# Patient Record
Sex: Male | Born: 1950 | Race: White | Hispanic: No | Marital: Single | Smoking: Former smoker
Health system: Southern US, Community
[De-identification: ages and names within clinical notes are randomized; demographics above are authoritative.]

## PROBLEM LIST (undated history)

## (undated) DIAGNOSIS — R351 Nocturia: Secondary | ICD-10-CM

## (undated) DIAGNOSIS — I712 Thoracic aortic aneurysm, without rupture, unspecified: Secondary | ICD-10-CM

## (undated) DIAGNOSIS — R52 Pain, unspecified: Secondary | ICD-10-CM

## (undated) DIAGNOSIS — H9319 Tinnitus, unspecified ear: Secondary | ICD-10-CM

## (undated) DIAGNOSIS — Z860100 Personal history of colon polyps, unspecified: Secondary | ICD-10-CM

## (undated) DIAGNOSIS — I951 Orthostatic hypotension: Secondary | ICD-10-CM

## (undated) DIAGNOSIS — I480 Paroxysmal atrial fibrillation: Secondary | ICD-10-CM

## (undated) DIAGNOSIS — R413 Other amnesia: Secondary | ICD-10-CM

## (undated) DIAGNOSIS — D649 Anemia, unspecified: Secondary | ICD-10-CM

## (undated) DIAGNOSIS — I959 Hypotension, unspecified: Secondary | ICD-10-CM

## (undated) DIAGNOSIS — M797 Fibromyalgia: Secondary | ICD-10-CM

## (undated) DIAGNOSIS — I350 Nonrheumatic aortic (valve) stenosis: Secondary | ICD-10-CM

## (undated) DIAGNOSIS — R569 Unspecified convulsions: Secondary | ICD-10-CM

## (undated) DIAGNOSIS — E785 Hyperlipidemia, unspecified: Secondary | ICD-10-CM

## (undated) DIAGNOSIS — K219 Gastro-esophageal reflux disease without esophagitis: Secondary | ICD-10-CM

## (undated) DIAGNOSIS — M549 Dorsalgia, unspecified: Secondary | ICD-10-CM

## (undated) DIAGNOSIS — I2510 Atherosclerotic heart disease of native coronary artery without angina pectoris: Secondary | ICD-10-CM

## (undated) DIAGNOSIS — F419 Anxiety disorder, unspecified: Secondary | ICD-10-CM

## (undated) DIAGNOSIS — I739 Peripheral vascular disease, unspecified: Secondary | ICD-10-CM

## (undated) DIAGNOSIS — F329 Major depressive disorder, single episode, unspecified: Secondary | ICD-10-CM

## (undated) DIAGNOSIS — M1990 Unspecified osteoarthritis, unspecified site: Secondary | ICD-10-CM

## (undated) DIAGNOSIS — I639 Cerebral infarction, unspecified: Secondary | ICD-10-CM

## (undated) DIAGNOSIS — G8929 Other chronic pain: Secondary | ICD-10-CM

## (undated) HISTORY — PX: CHOLECYSTECTOMY: SHX55

## (undated) HISTORY — PX: INGUINAL EXPLORATION: SHX1826

## (undated) HISTORY — PX: FEMORAL HERNIA REPAIR: SHX632

## (undated) HISTORY — PX: FINGER CONTRACTURE RELEASE: SHX637

## (undated) HISTORY — DX: Fibromyalgia: M79.7

## (undated) HISTORY — DX: Nonrheumatic aortic (valve) stenosis: I35.0

## (undated) HISTORY — PX: UMBILICAL HERNIA REPAIR: SHX196

## (undated) HISTORY — DX: Hyperlipidemia, unspecified: E78.5

## (undated) HISTORY — DX: Major depressive disorder, single episode, unspecified: F32.9

## (undated) HISTORY — PX: CIRCUMCISION: SUR203

## (undated) HISTORY — DX: Thoracic aortic aneurysm, without rupture, unspecified: I71.20

## (undated) HISTORY — PX: CARDIAC CATHETERIZATION: SHX172

## (undated) HISTORY — PX: APPENDECTOMY: SHX54

## (undated) HISTORY — DX: Tinnitus, unspecified ear: H93.19

## (undated) HISTORY — PX: TONSILLECTOMY: SUR1361

## (undated) HISTORY — DX: Thoracic aortic aneurysm, without rupture: I71.2

## (undated) HISTORY — PX: COLONOSCOPY: SHX174

## (undated) HISTORY — PX: ESOPHAGOGASTRODUODENOSCOPY: SHX1529

## (undated) HISTORY — DX: Atherosclerotic heart disease of native coronary artery without angina pectoris: I25.10

---

## 2000-01-21 ENCOUNTER — Inpatient Hospital Stay (HOSPITAL_COMMUNITY): Admission: RE | Admit: 2000-01-21 | Discharge: 2000-01-22 | Payer: Self-pay | Admitting: Cardiology

## 2005-10-16 ENCOUNTER — Ambulatory Visit: Payer: Self-pay | Admitting: Cardiology

## 2005-10-17 ENCOUNTER — Ambulatory Visit: Payer: Self-pay | Admitting: Cardiology

## 2006-02-05 ENCOUNTER — Ambulatory Visit: Payer: Self-pay | Admitting: Cardiology

## 2006-02-07 ENCOUNTER — Ambulatory Visit: Payer: Self-pay | Admitting: Cardiovascular Disease

## 2006-02-07 ENCOUNTER — Inpatient Hospital Stay (HOSPITAL_BASED_OUTPATIENT_CLINIC_OR_DEPARTMENT_OTHER): Admission: RE | Admit: 2006-02-07 | Discharge: 2006-02-07 | Payer: Self-pay | Admitting: Cardiovascular Disease

## 2006-02-21 ENCOUNTER — Ambulatory Visit: Payer: Self-pay | Admitting: Cardiology

## 2006-04-01 ENCOUNTER — Encounter: Payer: Self-pay | Admitting: Cardiology

## 2006-04-04 ENCOUNTER — Ambulatory Visit: Payer: Self-pay | Admitting: Internal Medicine

## 2006-09-16 ENCOUNTER — Ambulatory Visit: Payer: Self-pay | Admitting: Cardiology

## 2006-11-18 ENCOUNTER — Ambulatory Visit: Payer: Self-pay | Admitting: Cardiology

## 2007-03-09 ENCOUNTER — Ambulatory Visit: Payer: Self-pay | Admitting: Cardiology

## 2007-03-13 ENCOUNTER — Encounter: Payer: Self-pay | Admitting: Cardiology

## 2007-04-15 ENCOUNTER — Encounter: Payer: Self-pay | Admitting: Cardiology

## 2007-07-07 ENCOUNTER — Encounter: Payer: Self-pay | Admitting: Cardiology

## 2007-07-14 ENCOUNTER — Encounter: Payer: Self-pay | Admitting: Cardiology

## 2008-01-11 ENCOUNTER — Ambulatory Visit: Payer: Self-pay | Admitting: Cardiology

## 2008-01-15 ENCOUNTER — Ambulatory Visit: Payer: Self-pay | Admitting: Cardiology

## 2008-02-08 ENCOUNTER — Encounter: Payer: Self-pay | Admitting: Cardiology

## 2008-06-16 ENCOUNTER — Encounter: Payer: Self-pay | Admitting: Cardiology

## 2008-08-30 ENCOUNTER — Ambulatory Visit: Payer: Self-pay | Admitting: Cardiology

## 2008-09-07 ENCOUNTER — Ambulatory Visit (HOSPITAL_COMMUNITY): Admission: RE | Admit: 2008-09-07 | Discharge: 2008-09-07 | Payer: Self-pay | Admitting: Cardiology

## 2008-09-07 ENCOUNTER — Ambulatory Visit: Payer: Self-pay | Admitting: Cardiology

## 2008-09-07 ENCOUNTER — Encounter: Payer: Self-pay | Admitting: Cardiology

## 2008-12-19 DIAGNOSIS — I359 Nonrheumatic aortic valve disorder, unspecified: Secondary | ICD-10-CM | POA: Insufficient documentation

## 2008-12-19 DIAGNOSIS — R011 Cardiac murmur, unspecified: Secondary | ICD-10-CM

## 2008-12-19 DIAGNOSIS — E785 Hyperlipidemia, unspecified: Secondary | ICD-10-CM | POA: Insufficient documentation

## 2008-12-19 DIAGNOSIS — I2510 Atherosclerotic heart disease of native coronary artery without angina pectoris: Secondary | ICD-10-CM

## 2009-01-18 ENCOUNTER — Encounter: Payer: Self-pay | Admitting: Cardiology

## 2009-01-30 ENCOUNTER — Ambulatory Visit: Payer: Self-pay | Admitting: Cardiology

## 2009-01-30 ENCOUNTER — Encounter: Payer: Self-pay | Admitting: Cardiology

## 2009-01-30 ENCOUNTER — Ambulatory Visit (HOSPITAL_COMMUNITY): Admission: RE | Admit: 2009-01-30 | Discharge: 2009-01-30 | Payer: Self-pay | Admitting: Cardiology

## 2009-02-03 ENCOUNTER — Telehealth (INDEPENDENT_AMBULATORY_CARE_PROVIDER_SITE_OTHER): Payer: Self-pay | Admitting: *Deleted

## 2009-02-08 ENCOUNTER — Telehealth (INDEPENDENT_AMBULATORY_CARE_PROVIDER_SITE_OTHER): Payer: Self-pay | Admitting: *Deleted

## 2009-02-15 ENCOUNTER — Ambulatory Visit: Payer: Self-pay | Admitting: Cardiology

## 2009-02-15 ENCOUNTER — Encounter (INDEPENDENT_AMBULATORY_CARE_PROVIDER_SITE_OTHER): Payer: Self-pay | Admitting: *Deleted

## 2009-02-15 DIAGNOSIS — F319 Bipolar disorder, unspecified: Secondary | ICD-10-CM

## 2009-02-28 ENCOUNTER — Ambulatory Visit: Payer: Self-pay | Admitting: Cardiology

## 2009-03-07 ENCOUNTER — Encounter: Payer: Self-pay | Admitting: Cardiology

## 2009-03-16 ENCOUNTER — Ambulatory Visit: Payer: Self-pay | Admitting: Cardiology

## 2009-03-22 ENCOUNTER — Encounter: Payer: Self-pay | Admitting: Cardiology

## 2009-03-22 ENCOUNTER — Encounter (INDEPENDENT_AMBULATORY_CARE_PROVIDER_SITE_OTHER): Payer: Self-pay | Admitting: *Deleted

## 2009-03-23 ENCOUNTER — Ambulatory Visit: Payer: Self-pay | Admitting: Cardiology

## 2009-03-23 ENCOUNTER — Inpatient Hospital Stay (HOSPITAL_BASED_OUTPATIENT_CLINIC_OR_DEPARTMENT_OTHER): Admission: RE | Admit: 2009-03-23 | Discharge: 2009-03-23 | Payer: Self-pay | Admitting: Cardiology

## 2009-03-27 ENCOUNTER — Telehealth (INDEPENDENT_AMBULATORY_CARE_PROVIDER_SITE_OTHER): Payer: Self-pay | Admitting: *Deleted

## 2009-03-28 ENCOUNTER — Ambulatory Visit: Payer: Self-pay | Admitting: Cardiology

## 2009-03-28 DIAGNOSIS — R071 Chest pain on breathing: Secondary | ICD-10-CM

## 2009-03-28 DIAGNOSIS — R0602 Shortness of breath: Secondary | ICD-10-CM

## 2009-06-20 ENCOUNTER — Encounter: Payer: Self-pay | Admitting: Cardiology

## 2009-08-02 ENCOUNTER — Encounter: Payer: Self-pay | Admitting: Cardiology

## 2009-08-11 ENCOUNTER — Encounter: Payer: Self-pay | Admitting: Cardiology

## 2009-09-11 ENCOUNTER — Ambulatory Visit: Payer: Self-pay | Admitting: Cardiology

## 2009-09-11 ENCOUNTER — Encounter: Payer: Self-pay | Admitting: Cardiology

## 2009-09-21 ENCOUNTER — Ambulatory Visit: Payer: Self-pay | Admitting: Cardiology

## 2009-09-21 DIAGNOSIS — K5900 Constipation, unspecified: Secondary | ICD-10-CM | POA: Insufficient documentation

## 2009-09-22 ENCOUNTER — Encounter: Payer: Self-pay | Admitting: Cardiology

## 2010-02-01 ENCOUNTER — Encounter: Payer: Self-pay | Admitting: Physician Assistant

## 2010-02-01 ENCOUNTER — Ambulatory Visit: Payer: Self-pay | Admitting: Cardiology

## 2010-02-01 ENCOUNTER — Telehealth (INDEPENDENT_AMBULATORY_CARE_PROVIDER_SITE_OTHER): Payer: Self-pay | Admitting: *Deleted

## 2010-02-01 ENCOUNTER — Encounter: Payer: Self-pay | Admitting: Cardiology

## 2010-02-02 ENCOUNTER — Encounter: Payer: Self-pay | Admitting: Cardiology

## 2010-02-15 ENCOUNTER — Ambulatory Visit: Payer: Self-pay | Admitting: Physician Assistant

## 2010-02-15 DIAGNOSIS — F329 Major depressive disorder, single episode, unspecified: Secondary | ICD-10-CM | POA: Insufficient documentation

## 2010-03-08 ENCOUNTER — Encounter: Payer: Self-pay | Admitting: Physician Assistant

## 2010-03-12 ENCOUNTER — Encounter (INDEPENDENT_AMBULATORY_CARE_PROVIDER_SITE_OTHER): Payer: Self-pay | Admitting: *Deleted

## 2010-05-01 NOTE — Miscellaneous (Signed)
Summary: 2 D ECHO  Clinical Lists Changes  Orders: Added new Referral order of 2-D Echocardiogram (2D Echo) - Signed 

## 2010-05-01 NOTE — Progress Notes (Signed)
Summary: sob,arm numbness  Phone Note Call from Patient   Summary of Call: c/o SOB, numbness in arms and pain up into neck.  Been feeling bad last several weeks, but getting worse.  Advised pt to go to ED for evaluation.  Patient verbalized understanding.  Hoover Brunette, LPN  February 01, 2010 9:13 AM

## 2010-05-01 NOTE — Assessment & Plan Note (Signed)
Summary: 6 MONTH FU-REV REMINDER VS   Visit Type:  Follow-up Primary Provider:  Donzetta Sprung   History of Present Illness: the patient is a 60 year old male with no significant coronary artery disease but moderate aortic stenosis. Prior filed area has been meshed at 1.27 cm. The patient had a recent echocardiogram showing ejection fraction of 60-65%. He has a bicuspid aortic valve. The mean gradient is 29 mmHg the peak velocity is 3.35 m/s.these values are unchanged from a prior study.  Clinically it does not appear that the patient has symptomatic aortic stenosis. However his symptoms are difficult to sort through because of his mood disorder. He does feel weak and tired. He sleeps a lot during the daytime but not during the night. He states that he gets short of breath fairly easily and occasional takes a nitroglycerin. Most of the time he feels very depressed. His Effexor was recently increased. He also has difficulty with constipation and has been started on lubiprostone without much success.  The patient denies any palpitations presyncope or syncope.  Preventive Screening-Counseling & Management  Alcohol-Tobacco     Smoking Status: current     Smoking Cessation Counseling: yes     Packs/Day: 1/2 PPD  Current Medications (verified): 1)  Alprazolam 1 Mg Tabs (Alprazolam) .... Take 1 Tablet By Mouth Five Times A Day and At Bedtime 2)  Aspirin 81 Mg Tbec (Aspirin) .... Take 1 Tablet By Mouth Once A Day 3)  Protonix 40 Mg Tbec (Pantoprazole Sodium) .... Take 1 Tablet By Mouth Once A Day 4)  Simvastatin 40 Mg Tabs (Simvastatin) .... Take 1 Tablet By Mouth Once A Day 5)  Effexor Xr 150 Mg Xr24h-Cap (Venlafaxine Hcl) .... Take 1 Tablet By Mouth Every Morning 6)  Amitiza 24 Mcg Caps (Lubiprostone) .... Take 1 Tablet By Mouth Two Times A Day 7)  Nitrostat 0.4 Mg Subl (Nitroglycerin) .... Dissolve One Under Tongue As Needed  Chest Pain Every Five Minutes Up To 3 Doses, Proceed To Ed If No  Relief 8)  Miralax  Powd (Polyethylene Glycol 3350) .... Take 17gm in 8oz Juice Daily (Can Buy Over The Counter) 9)  Trazodone Hcl 150 Mg Tabs (Trazodone Hcl) .... Take 1 Tab By Mouth At Bedtime  Allergies (verified): No Known Drug Allergies  Comments:  Nurse/Medical Assistant: The patient's medication bottles and allergies were reviewed with the patient and were updated in the Medication and Allergy Lists.  Past History:  Past Medical History: Last updated: 02/15/2009 AORTIC STENOSIS, CALCIFIC (ICD-424.1) MURMUR (ICD-785.2) HYPERLIPIDEMIA-MIXED (ICD-272.4) CAD, NATIVE VESSEL (ICD-414.01) 1. Nonobstructive coronary artery disease by catheterization. 2. Bicuspid aortic valve with mild-to-moderate aortic stenosis. 3. New murmur at the apex consistent with mitral regurgitation. 4. Dyslipidemia. 5. Major depressive disorder with prior suicidal attempts.    Past Surgical History: Last updated: 12/19/2008 right inguinal herniorrhaphy x2 umbilical herniorrhaphy tendon release operation on his right little finger Appendectomy Cholecystectomy Tonsillectomy  Family History: Last updated: 12/19/2008 Family History of Cancer:  Family History of CVA or Stroke:   Social History: Last updated: 12/19/2008 Disabled  Divorced  Tobacco Use - Yes.  Alcohol Use - yes  Risk Factors: Smoking Status: current (09/21/2009) Packs/Day: 1/2 PPD (09/21/2009)   Vital Signs:  Patient profile:   60 year old male Height:      72 inches Weight:      187 pounds Pulse rate:   99 / minute BP sitting:   91 / 70  (left arm) Cuff size:   regular  Vitals  Entered By: Carlye Grippe (September 21, 2009 10:20 AM)  Physical Exam  Additional Exam:  General: Well-developed, well-nourished in no distress head: Normocephalic and atraumatic eyes PERRLA/EOMI intact, conjunctiva and lids normal nose: No deformity or lesions mouth normal dentition, normal posterior pharynx neck: Supple, no JVD.  No  masses, thyromegaly or abnormal cervical nodes lungs: Normal breath sounds bilaterally without wheezing.  Normal percussion heart: regular rate and rhythm with normal S1 and S2, no S3 or S4.  PMI is normal.  2/6 crescendo decrescendo murmur abdomen: Normal bowel sounds, abdomen is soft and nontender without masses, organomegaly or hernias noted.  No hepatosplenomegaly musculoskeletal: Back normal, normal gait muscle strength and tone normal pulsus: Pulse is normal in all 4 extremities Extremities: No peripheral pitting edema neurologic: Alert and oriented x 3 skin: Intact without lesions or rashes cervical nodes: No significant adenopathy psychologic: Normal affect   Impression & Recommendations:  Problem # 1:  AORTIC STENOSIS, CALCIFIC (ICD-424.1) the patient has moderate aortic stenosis. He will follow up study in 6 months. His updated medication list for this problem includes:    Nitrostat 0.4 Mg Subl (Nitroglycerin) .[name]... Dissolve one under tongue as needed  chest pain every five minutes up to 3 doses, proceed to ed if no relief  Orders: T- * Misc. Laboratory test [phone])  Problem # 2:  CONSTIPATION (ICD-564.00) the patient's constipation is likely in the setting of his mood disorder. He appears to have symptoms of IBS. However it appears that he has never been screened for gluten sensitivity and I ordered an anti-tTG antibody IgA. I also recommended for the patient to take MiraLax 17 g in 8 ounces of juice every day. Orders: T- * Misc. Laboratory test [phone])  Problem # 3:  BIPOLAR DISORDER UNSPECIFIED (ICD-296.80) Assessment: Comment Only  Problem # 4:  HYPERLIPIDEMIA-MIXED (ICD-272.4) Assessment: Comment Only  His updated medication list for this problem includes:    Simvastatin 40 Mg Tabs (Simvastatin) .[name]... Take 1 tablet by mouth once a day  Patient Instructions: 1)  Echo in 6 months 2)  Miralax 17gm in 8 oz juice daily 3)  Trazodone 50mg  at bedtime 4)  Labs 5)   Follow up in  6 months Prescriptions: TRAZODONE HCL 150 MG TABS (TRAZODONE HCL) Take 1 tab by mouth at bedtime  #30 x 1   Entered by:   Hoover Brunette, LPN   Authorized by:   Lewayne Bunting, MD, [facility]   Signed by:   Hoover Brunette, LPN on 14/78/2956   Method used:   Electronically to        [facility] [address] (retail)       [address]       [place], [place]  [address]       Ph: [phone] or [phone]       Fax: [phone]   RxID:   [id number]   Handout requested.

## 2010-05-01 NOTE — Consult Note (Signed)
Summary: CARDIOLOGY CONSULT/[facility]  CARDIOLOGY CONSULT/[facility]   Imported By: Zachary George 02/06/2010 15:01:57  _____________________________________________________________________  External Attachment:    Type:   Image     Comment:   External Document

## 2010-05-01 NOTE — Letter (Signed)
Summary: Letter/ application for handicapped tag  Letter/ application for handicapped tag   Imported By: Dorise Hiss 09/22/2009 16:59:07  _____________________________________________________________________  External Attachment:    Type:   Image     Comment:   External Document

## 2010-05-01 NOTE — Assessment & Plan Note (Signed)
SummaryChauncy [facility] 11/4   Visit Type:  Follow-up Primary Blythe Veach:  Donzetta Sprung   History of Present Illness: patient presents for post hospital followup.  Recently hospitalized here at [facility], earlier this month, with atypical chest pain. Serial cardiac markers all within normal limits. No cardiac work up was recommended, and his symptoms were attributed to severe panic attacks and depression.  Patient denies interim development of exertional angina, significant DOE, or syncope.  Preventive Screening-Counseling & Management  Alcohol-Tobacco     Smoking Status: current     Smoking Cessation Counseling: yes     Packs/Day: 1/2 PPD  Current Medications (verified): 1)  Alprazolam 1 Mg Tabs (Alprazolam) .... Take 1 Tablet By Mouth Five Times A Day 2)  Aspirin 81 Mg Tbec (Aspirin) .... Take 1 Tablet By Mouth Once A Day 3)  Protonix 40 Mg Tbec (Pantoprazole Sodium) .... Take 1 Tablet By Mouth Once A Day 4)  Simvastatin 40 Mg Tabs (Simvastatin) .... Take 1 Tablet By Mouth Once A Day 5)  Effexor Xr 150 Mg Xr24h-Cap (Venlafaxine Hcl) .... Take 1 Tablet By Mouth Every Morning 6)  Nitrostat 0.4 Mg Subl (Nitroglycerin) .... Dissolve One Under Tongue As Needed  Chest Pain Every Five Minutes Up To 3 Doses, Proceed To Ed If No Relief 7)  Miralax  Powd (Polyethylene Glycol 3350) .... Take 17gm in 8oz Juice Daily (Can Buy Over The Counter) 8)  Tramadol Hcl 50 Mg Tabs (Tramadol Hcl) .... Take 1 Tablet By Mouth Three Times A Day As Needed  Allergies (verified): No Known Drug Allergies  Comments:  Nurse/Medical Assistant: The patient's medication bottles and allergies were reviewed with the patient and were updated in the Medication and Allergy Lists.  Past History:  Past Medical History: Last updated: 02/15/2009 AORTIC STENOSIS, CALCIFIC (ICD-424.1) MURMUR (ICD-785.2) HYPERLIPIDEMIA-MIXED (ICD-272.4) CAD, NATIVE VESSEL (ICD-414.01) 1. Nonobstructive coronary artery disease by  catheterization. 2. Bicuspid aortic valve with mild-to-moderate aortic stenosis. 3. New murmur at the apex consistent with mitral regurgitation. 4. Dyslipidemia. 5. Major depressive disorder with prior suicidal attempts.    Review of Systems       No fevers, chills, hemoptysis, dysphagia, melena, hematocheezia, hematuria, rash, claudication, orthopnea, pnd, pedal edema. complains of long-standing tinnitus, diminished hearing, and insomnia. All other systems negative.   Vital Signs:  Patient profile:   60 year old male Height:      72 inches Weight:      190 pounds BMI:     25.86 Pulse rate:   73 / minute BP sitting:   118 / 81  (left arm) Cuff size:   large  Vitals Entered By: Carlye Grippe (February 15, 2010 2:21 PM)  Nutrition Counseling: Patient's BMI is greater than 25 and therefore counseled on weight management options.  Physical Exam  Additional Exam:  GEN: 60 year old male, in no distress HEENT: NCAT,PERRLA,EOMI NECK: palpable pulses, no bruits; no JVD; no TM LUNGS: CTA bilaterally HEART: RRR (S1S2); 2-3/6 systolic ejection murmur with intact S2; no rubs; no gallops ABD: soft, NT; intact BS EXT: intact distal pulses; no edema SKIN: warm, dry MUSC: no obvious deformity NEURO: A/O (x3) , with flat affect, and significant anxiety    Impression & Recommendations:  Problem # 1:  CAD, NATIVE VESSEL (ICD-414.01)  no further workup indicated, following recent presentation with atypical chest pain and normal cardiac markers. Nonobstructive CAD, by previous study, 12/10.  Orders: 2-D Echocardiogram (2D Echo)  Problem # 2:  AORTIC STENOSIS, CALCIFIC (ICD-424.1)  bicuspid aortic  valve with moderate stenosis, by most recent study, 6/11. Recommended followup study this December, per Dr. Andee Lineman, when last seen in the office.  Problem # 3:  HYPERLIPIDEMIA-MIXED (ICD-272.4) Assessment: Comment Only  His updated medication list for this problem includes:     Simvastatin 40 Mg Tabs (Simvastatin) .[name]... Take 1 tablet by mouth once a day  Problem # 4:  DEPRESSION, MAJOR (ICD-296.20)  followed by Dr. [name]  Patient Instructions: 1)  2D Echo 12/11 2)  Follow up in  6 months

## 2010-05-01 NOTE — Letter (Signed)
Summary: External Correspondence/ OFFICE NOTE DR. DANIEL  External Correspondence/ OFFICE NOTE DR. DANIEL   Imported By: Dorise Hiss 06/22/2009 15:34:02  _____________________________________________________________________  External Attachment:    Type:   Image     Comment:   External Document

## 2010-05-01 NOTE — Letter (Signed)
Summary: Discharge [facility]  Discharge [facility]   Imported By: Dorise Hiss 02/07/2010 08:59:09  _____________________________________________________________________  External Attachment:    Type:   Image     Comment:   External Document

## 2010-05-03 NOTE — Letter (Signed)
Summary: Engineer, materials at [facility]  [address]   [place], [place] [address]   Phone: [phone]  Fax: [phone]        March 12, 2010 MRN: [id number]   [address] [place], [place]  [address]   Dear Mr. Stieber,  Your test ordered by Selena Batten has been reviewed by your physician (or physician assistant) and was found to be normal or stable. Your physician (or physician assistant) felt no changes were needed at this time.  __X__ Echocardiogram  ____ Cardiac Stress Test  ____ Lab Work  ____ Peripheral vascular study of arms, legs or neck  ____ CT scan or X-ray  ____ Lung or Breathing test  ____ Other:   Thank you.   Hoover Brunette, LPN    Duane Boston, M.D., F.A.C.C. Thressa Sheller, M.D., F.A.C.C. Oneal Grout, M.D., F.A.C.C. Cheree Ditto, M.D., F.A.C.C. Daiva Nakayama, M.D., F.A.C.C. Kenney Houseman, M.D., F.A.C.C. Jeanne Ivan, PA-C

## 2010-07-02 LAB — POCT I-STAT 3, VENOUS BLOOD GAS (G3P V)
Bicarbonate: 23.2 mEq/L (ref 20.0–24.0)
TCO2: 24 mmol/L (ref 0–100)
pCO2, Ven: 37.3 mmHg — ABNORMAL LOW (ref 45.0–50.0)
pH, Ven: 7.402 — ABNORMAL HIGH (ref 7.250–7.300)

## 2010-07-02 LAB — POCT I-STAT 3, ART BLOOD GAS (G3+)
Acid-base deficit: 1 mmol/L (ref 0.0–2.0)
Bicarbonate: 23.1 mEq/L (ref 20.0–24.0)
pCO2 arterial: 35.5 mmHg (ref 35.0–45.0)
pO2, Arterial: 54 mmHg — ABNORMAL LOW (ref 80.0–100.0)

## 2010-08-14 NOTE — Assessment & Plan Note (Signed)
[facility]                          [name]   [name], [name]                          MRN:          [id number]  DATE:03/09/2007                            DOB:          1950-08-20    REFERRING PHYSICIAN:  Dr. [name]   HISTORY OF PRESENT ILLNESS:  The patient is a 60 year old male with a  history of bicuspid tear aortic valve with mild gradient.  The patient  underwent prior cardiac catheterization and was found to have no  significant coronary artery disease.  The patient, unfortunately,  remains convinced that he has had percutaneous coronary intervention  done.  I have explained this carefully to him that this actually was not  done.  The patient denies any chest pain, orthopnea, PND, he does report  possible symptoms with claudication, he reports lower extremity pain on  exertion and swelling.  He has been evaluated with lower extremity  Doppler by Dr. [name] which was negative for DVT.   MEDICATIONS:  1. Lipitor 40 mg p.o. daily.  2. Xanax 1 mg p.o. q.i.d.  3. Aspirin 81 mg p.o. daily.  4. Prozac 40 mg p.o. daily.  5. Acid reflux pill 40 mg p.o. daily.   PHYSICAL EXAMINATION:  VITAL SIGNS:  Blood pressure 135/93, heart rate  is 83 bpm, weight is 210 pounds.  NECK EXAM:  Normal carotid upstroke, no carotid bruits.  LUNGS:  Clear breath sounds bilaterally.  HEART:  Regular rate and rhythm with normal S1, S2.  No murmur, rubs, or  gallops.  ABDOMEN:  Soft and nontender.  No rebound or guarding, good bowel  sounds.  EXTREMITY EXAM:  No cyanosis, clubbing or edema.  NEURO:  Patient alert, oriented, grossly nonfocal.   PROBLEMS:  1. Bicuspid tear of mild gradient.  2. Normal left ventricular function.  3. No significant coronary artery disease.  4. Tobacco use.  5. Dyslipidemia.  6. Anxiety.  7. Possible psychosis.   PLAN:  1. The patient will follow up with [place] in 6 months.  2. I will send the patient for  arterial Dopplers, although I do not      think he has significant      peripheral vascular disease.  3. I have also told the patient he can use p.r.n. nitroglycerin, if      needed, for chest pain.     Learta Codding, MD,FACC  Electronically Signed    GED/MedQ  DD: 03/10/2007  DT: 03/10/2007  Job #: [id number]   cc:   Donzetta Sprung

## 2010-08-14 NOTE — Assessment & Plan Note (Signed)
[facility]                          [name]   [name], [name]                          MRN:          [id number]  DATE:09/16/2006                            DOB:          26-Apr-1950    HISTORY OF PRESENT ILLNESS:  The patient is a 60 year old male with  history of substernal chest pain.  The patient had a catheterization  which showed nonobstructive coronary artery disease.  He also has mild  aortic stenosis and bicuspid valve.  He has been doing well.  He has no  chest pain nor shortness of breath.  He stated that he has swelling in  his hands and redness in his hands and his hands get sticky after he  washes them.  He denies any palpitations or syncope.   MEDICATIONS:  1. Lipitor 40 mg p.o. daily.  2. Xanax  1 mg q.i.d.  3. Aspirin 81 mg a day.  4. Prozac 40 mg a day.   PHYSICAL EXAMINATION:  VITAL SIGNS:  Blood pressure 136/94, heart rate  73, weight 199 pounds.  NECK:  Normal carotid upstrokes, no carotid bruits.  LUNGS:  Clear breath sounds bilaterally.  HEART:  Regular rate and rhythm, normal S1, S2 with soft systolic  murmur.  ABDOMEN:  Soft.  EXTREMITIES:  No cyanosis, clubbing or edema.  NEUROLOGICAL:  The patient alert, oriented, and grossly nonfocal.   PROBLEMS:  1. Bicuspid aortic valve with mild gradient.  2. Normal LV function.  3. No significant coronary artery disease.  4. Pulmonic regurgitation.  5. Tobaccoism.  6. Dyslipidemia.  7. Anxiety and possible psychosis.   PLAN:  1. The patient will continue on his current therapy.  2. He will get an echocardiogram done in one month to reassess his      aortic stenosis and bicuspid valve.  3. The patient will follow up with [place] in six months.    Learta Codding, MD,FACC  Electronically Signed   GED/MedQ  DD: 09/16/2006  DT: 09/17/2006  Job #: [id number]

## 2010-08-14 NOTE — Assessment & Plan Note (Signed)
[name]                          [name]   [name], [name]                          MRN:          [id number]  DATE:08/30/2008                            DOB:          [birth year]    REFERRING PHYSICIAN:  Dr. [name]   HISTORY OF PRESENT ILLNESS:  The patient is a 60 year old male with a  history of tricuspid aortic valve and mild-to-moderate aortic stenosis.  The patient has nonobstructive coronary artery disease by  catheterization.  The patient states that he is feeling very poorly well  both from a mental and physical aspects.  We discussed his physical  symptoms and his main complaint is dyspnea on mild exertion.  The  patient states that he has fatigued all the time and has no breath on  even slides of activities.  He also is concerned about swelling in his  leg and bruises on his thigh.  I examined the patient's leg and he has  significant varicose veins with venous insufficiency with no definite  evidence of peripheral edema.  The patient states that he has been  getting worse, when [facility] [place] for  major depressive episodes of suicidal attempts with Xanax.   MEDICATIONS:  1. Xanax 1 mg p.o. q.i.d. and at bedtime.  2. Aspirin 81 mg p.o. daily.  3. Effexor XR 150 mg p.o. daily.  4. Protonix 40 mg p.o. daily.  5. Zocor 80 mg p.o. daily.   PHYSICAL EXAMINATION:  VITAL SIGNS:  Blood pressure is 107/79, heart  rate 89, weight 198 pounds.  NECK:  Normal carotid upstroke and no carotid bruits.  LUNGS:  Clear breath sounds bilaterally.  HEART:  Regular rate and rhythm.  Normal S1 and S2.  There is a  crescendo-decrescendo murmur at the left upper sternal border in the  right upper sternal border.  However, there is also holosystolic murmur  at the apex towards the axilla, which is new from his prior exam.  ABDOMEN:  Soft and nontender.  No rebound or guarding.  Good bowel  sounds.  EXTREMITIES:  No cyanosis, clubbing, or edema.  NEURO:  The patient is alert and oriented.  Grossly nonfocal.   PROBLEM LIST:  1. Nonobstructive coronary artery disease by catheterization.  2. Bicuspid aortic valve with mild-to-moderate aortic stenosis.  3. New murmur at the apex consistent with mitral regurgitation.  4. Dyslipidemia.  5. Major depressive disorder with prior suicidal attempts.   PLAN:  1. The patient will need a followup echocardiogram given his new      murmur at the apex, which could be mitral regurgitation.  We will      also rule out aortic stenosis, although I do not think it is      severe.  I think his shortness of breath is still rather      subjective, but there is no evidence of significant heart failure.  2. The patient remains under psychiatric care.  3. The patient has bruising on his thighs that lead to venous  insufficiency and we will then further discuss this with Dr.      [name].     Learta Codding, MD,FACC  Electronically Signed    GED/MedQ  DD: 08/30/2008  DT: 08/31/2008  Job #: [id number]   cc:   Dr. [name]

## 2010-08-14 NOTE — Assessment & Plan Note (Signed)
[facility]                          [name]   [name], [name]                          MRN:          [id number]  DATE:01/11/2008                            DOB:          08-19-1950    CARDIOLOGIST:  Allen Codding, MD,FACC   PRIMARY CARE PHYSICIAN:  [name]   REASON FOR VISIT:  A 77-month followup.   HISTORY OF PRESENT ILLNESS:  [name] is a 60 year old male patient with  a history of bicuspid aortic valve with mild aortic stenosis and  nonobstructive coronary artery disease, who returns to the office today  for followup.  Since he was last seen in the office in December 2008, he  has been admitted to a psychiatric hospital in [place] for major  depressive episode and suicidal attempt with Xanax.  He apparently had a  seizure in the setting of benzodiazepine withdrawal.  This is all per  his report.  He was questioning today about his percutaneous coronary  intervention and severe valvular disease that he had annotated on prior  testing.  I have reviewed his records thoroughly and I was able to  determine that he has never had percutaneous coronary intervention.  He  only has mild aortic stenosis in the setting of a bicuspid aortic valve.  I had a long discussion with the patient today and explained to him the  process of cardiac catheterization and what he may have seen that he  thinks was percutaneous coronary intervention.  I also explained what a  bicuspid aortic valve is to him as well as aortic stenosis.  He has had  chest pain for quite some time.  He has been provided with p.r.n.  nitroglycerin.  This has been stable since we last saw him without  significant change.  He also has chronic dyspnea with exertion.  I am  unable to determine what his functional class is.  This overall has been  stable.  He thinks it could be slightly worse.  He sleeps on a couple of  pillows.  He has done this for about a year.  He  denies paroxysmal  nocturnal dyspnea or pedal edema.  He denies syncope.   CURRENT MEDICATIONS:  1. Lipitor 40 mg daily.  2. Xanax 1 mg four times a day and nightly.  3. Aspirin 81 mg daily.  4. Effexor 75 mg daily.  5. Omeprazole 20 mg daily.   PHYSICAL EXAMINATION:  GENERAL:  He is a well-nourished, well-developed  male.  VITAL SIGNS:  Blood pressure is 124/92, pulse 98, weight 193.4 pounds.  HEENT:  Normal.  NECK:  Without JVD.  CARDIAC:  Normal S1 and S2.  Regular rate and rhythm, 1/6 harsh systolic  ejection murmur heard best at the right sternal border and apex.  LUNGS:  Clear to auscultation bilaterally.  ABDOMEN:  Soft, nontender.  EXTREMITIES:  Without edema.  NEUROLOGIC:  He is alert and oriented x3.  Cranial nerves grossly  intact.  VASCULAR:  Without carotid bruits bilaterally.   ASSESSMENT AND PLAN:  1. Nonobstructive coronary artery disease  by cardiac catheterization      in November 2007.  At that time, the patient had nonobstructive      plaque in the proximal left anterior descending, 30% stenosis of      the mid circumflex, and no significant disease in right coronary      artery.  He has overall preserved left ventricular function.  He      continues to have chest pain off and on.  He has taken p.r.n.      nitroglycerin for this for quite some time.  There has been no      significant change in his chest symptoms.  The etiology of the      symptoms are somewhat unclear to me.  No further workup or      medication changes will be pursued today.  2. Bicuspid aortic valve with mild aortic stenosis.  His last      echocardiogram was in August 2008.  At that time, he had mild      aortic stenosis.  We will set up a relook echocardiogram to      reassess his aortic valve disease.  3. Dyslipidemia.  This is followed up by Dr. Reuel Wright.  He will continue      follow up with a goal LDL of less than or equal to 70.  4. Major depressive disorder with recent suicidal  attempt.  He will      continue follow up with his primary care physician for this.   DISPOSITION:  The patient will be brought back in follow up with Dr.  Andee Wright in the next 6 months or sooner p.r.n.      Allen Newcomer, PA-C  Electronically Signed      Allen Codding, MD,FACC  Electronically Signed   SW/MedQ  DD: 01/11/2008  DT: 01/12/2008  Job #: [id number]   cc:   [name]

## 2010-08-17 NOTE — Cardiovascular Report (Signed)
NAMEKEMOND, AMORIN                   ACCOUNT NO.:  [id number]   MEDICAL RECORD NO.:  [id number]          PATIENT TYPE:  OIB   LOCATION:  1966                         FACILITY:  [facility]   PHYSICIAN:  Veverly Fells. Excell Seltzer, MD  DATE OF BIRTH:  [birth year]   DATE OF PROCEDURE:  DATE OF DISCHARGE:  02/07/2006                              CARDIAC CATHETERIZATION   PROCEDURE:  Left heart catheterization, right heart catheterization,  selective coronary angiography, aortic root angiography.   INDICATION:  Mr. Nadal is a very pleasant 60 year old male with chest pain  and shortness of breath, who had a prior heart cath 6 years ago  demonstrating nonobstructive coronary artery disease.  He also has been  found to have a bicuspid aortic valve and has mild to moderate aortic  stenosis.  He was referred for right and left heart cardiac catheterization  to reassess his coronary anatomy and define his hemodynamics in the setting  of his exertional chest pain and dyspnea.   PROCEDURAL DETAILS:  Risks and indications of the procedure were explained  in detail to the patient. Informed consent was obtained.  The right groin  was prepped, draped, and anesthetized with 1% lidocaine using normal sterile  conditions.  Using a modified Seldinger technique a 7-French venous sheath  was placed on the right femoral vein and a 4-French sheath was placed on the  right femoral artery.  A right heart cath was performed initially and  pressures were recorded throughout the right heart chambers from the right  atrium to the pulmonary capillary wedge position.  Oxygen saturations were  drawn in the superior vena cava, pulmonary artery, and aorta.  Cardiac  outputs were calculated via the thick technique.  Following the right heart  catheterization, selective coronary angiography was performed.  Multiple  angiographic views above the left and right coronary arteries were taken.  For the left coronary artery a 4-French  JL4  catheter was used, for the  right coronary artery a 4-French 3DRC catheter was used.  Following  selective coronary angiography I attempted to cross the aortic valve with a  pigtail catheter and J-tip wire.  This was not successful.  At that point, I  elected to perform an aortic root angiogram to evaluate the aortic root size  and aortic valve morphology.  Following the aortic root angiogram a exchange  length straight tip wire was used and a AL1 catheter to cross the valve.  This wire crossed the valve without difficulty.  Despite the wire in the  left ventricle, I was unable to track a pigtail catheter across it and  ultimately used a multi-purpose catheter to cross into the ventricle.  I  performed a pullback but elected not to perform a left ventriculogram  through the multi-purpose catheter.  Following pullback the case was  completed and the sheaths were pulled with manual pressure used for  hemostasis.   FINDINGS:  Hemodynamics:  Right atrial pressure A-wave 8, V-way 6, mean of  5.  Right ventricular pressure 24/4 with a end-diastolic pressure of 7.  Pulmonary artery pressure 21/10  with a mean of 15.  Pulmonary capillary  wedge pressure A-wave 11, V-wave 9, mean of 8.  Left ventricular pressure  132/6 with a end diastolic pressure of 14.  Aortic pressure 132/83 with a  mean of 104.   Oxygen saturations:  SBC 53%, pulmonary artery 65%, aortic oxygen  saturations 87%.   Cardiac output by the thick methods:  5.9 liters/minute.  Cardiac output is  2.8 liters/minute/metered square.   Coronary angiography:  Left main stem is angiographically normal.  It is a  short segment that bifurcates into the LAD and left circumflex.  The LAD is  a large diameter vessel that courses down to the left ventricular apex,  gives off a large diagonal branch.  There is nonobstructive plaque in the  proximal LAD associated with mild calcification.  The mid distal LAD are  free of any significant  angiographic disease.   Left circumflex is a large diameter vessel.  It gives off a small first  marginal and a medium caliber second marginal branch.  There is a large left  posterolateral branch.  There is a 30% stenosis in the mid circumflex, no  other significant angiographic disease.   The right coronary artery is dominant.  It gives off an RV marginal branch  as well as a conus branch.  Distally, it terminates into a PDA and 2  posterolateral branches.  There is no significant angiographic disease  through the right coronary artery.   Aortic root angiogram demonstrated a heavily calcified aortic valve with a  domed appearance consistent with a bicuspid aortic valve.  There is no  aortic insufficiency.  The aortic root size is grossly normal.   ASSESSMENT:  1. Nonobstructive coronary artery disease.  2. Heavily calcified aortic valve without significant aortic stenosis.  3. Normal right heart hemodynamics.   PLAN:  It appears that Mr. Catterton does not have any obstructive coronary  artery disease nor does he have abnormal hemodynamics or a significant  gradient across his aortic valve.  I would suggest continued medical therapy  for his cardiovascular disease.      Veverly Fells. Excell Seltzer, MD  Electronically Signed     MDC/MEDQ  D:  02/07/2006  T:  02/08/2006  Job:  [id number]   cc:   Learta Codding, MD,FACC

## 2010-08-17 NOTE — Assessment & Plan Note (Signed)
[facility]                            [name]   [name], [name]                            MRN:          [id number]  DATE:10/17/2005                            DOB:          15-Jul-1950    CARDIOLOGY CONSULTATION NOTE:   REFERRING PHYSICIAN:  Donzetta Sprung, MD   PRIMARY CARDIOLOGIST:  Learta Codding, MD, [facility].   REASON FOR CONSULTATION:  [name] is a 60 year old male, with a history of  nonobstructive coronary artery disease by cardiac catheterization in October  2001, following consultation by Dr. Andee Lineman for evaluation of chest pain  radiating to the jaw here at [facility].  Despite the finding of  nonobstructive CAD, it was felt that the patient's symptoms certainly  sounded like ischemia and that this may have been due either to plaque  rupture with distal embolization vs. vasospasm.  Patient was treated  medically.   Patient now presents following recent studies consisting of both a 2D  echocardiogram and an Adenosine stress test.  Patient was found to have a  murmur on recent examination and echocardiography, reviewed by Dr. Andee Lineman,  revealed apparent bicuspid aortic valve with mild/moderate aortic stenosis  (peak gradient 31 mmHg); mean gradient 60 mmHg; normal LV size/function (EF  60%); moderate pulmonic regurgitation.   An Adenosine stress test was done, given his history of coronary disease and  complaint of chest pain, and this was notable for complaint of chest and  associated jaw tightness during perfusion, but with no evidence of ischemia  on imaging and preserved LV function (52%).   Patient is a difficult historian and is being treated for  anxiety/depression.  He has a significantly flat affect and seems to suggest  that he has been having chronic, recurrent exertional chest discomfort since  his catheterization.  He suggests that this has not become more frequent or  more intense in the recent past.   However, he also complains of some  parasternal chest discomfort which has developed over these past two weeks,  but which is not strictly correlated with exertion.  Moreover, this was not  the same kind of discomfort that he experienced during yesterday's stress  test.   Patient does report some associated radiation to the jaw and a sensation of  fullness in the lower neck region.   Patient reports taking nitroglycerin on occasion, when his chest discomfort  is not relieved with rest, and reporting relief after 1-2 tablets.  Again,  however, he does not suggest that he has been increasing the frequency with  which he is needing nitroglycerin.   Patient also reports chronic exertional dyspnea with no recent development  of paroxysmal nocturnal dyspnea or orthopnea.  He has occasional lower  extremity edema.  Patient also denies any recent episodes of  presyncope/syncope.   ALLERGIES:  NO KNOWN DRUG ALLERGIES.   CURRENT MEDICATIONS:  1.  Lipitor 40 mg daily.  2.  Prozac 20 mg daily.  3.  Xanax 1 mg q.i.d.  4.  Aspirin 81 mg daily.  5.  Ambien 10  mg q.h.s.   PAST MEDICAL HISTORY:  1.  Nonobstructive coronary artery disease.      1.  A 40% proximal and mid LAD; 40% mid and distal CFX; 40% proximal and          mid RCA stenosis by cardiac catheterization October 2001.      2.  Normal left ventricular function.  2.  Hyperlipidemia.  3.  Longstanding tobacco smoking.  4.  Anxiety/depression.  5.  Status post cholecystectomy.  6.  Chronic lower back pain.  7.  Spinal stenosis.  8.  Status post hernia repair.   SOCIAL HISTORY:  Patient lives alone here in [place].  He has two children.  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  He has an at  least 35 pack year history of tobacco smoking.  He drinks alcohol on  occasion.   FAMILY HISTORY:  Both parents deceased.  No known history of heart  disease.   REVIEW OF SYSTEMS:  Notable for constipation but no evidence of overt  bleeding.  Otherwise, as noted per HPI.  Remaining systems negative.   PHYSICAL EXAMINATION:  VITAL SIGNS:  Blood pressure 104/72, pulse 71,  regular, weight 200.  GENERAL:  A 60 year old male, with significantly flat affect, but in no  apparent distress.  HEENT:  Normocephalic, atraumatic.  NECK:  Palpable carotid pulses without bruits.  LUNGS:  Diminished breath sounds in bases but without crackles or wheezes.  HEART:  Regular rate and rhythm (S1, S2), crisp S2.  Crescendo-decrescendo  murmur 2-3/6 with radiation to the biclavicular region.  No diastolic murmur  appreciated.  ABDOMEN:  Soft, nontender with intact bowel sounds and no bruits.  EXTREMITIES:  Palpable femoral pulses without bruits; intact distal pulses  without edema.  NEURO:  Flat affect, but with no focal deficit.   IMPRESSION:  1.  Recurrent angina pectoris.      1.  Typical/atypical features.      2.  Nonobstructive coronary artery disease by cardiac catheterization          2001.      3.  Recent non-ischemic Adenosine Cardiolite with associated chest/jaw          pain during effusion.  2.  Aortic stenosis.      1.  Mild/moderate by recent echocardiogram.  3.  Normal left ventricular function.  4.  Longstanding tobacco smoking.  5.  Hyperlipidemia.  6.  Anxiety/depression.   PLAN:  Recommendation is to proceed with a repeat cardiac catheterization  which will be performed as a right/left study, in the [facility], to exclude  coronary artery disease progression as well as to assess the severity of the  aortic stenosis.  Arrangements will be made for this to be set up in our [facility] early next week.  Blood work will be drawn here in the office.  Risks/benefits of the procedure have been discussed and patient is agreeable  to proceed.                                  [name], PA-C   GS/MedQ  DD:  10/17/2005  DT:   10/17/2005  Job #:  [id number]   cc:   Donzetta Sprung

## 2010-08-17 NOTE — Assessment & Plan Note (Signed)
[facility]                            [name]   [name], [name] [name]                   MRN:          [id number]  DATE:02/05/2006                            DOB:          [birth year]    REFERRING PHYSICIAN:  Donzetta Sprung   HISTORY OF PRESENT ILLNESS:  Patient is a 60 year old male who was seen in  the office on October 17, 2005 by [name].  Patient reported at that time  chest pain brought on at rest and exertion.  It was felt that his symptoms  had both demonstrated typical and atypical features for angina.  The patient  has nonobstructive coronary artery disease by catheterization in 2001.  It  was felt that in 2001, the patient presented with a non-ST elevation  myocardial infarction with possible plaque rupture versus vasospasm.  The  patient more recently had an echocardiographic study done, which  demonstrated mild-to-moderate aortic stenosis with a bicuspid aortic valve  and a peak gradient of 60 mmHg and a mean gradient of 31 mmHg.  The LV  function was normal.  The patient also had moderate pulmonic regurgitation.  An Adenosine stress study was done, which demonstrated no definite defects,  but the patient had significant chest and associated jaw tightness.  When he  was seen by Gene in the office on October 17, 2005, he reported chest pain, as  outlined above.  He felt his pain was becoming more dense with some  radiation to the jaw, a sensation of fullness in the lower neck area.  Patient reports taking nitroglycerin on occasion and reports relief after 1-  2 tablets.  The patient was supposed to have a cardiac catheterization done  in July, but the patient canceled this procedure, and he now presents to the  office and is requesting to be rescheduled for his catheterization.   ALLERGIES:  No known drug allergies.   MEDICATIONS:  1. Lipitor 40 mg a day.  2. Xanax 1 mg p.o. q.i.d.  3. Aspirin 81 mg a day.  4.  Prozac 40 mg p.o. daily.   PAST MEDICAL HISTORY:  See the problem list below.   PHYSICAL EXAMINATION:  VITAL SIGNS:  Blood pressure 116/70, heart rate 80.  Weight 198 pounds.  NECK:  Normal carotid upstrokes.  No carotid bruits.  LUNGS:  Clear breath sounds bilaterally.  HEART:  Regular rate and rhythm.  Normal S1 and S2.  No murmurs, rubs or  gallops.  ABDOMEN:  Soft.  EXTREMITIES:  No clubbing, cyanosis or edema.  NEURO:  Patient is alert, oriented, grossly nonfocal.   PROBLEM LIST:  1. Recurrent angina pectoris.      a.     Typical and atypical features.      b.     Nonobstructive coronary artery disease by catheterization in       2001.      c.     Nonischemic Cardiolite study in July, 2007 but with chest and       jaw pain during drug infusion.  2. Aortic stenosis (mild-to-moderate).  a.     Probable bicuspid aortic valve.  3. Normal left ventricular function.  4. Pulmonic regurgitation.  5. Longstanding tobacco use.  6. Dyslipidemia.  7. Anxiety and depression.   PLAN:  1. The patient was scheduled previously for a cardiac catheterization but      cancelled this procedure.  He now reports ongoing substernal chest pain      and wants to be rescheduled for his catheterization.  2. I have made arrangements for the patient to be scheduled as an      outpatient in the [facility] on Friday.  Although his chest pain has      typical features, predominant features are atypical, and I doubt the      patient has significant coronary artery disease.  However, he has      ongoing risk factors and further evaluation appears to be indicated.  I      discussed the risks and benefits with the patient, and he is willing to      proceed.     Learta Codding, MD,FACC  Electronically Signed    GED/MedQ  DD: 02/05/2006  DT: 02/05/2006  Job #: [id number]   cc:   Rosanna Randy

## 2010-08-17 NOTE — Cardiovascular Report (Signed)
[facility]  Patient:    [name], [name]                        MRN: [id number] Proc. Date: 01/21/00 Adm. Date:  [id number] Disc. Date: [id number] Attending:  Learta Codding CC:         Donzetta Sprung, M.D.  Lewayne Bunting, M.D.  Cardiopulmonary Laboratory   Cardiac Catheterization  CLINICAL HISTORY:  Mr. [name] is 60 years old and has multiple risks factors of coronary disease including hyperlipidemia and tobacco use.  He was recently admitted to [facility] with substernal chest pain radiating to his jaw which was very suggestive of ischemia pain.  He was seen in consultation by Dr. Andee Lineman and transferred here for evaluation of catheterization.  DESCRIPTION OF PROCEDURE:  The procedure was performed via the right femoral artery using an arterial sheath and 6 French preformed coronary catheters.  A front wall arterial puncture was performed and Omnipaque contrast was used.  A distal aortogram was performed to rule out abdominal aortic aneurysm.  The patient tolerated the procedure well and left the laboratory in satisfactory condition.  RESULTS:  The left main coronary artery:  The left main coronary artery was free of significant disease.  Left anterior descending:  The left anterior descending artery was irregular and there was 40% narrowing proximal in the first septal perforator and another 40% narrowing after the first septal perforator and first diagonal branch.  They give rise to two septal perforators and a first large diagonal branch.  This vessel was free of significant disease.  Circumflex artery:  The circumflex artery gave rise to a very small caliber marginal branch, a larger marginal branch, a third marginal branch and a posterolateral branch.  There was 90% stenosis in the first marginal branch and this is a very small caliber vessel and fairly short vessel as well. There is 40% narrowing in the mid circumflex and 50% narrowing in  the distal circumflex artery.  Right coronary artery:  The right coronary is a moderate sized vessel that gave rise to a right ventricular branch, a posterior descending branch, and three posterolateral branches.  There was 40% proximal and 40% mid stenosis in the right coronary artery.  LEFT VENTRICULOGRAPHY:  The left ventriculogram performed in the RAO projection showed good wall motion with no areas of hypokinesis.  The estimated ejection fraction was 60%.  CONCLUSIONS:  Nonobstructive coronary artery disease with 40% proximal and mid stenosis in the left anterior descending artery, 40% mid and distal stenosis in the circumflex artery and 40% proximal and mid stenosis in the right coronary artery with normal left ventricular wall motion.  RECOMMENDATIONS:  The patient has nonobstructive coronary disease, although his initial symptoms sounded very much like ischemia.  I suspect his symptoms were ischemic and due either to plaque rupture with distal embolization or a spasm which we cannot appreciate on the angiogram.  I discussed the findings with Dr. Andee Lineman and we will plan to treat him for an acute coronary syndrome but probably will be able to discharge him tomorrow on medical therapy. DD:  01/21/00 TD:  01/22/00 Job: [id number]

## 2010-08-17 NOTE — Consult Note (Signed)
NAMEAMIL, BOUWMAN                   ACCOUNT NO.:  [id number]   MEDICAL RECORD NO.:  [id number]           PATIENT TYPE:   LOCATION:  NA                             FACILITY:   PHYSICIAN:  Lionel December, M.D.    DATE OF BIRTH:  February 13, 1951   DATE OF CONSULTATION:  04/04/2006  DATE OF DISCHARGE:                                 CONSULTATION   PRESENTING COMPLAINT:  Constipation and hematochezia.   HISTORY OF PRESENT ILLNESS:  [name] is 60 year old Caucasian male who is  referred through courtesy of Dr. Donzetta Sprung GI evaluation.  He  presents with one year history of constipation.  He is having 2-3 bowel  movements per week.  All he passes is bits and pieces and balls.  He has  been taking one FiberChoice daily but cannot tell any difference.  He  also complains of intermittent hematochezia which is usually in the form  of blood on the tissue.  He complains of burning pain across his lower  abdomen and sharp pain under both rib cages which occurs frequently if  not daily and resolves spontaneously.  He is not sure whether this pain  gets worse with meals or is relieved with bowel movement.  He has a very  good appetite.  He states he has gained 26 pounds in one year.  He says  he just does not feel well.  He complains of extreme weakness and  fatigue.  He also complains of arthralgias and myalgias. He says some  days he just cannot even walk.  He denies heartburn, nausea, vomiting or  dysphagia.   Savvas's last colonoscopy was in April 1999.  He had two small polyps  removed and they were both hypoplastic.  At that time, he had bloody  diarrhea and he was complaining of abdominal pain.   MEDICATIONS:  He is on Lipitor 80 mg daily, Prozac 40 mg daily, ASA 1 mg  daily, Xanax 1 mg q.i.d., FiberChoice 1 tablet daily.   PAST MEDICAL HISTORY:  He has nonobstructive coronary artery disease.  He had initial cardiac cath in 2001 and he had cath in November 2007 and  he was noted to have  noncritical disease.  He is also noted to have  immobile aortic valve which was felt to be probably bicuspid.  He has  hyperlipidemia.  History of depression and anxiety for four years.  He  has chronic low back pain as well as pain in his hip joints and legs.  He had a CVA seven years ago and he had another other episode four years  ago resulting in diplopia.  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   PAST SURGICAL HISTORY:  Appendectomy at age 57, right inguinal  herniorrhaphy x2 and umbilical herniorrhaphy, tonsillectomy at age 29.  He a had tendon release operation on his right little finger years ago  with partial benefit.  He had a cholecystectomy two years ago.   ALLERGIES:  No known drug allergies.   FAMILY HISTORY:  Both parents are deceased.  Mother died at 31 and  father at 23 of CVA.  He has a sister in good health and a brother who  died of leukemia at age 37 ten years ago.   SOCIAL HISTORY:  He is divorced, first marriage lasted 13 years and  second one 3 years. He has two children.  His son is in good health.  His daughter has thyroid problems. Naythen worked at [employer] for 13  years.  He has been now disabled for four years.  He has been smoking  cigarettes for over 40 years, half to one pack per day and now trying to  quit, down to half a pack a day.  He has not had any alcohol in two  months but prior to that, he would drink beer occasionally.   PHYSICAL EXAMINATION:  GENERAL:  Pleasant mildly obese Caucasian male  who is in no acute distress.  VITAL SIGNS:  He weighs 203 pounds, he is 6 feet tall.  Pulse 72 per  minute, blood pressure 106/74, temperature is 98.3.  HEENT:  Conjunctivae is pink.  Sclerae is nonicteric.  Oral pharyngeal  mucosa is normal.  No neck masses or thyromegaly noted.  HEART:  Cardiac  exam with regular rhythm.  Normal S1 and S2.  No murmur appreciated.  LUNGS:  Clear to auscultation.  ABDOMEN:  Protuberant, bowel sounds are normal. On palpation, is  soft  and nontender without organomegaly or masses.  RECTAL:  Examination reveals a few fecal balls, stool is guaiac  negative.  EXTREMITIES:  No peripheral edema or clubbing noted.   LABORATORY DATA:  Labs from December 2007 reveal bilirubin 0.3, ALP 97,  AST 29, ALT 49, albumin 4.1, total protein 6.9, calcium 8.9, glucose was  80, BUN 12, creatinine 1.1.   ASSESSMENT:  Ceasar is 60 year old Caucasian male with a one year history  of constipation and intermittent hematochezia.  His last colonoscopy was  in April 1999.  I suspect his hematochezia is secondary hemorrhoids but  since he has noted change in his bowel habits, colonoscopy is warranted  rather than waiting another two years.  It is possible that his  constipation is secondary to lack of activity and his medicines.  He has  multiple symptoms including arthralgias and myalgias.  Dr. Rosann Auerbach note  indicates checking a TSH on his next visit unless he has had one  recently. I would consider checking his TSH and also check a total CPK  to make sure he is not having any muscle injury secondary to Lipitor.   History of aortic bicuspid valve.  According to the recommendations by  American Heart Association published in the circulation Aug 06, 2005, he  would not need SBE prophylaxis, however, I would just check with Dr.  Andee Lineman before the procedure.  Unless he has had recent TSH and CPK, we  will check that prior to the colonoscopy.   RECOMMENDATIONS:  1. Colonoscopy to be performed at [facility] in the near future.  Unless he      is has had recent CPK and TSH, will check at that time.  2. The patient will hold his ASA for two days prior to the procedure.  3. The patient advised to increase his FiberChoice to two tablets      daily and he will start MiraLax 17 grams daily.   We appreciate the opportunity to participate in the care of this  gentleman.      Lionel December, M.D.  Electronically Signed     NR/MEDQ  D:  04/04/2006   T:  04/04/2006  Job:  [id number]   cc:   Donzetta Sprung  Fax: [phone]

## 2010-08-17 NOTE — Assessment & Plan Note (Signed)
[facility]                            [name]   [name], [name]                      MRN:          [id number]  DATE:02/21/2006                            DOB:          1950/10/03    HISTORY OF PRESENT ILLNESS:  The patient is a 60 year old male with a  history of substernal chest pain.  The patient was referred for a  catheterization.  He was found to have nonobstructive coronary artery  disease.  He does have non-aortic stenosis likely secondary to bicuspid  valve.  He has mild to moderate aortic stenosis.  He presents for follow up  today.  He still complains of chest pain which appears to be musculoskeletal  in nature.   MEDICATIONS:  1. Lipitor 40 mg a day.  2. Xanax.  3. Aspirin.  4. Prozac.   PHYSICAL EXAMINATION:  VITAL SIGNS:  Blood pressure is 120/70 heart rate is  80 b.p.m.  NECK:  Normal carotid upstrokes, no bruits.  LUNGS:  Clear.  HEART:  Regular rate and rhythm.  ABDOMEN:  Soft.  GROIN:  Reveals bruising in the right groin but no bruit and no significant  swelling.  NEURO:  The patient is alert, oriented, grossly nonfocal.   PROBLEM LIST:  1. Chest pain, noncardiac, no obstructive disease by catheterization      recently.  2. Aortic stenosis mild to moderate.  3. Normal left ventricular function.  4. Pulmonic regurgitation.  5. Tobacco use.  6. Dyslipidemia.  7. Anxiety and depression.   PLAN:  1. The patient's chest pain appears to be musculoskeletal and it even may      be originating from his spine.  I have asked him to see his primary      care physician to follow up with this.  2. Catheterization.  Shows nonobstructive disease and is essentially near      normal.  3. The patient does have bicuspid aortic valve and we will obtain an      echocardiographic study and follow up in 1 year.     Learta Codding, MD,FACC  Electronically Signed   GED/MedQ  DD: 02/21/2006  DT: 02/21/2006  Job  #: [id number]

## 2010-08-17 NOTE — Discharge Summary (Signed)
[place]  Patient:    Allen, [name]                        MRN: [id number] Adm. Date:  [id number] Disc. Date: [id number] Attending:  Learta Codding Dictator:   Lavella Hammock, P.A.-C. CC:         [facility] - [address], [place], [place] [address]             Donzetta Sprung, M.D. - [place], [place]                           Discharge Summary  DATE OF BIRTH:  Apr 27, 1950  PROCEDURE: 1. Cardiac catheterization. 2. Coronary arteriogram. 3. Left ventriculogram.  HISTORY OF PRESENT ILLNESS:  Mr. Cato is a 60 year old male with no known history of coronary artery disease,who was seen at [facility] after having gone there for chest pain.  HOSPITAL COURSE:  It was felt that his chest pain was possibly secondary to angina, and he was transferred to [facility] for further evaluation and cardiac catheterization.  He had a heart catheterization on January 21, 2000, that showed a 40% proximal andmid-LAD, a 40% mid, and a 50% distal circumflex, and a 90% OM-I which was a small vessel. He had 40% proximal and 40% mid-RCA lesions.  His ejection fraction was normal at 60%. It was felt that medical therapy was recommended.  He did well overnight and the next day had no more chest pressure, and no shortness of breath.  His groin was stable without bruit, hematoma, or ecchymosis.  His lipid profile had been drawn and the results are pending at the time of dictation.  DISPOSITION:  Because he had no further episodes of chest pain, and he was to be seen by cardiac rehabilitation, to re-emphasize risk factor modification, and after that he is considered stable for discharge on January 22, 2000.  LABORATORY DATA:  Lipid profile pending at the time of dictation.  CONDITION ON DISCHARGE:  Stable.  CONSULTATION:  None.  COMPLICATIONS:  None.  DISCHARGE DIAGNOSES: 1. Coronary artery disease, no myocardial infarction this  admission,    medical therapy recommended. 2. Hyperlipidemia, research to follow up for possible inclusion in    study. 3. Ongoing tobacco use. 4. History of ischemic colitis. 5. History of a hernia repair x 2. 6. Possible chronic obstructive pulmonary disease. 7.Family history of atherosclerotic peripheral vascular disease.  DISCHARGE INSTRUCTIONS: 1. His activity level is to include no driving, no sexual or strenuous    activity for two days. 2. He may return to work onMonday. 3. He is to stick to a low-fat diet of 60 g per day. 4. He isto call the office for bleeding, swelling, or drainage at the    catheterization site. 5. He is not to use tobacco. 6. He is to follow up with Dr. Lewayne Bunting on February 12, 2000, at 11:15 a.m.,    or sooner p.r.n. 7. He is to follow up with Dr. Donzetta Sprung as needed.  DISCHARGEMEDICATIONS: 1. Coated aspirin 325 mg q.d. 2. Nitroglycerin 0.4 mg sublingual p.r.n. 3. Lipitor 10 mg q.d. 4. Toprol XL 25 mg q.d. DD:  01/22/00 TD:  01/22/00 Job: [id number]

## 2010-09-13 ENCOUNTER — Encounter: Payer: Self-pay | Admitting: Cardiology

## 2010-10-24 ENCOUNTER — Encounter: Payer: Self-pay | Admitting: Cardiology

## 2010-10-24 ENCOUNTER — Ambulatory Visit: Payer: Self-pay | Admitting: Cardiology

## 2010-10-24 ENCOUNTER — Ambulatory Visit (INDEPENDENT_AMBULATORY_CARE_PROVIDER_SITE_OTHER): Payer: PRIVATE HEALTH INSURANCE | Admitting: Cardiology

## 2010-10-24 VITALS — BP 127/85 | HR 71 | Resp 18 | Ht 72.0 in | Wt 197.4 lb

## 2010-10-24 DIAGNOSIS — I350 Nonrheumatic aortic (valve) stenosis: Secondary | ICD-10-CM | POA: Insufficient documentation

## 2010-10-24 DIAGNOSIS — I340 Nonrheumatic mitral (valve) insufficiency: Secondary | ICD-10-CM

## 2010-10-24 DIAGNOSIS — F329 Major depressive disorder, single episode, unspecified: Secondary | ICD-10-CM | POA: Insufficient documentation

## 2010-10-24 DIAGNOSIS — R071 Chest pain on breathing: Secondary | ICD-10-CM

## 2010-10-24 DIAGNOSIS — I359 Nonrheumatic aortic valve disorder, unspecified: Secondary | ICD-10-CM

## 2010-10-24 DIAGNOSIS — I2510 Atherosclerotic heart disease of native coronary artery without angina pectoris: Secondary | ICD-10-CM

## 2010-10-24 DIAGNOSIS — I059 Rheumatic mitral valve disease, unspecified: Secondary | ICD-10-CM

## 2010-10-24 NOTE — Progress Notes (Signed)
HPI The patient is a 60 year old male with a history of bicuspid aortic valve, moderate aortic stenosis with a mean gradient of 34 mm of mercury by echocardiogram February 2012. Had prior cardiac catheterization with nonobstructive CAD. He states any shortness of breath has worsened. He gets short of breath on minimal exertion. He denies any chest pain. He denies any presyncope or syncope. Has significant problems with tinnitus. He has seen an ear nose throat specialist. He also continues to struggle with depression. The patient also complains of varicose veins associated with some mild nonpitting edema in the lower extremities.  No Known Allergies  Current Outpatient Prescriptions on File Prior to Visit  Medication Sig Dispense Refill  . ALPRAZolam (XANAX) 1 MG tablet Take 1 mg by mouth 5 (five) times daily.        [name] aspirin 81 MG tablet Take 81 mg by mouth daily.        . nitroGLYCERIN (NITROSTAT) 0.4 MG SL tablet Place 0.4 mg under the tongue every 5 (five) minutes as needed.        . pantoprazole (PROTONIX) 40 MG tablet Take 40 mg by mouth daily.        . simvastatin (ZOCOR) 40 MG tablet Take 40 mg by mouth at bedtime.        . traMADol (ULTRAM) 50 MG tablet Take 50 mg by mouth every 8 (eight) hours as needed.        . venlafaxine (EFFEXOR-XR) 150 MG 24 hr capsule Take 150 mg by mouth every morning.        . polyethylene glycol powder (MIRALAX) powder Take 17 g by mouth daily.          Past Medical History  Diagnosis Date  . Murmur     Trace mitral regurgitation by echocardiogram February 2012  . Hyperlipidemia     mixed  . Mitral regurgitation     New murmur at the apex consistent with mitral regurgitation  . Aortic stenosis     Bicuspid aortic valve with mild-to-moderate aortic stenosis echocardiogram February 2012 mean gradient 34 mmHg, aortic valve area 0.7 cm, peak velocity 2.43 m/s  . CAD (coronary artery disease)     native vessel, nonobstructive, by catheterization  .  Dyslipidemia   . 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 Inguinal exploration     right  . Femoral hernia repair     x2  . Umbilical hernia repair   . Finger contracture release     Tendon release operation on his right little finger  . Appendectomy   . Cholecystectomy   . Tonsillectomy     Family History  Problem Relation Age of Onset  . Cancer    . Stroke      History   Social History  . Marital Status: Single    Spouse Name: N/A    Number of Children: N/A  . Years of Education: N/A   Occupational History  . Disabled    Social History Main Topics  . Smoking status: Smoker, Current Status Unknown  . Smokeless tobacco: Not on file  . Alcohol Use: Yes  . Drug Use: Not on file  . Sexually Active: Not on file   Other Topics Concern  . Not on file   Social History Narrative  . No narrative on file   ZOX:[id number] positives as outlined above. The remainder of the 18  point  review of systems is negative  PHYSICAL EXAM BP 127/85  Pulse 71  Resp 18  Ht 6' (1.829 m)  Wt 197 lb 6.4 oz (89.54 kg)  BMI 26.77 kg/m2  SpO2 97%  General: Well-developed, well-nourished in no distress Head: Normocephalic and atraumatic Eyes:PERRLA/EOMI intact, conjunctiva and lids normal Ears: No deformity or lesions Mouth:normal dentition, normal posterior pharynx Neck: Supple, no JVD.  No masses, thyromegaly or abnormal cervical nodes Lungs: Normal breath sounds bilaterally without wheezing.  Normal percussion Cardiac: regular rate and rhythm with normal S1 and S2, no S3 or S4.  PMI is normal.  3/6 crescendo decrescendo murmur late peaking normal S2 is in intensity, 2-3/6 murmur at the apex possibly radiating from the aorta. Questionable Gallivardin sign.  Abdomen: Normal bowel sounds, abdomen is soft and nontender without masses, organomegaly or hernias noted.  No hepatosplenomegaly MSK: Back normal, normal gait muscle  strength and tone normal Vascular: Pulse is normal in all 4 extremities Extremities: No peripheral pitting edema Neurologic: Alert and oriented x 3 Skin: Intact without lesions or rashes Lymphatics: No significant adenopathy Psychologic: Normal affect  ECG: Normal sinus rhythm right bundle branch block. Otherwise no acute changes.  ASSESSMENT AND PLAN

## 2010-10-24 NOTE — Assessment & Plan Note (Signed)
No significant mitral regurgitation by prior echocardiogram

## 2010-10-24 NOTE — Assessment & Plan Note (Signed)
No recurrent chest pain continue medical therapy 

## 2010-10-24 NOTE — Assessment & Plan Note (Signed)
We will followup with a repeat echocardiogram. The patient has known mild to moderate aortic stenosis. Based on the physical examination I still do not think is severe. Continue to monitor her aortic stenosis which is moderate with a mean gradient of 34 mm of mercury. We'll repeat an echocardiogram. It is hard to quantify the patient's symptoms because they're clouded by his depression and poor exercise tolerance and lack of interest in physical activity. Depending whether there is an increase in the mean gradient or aortic valve velocity we'll consider exercise treadmill test.

## 2010-10-24 NOTE — Patient Instructions (Signed)
Your physician has requested that you have an echocardiogram. Echocardiography is a painless test that uses sound waves to create images of your heart. It provides your doctor with information about the size and shape of your heart and how well your heart's chambers and valves are working. This procedure takes approximately one hour. There are no restrictions for this procedure. If the results of your test are normal or stable, you will receive a letter.  If they are abnormal, the nurse will contact you by phone. Your physician wants you to follow up in: 6 months.  You will receive a reminder letter in the mail one-two months in advance.  If you don't receive a letter, please call our office to schedule the follow up appointment  

## 2010-10-25 ENCOUNTER — Other Ambulatory Visit (INDEPENDENT_AMBULATORY_CARE_PROVIDER_SITE_OTHER): Payer: PRIVATE HEALTH INSURANCE | Admitting: *Deleted

## 2010-10-25 DIAGNOSIS — I2510 Atherosclerotic heart disease of native coronary artery without angina pectoris: Secondary | ICD-10-CM

## 2010-10-25 DIAGNOSIS — I359 Nonrheumatic aortic valve disorder, unspecified: Secondary | ICD-10-CM

## 2010-10-25 DIAGNOSIS — I350 Nonrheumatic aortic (valve) stenosis: Secondary | ICD-10-CM

## 2011-04-25 NOTE — H&P (Signed)
NTS SOAP Note  Vital Signs:  Vitals as of: 04/25/2011: Systolic 128: Diastolic 86: Heart Rate 99: Temp 24F: Height 49ft 0in: Weight 205Lbs 0 Ounces: Pain Level 5: BMI 28  BMI : 27.8 kg/m2  Subjective: This 61 Years 37 Months old Male presents for of  HERNIA: ,around umbilicus.  States that this was repaired previously, but recurred after having lap chole at [facility].  Is made worse with straining.  No nausea, vomiting.  Does not know about the TCS request.  Review of Symptoms:  Constitutional:fever, dizzy spells, tired Eyes:unremarkable Nose/Mouth/Throat:unremarkable no chest pain Respiratory:dyspnea Gastrointestinabdominal pain,nausea Genitourinary:dysuria,urgency,frequency joint and back pain dry Hematolgic/Lymphatic:unremarkable Allergic/Immunologic:unremarkable  Dr. [name] reports that cardiac status stable, EF normal, some aortic stenosis present, last seen by Cardiology three months ago.   Past Medical History:Reviewed   Past Medical History  Surgical History: hernias, cholecystectomy appy Medical Problems:  High Blood pressure, High cholesterol Psychiatric History:  Depression Allergies: nkda Medications: nitrostat prn, zocor, ultram, xanax, effexor, protonix, baby asa   Social History:Reviewed   Social History  Preferred Language: English ([place]) Race:  White Ethnicity: Not Hispanic / Latino Age: 61 Years 5 Months Marital Status:  M Alcohol:  No Recreational drug(s):  No   Smoking Status: Current every day smoker reviewed on 04/25/2011 Started Date: 04/01/1968 Packs per day: 0.50   Family History:Reviewed   Family History              Father:  Stroke             Sibling:  Cancer-brother    Objective Information: General:Well appearing, well nourished in no distress. Head:Atraumatic; no masses; no abnormalities Neck:Supple without lymphadenopathy.  Heart:RRR, no murmur or  gallop.  Normal S1, S2.  No S3, S4.  Lungs:CTA bilaterally, no wheezes, rhonchi, rales.  Breathing unlabored. Abdomen:Soft, NT/ND, normal bowel sounds, no HSM, no masses.  No peritoneal signs.  Reducible supraumbilical and to the right hernia, around surgical scar  Assessment:Incisional hernia, umbilical  Diagnosis &amp; Procedure: DiagnosisCode: 553.21, ProcedureCode: 13086,    Plan:Scheduled for incisional herniorrhaphy with mesh on 05/01/11.  Will address TCS at future date.   Patient Education:Alternative treatments to surgery were discussed with patient (and family).Risks and benefits  of procedure were fully explained to the patient (and family) who gave informed consent. Patient/family questions were addressed.  Follow-up:Pending Surgery

## 2011-04-26 ENCOUNTER — Encounter (HOSPITAL_COMMUNITY): Admission: RE | Admit: 2011-04-26 | Payer: PRIVATE HEALTH INSURANCE | Source: Ambulatory Visit

## 2011-04-29 ENCOUNTER — Encounter (HOSPITAL_COMMUNITY): Payer: Self-pay | Admitting: Pharmacy Technician

## 2011-04-29 ENCOUNTER — Encounter (HOSPITAL_COMMUNITY)
Admission: RE | Admit: 2011-04-29 | Discharge: 2011-04-29 | Disposition: A | Discharge status: Home / Self Care | Payer: PRIVATE HEALTH INSURANCE | Source: Ambulatory Visit | Attending: General Surgery | Admitting: General Surgery

## 2011-04-29 ENCOUNTER — Encounter (HOSPITAL_COMMUNITY): Payer: Self-pay

## 2011-04-29 HISTORY — DX: Cerebral infarction, unspecified: I63.9

## 2011-04-29 HISTORY — DX: Anxiety disorder, unspecified: F41.9

## 2011-04-29 HISTORY — DX: Unspecified convulsions: R56.9

## 2011-04-29 LAB — CBC
HCT: 40.1 % (ref 39.0–52.0)
Hemoglobin: 13.6 g/dL (ref 13.0–17.0)
MCHC: 33.9 g/dL (ref 30.0–36.0)
RBC: 4.26 MIL/uL (ref 4.22–5.81)

## 2011-04-29 LAB — BASIC METABOLIC PANEL
BUN: 6 mg/dL (ref 6–23)
Chloride: 105 mEq/L (ref 96–112)
GFR calc Af Amer: >90 mL/min (ref >90)
Glucose, Bld: 85 mg/dL (ref 70–99)
Potassium: 4 mEq/L (ref 3.5–5.1)

## 2011-04-29 LAB — DIFFERENTIAL
Basophils Relative: 0 % (ref 0–1)
Lymphs Abs: 2.7 10*3/uL (ref 0.7–4.0)
Monocytes Absolute: 0.6 10*3/uL (ref 0.1–1.0)
Monocytes Relative: 8 % (ref 3–12)
Neutro Abs: 4.3 10*3/uL (ref 1.7–7.7)

## 2011-04-29 NOTE — Patient Instructions (Addendum)
20 CHAYNE BAUMGART  04/29/2011   Your procedure is scheduled on:  05/01/2011  Report to [facility] at  0855  AM.  Call this number if you have problems the morning of surgery: [phone]   Remember:   Do not eat food:After Midnight.  May have clear liquids:until Midnight .  Clear liquids include soda, tea, black coffee, apple or grape juice, broth.  Take these medicines the morning of surgery with A SIP OF WATER: protonix,xanax,ultram,effexor   Do not wear jewelry, make-up or nail polish.  Do not wear lotions, powders, or perfumes. You may wear deodorant.  Do not shave 48 hours prior to surgery.  Do not bring valuables to the hospital.  Contacts, dentures or bridgework may not be worn into surgery.  Leave suitcase in the car. After surgery it may be brought to your room.  For patients admitted to the hospital, checkout time is 11:00 AM the day of discharge.   Patients discharged the day of surgery will not be allowed to drive home.  Name and phone number of your driver: family  Special Instructions: CHG Shower Use Special Wash: 1/2 bottle night before surgery and 1/2 bottle morning of surgery.   Please read over the following fact sheets that you were given: MRSA Information, Surgical Site Infection Prevention, Anesthesia Post-op Instructions and Care and Recovery After Surgery PATIENT INSTRUCTIONS POST-ANESTHESIA  IMMEDIATELY FOLLOWING SURGERY:  Do not drive or operate machinery for the first twenty four hours after surgery.  Do not make any important decisions for twenty four hours after surgery or while taking narcotic pain medications or sedatives.  If you develop intractable nausea and vomiting or a severe headache please notify your doctor immediately.  FOLLOW-UP:  Please make an appointment with your surgeon as instructed. You do not need to follow up with anesthesia unless specifically instructed to do so.  WOUND CARE INSTRUCTIONS (if applicable):  Keep a dry clean dressing on the  anesthesia/puncture wound site if there is drainage.  Once the wound has quit draining you may leave it open to air.  Generally you should leave the bandage intact for twenty four hours unless there is drainage.  If the epidural site drains for more than 36-48 hours please call the anesthesia department.  QUESTIONS?:  Please feel free to call your physician or the hospital operator if you have any questions, and they will be happy to assist you.     [facility] Anesthesia Department [address] [place] [phone]

## 2011-05-01 ENCOUNTER — Ambulatory Visit (HOSPITAL_COMMUNITY): Payer: PRIVATE HEALTH INSURANCE | Admitting: Anesthesiology

## 2011-05-01 ENCOUNTER — Ambulatory Visit (HOSPITAL_COMMUNITY)
Admission: RE | Admit: 2011-05-01 | Discharge: 2011-05-01 | Disposition: A | Discharge status: Home / Self Care | Payer: PRIVATE HEALTH INSURANCE | Source: Ambulatory Visit | Attending: General Surgery | Admitting: General Surgery

## 2011-05-01 ENCOUNTER — Encounter (HOSPITAL_COMMUNITY)
Admission: RE | Disposition: A | Discharge status: Home / Self Care | Payer: Self-pay | Source: Ambulatory Visit | Attending: General Surgery

## 2011-05-01 ENCOUNTER — Encounter (HOSPITAL_COMMUNITY): Payer: Self-pay | Admitting: *Deleted

## 2011-05-01 ENCOUNTER — Encounter (HOSPITAL_COMMUNITY): Payer: Self-pay | Admitting: Anesthesiology

## 2011-05-01 DIAGNOSIS — K432 Incisional hernia without obstruction or gangrene: Secondary | ICD-10-CM | POA: Insufficient documentation

## 2011-05-01 HISTORY — PX: INCISIONAL HERNIA REPAIR: SHX193

## 2011-05-01 SURGERY — REPAIR, HERNIA, INCISIONAL
Anesthesia: Spinal | Wound class: Clean

## 2011-05-01 MED ORDER — LIDOCAINE HCL (PF) 1 % IJ SOLN
INTRAMUSCULAR | Status: AC
  Filled 2011-05-01: qty 30

## 2011-05-01 MED ORDER — CEFAZOLIN SODIUM-DEXTROSE 2-3 GM-% IV SOLR
INTRAVENOUS | Status: AC
  Filled 2011-05-01: qty 50

## 2011-05-01 MED ORDER — KETOROLAC TROMETHAMINE 30 MG/ML IJ SOLN
30.0000 mg | Freq: Once | INTRAMUSCULAR | Status: AC
  Administered 2011-05-01: 30 mg via INTRAVENOUS

## 2011-05-01 MED ORDER — BUPIVACAINE IN DEXTROSE 0.75-8.25 % IT SOLN
INTRATHECAL | Status: DC | PRN
  Administered 2011-05-01: 15 mg via INTRATHECAL

## 2011-05-01 MED ORDER — FENTANYL CITRATE 0.05 MG/ML IJ SOLN
25.0000 ug | INTRAMUSCULAR | Status: DC | PRN
  Administered 2011-05-01 (×3): 25 ug via INTRAVENOUS
  Administered 2011-05-01: 50 ug via INTRAVENOUS

## 2011-05-01 MED ORDER — LIDOCAINE HCL (PF) 1 % IJ SOLN
INTRAMUSCULAR | Status: DC | PRN
  Administered 2011-05-01: 7 mL

## 2011-05-01 MED ORDER — LIDOCAINE HCL (CARDIAC) 10 MG/ML IV SOLN
INTRAVENOUS | Status: DC | PRN
  Administered 2011-05-01: 20 mg via INTRAVENOUS
  Administered 2011-05-01: 10 mg via INTRAVENOUS

## 2011-05-01 MED ORDER — BUPIVACAINE HCL (PF) 0.5 % IJ SOLN
INTRAMUSCULAR | Status: AC
  Filled 2011-05-01: qty 30

## 2011-05-01 MED ORDER — PROPOFOL 10 MG/ML IV EMUL
INTRAVENOUS | Status: AC
  Filled 2011-05-01: qty 20

## 2011-05-01 MED ORDER — KETOROLAC TROMETHAMINE 30 MG/ML IJ SOLN
INTRAMUSCULAR | Status: AC
  Administered 2011-05-01: 30 mg via INTRAVENOUS
  Filled 2011-05-01: qty 1

## 2011-05-01 MED ORDER — ENOXAPARIN SODIUM 40 MG/0.4ML ~~LOC~~ SOLN
40.0000 mg | Freq: Once | SUBCUTANEOUS | Status: AC
  Administered 2011-05-01: 40 mg via SUBCUTANEOUS

## 2011-05-01 MED ORDER — BUPIVACAINE IN DEXTROSE 0.75-8.25 % IT SOLN
INTRATHECAL | Status: AC
  Filled 2011-05-01: qty 2

## 2011-05-01 MED ORDER — MIDAZOLAM HCL 2 MG/2ML IJ SOLN
1.0000 mg | INTRAMUSCULAR | Status: DC | PRN
  Administered 2011-05-01 (×2): 2 mg via INTRAVENOUS

## 2011-05-01 MED ORDER — MIDAZOLAM HCL 2 MG/2ML IJ SOLN
INTRAMUSCULAR | Status: AC
  Filled 2011-05-01: qty 2

## 2011-05-01 MED ORDER — CEFAZOLIN SODIUM-DEXTROSE 2-3 GM-% IV SOLR
2.0000 g | INTRAVENOUS | Status: AC
  Administered 2011-05-01: 2 g via INTRAVENOUS

## 2011-05-01 MED ORDER — ONDANSETRON HCL 4 MG/2ML IJ SOLN: 4.0000 mg | Freq: Once | INTRAMUSCULAR | Status: DC | PRN

## 2011-05-01 MED ORDER — POVIDONE-IODINE 10 % EX OINT
TOPICAL_OINTMENT | CUTANEOUS | Status: AC
  Filled 2011-05-01: qty 1

## 2011-05-01 MED ORDER — SODIUM CHLORIDE 0.9 % IR SOLN
Status: DC | PRN
  Administered 2011-05-01: 1000 mL

## 2011-05-01 MED ORDER — ENOXAPARIN SODIUM 40 MG/0.4ML ~~LOC~~ SOLN
SUBCUTANEOUS | Status: AC
  Filled 2011-05-01: qty 0.4

## 2011-05-01 MED ORDER — FENTANYL CITRATE 0.05 MG/ML IJ SOLN
INTRAMUSCULAR | Status: DC | PRN
  Administered 2011-05-01: 12.5 ug via INTRATHECAL

## 2011-05-01 MED ORDER — FENTANYL CITRATE 0.05 MG/ML IJ SOLN
INTRAMUSCULAR | Status: AC
  Administered 2011-05-01: 25 ug via INTRAVENOUS
  Filled 2011-05-01: qty 2

## 2011-05-01 MED ORDER — LACTATED RINGERS IV SOLN
INTRAVENOUS | Status: DC
  Administered 2011-05-01: 10:00:00 via INTRAVENOUS

## 2011-05-01 MED ORDER — PROPOFOL 10 MG/ML IV EMUL
INTRAVENOUS | Status: DC | PRN
  Administered 2011-05-01: 10 mg via INTRAVENOUS

## 2011-05-01 MED ORDER — POVIDONE-IODINE 10 % OINT PACKET
TOPICAL_OINTMENT | CUTANEOUS | Status: DC | PRN
  Administered 2011-05-01: 1 via TOPICAL

## 2011-05-01 MED ORDER — LIDOCAINE HCL (PF) 1 % IJ SOLN
INTRAMUSCULAR | Status: AC
  Filled 2011-05-01: qty 5

## 2011-05-01 MED ORDER — PROPOFOL 10 MG/ML IV EMUL
INTRAVENOUS | Status: DC | PRN
  Administered 2011-05-01: 11:00:00 via INTRAVENOUS
  Administered 2011-05-01: 100 ug/kg/min via INTRAVENOUS

## 2011-05-01 MED ORDER — ROCURONIUM BROMIDE 50 MG/5ML IV SOLN
INTRAVENOUS | Status: AC
  Filled 2011-05-01: qty 1

## 2011-05-01 MED ORDER — HYDROCODONE-ACETAMINOPHEN 5-325 MG PO TABS: 1.0000 | ORAL_TABLET | ORAL | Status: DC | PRN

## 2011-05-01 MED ORDER — FENTANYL CITRATE 0.05 MG/ML IJ SOLN
INTRAMUSCULAR | Status: DC | PRN
  Administered 2011-05-01: 25 ug via INTRAVENOUS
  Administered 2011-05-01 (×2): 12.5 ug via INTRAVENOUS
  Administered 2011-05-01: 25 ug via INTRAVENOUS
  Administered 2011-05-01: 12.5 ug via INTRAVENOUS

## 2011-05-01 MED ORDER — MIDAZOLAM HCL 5 MG/5ML IJ SOLN
INTRAMUSCULAR | Status: DC | PRN
  Administered 2011-05-01 (×2): 1 mg via INTRAVENOUS

## 2011-05-01 MED ORDER — FENTANYL CITRATE 0.05 MG/ML IJ SOLN
INTRAMUSCULAR | Status: AC
  Administered 2011-05-01: 50 ug via INTRAVENOUS
  Filled 2011-05-01: qty 2

## 2011-05-01 SURGICAL SUPPLY — 41 items
105640 IMPLANT
BAG HAMPER (MISCELLANEOUS) ×2 IMPLANT
CLOTH BEACON ORANGE TIMEOUT ST (SAFETY) ×2 IMPLANT
COVER LIGHT HANDLE STERIS (MISCELLANEOUS) ×4 IMPLANT
DECANTER SPIKE VIAL GLASS SM (MISCELLANEOUS) ×4 IMPLANT
DURAPREP 26ML APPLICATOR (WOUND CARE) ×2 IMPLANT
ELECT REM PT RETURN 9FT ADLT (ELECTROSURGICAL) ×2
ELECTRODE REM PT RTRN 9FT ADLT (ELECTROSURGICAL) ×1 IMPLANT
GLOVE BIO SURGEON STRL SZ7.5 (GLOVE) ×2 IMPLANT
GLOVE BIOGEL PI IND STRL 7.0 (GLOVE) ×1 IMPLANT
GLOVE BIOGEL PI IND STRL 7.5 (GLOVE) ×1 IMPLANT
GLOVE BIOGEL PI INDICATOR 7.0 (GLOVE) ×1
GLOVE BIOGEL PI INDICATOR 7.5 (GLOVE) ×1
GLOVE ECLIPSE 6.5 STRL STRAW (GLOVE) ×2 IMPLANT
GLOVE ECLIPSE 7.0 STRL STRAW (GLOVE) ×4 IMPLANT
GOWN STRL REIN XL XLG (GOWN DISPOSABLE) ×6 IMPLANT
INST SET MAJOR GENERAL (KITS) ×2 IMPLANT
KIT ROOM TURNOVER APOR (KITS) ×2 IMPLANT
MANIFOLD NEPTUNE II (INSTRUMENTS) ×2 IMPLANT
NEEDLE HYPO 25X1 1.5 SAFETY (NEEDLE) ×4 IMPLANT
NS IRRIG 1000ML POUR BTL (IV SOLUTION) ×2 IMPLANT
PACK ABDOMINAL MAJOR (CUSTOM PROCEDURE TRAY) ×2 IMPLANT
PAD ARMBOARD 7.5X6 YLW CONV (MISCELLANEOUS) ×2 IMPLANT
PATCH VENTRAL SMALL 4.3 (Mesh Specialty) ×2 IMPLANT
SET BASIN LINEN APH (SET/KITS/TRAYS/PACK) ×2 IMPLANT
SPONGE GAUZE 4X4 12PLY (GAUZE/BANDAGES/DRESSINGS) ×2 IMPLANT
STAPLER VISISTAT (STAPLE) ×2 IMPLANT
SUT ETHIBOND NAB MO 7 #0 18IN (SUTURE) ×2 IMPLANT
SUT NOVA NAB GS-21 1 T12 (SUTURE) IMPLANT
SUT NOVA NAB GS-22 2 2-0 T-19 (SUTURE) IMPLANT
SUT NOVA NAB GS-26 0 60 (SUTURE) IMPLANT
SUT PROLENE 0 CT 1 CR/8 (SUTURE) IMPLANT
SUT SILK 2 0 (SUTURE)
SUT SILK 2-0 18XBRD TIE 12 (SUTURE) IMPLANT
SUT VIC AB 2-0 CT1 27 (SUTURE)
SUT VIC AB 2-0 CT1 TAPERPNT 27 (SUTURE) IMPLANT
SUT VIC AB 3-0 SH 27 (SUTURE) ×2
SUT VIC AB 3-0 SH 27X BRD (SUTURE) ×1 IMPLANT
SUT VIC AB 4-0 PS2 27 (SUTURE) ×2 IMPLANT
SYR CONTROL 10ML LL (SYRINGE) ×4 IMPLANT
TAPE CLOTH SURG 4X10 WHT LF (GAUZE/BANDAGES/DRESSINGS) ×2 IMPLANT

## 2011-05-01 NOTE — Op Note (Signed)
Patient:  [name]  DOB:  April 12, 1950  MRN:  [id number]   Preop Diagnosis:  Incisional hernia  Postop Diagnosis:  Same  Procedure:  Incisional herniorrhaphy with mesh  Surgeon:  Franky Macho, M.D.  Anes:  Spinal  Indications:  Patient is a 61 year old white male status post incisional herniorrhaphy in the past and umbilical region he now presents with a recurrence of the hernia just superior and to the right of the umbilicus. The risks and benefits of the procedure including bleeding, infection, and a possibly recurrence of the hernia were fully explained to the patient, gave consent.  Procedure note:  Patient is placed the supine position. After spinal anesthesia was administered, the abdomen was prepped and draped using usual sterile technique with DuraPrep. Surgical site confirmation was performed. Anesthesia was supplemented with instillation of 1% Xylocaine in the skin.  A supraumbilical incision was made just to the right of the umbilicus. The dissection was taken down to the hernia sac. The hernia sac was excised at difficulty. Omentum was noted to be somewhat incarcerated along the edges of the fascia. These were lysed without difficulty in the omentum was reduced. The defect was approximately 1.5 cm in size. A medium size proceed patch was then inserted and the tabs secured to the fascia using 0 Ethibond interrupted sutures. The subcutaneous layer was reapproximated using 3-0 Vicryl interrupted sutures. The skin was closed using staples. Betadine ointment dorsal dressing were applied.  All tape and needle counts were correct at the end of the procedure. The patient was transferred back to PACU in stable condition.  Complications:  None  EBL:  None  Specimen:  None

## 2011-05-01 NOTE — Anesthesia Procedure Notes (Signed)
Date/Time: 05/01/2011 10:00 AM Performed by: Minerva Areola Pre-anesthesia Checklist: Patient identified, Patient being monitored, Emergency Drugs available, Timeout performed and Suction available Patient Re-evaluated:Patient Re-evaluated prior to inductionOxygen Delivery Method: Nasal Cannula    Spinal  Start time: 05/01/2011 10:10 AM End time: 05/01/2011 10:14 AM Staffing CRNA/Resident: Minerva Areola Preanesthetic Checklist Completed: patient identified, site marked, surgical consent, pre-op evaluation, timeout performed, IV checked, risks and benefits discussed and monitors and equipment checked Additional Notes   Right   Lateral   Betadine  prep x 3. Sterile drape.  1% lidocaine 1cc  skin wheal Right   paramedian  22 g Braun Spinocan 3 inch needle inserted L3-4 Clear CSF pre and post injection Attempts: 1 Marcaine 15 mg/ Fentanyl 12.5 mcg  Tray Lot [id number] Tray expiration:2013-11  T  6 Level obtained at 1024

## 2011-05-01 NOTE — OR Nursing (Signed)
Slow Demenishing spinal,  Dressed waiting on daughter, up to recliner with assistance.  Relieved of duties per Armando Gang RN

## 2011-05-01 NOTE — Anesthesia Postprocedure Evaluation (Signed)
Anesthesia Post Note  Patient: [name]  Procedure(s) Performed:  HERNIA REPAIR INCISIONAL - with Mesh  Anesthesia type: Spinal  Patient location: PACU  Post pain: Pain level controlled  Post assessment: Post-op Vital signs reviewed, Patient's Cardiovascular Status Stable, Respiratory Function Stable, Patent Airway, No signs of Nausea or vomiting and Pain level controlled  Last Vitals:  Filed Vitals:   05/01/11 1056  BP:   Pulse:   Temp: 36.7 C  Resp:     Post vital signs: Reviewed and stable 95/65 76 100% 16 Respirations  Level of consciousness: awake and alert   Complications: No apparent anesthesia complications

## 2011-05-01 NOTE — Anesthesia Preprocedure Evaluation (Addendum)
Anesthesia Evaluation  Patient identified by MRN, date of birth, ID band Patient awake    Reviewed: Allergy & Precautions, H&P , NPO status , Patient's Chart, lab work & pertinent test results  History of Anesthesia Complications Negative for: history of anesthetic complications  Airway Mallampati: I      Dental  (+) Edentulous Upper and Edentulous Lower   Pulmonary shortness of breath (recent URI, still with productive cough.) and with exertion, Current Smoker,  + rhonchi        Cardiovascular + CAD and + Past MI (2012) + Valvular Problems/Murmurs (moderate) AS and MR Regular Normal    Neuro/Psych Seizures - (none 6 yrs), Well Controlled,  PSYCHIATRIC DISORDERS Anxiety Depression    GI/Hepatic GERD-  Medicated and Poorly Controlled,  Endo/Other    Renal/GU      Musculoskeletal   Abdominal   Peds  Hematology   Anesthesia Other Findings   Reproductive/Obstetrics                           Anesthesia Physical Anesthesia Plan  ASA: III  Anesthesia Plan: Spinal   Post-op Pain Management:    Induction: Intravenous  Airway Management Planned: Nasal Cannula  Additional Equipment:   Intra-op Plan:   Post-operative Plan:   Informed Consent: I have reviewed the patients History and Physical, chart, labs and discussed the procedure including the risks, benefits and alternatives for the proposed anesthesia with the patient or authorized representative who has indicated his/her understanding and acceptance.     Plan Discussed with:   Anesthesia Plan Comments:         Anesthesia Quick Evaluation

## 2011-05-01 NOTE — Transfer of Care (Signed)
Immediate Anesthesia Transfer of Care Note  Patient: [name]  Procedure(s) Performed:  HERNIA REPAIR INCISIONAL - with Mesh  Patient Location: PACU  Anesthesia Type: SAB  Level of Consciousness: awake  Airway & Oxygen Therapy: Patient Spontanous Breathing and nasal cannula  Post-op Assessment: Report given to PACU RN, Post -op Vital signs reviewed and stable. SAB Level  T 8  Post vital signs: Reviewed and stable  Complications: No apparent anesthesia complications

## 2011-05-01 NOTE — Interval H&P Note (Signed)
History and Physical Interval Note:  05/01/2011 9:32 AM  [name]  has presented today for surgery, with the diagnosis of Incisional hernia   The various methods of treatment have been discussed with the patient and family. After consideration of risks, benefits and other options for treatment, the patient has consented to  Procedure(s): HERNIA REPAIR INCISIONAL as a surgical intervention .  The patients' history has been reviewed, patient examined, no change in status, stable for surgery.  I have reviewed the patients' chart and labs.  Questions were answered to the patient's satisfaction.     Franky Macho A

## 2011-05-15 ENCOUNTER — Encounter (HOSPITAL_COMMUNITY): Payer: Self-pay | Admitting: General Surgery

## 2011-05-15 MED FILL — Bupivacaine HCl Preservative Free (PF) Inj 0.5%: INTRAMUSCULAR | Qty: 10 | Status: AC

## 2011-06-03 ENCOUNTER — Other Ambulatory Visit: Payer: Self-pay | Admitting: Cardiology

## 2011-06-03 DIAGNOSIS — I359 Nonrheumatic aortic valve disorder, unspecified: Secondary | ICD-10-CM

## 2011-07-10 ENCOUNTER — Other Ambulatory Visit (INDEPENDENT_AMBULATORY_CARE_PROVIDER_SITE_OTHER): Payer: PRIVATE HEALTH INSURANCE

## 2011-07-10 ENCOUNTER — Other Ambulatory Visit: Payer: Self-pay

## 2011-07-10 DIAGNOSIS — I2510 Atherosclerotic heart disease of native coronary artery without angina pectoris: Secondary | ICD-10-CM

## 2011-07-10 DIAGNOSIS — I359 Nonrheumatic aortic valve disorder, unspecified: Secondary | ICD-10-CM

## 2011-07-24 ENCOUNTER — Telehealth: Payer: Self-pay | Admitting: *Deleted

## 2011-07-24 ENCOUNTER — Ambulatory Visit (INDEPENDENT_AMBULATORY_CARE_PROVIDER_SITE_OTHER): Payer: PRIVATE HEALTH INSURANCE | Admitting: Cardiology

## 2011-07-24 ENCOUNTER — Encounter: Payer: Self-pay | Admitting: Cardiology

## 2011-07-24 ENCOUNTER — Encounter: Payer: Self-pay | Admitting: *Deleted

## 2011-07-24 VITALS — BP 112/81 | HR 82 | Ht 72.0 in | Wt 200.0 lb

## 2011-07-24 DIAGNOSIS — I359 Nonrheumatic aortic valve disorder, unspecified: Secondary | ICD-10-CM

## 2011-07-24 DIAGNOSIS — F329 Major depressive disorder, single episode, unspecified: Secondary | ICD-10-CM

## 2011-07-24 DIAGNOSIS — I2510 Atherosclerotic heart disease of native coronary artery without angina pectoris: Secondary | ICD-10-CM

## 2011-07-24 DIAGNOSIS — I350 Nonrheumatic aortic (valve) stenosis: Secondary | ICD-10-CM

## 2011-07-24 DIAGNOSIS — R072 Precordial pain: Secondary | ICD-10-CM

## 2011-07-24 DIAGNOSIS — I059 Rheumatic mitral valve disease, unspecified: Secondary | ICD-10-CM

## 2011-07-24 DIAGNOSIS — IMO0001 Reserved for inherently not codable concepts without codable children: Secondary | ICD-10-CM

## 2011-07-24 DIAGNOSIS — I340 Nonrheumatic mitral (valve) insufficiency: Secondary | ICD-10-CM

## 2011-07-24 DIAGNOSIS — M797 Fibromyalgia: Secondary | ICD-10-CM | POA: Insufficient documentation

## 2011-07-24 DIAGNOSIS — R0602 Shortness of breath: Secondary | ICD-10-CM

## 2011-07-24 DIAGNOSIS — E785 Hyperlipidemia, unspecified: Secondary | ICD-10-CM

## 2011-07-24 DIAGNOSIS — Z01810 Encounter for preprocedural cardiovascular examination: Secondary | ICD-10-CM

## 2011-07-24 NOTE — Assessment & Plan Note (Signed)
Nonobstructive coronary artery disease by catheterization in December 2010 performed by Dr. Ivette Loyal crying.  The patient had normal LV function and the left main coronary artery was normal LAD was large and wrapping around the apex.  The circumflex had a 25% stenosis in the AV groove and obtuse marginal branch which was large had a proximal 60% stenosis.  The right coronary artery was a dominant vessel and it was a long proximal 40% stenosis and a 30% mid stenosis.unfortunately the patient continues to smoke.

## 2011-07-24 NOTE — Assessment & Plan Note (Signed)
followup echocardiogram in April 2013 showed increased in severity of aortic stenosis secondary to bicuspid aortic valve.  Mean pressure gradient of 40 mmHg with a calculated valve area of 0.17 cm.  Peak velocity ratio LVOT 2 aortic valve is 0.23 valve is bicuspid and moderately calcified or significant turbulent transvalvular flow.the patient will be referred for left and right heart catheterization and for further consultation with cardiac surgery for valve replacement.  Although his symptoms are clouded by depression, fibromyalgia he does appear quite careful history that he has worsened shortness of breath.  He states that the shortness of breath just climbing up one flight of stairs.  He has to do everything quite slowly because of dyspnea.  He occasionally experiences chest pain and he uses nitroglycerin.  His chest pain however antedates his last cardiac catheterization December 2010.

## 2011-07-24 NOTE — Progress Notes (Signed)
Allen Kaufhold De Gent, MD, FACC  ABIM Board Certified in Adult Cardiovascular Medicine,Internal Medicine and Critical Care Medicine  CC: followup patient with bicuspid aortic valve and aortic stenosis  HPI:  The patient has known bicuspid aortic valve with previously mean gradient of 34 mmHg. He has developed progressive dyspnea and a few weeks ago he had a repeat echocardiogram done which now showed a mean pressure gradient of 40 mmHg across a calcified bicuspid aortic valve. Symptomatically the patient appears to have worsened with symptoms of chest pain and dyspnea on minimal exertion. However the patient has significant depression and therefore his symptoms of shortness of breath and chest pain particularly in the setting of fibromyalgia are little bit difficult to sort out how much this could be related to his underlying aortic stenosis.nevertheless his valvular heart disease appears to have progressed and the patient states that he is not able to walk up a flight of stairs without becoming very short of breath.previously been performed a TEE to establish that he had bicuspid aortic valve. His last cardiac catheterization was in 2010 when he was complaining of chest pain. He had nonobstructive coronary artery disease. Unfortunately he continues to smoke. The patient complains of diffuse aches and pains. He denies any orthopnea or PND he has no palpitations or presyncope or syncope.  PMH: reviewed and listed in Problem List in Electronic Records (and see below)  Past Medical History   Diagnosis  Date   .  Murmur      Trace mitral regurgitation by echocardiogram February 2012   .  Hyperlipidemia      mixed   .  Mitral regurgitation      New murmur at the apex consistent with mitral regurgitation   .  Aortic stenosis      Bicuspid aortic valve with mild-to-moderate aortic stenosis echocardiogram February 2012 mean gradient 34 mmHg, aortic valve area 0.7 cm, peak velocity 2.43 m/scomment by catheterization  mean gradient 18 mmHg December 2010 status post TEE   .  CAD (coronary artery disease)      native vessel, nonobstructive, by catheterization, last catheterization December 2010   .  Dyslipidemia    .  Major depressive disorder      wih prior suicidal attempts   .  Myocardial infarction  10 yrs ago   .  Stroke      12 yrs ago   .  Shortness of breath    .  Seizures      6 yrs ago from withdrawal of xanax to quickly.   .  Anxiety    .  Fibromyalgia     Past Surgical History   Procedure  Date   .  Inguinal exploration      right   .  Femoral hernia repair      x2   .  Umbilical hernia repair      [facility]   .  Finger contracture release      Tendon release operation on his right little finger   .  Appendectomy    .  Tonsillectomy    .  Cardiac catheterization    .  Cholecystectomy      [facility]   .  Incisional hernia repair  05/01/2011     Procedure: HERNIA REPAIR INCISIONAL; Surgeon: Mark A Jenkins, MD; Location: [facility]; Service: General; Laterality: N/A; with Mesh    Allergies/SH/FHX : available in Electronic Records for review  No Known Allergies  History    Social History   .    Marital Status:  Single     Spouse Name:  N/A     Number of Children:  N/A   .  Years of Education:  N/A    Occupational History   .  Disabled     Social History Main Topics   .  Smoking status:  Current Everyday Smoker -- 0.5 packs/day for 48 years     Types:  Cigarettes   .  Smokeless tobacco:  Never Used   .  Alcohol Use:  No   .  Drug Use:  No   .  Sexually Active:  Yes     Birth Control/ Protection:  None    Other Topics  Concern   .  Not on file    Social History Narrative   .  No narrative on file    Family History   Problem  Relation  Age of Onset   .  Cancer     .  Stroke     .  Anesthesia problems  Neg Hx    .  Hypotension  Neg Hx    .  Malignant hyperthermia  Neg Hx    .  Pseudochol deficiency  Neg Hx     Medications:  Current Outpatient Prescriptions   Medication  Sig   Dispense  Refill   .  acetaminophen (TYLENOL) 500 MG tablet  Take 500 mg by mouth every 6 (six) hours as needed. For pain     .  ALPRAZolam (XANAX) 1 MG tablet  Take 1 mg by mouth 5 (five) times daily.     .  aspirin EC 81 MG tablet  Take 81 mg by mouth daily.     .  HYDROcodone-acetaminophen (NORCO) 5-325 MG per tablet  Take 1 tablet by mouth every 8 (eight) hours as needed.     .  nitroGLYCERIN (NITROSTAT) 0.4 MG SL tablet  Place 0.4 mg under the tongue every 5 (five) minutes as needed. For chest pains     .  pantoprazole (PROTONIX) 40 MG tablet  Take 40 mg by mouth daily.     .  polyethylene glycol powder (MIRALAX) powder  Take 17 g by mouth daily.     .  promethazine (PHENERGAN) 25 MG tablet  Take 1 tablet by mouth 4 times daily as needed.     .  simvastatin (ZOCOR) 40 MG tablet  Take 40 mg by mouth at bedtime.     .  venlafaxine (EFFEXOR-XR) 150 MG 24 hr capsule  Take 150 mg by mouth 2 (two) times daily.      ROS: No nausea or vomiting. No fever or chills.No melena or hematochezia.No bleeding.No claudication  Physical Exam:  BP 112/81  Pulse 82  Ht 6' (1.829 m)  Wt 200 lb (90.719 kg)  BMI 27.12 kg/m2  SpO2 99%  General:well-nourished white male with depressive affect  Neck:pulses parvus and tardus i bilaterally with no carotid bruits. No thyromegaly and no nodular thyroid  Lungs:clear breath sounds bilaterally without wheezing  Cardiac:regular rate and rhythm with a 2/6 crescendo decrescendo murmur most prominent at the right upper sternal border. There is some decrease in intensity of S2 but this is not particularly marked. S1 is normal. There is no S3 or S4. No other pathological murmurs  Vascular:no edema. Normal distal pulses bilaterally  Skin:warm and dry  Physcologic:depressed affect  12lead ECG:not obtained  Limited bedside ECHO:N/A  No images are attached to the encounter.  Assessment and Plan  Aortic stenosis    followup echocardiogram in April 2013 showed increased in  severity of aortic stenosis secondary to bicuspid aortic valve. Mean pressure gradient of 40 mmHg with a calculated valve area of 0.17 cm. Peak velocity ratio LVOT 2 aortic valve is 0.23 valve is bicuspid and moderately calcified or significant turbulent transvalvular flow.the patient will be referred for left and right heart catheterization and for further consultation with cardiac surgery for valve replacement. Although his symptoms are clouded by depression, fibromyalgia he does appear after obtaining a careful history that he has worsened shortness of breath. He states that the shortness of breath just climbing up one flight of stairs. He has to do everything quite slowly because of dyspnea. He occasionally experiences chest pain and he uses nitroglycerin. His chest pain however antedates his last cardiac catheterization December 2010.  CAD (coronary artery disease)  Nonobstructive coronary artery disease by catheterization in December 2010 performed by Dr. Hock crying. The patient had normal LV function and the left main coronary artery was normal LAD was large and wrapping around the apex. The circumflex had a 25% stenosis in the AV groove and obtuse marginal branch which was large had a proximal 60% stenosis. The right coronary artery was a dominant vessel and it was a long proximal 40% stenosis and a 30% mid stenosis.unfortunately the patient continues to smoke.  HYPERLIPIDEMIA-MIXED  followup by the patient's primary care physician  Mitral regurgitation  No evidence of significant mitral regurgitation by most recent echocardiogram. Mitral regurgitation was labeled as trivial.  Major depressive disorder  Continues to be a problem even on Effexor.  Fibromyalgia  Switch from tramadol to hydrocodone by his primary care physician  DYSPNEA  Likely secondary to underlying aortic stenosis.  Patient Active Problem List   Diagnoses   .  HYPERLIPIDEMIA-MIXED   .  DEPRESSION, MAJOR   .  CONSTIPATION     .  DYSPNEA   .  CHEST PAIN, PLEURITIC   .  Mitral regurgitation   .  CAD (coronary artery disease)   .  Aortic stenosis   .  Major depressive disorder   .  Fibromyalgia     

## 2011-07-24 NOTE — Assessment & Plan Note (Signed)
Likely secondary to underlying aortic stenosis.

## 2011-07-24 NOTE — Patient Instructions (Addendum)
   Left & right heart cath - scheduled for Wednesday, May 1 - see info sheet given  Referral to TTS for appointment in the next few weeks after cath for evaluation of severe aortic stenosis Follow up in  3 months

## 2011-07-24 NOTE — Assessment & Plan Note (Signed)
No evidence of significant mitral regurgitation by most recent echocardiogram.  Mitral regurgitation was labeled as trivial.

## 2011-07-24 NOTE — Telephone Encounter (Signed)
.   Precert - L & R cath scheduled for Wednesday, May 1 with Dr. Kirke Corin

## 2011-07-24 NOTE — Telephone Encounter (Signed)
No precert required 

## 2011-07-24 NOTE — Assessment & Plan Note (Signed)
Continues to be a problem even on Effexor.

## 2011-07-24 NOTE — Assessment & Plan Note (Signed)
followup by the patient's primary care physician

## 2011-07-24 NOTE — Assessment & Plan Note (Signed)
Switch from tramadol to hydrocodone by his primary care physician

## 2011-07-25 LAB — PROTIME-INR

## 2011-07-30 ENCOUNTER — Other Ambulatory Visit: Payer: Self-pay | Admitting: Cardiology

## 2011-07-30 DIAGNOSIS — I350 Nonrheumatic aortic (valve) stenosis: Secondary | ICD-10-CM

## 2011-07-30 NOTE — H&P (Signed)
Peyton Bottoms, MD, [facility]  ABIM Board Certified in Adult Cardiovascular Medicine,Internal Medicine and Critical Care Medicine  CC: followup patient with bicuspid aortic valve and aortic stenosis  HPI:  The patient has known bicuspid aortic valve with previously mean gradient of 34 mmHg. He has developed progressive dyspnea and a few weeks ago he had a repeat echocardiogram done which now showed a mean pressure gradient of 40 mmHg across a calcified bicuspid aortic valve. Symptomatically the patient appears to have worsened with symptoms of chest pain and dyspnea on minimal exertion. However the patient has significant depression and therefore his symptoms of shortness of breath and chest pain particularly in the setting of fibromyalgia are little bit difficult to sort out how much this could be related to his underlying aortic stenosis.nevertheless his valvular heart disease appears to have progressed and the patient states that he is not able to walk up a flight of stairs without becoming very short of breath.previously been performed a TEE to establish that he had bicuspid aortic valve. His last cardiac catheterization was in 2010 when he was complaining of chest pain. He had nonobstructive coronary artery disease. Unfortunately he continues to smoke. The patient complains of diffuse aches and pains. He denies any orthopnea or PND he has no palpitations or presyncope or syncope.  PMH: reviewed and listed in Problem List in Electronic Records (and see below)  Past Medical History   Diagnosis  Date   .  Murmur      Trace mitral regurgitation by echocardiogram February 2012   .  Hyperlipidemia      mixed   .  Mitral regurgitation      New murmur at the apex consistent with mitral regurgitation   .  Aortic stenosis      Bicuspid aortic valve with mild-to-moderate aortic stenosis echocardiogram February 2012 mean gradient 34 mmHg, aortic valve area 0.7 cm, peak velocity 2.43 m/scomment by catheterization  mean gradient 18 mmHg December 2010 status post TEE   .  CAD (coronary artery disease)      native vessel, nonobstructive, by catheterization, last catheterization December 2010   .  Dyslipidemia    .  Major depressive disorder      wih prior suicidal attempts   .  Myocardial infarction  10 yrs ago   .  Stroke      12 yrs ago   .  Shortness of breath    .  Seizures      6 yrs ago from withdrawal of xanax to quickly.   .  Anxiety    .  Fibromyalgia     Past Surgical History   Procedure  Date   .  Inguinal exploration      right   .  Femoral hernia repair      x2   .  Umbilical hernia repair      [facility]   .  Finger contracture release      Tendon release operation on his right little finger   .  Appendectomy    .  Tonsillectomy    .  Cardiac catheterization    .  Cholecystectomy      [facility]   .  Incisional hernia repair  05/01/2011     Procedure: HERNIA REPAIR INCISIONAL; Surgeon: Dalia Heading, MD; Location: [facility]; Service: General; Laterality: N/A; with Mesh    Allergies/SH/FHX : available in Electronic Records for review  No Known Allergies  History    Social History   .  Marital Status:  Single     Spouse Name:  N/A     Number of Children:  N/A   .  Years of Education:  N/A    Occupational History   .  Disabled     Social History Main Topics   .  Smoking status:  Current Everyday Smoker -- 0.5 packs/day for 48 years     Types:  Cigarettes   .  Smokeless tobacco:  Never Used   .  Alcohol Use:  No   .  Drug Use:  No   .  Sexually Active:  Yes     Birth Control/ Protection:  None    Other Topics  Concern   .  Not on file    Social History Narrative   .  No narrative on file    Family History   Problem  Relation  Age of Onset   .  Cancer     .  Stroke     .  Anesthesia problems  Neg Hx    .  Hypotension  Neg Hx    .  Malignant hyperthermia  Neg Hx    .  Pseudochol deficiency  Neg Hx     Medications:  Current Outpatient Prescriptions   Medication  Sig   Dispense  Refill   .  acetaminophen (TYLENOL) 500 MG tablet  Take 500 mg by mouth every 6 (six) hours as needed. For pain     .  ALPRAZolam (XANAX) 1 MG tablet  Take 1 mg by mouth 5 (five) times daily.     [name]  aspirin EC 81 MG tablet  Take 81 mg by mouth daily.     [name]  HYDROcodone-acetaminophen (NORCO) 5-325 MG per tablet  Take 1 tablet by mouth every 8 (eight) hours as needed.     .  nitroGLYCERIN (NITROSTAT) 0.4 MG SL tablet  Place 0.4 mg under the tongue every 5 (five) minutes as needed. For chest pains     .  pantoprazole (PROTONIX) 40 MG tablet  Take 40 mg by mouth daily.     .  polyethylene glycol powder (MIRALAX) powder  Take 17 g by mouth daily.     .  promethazine (PHENERGAN) 25 MG tablet  Take 1 tablet by mouth 4 times daily as needed.     .  simvastatin (ZOCOR) 40 MG tablet  Take 40 mg by mouth at bedtime.     [name]  venlafaxine (EFFEXOR-XR) 150 MG 24 hr capsule  Take 150 mg by mouth 2 (two) times daily.      ROS: No nausea or vomiting. No fever or chills.No melena or hematochezia.No bleeding.No claudication  Physical Exam:  BP 112/81  Pulse 82  Ht 6' (1.829 m)  Wt 200 lb (90.719 kg)  BMI 27.12 kg/m2  SpO2 99%  General:well-nourished white male with depressive affect  Neck:pulses parvus and tardus i bilaterally with no carotid bruits. No thyromegaly and no nodular thyroid  Lungs:clear breath sounds bilaterally without wheezing  Cardiac:regular rate and rhythm with a 2/6 crescendo decrescendo murmur most prominent at the right upper sternal border. There is some decrease in intensity of S2 but this is not particularly marked. S1 is normal. There is no S3 or S4. No other pathological murmurs  Vascular:no edema. Normal distal pulses bilaterally  Skin:warm and dry  Physcologic:depressed affect  12lead ECG:not obtained  Limited bedside ECHO:N/A  No images are attached to the encounter.  Assessment and Plan  Aortic stenosis  followup echocardiogram in April 2013 showed increased in  severity of aortic stenosis secondary to bicuspid aortic valve. Mean pressure gradient of 40 mmHg with a calculated valve area of 0.17 cm. Peak velocity ratio LVOT 2 aortic valve is 0.23 valve is bicuspid and moderately calcified or significant turbulent transvalvular flow.the patient will be referred for left and right heart catheterization and for further consultation with cardiac surgery for valve replacement. Although his symptoms are clouded by depression, fibromyalgia he does appear after obtaining a careful history that he has worsened shortness of breath. He states that the shortness of breath just climbing up one flight of stairs. He has to do everything quite slowly because of dyspnea. He occasionally experiences chest pain and he uses nitroglycerin. His chest pain however antedates his last cardiac catheterization December 2010.  CAD (coronary artery disease)  Nonobstructive coronary artery disease by catheterization in December 2010 performed by Dr. Ivette Loyal crying. The patient had normal LV function and the left main coronary artery was normal LAD was large and wrapping around the apex. The circumflex had a 25% stenosis in the AV groove and obtuse marginal branch which was large had a proximal 60% stenosis. The right coronary artery was a dominant vessel and it was a long proximal 40% stenosis and a 30% mid stenosis.unfortunately the patient continues to smoke.  HYPERLIPIDEMIA-MIXED  followup by the patient's primary care physician  Mitral regurgitation  No evidence of significant mitral regurgitation by most recent echocardiogram. Mitral regurgitation was labeled as trivial.  Major depressive disorder  Continues to be a problem even on Effexor.  Fibromyalgia  Switch from tramadol to hydrocodone by his primary care physician  DYSPNEA  Likely secondary to underlying aortic stenosis.  Patient Active Problem List   Diagnoses   .  HYPERLIPIDEMIA-MIXED   .  DEPRESSION, MAJOR   .  CONSTIPATION     .  DYSPNEA   .  CHEST PAIN, PLEURITIC   .  Mitral regurgitation   .  CAD (coronary artery disease)   .  Aortic stenosis   .  Major depressive disorder   .  Fibromyalgia

## 2011-07-31 ENCOUNTER — Inpatient Hospital Stay (HOSPITAL_BASED_OUTPATIENT_CLINIC_OR_DEPARTMENT_OTHER)
Admission: RE | Admit: 2011-07-31 | Discharge: 2011-07-31 | Disposition: A | Discharge status: Home / Self Care | Payer: PRIVATE HEALTH INSURANCE | Source: Ambulatory Visit | Attending: Cardiovascular Disease | Admitting: Cardiovascular Disease

## 2011-07-31 ENCOUNTER — Encounter (HOSPITAL_BASED_OUTPATIENT_CLINIC_OR_DEPARTMENT_OTHER)
Admission: RE | Disposition: A | Discharge status: Home / Self Care | Payer: Self-pay | Source: Ambulatory Visit | Attending: Cardiovascular Disease

## 2011-07-31 DIAGNOSIS — I359 Nonrheumatic aortic valve disorder, unspecified: Secondary | ICD-10-CM | POA: Insufficient documentation

## 2011-07-31 DIAGNOSIS — I2510 Atherosclerotic heart disease of native coronary artery without angina pectoris: Secondary | ICD-10-CM | POA: Insufficient documentation

## 2011-07-31 DIAGNOSIS — I252 Old myocardial infarction: Secondary | ICD-10-CM | POA: Insufficient documentation

## 2011-07-31 DIAGNOSIS — I350 Nonrheumatic aortic (valve) stenosis: Secondary | ICD-10-CM

## 2011-07-31 DIAGNOSIS — E785 Hyperlipidemia, unspecified: Secondary | ICD-10-CM | POA: Insufficient documentation

## 2011-07-31 LAB — POCT I-STAT 3, VENOUS BLOOD GAS (G3P V)
Bicarbonate: 23 mEq/L (ref 20.0–24.0)
TCO2: 24 mmol/L (ref 0–100)
pCO2, Ven: 43.3 mmHg — ABNORMAL LOW (ref 45.0–50.0)
pH, Ven: 7.334 — ABNORMAL HIGH (ref 7.250–7.300)

## 2011-07-31 LAB — POCT I-STAT 3, ART BLOOD GAS (G3+)
pCO2 arterial: 39.6 mmHg (ref 35.0–45.0)
pH, Arterial: 7.394 (ref 7.350–7.450)

## 2011-07-31 SURGERY — JV LEFT AND RIGHT HEART CATHETERIZATION WITH CORONARY ANGIOGRAM
Anesthesia: Moderate Sedation

## 2011-07-31 MED ORDER — SODIUM CHLORIDE 0.9 % IV SOLN
INTRAVENOUS | Status: DC
  Administered 2011-07-31: 10:00:00 via INTRAVENOUS

## 2011-07-31 MED ORDER — SODIUM CHLORIDE 0.9 % IJ SOLN: 3.0000 mL | INTRAMUSCULAR | Status: DC | PRN

## 2011-07-31 MED ORDER — SODIUM CHLORIDE 0.9 % IV SOLN: INTRAVENOUS | Status: AC

## 2011-07-31 MED ORDER — ASPIRIN 81 MG PO CHEW
324.0000 mg | CHEWABLE_TABLET | ORAL | Status: AC
  Administered 2011-07-31: 324 mg via ORAL

## 2011-07-31 MED ORDER — DIAZEPAM 5 MG PO TABS
5.0000 mg | ORAL_TABLET | ORAL | Status: AC
  Administered 2011-07-31: 5 mg via ORAL

## 2011-07-31 MED ORDER — SODIUM CHLORIDE 0.9 % IV SOLN: 250.0000 mL | INTRAVENOUS | Status: DC | PRN

## 2011-07-31 MED ORDER — SODIUM CHLORIDE 0.9 % IJ SOLN: 3.0000 mL | Freq: Two times a day (BID) | INTRAMUSCULAR | Status: DC

## 2011-07-31 MED ORDER — ONDANSETRON HCL 4 MG/2ML IJ SOLN: 4.0000 mg | Freq: Four times a day (QID) | INTRAMUSCULAR | Status: DC | PRN

## 2011-07-31 MED ORDER — ACETAMINOPHEN 325 MG PO TABS: 650.0000 mg | ORAL_TABLET | ORAL | Status: DC | PRN

## 2011-07-31 NOTE — CV Procedure (Signed)
   Cardiac Catheterization Procedure Note  Name: [name] MRN: [id number] DOB: January 08, 1951  Procedure: Right Heart Cath,  Selective Coronary Angiography, aortic root angiography.  Indication: Aortic stenosis.   Procedural Details: The right groin was prepped, draped, and anesthetized with 1% lidocaine. Using the modified Seldinger technique a 5 French sheath was placed in the right femoral artery and a 7 French sheath was placed in the right femoral vein. A Swan-Ganz catheter was used for the right heart catheterization. Standard protocol was followed for recording of right heart pressures and sampling of oxygen saturations. Fick cardiac output was calculated. Multiple attempts to cross the aortic valve were unsuccessful. I used a straight tip wire with an AL-1 , AL 2 and multipurpose catheter. The AL 2 catheter was used to engage the left main and right coronary artery. The patient did have mild itching on the right side of the face with some redness. He was given 25 mg Benadryl.. The patient was transferred to the post catheterization recovery area for further monitoring.  Procedural Findings: Hemodynamics RA 6 mmHg RV 26/6 mmHg PA 22/10 mmHg PCWP 10 mmHg LV not recorded  AO 148/93 mmHg  Oxygen saturations: PA 70 AO 92  Cardiac Output (Fick) 6.5  Cardiac Index (Fick) 3.1   Pulmonary vascular resistance (PVR): 0.9 Woods units.   Coronary angiography: Coronary dominance: Right   Left Main:  Normal in size and short. It is free of significant disease.  Left Anterior Descending (LAD):  Normal in size with no significant disease.  1st diagonal (D1):  Medium in size and free of significant disease.  2nd diagonal (D2):  Normal in size with minor irregularities.  3rd diagonal (D3):  Very small in size.  Circumflex (LCx):   Normal in size and nondominant. It is tortuous proximally. There is a 40% lesion in the midsegment.  1st obtuse marginal:  Small in size and free of  significant disease.  2nd obtuse marginal:  Normal in size and free of significant disease.  3rd obtuse marginal:  Normal in size and free of significant disease.   Right Coronary Artery: Normal in size and dominant. There is a 30-40% stenosis proximally. There is a 30% tubular disease in the midsegment at an RV branch. The rest of the vessel has no significant disease.  right ventricle branch of right coronary artery: Normal in size and free of significant disease.  posterior descending artery: Normal in size with minor irregularities.  posterior lateral branch:  No significant disease.  Left ventriculography: Was not performed due to inability to cross the aortic valve. Aortic root angiography: On fluoroscopy, the aortic valve is noted to be heavily calcified with significantly restricted opening. The aortic root and descending aortic angiography was performed in the LAO position and showed mild dilatation of the ascending aorta with minor atherosclerosis.  Final Conclusions:   1. Normal filling pressures without evidence of pulmonary hypertension. Normal cardiac output. 2. Mild nonobstructive coronary artery disease. 3. Heavily calcified aortic valve. The valve could not be crossed in spite of multiple attempts. Mean gradient by echo was 40 with a valve area of 0.7 cm.  Recommendations:  Aortic valve replacement is recommended. The patient already has a consultation scheduled with Dr. Dorris Fetch.  Lorine Bears MD, [facility] 07/31/2011, 11:42 AM

## 2011-07-31 NOTE — Interval H&P Note (Signed)
History and Physical Interval Note:  07/31/2011 10:38 AM  [name]  has presented today for surgery, with the diagnosis of chest pain  The various methods of treatment have been discussed with the patient. After consideration of risks, benefits and other options for treatment, the patient has consented to  Procedure(s) (LRB): JV LEFT AND RIGHT HEART CATHETERIZATION WITH CORONARY ANGIOGRAM (N/A) as a surgical intervention .  The patients' history has been reviewed, patient examined, no change in status, stable for surgery.  I have reviewed the patients' chart and labs.  Questions were answered to the patient's satisfaction.     Lorine Bears

## 2011-07-31 NOTE — H&P (View-Only) (Signed)
Allen Vasallo De Gent, MD, FACC  ABIM Board Certified in Adult Cardiovascular Medicine,Internal Medicine and Critical Care Medicine  CC: followup patient with bicuspid aortic valve and aortic stenosis  HPI:  The patient has known bicuspid aortic valve with previously mean gradient of 34 mmHg. He has developed progressive dyspnea and a few weeks ago he had a repeat echocardiogram done which now showed a mean pressure gradient of 40 mmHg across a calcified bicuspid aortic valve. Symptomatically the patient appears to have worsened with symptoms of chest pain and dyspnea on minimal exertion. However the patient has significant depression and therefore his symptoms of shortness of breath and chest pain particularly in the setting of fibromyalgia are little bit difficult to sort out how much this could be related to his underlying aortic stenosis.nevertheless his valvular heart disease appears to have progressed and the patient states that he is not able to walk up a flight of stairs without becoming very short of breath.previously been performed a TEE to establish that he had bicuspid aortic valve. His last cardiac catheterization was in 2010 when he was complaining of chest pain. He had nonobstructive coronary artery disease. Unfortunately he continues to smoke. The patient complains of diffuse aches and pains. He denies any orthopnea or PND he has no palpitations or presyncope or syncope.  PMH: reviewed and listed in Problem List in Electronic Records (and see below)  Past Medical History   Diagnosis  Date   .  Murmur      Trace mitral regurgitation by echocardiogram February 2012   .  Hyperlipidemia      mixed   .  Mitral regurgitation      New murmur at the apex consistent with mitral regurgitation   .  Aortic stenosis      Bicuspid aortic valve with mild-to-moderate aortic stenosis echocardiogram February 2012 mean gradient 34 mmHg, aortic valve area 0.7 cm, peak velocity 2.43 m/scomment by catheterization  mean gradient 18 mmHg December 2010 status post TEE   .  CAD (coronary artery disease)      native vessel, nonobstructive, by catheterization, last catheterization December 2010   .  Dyslipidemia    .  Major depressive disorder      wih prior suicidal attempts   .  Myocardial infarction  10 yrs ago   .  Stroke      12 yrs ago   .  Shortness of breath    .  Seizures      6 yrs ago from withdrawal of xanax to quickly.   .  Anxiety    .  Fibromyalgia     Past Surgical History   Procedure  Date   .  Inguinal exploration      right   .  Femoral hernia repair      x2   .  Umbilical hernia repair      [facility]   .  Finger contracture release      Tendon release operation on his right little finger   .  Appendectomy    .  Tonsillectomy    .  Cardiac catheterization    .  Cholecystectomy      [facility]   .  Incisional hernia repair  05/01/2011     Procedure: HERNIA REPAIR INCISIONAL; Surgeon: Mark A Jenkins, MD; Location: [facility]; Service: General; Laterality: N/A; with Mesh    Allergies/SH/FHX : available in Electronic Records for review  No Known Allergies  History    Social History   .    Marital Status:  Single     Spouse Name:  N/A     Number of Children:  N/A   .  Years of Education:  N/A    Occupational History   .  Disabled     Social History Main Topics   .  Smoking status:  Current Everyday Smoker -- 0.5 packs/day for 48 years     Types:  Cigarettes   .  Smokeless tobacco:  Never Used   .  Alcohol Use:  No   .  Drug Use:  No   .  Sexually Active:  Yes     Birth Control/ Protection:  None    Other Topics  Concern   .  Not on file    Social History Narrative   .  No narrative on file    Family History   Problem  Relation  Age of Onset   .  Cancer     .  Stroke     .  Anesthesia problems  Neg Hx    .  Hypotension  Neg Hx    .  Malignant hyperthermia  Neg Hx    .  Pseudochol deficiency  Neg Hx     Medications:  Current Outpatient Prescriptions   Medication  Sig   Dispense  Refill   .  acetaminophen (TYLENOL) 500 MG tablet  Take 500 mg by mouth every 6 (six) hours as needed. For pain     .  ALPRAZolam (XANAX) 1 MG tablet  Take 1 mg by mouth 5 (five) times daily.     .  aspirin EC 81 MG tablet  Take 81 mg by mouth daily.     .  HYDROcodone-acetaminophen (NORCO) 5-325 MG per tablet  Take 1 tablet by mouth every 8 (eight) hours as needed.     .  nitroGLYCERIN (NITROSTAT) 0.4 MG SL tablet  Place 0.4 mg under the tongue every 5 (five) minutes as needed. For chest pains     .  pantoprazole (PROTONIX) 40 MG tablet  Take 40 mg by mouth daily.     .  polyethylene glycol powder (MIRALAX) powder  Take 17 g by mouth daily.     .  promethazine (PHENERGAN) 25 MG tablet  Take 1 tablet by mouth 4 times daily as needed.     .  simvastatin (ZOCOR) 40 MG tablet  Take 40 mg by mouth at bedtime.     .  venlafaxine (EFFEXOR-XR) 150 MG 24 hr capsule  Take 150 mg by mouth 2 (two) times daily.      ROS: No nausea or vomiting. No fever or chills.No melena or hematochezia.No bleeding.No claudication  Physical Exam:  BP 112/81  Pulse 82  Ht 6' (1.829 m)  Wt 200 lb (90.719 kg)  BMI 27.12 kg/m2  SpO2 99%  General:well-nourished white male with depressive affect  Neck:pulses parvus and tardus i bilaterally with no carotid bruits. No thyromegaly and no nodular thyroid  Lungs:clear breath sounds bilaterally without wheezing  Cardiac:regular rate and rhythm with a 2/6 crescendo decrescendo murmur most prominent at the right upper sternal border. There is some decrease in intensity of S2 but this is not particularly marked. S1 is normal. There is no S3 or S4. No other pathological murmurs  Vascular:no edema. Normal distal pulses bilaterally  Skin:warm and dry  Physcologic:depressed affect  12lead ECG:not obtained  Limited bedside ECHO:N/A  No images are attached to the encounter.  Assessment and Plan  Aortic stenosis    followup echocardiogram in April 2013 showed increased in  severity of aortic stenosis secondary to bicuspid aortic valve. Mean pressure gradient of 40 mmHg with a calculated valve area of 0.17 cm. Peak velocity ratio LVOT 2 aortic valve is 0.23 valve is bicuspid and moderately calcified or significant turbulent transvalvular flow.the patient will be referred for left and right heart catheterization and for further consultation with cardiac surgery for valve replacement. Although his symptoms are clouded by depression, fibromyalgia he does appear after obtaining a careful history that he has worsened shortness of breath. He states that the shortness of breath just climbing up one flight of stairs. He has to do everything quite slowly because of dyspnea. He occasionally experiences chest pain and he uses nitroglycerin. His chest pain however antedates his last cardiac catheterization December 2010.  CAD (coronary artery disease)  Nonobstructive coronary artery disease by catheterization in December 2010 performed by Dr. Hock crying. The patient had normal LV function and the left main coronary artery was normal LAD was large and wrapping around the apex. The circumflex had a 25% stenosis in the AV groove and obtuse marginal branch which was large had a proximal 60% stenosis. The right coronary artery was a dominant vessel and it was a long proximal 40% stenosis and a 30% mid stenosis.unfortunately the patient continues to smoke.  HYPERLIPIDEMIA-MIXED  followup by the patient's primary care physician  Mitral regurgitation  No evidence of significant mitral regurgitation by most recent echocardiogram. Mitral regurgitation was labeled as trivial.  Major depressive disorder  Continues to be a problem even on Effexor.  Fibromyalgia  Switch from tramadol to hydrocodone by his primary care physician  DYSPNEA  Likely secondary to underlying aortic stenosis.  Patient Active Problem List   Diagnoses   .  HYPERLIPIDEMIA-MIXED   .  DEPRESSION, MAJOR   .  CONSTIPATION     .  DYSPNEA   .  CHEST PAIN, PLEURITIC   .  Mitral regurgitation   .  CAD (coronary artery disease)   .  Aortic stenosis   .  Major depressive disorder   .  Fibromyalgia     

## 2011-07-31 NOTE — Progress Notes (Signed)
Bedrest begins @ 1200. 

## 2011-08-07 ENCOUNTER — Encounter (HOSPITAL_COMMUNITY): Payer: Self-pay | Admitting: Pharmacy Technician

## 2011-08-07 ENCOUNTER — Other Ambulatory Visit: Payer: Self-pay | Admitting: Thoracic Surgery (Cardiothoracic Vascular Surgery)

## 2011-08-07 ENCOUNTER — Institutional Professional Consult (permissible substitution) (INDEPENDENT_AMBULATORY_CARE_PROVIDER_SITE_OTHER): Payer: Medicare Other | Admitting: Thoracic Surgery (Cardiothoracic Vascular Surgery)

## 2011-08-07 ENCOUNTER — Encounter: Payer: Self-pay | Admitting: Thoracic Surgery (Cardiothoracic Vascular Surgery)

## 2011-08-07 ENCOUNTER — Other Ambulatory Visit: Payer: Self-pay

## 2011-08-07 VITALS — BP 122/86 | HR 84 | Resp 16 | Ht 72.0 in | Wt 205.0 lb

## 2011-08-07 DIAGNOSIS — I359 Nonrheumatic aortic valve disorder, unspecified: Secondary | ICD-10-CM

## 2011-08-07 DIAGNOSIS — I350 Nonrheumatic aortic (valve) stenosis: Secondary | ICD-10-CM

## 2011-08-07 NOTE — Progress Notes (Signed)
PCP is Donzetta Sprung, MD, MD Referring Provider is de Melanie Crazier, MD  Chief Complaint  Patient presents with  . Aortic Stenosis    eval and treat...echo 07/10/11...cathed 07/31/11    HPI: [name] is a 61 year old gentleman with a known bicuspid aortic valve who presents with chief complaint of chest pain. [name] has had a heart murmur since childhood and has been diagnosed with a bicuspid aortic valve. He has been followed with serial echocardiograms over the past several years. He recently been having problems with chest pain which she describes as a tightness or pressure with exertion, this is associated with shortness of breath. He would get this with walking up one flight of stairs or less of a quarter mile on level ground. He also is been having complaints of dizziness although no frank syncope. He also complains of shortness of breath with exertion as well as orthopnea. Recent echocardiogram showed progression of his aortic stenosis with an mean gradient of 40 mm of mercury and a valve area of 0.7 cm. He underwent cardiac catheterization on 07/31/2011 which showed some atherosclerotic plaque in the circumflex and right coronary, but no hemodynamically significant lesions. The aortic valve could not be crossed at the time of catheterization.  Past Medical History  Diagnosis Date  . Murmur     Trace mitral regurgitation by echocardiogram February 2012  . Hyperlipidemia     mixed  . Mitral regurgitation     New murmur at the apex consistent with mitral regurgitation  . Aortic stenosis     Bicuspid aortic valve with mild-to-moderate aortic stenosis echocardiogram February 2012 mean gradient 34 mmHg, aortic valve area 0.7 cm, peak velocity 2.43 m/scomment by catheterization mean gradient 18 mmHg December 2010 status post TEE  . CAD (coronary artery disease)     native vessel, nonobstructive, by catheterization, last catheterization December 2010  . Dyslipidemia   . Major depressive  disorder     wih prior suicidal attempts  . Myocardial infarction 10 yrs ago  . Stroke     12 yrs ago  . Shortness of breath   . Seizures     6 yrs ago from withdrawal of xanax to quickly.  . Anxiety   . Fibromyalgia     Past Surgical History  Procedure Date  . Inguinal exploration     right  . Femoral hernia repair     x2  . Umbilical hernia repair     [facility]  . Finger contracture release     Tendon release operation on his right little finger  . Appendectomy   . Tonsillectomy   . Cardiac catheterization   . Cholecystectomy     [facility]  . Incisional hernia repair 05/01/2011    Procedure: HERNIA REPAIR INCISIONAL;  Surgeon: Dalia Heading, MD;  Location: [facility];  Service: General;  Laterality: N/A;  with Mesh    Family History  Problem Relation Age of Onset  . Cancer    . Stroke    . Anesthesia problems Neg Hx   . Hypotension Neg Hx   . Malignant hyperthermia Neg Hx   . Pseudochol deficiency Neg Hx     Social History History  Substance Use Topics  . Smoking status: Current Everyday Smoker -- 0.5 packs/day for 48 years    Types: Cigarettes  . Smokeless tobacco: Never Used  . Alcohol Use: No    Current Outpatient Prescriptions  Medication Sig Dispense Refill  . acetaminophen (TYLENOL) 500 MG tablet  Take 500 mg by mouth every 6 (six) hours as needed. For pain       . ALPRAZolam (XANAX) 1 MG tablet Take 1 mg by mouth 5 (five) times daily.        [name] aspirin EC 81 MG tablet Take 81 mg by mouth daily.      [name] HYDROcodone-acetaminophen (NORCO) 5-325 MG per tablet Take 1 tablet by mouth every 8 (eight) hours as needed. For back and leg pain...arthritis      . nitroGLYCERIN (NITROSTAT) 0.4 MG SL tablet Place 0.4 mg under the tongue every 5 (five) minutes as needed. For chest pains      . pantoprazole (PROTONIX) 40 MG tablet Take 40 mg by mouth daily.        . polyethylene glycol powder (MIRALAX) powder Take 17 g by mouth daily.        . promethazine (PHENERGAN) 25 MG tablet  Take 1 tablet by mouth 4 times daily as needed.      . simvastatin (ZOCOR) 40 MG tablet Take 40 mg by mouth at bedtime.        [name] venlafaxine (EFFEXOR-XR) 150 MG 24 hr capsule Take 150 mg by mouth 2 (two) times daily.       . mupirocin ointment (BACTROBAN) 2 %         No Known Allergies  Review of Systems  Constitutional: Positive for appetite change and unexpected weight change (wt gain).  HENT:       Full dentures  Eyes: Positive for visual disturbance.  Cardiovascular: Positive for chest pain and leg swelling. Negative for palpitations.       Claudication, heart murmur, orthopnea, SOB with exertion  Gastrointestinal:       Abdominal pain, constipation  Genitourinary: Positive for dysuria and frequency.  Musculoskeletal: Positive for arthralgias.  Neurological: Positive for dizziness and seizures (2008).       Ministroke 10 yrs ago, double vision  Psychiatric/Behavioral: Positive for dysphoric mood.  All other systems reviewed and are negative.    BP 122/86  Pulse 84  Resp 16  Ht 6' (1.829 m)  Wt 205 lb (92.987 kg)  BMI 27.80 kg/m2  SpO2 98% Physical Exam  Constitutional: He is oriented to person, place, and time. He appears well-developed and well-nourished. No distress.  HENT:  Head: Normocephalic and atraumatic.  Eyes: EOM are normal. Pupils are equal, round, and reactive to light.  Neck: Neck supple. No JVD present. No thyromegaly present.       Bruits versus transmitted murmur bilaterally  Cardiovascular: Normal rate and regular rhythm.   Murmur (3/6 systolic murmur) heard. Pulmonary/Chest: Effort normal. No respiratory distress. He has no wheezes. He has no rales.  Abdominal: Soft. There is no tenderness.  Musculoskeletal: Normal range of motion. He exhibits no edema.  Lymphadenopathy:    He has no cervical adenopathy.  Neurological: He is alert and oriented to person, place, and time. No cranial nerve deficit.  Skin: Skin is warm and dry.     Diagnostic  Tests: Echocardiogram report and cardiac catheterization films reviewed findings as previously noted  Impression: 61 year old gentleman with severe aortic stenosis which is very symptomatic with chest pain, shortness of breath, and dizziness. Aortic valve replacement is indicated for survival benefit as well as relief of symptoms.  I discussed with him the options for aortic valve replacement including mechanical and tissue valves. He understands the relative advantages and disadvantages of each as it relates to need for lifelong anticoagulation and  valve life span. He has a strong preference for a mechanical valve. He does understand that he would be lifelong anticoagulation with Coumadin and that he would have to watch his diet and have his blood test on a regular basis. He has a cousin who had a mechanical valve replacement 7 years ago and has been doing well and, therefore, [name] is familiar with the demands associated with Coumadin.  I discussed with him surgical approaches including a traditional median sternotomy or a parasternal approach. He prefers median sternotomy given that it is the more standard approach.  We discussed the natural history of aortic stenosis and the indications for replacement. We discussed the risks and benefits as well as alternative treatments. He understands that the risks of surgery include but are not limited to death, MI, stroke, DVT, PE, bleeding, possible need for transfusion, infection, heart block requiring permanent pacemaker, arrhythmias, and other organ system dysfunction including respiratory, renal, or GI complications. He understands and accepts these risks and agrees to proceed.  Plan: Mechanical aortic valve replacement on Thursday, 08/15/2011.

## 2011-08-08 ENCOUNTER — Telehealth: Payer: Self-pay | Admitting: Physician Assistant

## 2011-08-08 NOTE — Telephone Encounter (Signed)
[name] has a post Cath fu with [name] on Friday, May 10th. He saw Dr.Henderickson on 5-8 . Surgery has Been scheduled for 05-14. [name] wants to know if he needs to keep this appointment.

## 2011-08-08 NOTE — Telephone Encounter (Signed)
No per Gene. Will call patient to cancel this appointment

## 2011-08-09 ENCOUNTER — Encounter: Payer: PRIVATE HEALTH INSURANCE | Admitting: Physician Assistant

## 2011-08-13 ENCOUNTER — Other Ambulatory Visit (HOSPITAL_COMMUNITY): Payer: Self-pay

## 2011-08-13 ENCOUNTER — Ambulatory Visit (HOSPITAL_COMMUNITY)
Admission: RE | Admit: 2011-08-13 | Discharge: 2011-08-13 | Disposition: A | Discharge status: Home / Self Care | Payer: PRIVATE HEALTH INSURANCE | Source: Ambulatory Visit | Attending: Thoracic Surgery (Cardiothoracic Vascular Surgery) | Admitting: Thoracic Surgery (Cardiothoracic Vascular Surgery)

## 2011-08-13 ENCOUNTER — Encounter (HOSPITAL_COMMUNITY)
Admission: RE | Admit: 2011-08-13 | Discharge: 2011-08-13 | Disposition: A | Discharge status: Home / Self Care | Payer: PRIVATE HEALTH INSURANCE | Source: Ambulatory Visit | Attending: Thoracic Surgery (Cardiothoracic Vascular Surgery) | Admitting: Thoracic Surgery (Cardiothoracic Vascular Surgery)

## 2011-08-13 ENCOUNTER — Inpatient Hospital Stay (HOSPITAL_COMMUNITY)
Admission: RE | Admit: 2011-08-13 | Discharge: 2011-08-13 | Disposition: A | Discharge status: Home / Self Care | Payer: PRIVATE HEALTH INSURANCE | Source: Ambulatory Visit | Attending: Thoracic Surgery (Cardiothoracic Vascular Surgery) | Admitting: Thoracic Surgery (Cardiothoracic Vascular Surgery)

## 2011-08-13 ENCOUNTER — Other Ambulatory Visit (HOSPITAL_COMMUNITY): Payer: PRIVATE HEALTH INSURANCE

## 2011-08-13 ENCOUNTER — Encounter (HOSPITAL_COMMUNITY): Payer: Self-pay

## 2011-08-13 VITALS — BP 126/89 | HR 67 | Temp 97.0°F | Resp 20 | Ht 72.0 in | Wt 203.0 lb

## 2011-08-13 DIAGNOSIS — Z01810 Encounter for preprocedural cardiovascular examination: Secondary | ICD-10-CM

## 2011-08-13 DIAGNOSIS — I359 Nonrheumatic aortic valve disorder, unspecified: Secondary | ICD-10-CM

## 2011-08-13 DIAGNOSIS — Z01818 Encounter for other preprocedural examination: Secondary | ICD-10-CM | POA: Insufficient documentation

## 2011-08-13 DIAGNOSIS — Z01812 Encounter for preprocedural laboratory examination: Secondary | ICD-10-CM | POA: Insufficient documentation

## 2011-08-13 HISTORY — DX: Unspecified osteoarthritis, unspecified site: M19.90

## 2011-08-13 HISTORY — DX: Peripheral vascular disease, unspecified: I73.9

## 2011-08-13 HISTORY — DX: Other chronic pain: G89.29

## 2011-08-13 HISTORY — DX: Dorsalgia, unspecified: M54.9

## 2011-08-13 HISTORY — DX: Personal history of colon polyps, unspecified: Z86.0100

## 2011-08-13 HISTORY — DX: Personal history of colonic polyps: Z86.010

## 2011-08-13 HISTORY — DX: Gastro-esophageal reflux disease without esophagitis: K21.9

## 2011-08-13 HISTORY — DX: Other amnesia: R41.3

## 2011-08-13 HISTORY — DX: Nocturia: R35.1

## 2011-08-13 LAB — URINALYSIS, ROUTINE W REFLEX MICROSCOPIC
Bilirubin Urine: NEGATIVE
Glucose, UA: NEGATIVE mg/dL
Hgb urine dipstick: NEGATIVE
Ketones, ur: NEGATIVE mg/dL
Protein, ur: NEGATIVE mg/dL

## 2011-08-13 LAB — PULMONARY FUNCTION TEST

## 2011-08-13 LAB — HEMOGLOBIN A1C: Mean Plasma Glucose: 128 mg/dL — ABNORMAL HIGH (ref <117)

## 2011-08-13 LAB — CBC
Platelets: 230 10*3/uL (ref 150–400)
RBC: 4.49 MIL/uL (ref 4.22–5.81)
WBC: 8 10*3/uL (ref 4.0–10.5)

## 2011-08-13 LAB — ABO/RH: ABO/RH(D): A NEG

## 2011-08-13 LAB — COMPREHENSIVE METABOLIC PANEL
AST: 22 U/L (ref 0–37)
Albumin: 4 g/dL (ref 3.5–5.2)
Chloride: 103 mEq/L (ref 96–112)
Creatinine, Ser: 0.9 mg/dL (ref 0.50–1.35)
Total Bilirubin: 0.1 mg/dL — ABNORMAL LOW (ref 0.3–1.2)
Total Protein: 7.7 g/dL (ref 6.0–8.3)

## 2011-08-13 LAB — PROTIME-INR
INR: 0.94 (ref 0.00–1.49)
Prothrombin Time: 12.8 seconds (ref 11.6–15.2)

## 2011-08-13 LAB — SURGICAL PCR SCREEN: Staphylococcus aureus: NEGATIVE

## 2011-08-13 MED ORDER — ALBUTEROL SULFATE (5 MG/ML) 0.5% IN NEBU
2.5000 mg | INHALATION_SOLUTION | Freq: Once | RESPIRATORY_TRACT | Status: AC
  Administered 2011-08-13: 2.5 mg via RESPIRATORY_TRACT

## 2011-08-13 NOTE — Progress Notes (Signed)
Dr.Degent is cardiologist with last visit in epic from 07/30/11  Stress test done @ [facility]-to request report  Pt states he has had an echo every yr for the past 38yrs-last 3 in epic(most recent one in 2013)  Medical MD is Dr.Daniel with [facility]

## 2011-08-13 NOTE — Pre-Procedure Instructions (Signed)
21 [name]  08/13/2011   Your procedure is scheduled on:  Thurs, May 16 @ 7:30 AM  Report to [facility] at 5:30 AM.  Call this number if you have problems the morning of surgery: [phone]   Remember:   Do not eat food:After Midnight.  May have clear liquids: up to 4 Hours before arrival.(until 1:30 am)  Clear liquids include soda, tea, black coffee, apple or grape juice, broth,water  Take these medicines the morning of surgery with A SIP OF WATER: Xanax,Pain Pill(if needed),Protonix,Phenergan(if needed),and Effexor   Do not wear jewelry  Do not wear lotions, powders, or cologne  Do not bring valuables to the hospital.  Contacts, dentures or bridgework may not be worn into surgery.  Leave suitcase in the car. After surgery it may be brought to your room.  For patients admitted to the hospital, checkout time is 11:00 AM the day of discharge.   Special Instructions: CHG Shower Use Special Wash: 1/2 bottle night before surgery and 1/2 bottle morning of surgery.   Please read over the following fact sheets that you were given: Pain Booklet, Coughing and Deep Breathing, Blood Transfusion Information, Open Heart Packet, MRSA Information and Surgical Site Infection Prevention

## 2011-08-13 NOTE — Progress Notes (Signed)
3-4 days ago pt had to take Ntg(2) for relief;chest pain

## 2011-08-13 NOTE — Progress Notes (Addendum)
Spoke with Dr.Hendrickson's nurse and notified of pt experiencing angina 3days ago,had to take 2 Ntg to relieve discomfort;will let Dr.Hendrickson know and follow up with pt as needed

## 2011-08-14 MED ORDER — DEXTROSE 5 % IV SOLN
750.0000 mg | INTRAVENOUS | Status: DC
  Filled 2011-08-14: qty 750

## 2011-08-14 MED ORDER — EPINEPHRINE HCL 1 MG/ML IJ SOLN
0.5000 ug/min | INTRAVENOUS | Status: DC
  Filled 2011-08-14: qty 4

## 2011-08-14 MED ORDER — TRANEXAMIC ACID (OHS) PUMP PRIME SOLUTION
2.0000 mg/kg | INTRAVENOUS | Status: DC
  Filled 2011-08-14: qty 1.84

## 2011-08-14 MED ORDER — POTASSIUM CHLORIDE 2 MEQ/ML IV SOLN
80.0000 meq | INTRAVENOUS | Status: DC
  Filled 2011-08-14: qty 40

## 2011-08-14 MED ORDER — MAGNESIUM SULFATE 50 % IJ SOLN
40.0000 meq | INTRAMUSCULAR | Status: DC
  Filled 2011-08-14: qty 10

## 2011-08-14 MED ORDER — METOPROLOL TARTRATE 12.5 MG HALF TABLET
12.5000 mg | ORAL_TABLET | Freq: Once | ORAL | Status: AC
  Administered 2011-08-15: 12.5 mg via ORAL
  Filled 2011-08-14: qty 1

## 2011-08-14 MED ORDER — DEXTROSE 5 % IV SOLN
1.5000 g | INTRAVENOUS | Status: DC
  Filled 2011-08-14: qty 1.5

## 2011-08-14 MED ORDER — SODIUM BICARBONATE 8.4 % IV SOLN
INTRAVENOUS | Status: AC
  Administered 2011-08-15: 09:00:00
  Filled 2011-08-14: qty 2.5

## 2011-08-14 MED ORDER — DOPAMINE-DEXTROSE 3.2-5 MG/ML-% IV SOLN
2.0000 ug/kg/min | INTRAVENOUS | Status: DC
  Administered 2011-08-15: 3 ug/kg/min via INTRAVENOUS
  Filled 2011-08-14: qty 250

## 2011-08-14 MED ORDER — TRANEXAMIC ACID 100 MG/ML IV SOLN
1.5000 mg/kg/h | INTRAVENOUS | Status: AC
  Administered 2011-08-15: 1.5 mg/kg/h via INTRAVENOUS
  Filled 2011-08-14: qty 25

## 2011-08-14 MED ORDER — SODIUM CHLORIDE 0.9 % IV SOLN
0.1000 ug/kg/h | INTRAVENOUS | Status: DC
  Filled 2011-08-14: qty 4

## 2011-08-14 MED ORDER — TRANEXAMIC ACID (OHS) BOLUS VIA INFUSION
15.0000 mg/kg | INTRAVENOUS | Status: AC
  Administered 2011-08-15: 1381.5 mg via INTRAVENOUS
  Filled 2011-08-14: qty 1382

## 2011-08-14 MED ORDER — PHENYLEPHRINE HCL 10 MG/ML IJ SOLN
30.0000 ug/min | INTRAVENOUS | Status: DC
  Filled 2011-08-14: qty 2

## 2011-08-14 MED ORDER — SODIUM CHLORIDE 0.9 % IV SOLN
INTRAVENOUS | Status: DC
  Administered 2011-08-15: 1.4 [IU]/h via INTRAVENOUS
  Filled 2011-08-14: qty 1

## 2011-08-14 MED ORDER — VANCOMYCIN HCL 1000 MG IV SOLR
1500.0000 mg | INTRAVENOUS | Status: DC
  Filled 2011-08-14: qty 1500

## 2011-08-14 MED ORDER — NITROGLYCERIN IN D5W 200-5 MCG/ML-% IV SOLN
2.0000 ug/min | INTRAVENOUS | Status: DC
  Filled 2011-08-14: qty 250

## 2011-08-14 NOTE — Consult Note (Signed)
Anesthesia Chart Review: Patient is a 61 year old male scheduled for AVR on 08/15/11 for severe, symptomatic AS with CP, SOB, and dizziness.  Other history includes smoking, trivial MR/TR (07/2011), non-obstructive CAD (07/2011), fibromyalgia, seizures, anxiety, HLD, PVD, CVA, GERD, depression, memory loss.  Primary Cardiologist is Dr. Andee Lineman.  EKG from 08/13/11 showed NSR, right BBB.  Cardiac cath from 07/31/11 showed: 1. Normal filling pressures without evidence of pulmonary hypertension. Normal cardiac output.  2. Mild nonobstructive coronary artery disease.  3. Heavily calcified aortic valve. The valve could not be crossed in spite of multiple attempts. Mean gradient by echo was 40 with a valve area of 0.7 cm.  2D echo from 07/10/11 showed: - Left ventricle: The cavity size was normal. Wall thickness was increased in a pattern of moderate LVH. Systolic function was vigorous. The estimated ejection fraction was in the range of 65% to 70%. Wall motion was normal; there were no regional wall motion abnormalities. Doppler parameters are consistent with abnormal left ventricular relaxation (grade 1 diastolic dysfunction). - Aortic valve: Probably bicuspid, not well defined, asymmetricleaflets; moderately calcified leaflets. Cusp separation was reduced with turbulent transvalvular flow. There was severe stenosis. No significant regurgitation. Mean gradient: 40mm Hg (S). Valve area: 0.73cm^2(VTI). Peak velocity ratio of LVOT to aortic valve: 0.23. Valve area: 0.8cm^2 (Vmax). - Aortic root: The aortic root was normal in size. - Mitral valve: Mildly thickened leaflets . Trivial regurgitation. - Tricuspid valve: Trivial regurgitation. - Pericardium, extracardiac: There was no pericardial Effusion.  CXR from 08/13/11 showed no acute cardiopulmonary process.  Labs noted.  ABG from PAT was venous, so it is to be redrawn on arrival on 08/15/11.  Plan to proceed.  Shonna Chock, PA-C

## 2011-08-15 ENCOUNTER — Encounter (HOSPITAL_COMMUNITY): Payer: Self-pay | Admitting: *Deleted

## 2011-08-15 ENCOUNTER — Ambulatory Visit (HOSPITAL_COMMUNITY): Payer: PRIVATE HEALTH INSURANCE | Admitting: Vascular Surgery

## 2011-08-15 ENCOUNTER — Inpatient Hospital Stay (HOSPITAL_COMMUNITY): Payer: PRIVATE HEALTH INSURANCE

## 2011-08-15 ENCOUNTER — Encounter (HOSPITAL_COMMUNITY)
Admission: RE | Disposition: A | Discharge status: Home Health | Payer: Self-pay | Source: Ambulatory Visit | Attending: Thoracic Surgery (Cardiothoracic Vascular Surgery)

## 2011-08-15 ENCOUNTER — Encounter (HOSPITAL_COMMUNITY): Payer: Self-pay | Admitting: Vascular Surgery

## 2011-08-15 ENCOUNTER — Inpatient Hospital Stay (HOSPITAL_COMMUNITY)
Admission: RE | Admit: 2011-08-15 | Discharge: 2011-08-28 | DRG: 220 | Disposition: A | Discharge status: Home Health | Payer: PRIVATE HEALTH INSURANCE | Source: Ambulatory Visit | Attending: Thoracic Surgery (Cardiothoracic Vascular Surgery) | Admitting: Thoracic Surgery (Cardiothoracic Vascular Surgery)

## 2011-08-15 DIAGNOSIS — Z952 Presence of prosthetic heart valve: Secondary | ICD-10-CM

## 2011-08-15 DIAGNOSIS — F17200 Nicotine dependence, unspecified, uncomplicated: Secondary | ICD-10-CM | POA: Diagnosis present

## 2011-08-15 DIAGNOSIS — I2510 Atherosclerotic heart disease of native coronary artery without angina pectoris: Secondary | ICD-10-CM | POA: Diagnosis present

## 2011-08-15 DIAGNOSIS — I359 Nonrheumatic aortic valve disorder, unspecified: Principal | ICD-10-CM | POA: Diagnosis present

## 2011-08-15 DIAGNOSIS — R5381 Other malaise: Secondary | ICD-10-CM | POA: Diagnosis present

## 2011-08-15 DIAGNOSIS — J9819 Other pulmonary collapse: Secondary | ICD-10-CM | POA: Diagnosis present

## 2011-08-15 DIAGNOSIS — E785 Hyperlipidemia, unspecified: Secondary | ICD-10-CM | POA: Diagnosis present

## 2011-08-15 DIAGNOSIS — E119 Type 2 diabetes mellitus without complications: Secondary | ICD-10-CM | POA: Diagnosis present

## 2011-08-15 DIAGNOSIS — IMO0001 Reserved for inherently not codable concepts without codable children: Secondary | ICD-10-CM | POA: Diagnosis present

## 2011-08-15 DIAGNOSIS — D62 Acute posthemorrhagic anemia: Secondary | ICD-10-CM | POA: Diagnosis not present

## 2011-08-15 DIAGNOSIS — F341 Dysthymic disorder: Secondary | ICD-10-CM | POA: Diagnosis present

## 2011-08-15 DIAGNOSIS — I252 Old myocardial infarction: Secondary | ICD-10-CM

## 2011-08-15 HISTORY — PX: AORTIC VALVE REPLACEMENT: SHX41

## 2011-08-15 LAB — BLOOD GAS, ARTERIAL
Acid-Base Excess: 0.6 mmol/L (ref 0.0–2.0)
Bicarbonate: 24.8 mEq/L — ABNORMAL HIGH (ref 20.0–24.0)
FIO2: 0.21 %
O2 Saturation: 96.2 %
Patient temperature: 98.6
TCO2: 26 mmol/L (ref 0–100)

## 2011-08-15 LAB — POCT I-STAT 3, ART BLOOD GAS (G3+)
Acid-base deficit: 4 mmol/L — ABNORMAL HIGH (ref 0.0–2.0)
Acid-base deficit: 2 mmol/L (ref 0.0–2.0)
Acid-base deficit: 2 mmol/L (ref 0.0–2.0)
Bicarbonate: 25.6 mEq/L — ABNORMAL HIGH (ref 20.0–24.0)
Bicarbonate: 24.5 mEq/L — ABNORMAL HIGH (ref 20.0–24.0)
Bicarbonate: 23.7 mEq/L (ref 20.0–24.0)
O2 Saturation: 100 %
O2 Saturation: 84 %
Patient temperature: 34.7
Patient temperature: 37.2
Patient temperature: 37
TCO2: 25 mmol/L (ref 0–100)
TCO2: 23 mmol/L (ref 0–100)
pCO2 arterial: 44.5 mmHg (ref 35.0–45.0)
pCO2 arterial: 42.6 mmHg (ref 35.0–45.0)
pH, Arterial: 7.3 — ABNORMAL LOW (ref 7.350–7.450)
pH, Arterial: 7.348 — ABNORMAL LOW (ref 7.350–7.450)
pO2, Arterial: 380 mmHg — ABNORMAL HIGH (ref 80.0–100.0)

## 2011-08-15 LAB — CBC
HCT: 28.4 % — ABNORMAL LOW (ref 39.0–52.0)
Hemoglobin: 9.6 g/dL — ABNORMAL LOW (ref 13.0–17.0)
MCHC: 33.8 g/dL (ref 30.0–36.0)
MCV: 94.4 fL (ref 78.0–100.0)
Platelets: 127 10*3/uL — ABNORMAL LOW (ref 150–400)
Platelets: 101 10*3/uL — ABNORMAL LOW (ref 150–400)
RBC: 3.01 MIL/uL — ABNORMAL LOW (ref 4.22–5.81)
RBC: 2.24 MIL/uL — ABNORMAL LOW (ref 4.22–5.81)
WBC: 18.7 10*3/uL — ABNORMAL HIGH (ref 4.0–10.5)
WBC: 8.6 10*3/uL (ref 4.0–10.5)

## 2011-08-15 LAB — POCT I-STAT 4, (NA,K, GLUC, HGB,HCT)
Glucose, Bld: 91 mg/dL (ref 70–99)
Glucose, Bld: 98 mg/dL (ref 70–99)
Glucose, Bld: 155 mg/dL — ABNORMAL HIGH (ref 70–99)
Glucose, Bld: 128 mg/dL — ABNORMAL HIGH (ref 70–99)
HCT: 36 % — ABNORMAL LOW (ref 39.0–52.0)
HCT: 25 % — ABNORMAL LOW (ref 39.0–52.0)
HCT: 29 % — ABNORMAL LOW (ref 39.0–52.0)
Hemoglobin: 11.9 g/dL — ABNORMAL LOW (ref 13.0–17.0)
Hemoglobin: 9.9 g/dL — ABNORMAL LOW (ref 13.0–17.0)
Hemoglobin: 9.9 g/dL — ABNORMAL LOW (ref 13.0–17.0)
Hemoglobin: 11.2 g/dL — ABNORMAL LOW (ref 13.0–17.0)
Potassium: 3.4 mEq/L — ABNORMAL LOW (ref 3.5–5.1)
Potassium: 3.7 mEq/L (ref 3.5–5.1)
Potassium: 4.3 mEq/L (ref 3.5–5.1)
Sodium: 144 mEq/L (ref 135–145)
Sodium: 138 mEq/L (ref 135–145)
Sodium: 140 mEq/L (ref 135–145)

## 2011-08-15 LAB — POCT I-STAT, CHEM 8
BUN: 6 mg/dL (ref 6–23)
Calcium, Ion: 1.18 mmol/L (ref 1.12–1.32)
Creatinine, Ser: 0.7 mg/dL (ref 0.50–1.35)
TCO2: 24 mmol/L (ref 0–100)

## 2011-08-15 LAB — CREATININE, SERUM
Creatinine, Ser: 0.77 mg/dL (ref 0.50–1.35)
GFR calc Af Amer: >90 mL/min (ref >90)
GFR calc non Af Amer: >90 mL/min (ref >90)

## 2011-08-15 LAB — MAGNESIUM: Magnesium: 2.6 mg/dL — ABNORMAL HIGH (ref 1.5–2.5)

## 2011-08-15 LAB — PROTIME-INR: Prothrombin Time: 21.3 seconds — ABNORMAL HIGH (ref 11.6–15.2)

## 2011-08-15 LAB — PLATELET COUNT: Platelets: 159 10*3/uL (ref 150–400)

## 2011-08-15 SURGERY — REPLACEMENT, AORTIC VALVE, OPEN
Anesthesia: General | Site: Chest | Wound class: Clean

## 2011-08-15 MED ORDER — ACETAMINOPHEN 500 MG PO TABS
1000.0000 mg | ORAL_TABLET | Freq: Four times a day (QID) | ORAL | Status: DC
  Administered 2011-08-15 – 2011-08-19 (×15): 1000 mg via ORAL
  Filled 2011-08-15 (×17): qty 2

## 2011-08-15 MED ORDER — DEXTROSE 5 % IV SOLN
1.5000 g | Freq: Two times a day (BID) | INTRAVENOUS | Status: AC
  Administered 2011-08-15 – 2011-08-17 (×4): 1.5 g via INTRAVENOUS
  Filled 2011-08-15 (×4): qty 1.5

## 2011-08-15 MED ORDER — SODIUM CHLORIDE 0.9 % IV SOLN
0.1000 ug/kg/h | INTRAVENOUS | Status: DC
  Filled 2011-08-15 (×4): qty 2

## 2011-08-15 MED ORDER — DEXMEDETOMIDINE HCL 100 MCG/ML IV SOLN
0.1000 ug/kg/h | INTRAVENOUS | Status: DC
  Administered 2011-08-15: 0.2 ug/kg/h via INTRAVENOUS

## 2011-08-15 MED ORDER — BISACODYL 10 MG RE SUPP: 10.0000 mg | Freq: Every day | RECTAL | Status: DC

## 2011-08-15 MED ORDER — LIDOCAINE HCL (CARDIAC) 20 MG/ML IV SOLN
INTRAVENOUS | Status: DC | PRN
  Administered 2011-08-15: 100 mg via INTRAVENOUS

## 2011-08-15 MED ORDER — MAGNESIUM SULFATE 40 MG/ML IJ SOLN
4.0000 g | Freq: Once | INTRAMUSCULAR | Status: AC
  Administered 2011-08-15: 4 g via INTRAVENOUS
  Filled 2011-08-15: qty 100

## 2011-08-15 MED ORDER — DOPAMINE-DEXTROSE 1.6-5 MG/ML-% IV SOLN
INTRAVENOUS | Status: DC | PRN
  Administered 2011-08-15: 3 ug/kg/min via INTRAVENOUS

## 2011-08-15 MED ORDER — ACETAMINOPHEN 650 MG RE SUPP: 650.0000 mg | RECTAL | Status: AC

## 2011-08-15 MED ORDER — ARTIFICIAL TEARS OP OINT
TOPICAL_OINTMENT | OPHTHALMIC | Status: DC | PRN
  Administered 2011-08-15: 1 via OPHTHALMIC

## 2011-08-15 MED ORDER — SODIUM CHLORIDE 0.9 % IV SOLN: 250.0000 mL | INTRAVENOUS | Status: DC

## 2011-08-15 MED ORDER — SODIUM CHLORIDE 0.9 % IV SOLN
1000.0000 mg | INTRAVENOUS | Status: DC | PRN
  Administered 2011-08-15: 1500 mg via INTRAVENOUS

## 2011-08-15 MED ORDER — CHLORHEXIDINE GLUCONATE 4 % EX LIQD: 30.0000 mL | CUTANEOUS | Status: DC

## 2011-08-15 MED ORDER — FENTANYL CITRATE 0.05 MG/ML IJ SOLN
INTRAMUSCULAR | Status: DC | PRN
  Administered 2011-08-15 (×2): 250 ug via INTRAVENOUS
  Administered 2011-08-15: 100 ug via INTRAVENOUS
  Administered 2011-08-15: 150 ug via INTRAVENOUS
  Administered 2011-08-15: 50 ug via INTRAVENOUS
  Administered 2011-08-15: 250 ug via INTRAVENOUS
  Administered 2011-08-15: 450 ug via INTRAVENOUS

## 2011-08-15 MED ORDER — ASPIRIN EC 325 MG PO TBEC
325.0000 mg | DELAYED_RELEASE_TABLET | Freq: Every day | ORAL | Status: DC
  Administered 2011-08-16 – 2011-08-17 (×2): 325 mg via ORAL
  Filled 2011-08-15 (×2): qty 1

## 2011-08-15 MED ORDER — BISACODYL 5 MG PO TBEC
10.0000 mg | DELAYED_RELEASE_TABLET | Freq: Every day | ORAL | Status: DC
  Administered 2011-08-16 – 2011-08-20 (×5): 10 mg via ORAL
  Filled 2011-08-15 (×5): qty 2

## 2011-08-15 MED ORDER — PROTAMINE SULFATE 10 MG/ML IV SOLN
INTRAVENOUS | Status: DC | PRN
  Administered 2011-08-15: 350 mg via INTRAVENOUS
  Administered 2011-08-15: 25 mg via INTRAVENOUS

## 2011-08-15 MED ORDER — MORPHINE SULFATE 2 MG/ML IJ SOLN
2.0000 mg | INTRAMUSCULAR | Status: DC | PRN
Start: 2011-08-15 — End: 2011-08-17
  Administered 2011-08-16: 2 mg via INTRAVENOUS
  Filled 2011-08-15: qty 1

## 2011-08-15 MED ORDER — CEFAZOLIN SODIUM 1-5 GM-% IV SOLN: INTRAVENOUS | Status: DC | PRN

## 2011-08-15 MED ORDER — METOPROLOL TARTRATE 12.5 MG HALF TABLET
12.5000 mg | ORAL_TABLET | Freq: Two times a day (BID) | ORAL | Status: DC
  Administered 2011-08-17: 12.5 mg via ORAL
  Filled 2011-08-15 (×5): qty 1

## 2011-08-15 MED ORDER — SODIUM BICARBONATE 8.4 % IV SOLN
50.0000 meq | Freq: Once | INTRAVENOUS | Status: AC
  Administered 2011-08-15: 50 meq via INTRAVENOUS

## 2011-08-15 MED ORDER — OXYCODONE HCL 5 MG PO TABS
5.0000 mg | ORAL_TABLET | ORAL | Status: DC | PRN
  Administered 2011-08-15: 5 mg via ORAL
  Administered 2011-08-16 (×5): 10 mg via ORAL
  Administered 2011-08-16: 5 mg via ORAL
  Administered 2011-08-17 – 2011-08-18 (×6): 10 mg via ORAL
  Administered 2011-08-19: 5 mg via ORAL
  Administered 2011-08-19 – 2011-08-21 (×3): 10 mg via ORAL
  Administered 2011-08-22: 5 mg via ORAL
  Administered 2011-08-23: 10 mg via ORAL
  Administered 2011-08-25 – 2011-08-26 (×3): 5 mg via ORAL
  Administered 2011-08-27: 10 mg via ORAL
  Administered 2011-08-27 (×2): 5 mg via ORAL
  Filled 2011-08-15 (×6): qty 2
  Filled 2011-08-15: qty 1
  Filled 2011-08-15: qty 2
  Filled 2011-08-15 (×3): qty 1
  Filled 2011-08-15 (×2): qty 2
  Filled 2011-08-15 (×2): qty 1
  Filled 2011-08-15 (×7): qty 2
  Filled 2011-08-15 (×2): qty 1
  Filled 2011-08-15: qty 2

## 2011-08-15 MED ORDER — KETOROLAC TROMETHAMINE 15 MG/ML IJ SOLN
30.0000 mg | Freq: Four times a day (QID) | INTRAMUSCULAR | Status: DC
  Administered 2011-08-15 – 2011-08-16 (×3): 30 mg via INTRAVENOUS
  Filled 2011-08-15 (×6): qty 2
  Filled 2011-08-15 (×2): qty 1

## 2011-08-15 MED ORDER — PANTOPRAZOLE SODIUM 40 MG PO TBEC
40.0000 mg | DELAYED_RELEASE_TABLET | Freq: Every day | ORAL | Status: DC
  Administered 2011-08-16 – 2011-08-24 (×9): 40 mg via ORAL
  Filled 2011-08-15 (×9): qty 1

## 2011-08-15 MED ORDER — VENLAFAXINE HCL ER 150 MG PO CP24
150.0000 mg | ORAL_CAPSULE | Freq: Two times a day (BID) | ORAL | Status: DC
  Administered 2011-08-15 – 2011-08-28 (×26): 150 mg via ORAL
  Filled 2011-08-15 (×27): qty 1

## 2011-08-15 MED ORDER — LACTATED RINGERS IV SOLN
INTRAVENOUS | Status: DC | PRN
  Administered 2011-08-15 (×2): via INTRAVENOUS

## 2011-08-15 MED ORDER — ALBUMIN HUMAN 5 % IV SOLN
INTRAVENOUS | Status: DC | PRN
  Administered 2011-08-15 (×3): via INTRAVENOUS

## 2011-08-15 MED ORDER — POTASSIUM CHLORIDE 10 MEQ/50ML IV SOLN
10.0000 meq | INTRAVENOUS | Status: AC
  Administered 2011-08-15 (×3): 10 meq via INTRAVENOUS

## 2011-08-15 MED ORDER — HETASTARCH-ELECTROLYTES 6 % IV SOLN
INTRAVENOUS | Status: DC | PRN
  Administered 2011-08-15: 13:00:00 via INTRAVENOUS

## 2011-08-15 MED ORDER — HEMOSTATIC AGENTS (NO CHARGE) OPTIME
TOPICAL | Status: DC | PRN
  Administered 2011-08-15: 1 via TOPICAL

## 2011-08-15 MED ORDER — METOPROLOL TARTRATE 1 MG/ML IV SOLN
2.5000 mg | INTRAVENOUS | Status: DC | PRN
  Filled 2011-08-15: qty 5

## 2011-08-15 MED ORDER — VANCOMYCIN HCL IN DEXTROSE 1-5 GM/200ML-% IV SOLN
1000.0000 mg | Freq: Once | INTRAVENOUS | Status: AC
  Administered 2011-08-15: 1000 mg via INTRAVENOUS
  Filled 2011-08-15: qty 200

## 2011-08-15 MED ORDER — PROPOFOL 10 MG/ML IV EMUL
INTRAVENOUS | Status: DC | PRN
  Administered 2011-08-15: 70 mg via INTRAVENOUS

## 2011-08-15 MED ORDER — ALPRAZOLAM 0.5 MG PO TABS
1.0000 mg | ORAL_TABLET | Freq: Three times a day (TID) | ORAL | Status: DC
  Administered 2011-08-15 – 2011-08-19 (×13): 1 mg via ORAL
  Filled 2011-08-15: qty 1
  Filled 2011-08-15 (×5): qty 2
  Filled 2011-08-15: qty 1
  Filled 2011-08-15 (×2): qty 2
  Filled 2011-08-15: qty 1
  Filled 2011-08-15: qty 2
  Filled 2011-08-15: qty 1
  Filled 2011-08-15: qty 2
  Filled 2011-08-15: qty 1
  Filled 2011-08-15 (×2): qty 2

## 2011-08-15 MED ORDER — SODIUM CHLORIDE 0.9 % IV SOLN
INTRAVENOUS | Status: AC
  Administered 2011-08-15: 1.2 [IU]/h
  Filled 2011-08-15: qty 1

## 2011-08-15 MED ORDER — SODIUM CHLORIDE 0.9 % IV SOLN: INTRAVENOUS | Status: DC

## 2011-08-15 MED ORDER — MORPHINE SULFATE 2 MG/ML IJ SOLN: 1.0000 mg | INTRAMUSCULAR | Status: AC | PRN

## 2011-08-15 MED ORDER — SODIUM CHLORIDE 0.9 % IV SOLN
200.0000 ug | INTRAVENOUS | Status: DC | PRN
  Administered 2011-08-15: 0.2 ug/kg/h via INTRAVENOUS

## 2011-08-15 MED ORDER — DEXTROSE 5 % IV SOLN
1.5000 g | INTRAVENOUS | Status: DC | PRN
  Administered 2011-08-15: 1.5 g via INTRAVENOUS

## 2011-08-15 MED ORDER — ONDANSETRON HCL 4 MG/2ML IJ SOLN
4.0000 mg | Freq: Four times a day (QID) | INTRAMUSCULAR | Status: DC | PRN
  Administered 2011-08-15: 4 mg via INTRAVENOUS
  Filled 2011-08-15 (×2): qty 2

## 2011-08-15 MED ORDER — LACTATED RINGERS IV SOLN
INTRAVENOUS | Status: DC | PRN
  Administered 2011-08-15 (×2): via INTRAVENOUS

## 2011-08-15 MED ORDER — METOPROLOL TARTRATE 25 MG/10 ML ORAL SUSPENSION
12.5000 mg | Freq: Two times a day (BID) | ORAL | Status: DC
  Filled 2011-08-15 (×5): qty 5

## 2011-08-15 MED ORDER — NITROGLYCERIN IN D5W 200-5 MCG/ML-% IV SOLN: 0.0000 ug/min | INTRAVENOUS | Status: DC

## 2011-08-15 MED ORDER — FAMOTIDINE IN NACL 20-0.9 MG/50ML-% IV SOLN
20.0000 mg | Freq: Two times a day (BID) | INTRAVENOUS | Status: AC
  Administered 2011-08-15: 20 mg via INTRAVENOUS

## 2011-08-15 MED ORDER — POTASSIUM CHLORIDE 10 MEQ/50ML IV SOLN
10.0000 meq | INTRAVENOUS | Status: AC
  Administered 2011-08-15 (×3): 10 meq via INTRAVENOUS
  Filled 2011-08-15 (×3): qty 50

## 2011-08-15 MED ORDER — LACTATED RINGERS IV SOLN
INTRAVENOUS | Status: DC | PRN
  Administered 2011-08-15 (×3): via INTRAVENOUS

## 2011-08-15 MED ORDER — SODIUM CHLORIDE 0.9 % IJ SOLN
3.0000 mL | Freq: Two times a day (BID) | INTRAMUSCULAR | Status: DC
  Administered 2011-08-16 – 2011-08-17 (×3): 3 mL via INTRAVENOUS

## 2011-08-15 MED ORDER — LACTATED RINGERS IV SOLN
INTRAVENOUS | Status: DC
  Administered 2011-08-15: 20 mL/h via INTRAVENOUS

## 2011-08-15 MED ORDER — SODIUM CHLORIDE 0.9 % IV SOLN
100.0000 [IU] | INTRAVENOUS | Status: DC | PRN
  Administered 2011-08-15: .9 [IU]/h via INTRAVENOUS

## 2011-08-15 MED ORDER — SODIUM CHLORIDE 0.9 % IJ SOLN: 3.0000 mL | INTRAMUSCULAR | Status: DC | PRN

## 2011-08-15 MED ORDER — HEPARIN SODIUM (PORCINE) 1000 UNIT/ML IJ SOLN
INTRAMUSCULAR | Status: DC | PRN
  Administered 2011-08-15: 39000 [IU] via INTRAVENOUS

## 2011-08-15 MED ORDER — MIDAZOLAM HCL 5 MG/5ML IJ SOLN
INTRAMUSCULAR | Status: DC | PRN
  Administered 2011-08-15: 3 mg via INTRAVENOUS
  Administered 2011-08-15: 1 mg via INTRAVENOUS
  Administered 2011-08-15 (×2): 3 mg via INTRAVENOUS
  Administered 2011-08-15: 2 mg via INTRAVENOUS
  Administered 2011-08-15: 3 mg via INTRAVENOUS
  Administered 2011-08-15: 5 mg via INTRAVENOUS

## 2011-08-15 MED ORDER — ACETAMINOPHEN 160 MG/5ML PO SOLN
975.0000 mg | Freq: Four times a day (QID) | ORAL | Status: DC
  Filled 2011-08-15: qty 40.6

## 2011-08-15 MED ORDER — ACETAMINOPHEN 160 MG/5ML PO SOLN
650.0000 mg | ORAL | Status: AC
  Administered 2011-08-15: 650 mg

## 2011-08-15 MED ORDER — SODIUM CHLORIDE 0.45 % IV SOLN
INTRAVENOUS | Status: DC
  Administered 2011-08-15: 20 mL/h via INTRAVENOUS

## 2011-08-15 MED ORDER — 0.9 % SODIUM CHLORIDE (POUR BTL) OPTIME
TOPICAL | Status: DC | PRN
  Administered 2011-08-15: 6000 mL

## 2011-08-15 MED ORDER — DOCUSATE SODIUM 100 MG PO CAPS
200.0000 mg | ORAL_CAPSULE | Freq: Every day | ORAL | Status: DC
  Administered 2011-08-16 – 2011-08-21 (×6): 200 mg via ORAL
  Filled 2011-08-15 (×6): qty 2
  Filled 2011-08-15: qty 1
  Filled 2011-08-15 (×2): qty 2

## 2011-08-15 MED ORDER — PHENYLEPHRINE HCL 10 MG/ML IJ SOLN
20.0000 mg | INTRAVENOUS | Status: DC | PRN
  Administered 2011-08-15: 25 ug/min via INTRAVENOUS

## 2011-08-15 MED ORDER — SODIUM CHLORIDE 0.9 % IV SOLN
INTRAVENOUS | Status: DC | PRN
  Administered 2011-08-15: 12:00:00 via INTRAVENOUS

## 2011-08-15 MED ORDER — ALBUMIN HUMAN 5 % IV SOLN
250.0000 mL | INTRAVENOUS | Status: AC | PRN
  Administered 2011-08-15 (×4): 250 mL via INTRAVENOUS
  Filled 2011-08-15: qty 500

## 2011-08-15 MED ORDER — LACTATED RINGERS IV SOLN: 500.0000 mL | Freq: Once | INTRAVENOUS | Status: AC | PRN

## 2011-08-15 MED ORDER — SIMVASTATIN 40 MG PO TABS
40.0000 mg | ORAL_TABLET | Freq: Every day | ORAL | Status: DC
  Administered 2011-08-15 – 2011-08-19 (×5): 40 mg via ORAL
  Filled 2011-08-15 (×6): qty 1

## 2011-08-15 MED ORDER — VECURONIUM BROMIDE 10 MG IV SOLR
INTRAVENOUS | Status: DC | PRN
  Administered 2011-08-15: 3 mg via INTRAVENOUS
  Administered 2011-08-15: 2 mg via INTRAVENOUS
  Administered 2011-08-15: 10 mg via INTRAVENOUS
  Administered 2011-08-15: 5 mg via INTRAVENOUS

## 2011-08-15 MED ORDER — PHENYLEPHRINE HCL 10 MG/ML IJ SOLN
0.0000 ug/min | INTRAVENOUS | Status: DC
  Administered 2011-08-16: 55 ug/min via INTRAVENOUS
  Filled 2011-08-15 (×6): qty 2

## 2011-08-15 MED ORDER — ASPIRIN 81 MG PO CHEW: 324.0000 mg | CHEWABLE_TABLET | Freq: Every day | ORAL | Status: DC

## 2011-08-15 MED ORDER — MIDAZOLAM HCL 2 MG/2ML IJ SOLN: 2.0000 mg | INTRAMUSCULAR | Status: DC | PRN

## 2011-08-15 MED ORDER — CALCIUM CHLORIDE 10 % IV SOLN
1.0000 g | Freq: Once | INTRAVENOUS | Status: AC
  Administered 2011-08-15: 1 g via INTRAVENOUS

## 2011-08-15 MED ORDER — SODIUM CHLORIDE 0.9 % IJ SOLN
OROMUCOSAL | Status: DC | PRN
  Administered 2011-08-15 (×3): via TOPICAL

## 2011-08-15 MED ORDER — NITROGLYCERIN IN D5W 200-5 MCG/ML-% IV SOLN
INTRAVENOUS | Status: DC | PRN
  Administered 2011-08-15: 5 ug/kg/min via INTRAVENOUS

## 2011-08-15 MED ORDER — INSULIN REGULAR BOLUS VIA INFUSION
0.0000 [IU] | Freq: Three times a day (TID) | INTRAVENOUS | Status: AC
  Administered 2011-08-16: 0.8 [IU] via INTRAVENOUS
  Filled 2011-08-15: qty 10

## 2011-08-15 SURGICAL SUPPLY — 87 items
ADAPTER CARDIO PERF ANTE/RETRO (ADAPTER) ×2 IMPLANT
ADH SRG 12 PREFL SYR 3 SPRDR (MISCELLANEOUS)
ADPR PRFSN 84XANTGRD RTRGD (ADAPTER) ×1
APPLICATOR COTTON TIP 6IN STRL (MISCELLANEOUS) ×2 IMPLANT
ATTRACTOMAT 16X20 MAGNETIC DRP (DRAPES) ×2 IMPLANT
BAG DECANTER FOR FLEXI CONT (MISCELLANEOUS) ×2 IMPLANT
BANDAGE HEMOSTAT MRDH 4X4 STRL (MISCELLANEOUS) ×1 IMPLANT
BLADE STERNUM SYSTEM 6 (BLADE) ×2 IMPLANT
BLADE SURG 15 STRL LF DISP TIS (BLADE) ×2 IMPLANT
BLADE SURG 15 STRL SS (BLADE) ×4
BNDG HEMOSTAT MRDH 4X4 STRL (MISCELLANEOUS) ×2
CANISTER SUCTION 2500CC (MISCELLANEOUS) ×2 IMPLANT
CANNULA GUNDRY RCSP 15FR (MISCELLANEOUS) ×2 IMPLANT
CATH CPB KIT HENDRICKSON (MISCELLANEOUS) ×2 IMPLANT
CATH HEART VENT LEFT (CATHETERS) ×1 IMPLANT
CATH ROBINSON RED A/P 18FR (CATHETERS) ×4 IMPLANT
CATH THORACIC 36FR RT ANG (CATHETERS) ×4 IMPLANT
CLIP FOGARTY SPRING 6M (CLIP) IMPLANT
CLOTH BEACON ORANGE TIMEOUT ST (SAFETY) ×2 IMPLANT
CONT SPEC 4OZ CLIKSEAL STRL BL (MISCELLANEOUS) ×2 IMPLANT
COVER SURGICAL LIGHT HANDLE (MISCELLANEOUS) ×4 IMPLANT
CRADLE DONUT ADULT HEAD (MISCELLANEOUS) ×2 IMPLANT
DRAIN CHANNEL 28F RND 3/8 FF (WOUND CARE) ×2 IMPLANT
DRAPE C-ARM 42X72 X-RAY (DRAPES) ×2 IMPLANT
DRAPE SLUSH MACHINE 52X66 (DRAPES) IMPLANT
DRAPE SLUSH/WARMER DISC (DRAPES) IMPLANT
DRSG COVADERM 4X14 (GAUZE/BANDAGES/DRESSINGS) ×2 IMPLANT
ELECT REM PT RETURN 9FT ADLT (ELECTROSURGICAL) ×4
ELECTRODE REM PT RTRN 9FT ADLT (ELECTROSURGICAL) ×2 IMPLANT
GLOVE BIO SURGEON STRL SZ 6 (GLOVE) ×4 IMPLANT
GLOVE BIOGEL PI IND STRL 6 (GLOVE) ×3 IMPLANT
GLOVE BIOGEL PI IND STRL 6.5 (GLOVE) ×1 IMPLANT
GLOVE BIOGEL PI IND STRL 7.0 (GLOVE) ×4 IMPLANT
GLOVE BIOGEL PI INDICATOR 6 (GLOVE) ×3
GLOVE BIOGEL PI INDICATOR 6.5 (GLOVE) ×1
GLOVE BIOGEL PI INDICATOR 7.0 (GLOVE) ×4
GLOVE EUDERMIC 7 POWDERFREE (GLOVE) ×6 IMPLANT
GLOVE SURG SS PI 6.0 STRL IVOR (GLOVE) ×6 IMPLANT
GOWN PREVENTION PLUS XLARGE (GOWN DISPOSABLE) ×8 IMPLANT
GOWN STRL NON-REIN LRG LVL3 (GOWN DISPOSABLE) ×8 IMPLANT
HEMOSTAT POWDER SURGIFOAM 1G (HEMOSTASIS) ×6 IMPLANT
HEMOSTAT SURGICEL 2X14 (HEMOSTASIS) ×2 IMPLANT
INSERT FOGARTY XLG (MISCELLANEOUS) IMPLANT
KIT BASIN OR (CUSTOM PROCEDURE TRAY) ×2 IMPLANT
KIT ROOM TURNOVER OR (KITS) ×2 IMPLANT
KIT SUCTION CATH 14FR (SUCTIONS) ×4 IMPLANT
LINE VENT (MISCELLANEOUS) ×2 IMPLANT
NS IRRIG 1000ML POUR BTL (IV SOLUTION) ×12 IMPLANT
PACK OPEN HEART (CUSTOM PROCEDURE TRAY) ×2 IMPLANT
PAD ARMBOARD 7.5X6 YLW CONV (MISCELLANEOUS) ×4 IMPLANT
RUBBERBAND STERILE (MISCELLANEOUS) ×2 IMPLANT
SET CARDIOPLEGIA MPS 5001102 (MISCELLANEOUS) ×2 IMPLANT
SPONGE GAUZE 4X4 12PLY (GAUZE/BANDAGES/DRESSINGS) ×2 IMPLANT
SUT BONE WAX W31G (SUTURE) ×2 IMPLANT
SUT ETHIBON 2 0 V 52N 30 (SUTURE) ×4 IMPLANT
SUT ETHIBON EXCEL 2-0 V-5 (SUTURE) IMPLANT
SUT ETHIBOND 2 0 SH (SUTURE) ×6
SUT ETHIBOND 2 0 SH 36X2 (SUTURE) ×3 IMPLANT
SUT ETHIBOND 2 0 V4 (SUTURE) IMPLANT
SUT ETHIBOND 2 0V4 GREEN (SUTURE) IMPLANT
SUT ETHIBOND 4 0 RB 1 (SUTURE) IMPLANT
SUT ETHIBOND V-5 VALVE (SUTURE) IMPLANT
SUT PROLENE 3 0 SH 1 (SUTURE) IMPLANT
SUT PROLENE 3 0 SH DA (SUTURE) ×4 IMPLANT
SUT PROLENE 4 0 RB 1 (SUTURE) ×18
SUT PROLENE 4 0 SH DA (SUTURE) ×18 IMPLANT
SUT PROLENE 4-0 RB1 .5 CRCL 36 (SUTURE) ×9 IMPLANT
SUT SILK  1 MH (SUTURE) ×2
SUT SILK 1 MH (SUTURE) ×2 IMPLANT
SUT STEEL 6MS V (SUTURE) ×2 IMPLANT
SUT STEEL SZ 6 DBL 3X14 BALL (SUTURE) ×2 IMPLANT
SUT VIC AB 1 CTX 36 (SUTURE) ×4
SUT VIC AB 1 CTX36XBRD ANBCTR (SUTURE) ×2 IMPLANT
SUT VIC AB 2-0 CTX 27 (SUTURE) ×2 IMPLANT
SUT VIC AB 3-0 X1 27 (SUTURE) IMPLANT
SUTURE E-PAK OPEN HEART (SUTURE) ×2 IMPLANT
SYR 10ML KIT SKIN ADHESIVE (MISCELLANEOUS) IMPLANT
SYSTEM SAHARA CHEST DRAIN ATS (WOUND CARE) ×2 IMPLANT
TAPE CLOTH SURG 4X10 WHT LF (GAUZE/BANDAGES/DRESSINGS) ×2 IMPLANT
TOWEL OR 17X24 6PK STRL BLUE (TOWEL DISPOSABLE) ×2 IMPLANT
TOWEL OR 17X26 10 PK STRL BLUE (TOWEL DISPOSABLE) ×8 IMPLANT
TRAY FOLEY IC TEMP SENS 14FR (CATHETERS) ×2 IMPLANT
TUBE FEEDING 8FR 16IN STR KANG (MISCELLANEOUS) ×2 IMPLANT
UNDERPAD 30X30 INCONTINENT (UNDERPADS AND DIAPERS) ×2 IMPLANT
VALVE REGENT 25MM (Pacemaker) ×2 IMPLANT
VENT LEFT HEART 12002 (CATHETERS) ×2
WATER STERILE IRR 1000ML POUR (IV SOLUTION) ×4 IMPLANT

## 2011-08-15 NOTE — Anesthesia Preprocedure Evaluation (Addendum)
Anesthesia Evaluation  Patient identified by MRN, date of birth, ID band  Reviewed: Allergy & Precautions, H&P , NPO status , Patient's Chart, lab work & pertinent test results  Airway Mallampati: II TM Distance: >3 FB Neck ROM: Full    Dental  (+) Dental Advisory Given, Edentulous Upper and Edentulous Lower   Pulmonary shortness of breath, Current Smoker,  breath sounds clear to auscultation  Pulmonary exam normal       Cardiovascular - CAD and - Past MI + Valvular Problems/Murmurs AS Rhythm:Regular Rate:Normal + Systolic murmurs    Neuro/Psych Seizures -,  PSYCHIATRIC DISORDERS Anxiety Depression CVA    GI/Hepatic Neg liver ROS, GERD-  Medicated,  Endo/Other  negative endocrine ROS  Renal/GU negative Renal ROS     Musculoskeletal   Abdominal   Peds  Hematology   Anesthesia Other Findings   Reproductive/Obstetrics                         Anesthesia Physical Anesthesia Plan  ASA: IV  Anesthesia Plan: General   Post-op Pain Management:    Induction: Intravenous  Airway Management Planned: Oral ETT  Additional Equipment: PA Cath, TEE and Arterial line  Intra-op Plan:   Post-operative Plan: Post-operative intubation/ventilation  Informed Consent: I have reviewed the patients History and Physical, chart, labs and discussed the procedure including the risks, benefits and alternatives for the proposed anesthesia with the patient or authorized representative who has indicated his/her understanding and acceptance.   Dental advisory given  Plan Discussed with: CRNA, Anesthesiologist and Surgeon  Anesthesia Plan Comments:         Anesthesia Quick Evaluation

## 2011-08-15 NOTE — Progress Notes (Signed)
  Echocardiogram Echocardiogram Transesophageal has been performed.  Mercy Moore 08/15/2011, 9:42 AM

## 2011-08-15 NOTE — Transfer of Care (Signed)
Immediate Anesthesia Transfer of Care Note  Patient: [name]  Procedure(s) Performed: Procedure(s) (LRB): AORTIC VALVE REPLACEMENT (AVR) (N/A)  Patient Location: SICU  Anesthesia Type: General  Level of Consciousness: sedated  Airway & Oxygen Therapy: Patient remains intubated per anesthesia plan and Patient placed on Ventilator (see vital sign flow sheet for setting)  Post-op Assessment: Report given to PACU RN and Post -op Vital signs reviewed and stable  Post vital signs: Reviewed and stable  Complications: No apparent anesthesia complications

## 2011-08-15 NOTE — Progress Notes (Signed)
Status post aortic valve replacement with 25 mm St. Jude valve for aortic stenosis Extubated stable hemodynamics neuro intact Minimal chest tube drain Stable postop

## 2011-08-15 NOTE — Anesthesia Postprocedure Evaluation (Signed)
Anesthesia Post Note  Patient: [name]  Procedure(s) Performed: Procedure(s) (LRB): AORTIC VALVE REPLACEMENT (AVR) (N/A)  Anesthesia type: General  Patient location: ICU  Post pain: Pain level controlled  Post assessment: Post-op Vital signs reviewed  Last Vitals:  Filed Vitals:   08/15/11 1500  BP:   Pulse: 79  Temp: 35.8 C  Resp: 12    Post vital signs: stable  Level of consciousness: Patient remains intubated per anesthesia plan  Complications: No apparent anesthesia complications

## 2011-08-15 NOTE — Brief Op Note (Addendum)
08/15/2011  11:15 AM  PATIENT:  [name]  61 y.o. male  PRE-OPERATIVE DIAGNOSIS:  AORTIC STENOSIS  POST-OPERATIVE DIAGNOSIS:  AORTIC STENOSIS  PROCEDURE:  Procedure(s): AORTIC VALVE REPLACEMENT (AVR) with 25 mm St. Jude mechanical valve  SURGEON:  Surgeon(s): Loreli Slot, MD  ASSISTANT: Coral Ceo, PA-C  ANESTHESIA:   general  PATIENT CONDITION:  ICU - intubated and hemodynamically stable.  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

## 2011-08-15 NOTE — Interval H&P Note (Signed)
History and Physical Interval Note:  08/15/2011 7:37 AM  [name]  has presented today for surgery, with the diagnosis of AORTIC STENOSIS  The various methods of treatment have been discussed with the patient and family. After consideration of risks, benefits and other options for treatment, the patient has consented to  Procedure(s) (LRB): AORTIC VALVE REPLACEMENT (AVR) (N/A) as a surgical intervention .  The patients' history has been reviewed, patient examined, no change in status, stable for surgery.  I have reviewed the patients' chart and labs.  Questions were answered to the patient's satisfaction.     Maximina Pirozzi C

## 2011-08-15 NOTE — OR Nursing (Signed)
Made call to volunteer desk to inform family patient is off pump at 1209. Made first call to 2300 SICU at 1213. Made second call to 2300 SICU at 1236.

## 2011-08-15 NOTE — Preoperative (Signed)
Beta Blockers   Reason not to administer Beta Blockers:Not Applicable 

## 2011-08-15 NOTE — Progress Notes (Signed)
fio2 increased to 100% due to low sp02, BBS equal and clear, MD at the bedside.

## 2011-08-15 NOTE — H&P (Signed)
PCP is Donzetta Sprung, MD, MD Referring Provider is de Melanie Crazier, MD    Chief Complaint   Patient presents with   .  Aortic Stenosis       eval and treat...echo 07/10/11...cathed 07/31/11        HPI: [name] is a 61 year old gentleman with a known bicuspid aortic valve who presents with chief complaint of chest pain. Mr. Dewoody has had a heart murmur since childhood and has been diagnosed with a bicuspid aortic valve. He has been followed with serial echocardiograms over the past several years. He recently been having problems with chest pain which she describes as a tightness or pressure with exertion, this is associated with shortness of breath. He would get this with walking up one flight of stairs or less of a quarter mile on level ground. He also is been having complaints of dizziness although no frank syncope. He also complains of shortness of breath with exertion as well as orthopnea. Recent echocardiogram showed progression of his aortic stenosis with an mean gradient of 40 mm of mercury and a valve area of 0.7 cm. He underwent cardiac catheterization on 07/31/2011 which showed some atherosclerotic plaque in the circumflex and right coronary, but no hemodynamically significant lesions. The aortic valve could not be crossed at the time of catheterization.    Past Medical History   Diagnosis  Date   .  Murmur         Trace mitral regurgitation by echocardiogram February 2012   .  Hyperlipidemia         mixed   .  Mitral regurgitation         New murmur at the apex consistent with mitral regurgitation   .  Aortic stenosis         Bicuspid aortic valve with mild-to-moderate aortic stenosis echocardiogram February 2012 mean gradient 34 mmHg, aortic valve area 0.7 cm, peak velocity 2.43 m/scomment by catheterization mean gradient 18 mmHg December 2010 status post TEE   .  CAD (coronary artery disease)         native vessel, nonobstructive, by catheterization, last catheterization  December 2010   .  Dyslipidemia     .  Major depressive disorder         wih prior suicidal attempts   .  Myocardial infarction  10 yrs ago   .  Stroke         12 yrs ago   .  Shortness of breath     .  Seizures         6 yrs ago from withdrawal of xanax to quickly.   .  Anxiety     .  Fibromyalgia           Past Surgical History   Procedure  Date   .  Inguinal exploration         right   .  Femoral hernia repair         x2   .  Umbilical hernia repair         [facility]   .  Finger contracture release         Tendon release operation on his right little finger   .  Appendectomy     .  Tonsillectomy     .  Cardiac catheterization     .  Cholecystectomy         [facility]   .  Incisional hernia repair  05/01/2011  Procedure: HERNIA REPAIR INCISIONAL;  Surgeon: Dalia Heading, MD;  Location: [facility];  Service: General;  Laterality: N/A;  with Mesh         Family History   Problem  Relation  Age of Onset   .  Cancer       .  Stroke       .  Anesthesia problems  Neg Hx     .  Hypotension  Neg Hx     .  Malignant hyperthermia  Neg Hx     .  Pseudochol deficiency  Neg Hx          Social History History   Substance Use Topics   .  Smoking status:  Current Everyday Smoker -- 0.5 packs/day for 48 years       Types:  Cigarettes   .  Smokeless tobacco:  Never Used   .  Alcohol Use:  No         Current Outpatient Prescriptions   Medication  Sig  Dispense  Refill   .  acetaminophen (TYLENOL) 500 MG tablet  Take 500 mg by mouth every 6 (six) hours as needed. For pain          .  ALPRAZolam (XANAX) 1 MG tablet  Take 1 mg by mouth 5 (five) times daily.           [name]  aspirin EC 81 MG tablet  Take 81 mg by mouth daily.         [name]  HYDROcodone-acetaminophen (NORCO) 5-325 MG per tablet  Take 1 tablet by mouth every 8 (eight) hours as needed. For back and leg pain...arthritis         .  nitroGLYCERIN (NITROSTAT) 0.4 MG SL tablet  Place 0.4 mg under the tongue every 5 (five) minutes  as needed. For chest pains         .  pantoprazole (PROTONIX) 40 MG tablet  Take 40 mg by mouth daily.           .  polyethylene glycol powder (MIRALAX) powder  Take 17 g by mouth daily.           .  promethazine (PHENERGAN) 25 MG tablet  Take 1 tablet by mouth 4 times daily as needed.         .  simvastatin (ZOCOR) 40 MG tablet  Take 40 mg by mouth at bedtime.           [name]  venlafaxine (EFFEXOR-XR) 150 MG 24 hr capsule  Take 150 mg by mouth 2 (two) times daily.          .  mupirocin ointment (BACTROBAN) 2 %                No Known Allergies   Review of Systems  Constitutional: Positive for appetite change and unexpected weight change (wt gain).  HENT:        Full dentures  Eyes: Positive for visual disturbance.  Cardiovascular: Positive for chest pain and leg swelling. Negative for palpitations.        Claudication, heart murmur, orthopnea, SOB with exertion  Gastrointestinal:        Abdominal pain, constipation  Genitourinary: Positive for dysuria and frequency.  Musculoskeletal: Positive for arthralgias.  Neurological: Positive for dizziness and seizures (2008).        Ministroke 10 yrs ago, double vision  Psychiatric/Behavioral: Positive for dysphoric mood.  All other systems reviewed and are negative.  BP 122/86  Pulse 84  Resp 16  Ht 6' (1.829 m)  Wt 205 lb (92.987 kg)  BMI 27.80 kg/m2  SpO2 98% Physical Exam  Constitutional: He is oriented to person, place, and time. He appears well-developed and well-nourished. No distress.  HENT:   Head: Normocephalic and atraumatic.  Eyes: EOM are normal. Pupils are equal, round, and reactive to light.  Neck: Neck supple. No JVD present. No thyromegaly present.       Bruits versus transmitted murmur bilaterally  Cardiovascular: Normal rate and regular rhythm.    Murmur (3/6 systolic murmur) heard. Pulmonary/Chest: Effort normal. No respiratory distress. He has no wheezes. He has no rales.  Abdominal: Soft. There is no  tenderness.  Musculoskeletal: Normal range of motion. He exhibits no edema.  Lymphadenopathy:    He has no cervical adenopathy.  Neurological: He is alert and oriented to person, place, and time. No cranial nerve deficit.  Skin: Skin is warm and dry.        Diagnostic Tests: Echocardiogram report and cardiac catheterization films reviewed findings as previously noted   Impression: 61 year old gentleman with severe aortic stenosis which is very symptomatic with chest pain, shortness of breath, and dizziness. Aortic valve replacement is indicated for survival benefit as well as relief of symptoms.   I discussed with him the options for aortic valve replacement including mechanical and tissue valves. He understands the relative advantages and disadvantages of each as it relates to need for lifelong anticoagulation and valve life span. He has a strong preference for a mechanical valve. He does understand that he would be lifelong anticoagulation with Coumadin and that he would have to watch his diet and have his blood test on a regular basis. He has a cousin who had a mechanical valve replacement 7 years ago and has been doing well and, therefore, Mr. Trauger is familiar with the demands associated with Coumadin.   I discussed with him surgical approaches including a traditional median sternotomy or a parasternal approach. He prefers median sternotomy given that it is the more standard approach.   We discussed the natural history of aortic stenosis and the indications for replacement. We discussed the risks and benefits as well as alternative treatments. He understands that the risks of surgery include but are not limited to death, MI, stroke, DVT, PE, bleeding, possible need for transfusion, infection, heart block requiring permanent pacemaker, arrhythmias, and other organ system dysfunction including respiratory, renal, or GI complications. He understands and accepts these risks and agrees to  proceed.   Plan: Mechanical aortic valve replacement on Thursday, 08/15/2011.     08/15/11 No interval change

## 2011-08-15 NOTE — Procedures (Signed)
Extubation Procedure Note  Patient Details:   Name: [name] DOB: 1950-11-02 MRN: [id number]   Airway Documentation:     Evaluation  O2 sats: stable throughout Complications: No apparent complications Patient did tolerate procedure well. Bilateral Breath Sounds: Clear;Diminished   Yes  Newt Lukes 08/15/2011, 5:58 PM

## 2011-08-15 NOTE — Progress Notes (Signed)
Nif and FVC WNL

## 2011-08-16 ENCOUNTER — Inpatient Hospital Stay (HOSPITAL_COMMUNITY): Payer: PRIVATE HEALTH INSURANCE

## 2011-08-16 ENCOUNTER — Encounter (HOSPITAL_COMMUNITY): Payer: Self-pay | Admitting: Thoracic Surgery (Cardiothoracic Vascular Surgery)

## 2011-08-16 LAB — CBC
HCT: 25.1 % — ABNORMAL LOW (ref 39.0–52.0)
Hemoglobin: 8.9 g/dL — ABNORMAL LOW (ref 13.0–17.0)
MCH: 31.9 pg (ref 26.0–34.0)
MCHC: 33.3 g/dL (ref 30.0–36.0)
MCHC: 35.5 g/dL (ref 30.0–36.0)
MCV: 90 fL (ref 78.0–100.0)
RDW: 14.4 % (ref 11.5–15.5)
WBC: 9.2 10*3/uL (ref 4.0–10.5)

## 2011-08-16 LAB — GLUCOSE, CAPILLARY
Glucose-Capillary: 131 mg/dL — ABNORMAL HIGH (ref 70–99)
Glucose-Capillary: 126 mg/dL — ABNORMAL HIGH (ref 70–99)
Glucose-Capillary: 151 mg/dL — ABNORMAL HIGH (ref 70–99)
Glucose-Capillary: 144 mg/dL — ABNORMAL HIGH (ref 70–99)
Glucose-Capillary: 142 mg/dL — ABNORMAL HIGH (ref 70–99)
Glucose-Capillary: 90 mg/dL (ref 70–99)
Glucose-Capillary: 101 mg/dL — ABNORMAL HIGH (ref 70–99)
Glucose-Capillary: 102 mg/dL — ABNORMAL HIGH (ref 70–99)
Glucose-Capillary: 104 mg/dL — ABNORMAL HIGH (ref 70–99)
Glucose-Capillary: 109 mg/dL — ABNORMAL HIGH (ref 70–99)
Glucose-Capillary: 103 mg/dL — ABNORMAL HIGH (ref 70–99)
Glucose-Capillary: 112 mg/dL — ABNORMAL HIGH (ref 70–99)
Glucose-Capillary: 104 mg/dL — ABNORMAL HIGH (ref 70–99)

## 2011-08-16 LAB — POCT I-STAT, CHEM 8
Chloride: 104 mEq/L (ref 96–112)
HCT: 32 % — ABNORMAL LOW (ref 39.0–52.0)
Hemoglobin: 10.9 g/dL — ABNORMAL LOW (ref 13.0–17.0)
Potassium: 3.9 mEq/L (ref 3.5–5.1)
Sodium: 139 mEq/L (ref 135–145)

## 2011-08-16 LAB — BASIC METABOLIC PANEL
GFR calc Af Amer: >90 mL/min (ref >90)
GFR calc non Af Amer: 90 mL/min — ABNORMAL LOW (ref >90)
Potassium: 3.8 mEq/L (ref 3.5–5.1)
Sodium: 139 mEq/L (ref 135–145)

## 2011-08-16 LAB — PREPARE RBC (CROSSMATCH)

## 2011-08-16 LAB — MAGNESIUM: Magnesium: 2.3 mg/dL (ref 1.5–2.5)

## 2011-08-16 MED ORDER — ALBUMIN HUMAN 5 % IV SOLN
12.5000 g | Freq: Once | INTRAVENOUS | Status: DC
Start: 2011-08-16 — End: 2011-08-17

## 2011-08-16 MED ORDER — FUROSEMIDE 10 MG/ML IJ SOLN
40.0000 mg | Freq: Once | INTRAMUSCULAR | Status: AC
  Administered 2011-08-16: 40 mg via INTRAVENOUS
  Filled 2011-08-16: qty 4

## 2011-08-16 MED ORDER — KETOROLAC TROMETHAMINE 30 MG/ML IJ SOLN
30.0000 mg | Freq: Four times a day (QID) | INTRAMUSCULAR | Status: AC
  Administered 2011-08-16 – 2011-08-17 (×5): 30 mg via INTRAVENOUS
  Filled 2011-08-16 (×2): qty 1

## 2011-08-16 MED ORDER — WARFARIN SODIUM 5 MG PO TABS
5.0000 mg | ORAL_TABLET | Freq: Once | ORAL | Status: AC
  Administered 2011-08-16: 5 mg via ORAL
  Filled 2011-08-16: qty 1

## 2011-08-16 MED ORDER — INSULIN ASPART 100 UNIT/ML ~~LOC~~ SOLN: 0.0000 [IU] | SUBCUTANEOUS | Status: DC

## 2011-08-16 MED ORDER — ALBUMIN HUMAN 5 % IV SOLN
12.5000 g | Freq: Once | INTRAVENOUS | Status: AC
  Administered 2011-08-16: 12.5 g via INTRAVENOUS

## 2011-08-16 MED ORDER — FUROSEMIDE 10 MG/ML IJ SOLN
40.0000 mg | Freq: Once | INTRAMUSCULAR | Status: AC
Start: 2011-08-16 — End: 2011-08-16
  Administered 2011-08-16: 40 mg via INTRAVENOUS
  Filled 2011-08-16: qty 4

## 2011-08-16 MED ORDER — INSULIN GLARGINE 100 UNIT/ML ~~LOC~~ SOLN
30.0000 [IU] | SUBCUTANEOUS | Status: DC
  Administered 2011-08-16 – 2011-08-18 (×3): 30 [IU] via SUBCUTANEOUS

## 2011-08-16 MED ORDER — CALCIUM CHLORIDE 10 % IV SOLN
1.0000 g | Freq: Once | INTRAVENOUS | Status: AC
  Administered 2011-08-16: 1 g via INTRAVENOUS
  Filled 2011-08-16: qty 10

## 2011-08-16 MED ORDER — WARFARIN - PHYSICIAN DOSING INPATIENT
Freq: Every day | Status: DC
  Administered 2011-08-16 – 2011-08-22 (×2)

## 2011-08-16 MED ORDER — POTASSIUM CHLORIDE 10 MEQ/50ML IV SOLN
10.0000 meq | INTRAVENOUS | Status: AC
  Administered 2011-08-16 (×4): 10 meq via INTRAVENOUS
  Filled 2011-08-16: qty 50

## 2011-08-16 NOTE — Progress Notes (Signed)
Dr. Dorris Fetch paged concerning decreased 02 sats, MD to bedside, orders received.

## 2011-08-16 NOTE — Progress Notes (Signed)
Nursing daily progress:  Throughout the late afternoon [name] has shown improvement in both his respiratory status and pain medication needs.  He has been weaned off his NEO and is now up to the chair and comfortable.   [name] has shown vast improvement today.

## 2011-08-16 NOTE — Progress Notes (Signed)
Patient up in chair-ambulated with nurse 100 ft.  When returned to bed, O2 sats dropped to 87% on 6L/M [place].  Switched to 50% Venturi mask.  Patient denies SOB-no WOB present.  Lungs essentially clear, decreased in bases.  O2 sats now 92-93%.  Continue to monitor.

## 2011-08-16 NOTE — Progress Notes (Signed)
Patient received fried chicken tenders, mashed potatoes and gravy, carrots a cafeinated beverage and ice cream for lunch.  Not an appropriate diet for cardiac patients. Diet changed to carb mod heart healthy diet 2gm low sodium as per "advance as tolerated" post op [facility] diet orders.

## 2011-08-16 NOTE — Progress Notes (Signed)
Clinical Social Work Department BRIEF PSYCHOSOCIAL ASSESSMENT 08/16/2011  Patient:  [name], [name]     Account Number:  [id number]     Admit date:  08/15/2011  Clinical Social Worker:  Mee Hives  Date/Time:  08/16/2011 10:00 AM  Referred by:  RN  Date Referred:  08/15/2011 Referred for  SNF Placement  Behavioral Health Issues   Other Referral:   Interview type:  Patient Other interview type:    PSYCHOSOCIAL DATA Living Status:  ALONE Admitted from facility:   Level of care:   Primary support name:  Benn Moulder Primary support relationship to patient:  FAMILY Degree of support available:   Moderate, pt lives alone but has caring friends/family who provide support    CURRENT CONCERNS Current Concerns  Post-Acute Placement  Other - See comment   Other Concerns:   Chronic severe depression & anxiety, managed through medication with some functional limitations    SOCIAL WORK ASSESSMENT / PLAN CSW spoke extensively with this pt regarding both disposition, support, and mental health concerns.    Pt endorses history of severe depression and anxiety, but feels this is fairly well managed with medication. Pt with one inpatient admission 10 years ago due to suicide attempt, has since received treatment at [facility] and states outpatient therapy was not helpful so he stopped attending. CSW provided brief counseling around anxiety and depression and offerred psychoeducation around therapeutic modalities that might be helpful in decreasing anxiety pt experiences.    Pt states his friend "Annette Stable" and family member Marchelle Folks are able to provide some assistance at time of d/c. CSW discussed sternal precautions and need for 24 hour care and pt is hopeful his friend will be able to provide this level of care. Pt will discuss this and CSW will follow up on Monday.    Pt agreeable to SNF as backup plan in the event care is not available from his friends. CSW will initiate SNF search at this  time.   Assessment/plan status:  Psychosocial Support/Ongoing Assessment of Needs Other assessment/ plan:   D/c to home or SNF, to be determined based on caregiver support.  Need PT eval, RNCM to order   Information/referral to community resources:   CSW will further discuss mental health needs and provide referrals, as appropriate    PATIENT'S/FAMILY'S RESPONSE TO PLAN OF CARE: Pt receptive, spoke openly and at length about his mental health history, life struggles, and his perspective.

## 2011-08-16 NOTE — Progress Notes (Signed)
cCRITICAL VALUE ALERT  Critical value received:  Hemoglobin 6.7  Date of notification:  08/16/11  Time of notification:  0445  Critical value read back:yes  Nurse who received alert:  Raphael Gibney  MD notified (1st page):  [phone]  Time of first page:  0523  MD notified (2nd page):  Time of second page:  Responding MD:  Maren Beach  Time MD responded:  [phone]

## 2011-08-16 NOTE — Clinical Documentation Improvement (Signed)
Anemia Blood Loss Clarification  THIS DOCUMENT IS NOT A PERMANENT PART OF THE MEDICAL RECORD  RESPOND TO THE THIS QUERY, FOLLOW THE INSTRUCTIONS BELOW:  1. If needed, update documentation for the patient's encounter via the notes activity.  2. Access this query again and click edit on the In [employer].  3. After updating, or not, click F2 to complete all highlighted (required) fields concerning your review. Select "additional documentation in the medical record" OR "no additional documentation provided".  4. Click Sign note button.  5. The deficiency will fall out of your In Basket *Please let us know if you are not able to complete this workflow by phone or e-mail (listed below).        08/16/11  Dear Dr.Hendrickson Marton Redwood  In an effort to better capture your patient's severity of illness, reflect appropriate length of stay and utilization of resources, a review of the patient medical record has revealed the following indicators.    Based on your clinical judgment, please clarify and document in a progress note and/or discharge summary the clinical condition associated with the following supporting information:  In responding to this query please exercise your independent judgment.  The fact that a query is asked, does not imply that any particular answer is desired or expected.  Possible Clinical Conditions?   " Expected Acute Blood Loss Anemia  " Acute Blood Loss Anemia  " Precipitous drop in Hematocrit  " Other Condition  " Cannot Clinically Determine    Supporting Information:  Signs and Symptoms:  Per Anesthesia record, EBL=1543ml.  Diagnostics: H&H on 5/16:  11.2/33.0 H&H on 5/17:    6.7/20.1  Transfusion: 5/17:   2 units prbc's  IV fluids / plasma expanders: 5/16:   Albumin 5%, x3 5/16:   Hextend 6%,  Reviewed: Additional documentation provided per 08/18/11 progress notes.  Thank You,  Marciano Sequin,  Clinical  Documentation Specialist:  Pager: [phone]  [facility] [place]

## 2011-08-16 NOTE — Progress Notes (Signed)
1 Day Post-Op Procedure(s) (LRB): AORTIC VALVE REPLACEMENT (AVR) (N/A) Subjective: comfortable  Objective: Vital signs in last 24 hours: Temp:  [94.1 F (34.5 C)-100.8 F (38.2 C)] 99.1 F (37.3 C) (05/17 0748) Pulse Rate:  [75-101] 80  (05/17 0700) Cardiac Rhythm:  [-] Normal sinus rhythm (05/17 0000) Resp:  [4-28] 13  (05/17 0700) BP: (89-112)/(51-66) 107/62 mmHg (05/17 0700) SpO2:  [86 %-100 %] 91 % (05/17 0700) Arterial Line BP: (81-131)/(42-72) 118/62 mmHg (05/17 0700) FiO2 (%):  [4 %-100 %] 4 % (05/16 1800) Weight:  [214 lb 8.1 oz (97.3 kg)] 214 lb 8.1 oz (97.3 kg) (05/17 0500)  Hemodynamic parameters for last 24 hours: PAP: (24-41)/(12-24) 39/24 mmHg CO:  [4.1 L/min-5.5 L/min] 5.2 L/min CI:  [1.9 L/min/m2-2.6 L/min/m2] 2.4 L/min/m2  Intake/Output from previous day: 05/16 0701 - 05/17 0700 In: 9947.3 [I.V.:6145.3; Blood:350; IV Piggyback:3452] Out: 8119 [JYNWG:9562; Emesis/NG output:100; Blood:1550; Chest Tube:380] Intake/Output this shift:    General appearance: alert and no distress Neurologic: intact Heart: regular rate and rhythm Lungs: diminished breath sounds bibasilar Abdomen: normal findings: soft, non-tender  Lab Results:  Basename 08/16/11 0355 08/15/11 1950  WBC 9.2 8.6  HGB 6.7* 7.1*  HCT 20.1* 21.1*  PLT 85* 101*   BMET:  Basename 08/16/11 0355 08/15/11 1950 08/15/11 1946 08/13/11 1604  NA 139 -- 145 --  K 3.8 -- 3.7 --  CL 108 -- 108 --  CO2 25 -- -- 27  GLUCOSE 116* -- 172* --  BUN 9 -- 6 --  CREATININE 0.94 0.77 -- --  CALCIUM 7.5* -- -- 9.6    PT/INR:  Basename 08/15/11 1329  LABPROT 21.3*  INR 1.81*   ABG    Component Value Date/Time   PHART 7.316* 08/15/2011 1858   HCO3 23.7 08/15/2011 1858   TCO2 24 08/15/2011 1946   ACIDBASEDEF 2.0 08/15/2011 1858   O2SAT 92.0 08/15/2011 1858   CBG (last 3)   Basename 08/16/11 0303 08/16/11 0200 08/16/11 0110  GLUCAP 110* 101* 90    Assessment/Plan: S/P Procedure(s) (LRB): AORTIC  VALVE REPLACEMENT (AVR) (N/A) POD # 1 mechanical AVR CV- good index, still on neo gtt, wean as transfused  Start coumadin for mechanical valve RESP- pulmonary toilet RENAL- supplement K, diurese ENDO- transition to lantus/ SSI DEPRESSION/ANXIETY- effexor, xanax D/C CT OOB, ambulate   LOS: 1 day    [name] C 08/16/2011

## 2011-08-16 NOTE — Progress Notes (Signed)
[facility] BRIEF SICU PROGRESS NOTE  1 Day Post-Op  S/P Procedure(s) (LRB): AORTIC VALVE REPLACEMENT (AVR) (N/A)   Stable day AAI paced, BP stable off drips Good UOP  Plan: Continue current plan  Arnecia Ector H 08/16/2011 8:05 PM

## 2011-08-16 NOTE — Progress Notes (Signed)
Clinical Social Work Department CLINICAL SOCIAL WORK PLACEMENT NOTE 08/16/2011  Patient:  [name], [name]  Account Number:  [id number] Admit date:  08/15/2011  Clinical Social Worker:  Baxter Flattery, LCSWA  Date/time:  08/16/2011 10:00 AM  Clinical Social Work is seeking post-discharge placement for this patient at the following level of care:   SKILLED NURSING   (*CSW will update this form in Epic as items are completed)   08/16/2011  Patient/family provided with [facility] Department of Clinical Social Work's list of facilities offering this level of care within the geographic area requested by the patient (or if unable, by the patient's family).  08/16/2011  Patient/family informed of their freedom to choose among providers that offer the needed level of care, that participate in Medicare, Medicaid or managed care program needed by the patient, have an available bed and are willing to accept the patient.  08/16/2011  Patient/family informed of [facility]' ownership interest in [facility], as well as of the fact that they are under no obligation to receive care at this facility.  PASARR submitted to EDS on  PASARR number received from EDS on   FL2 transmitted to all facilities in geographic area requested by pt/family on  08/16/2011 FL2 transmitted to all facilities within larger geographic area on   Patient informed that his/her managed care company has contracts with or will negotiate with  certain facilities, including the following:     Patient/family informed of bed offers received:   Patient chooses bed at  Physician recommends and patient chooses bed at    Patient to be transferred to  on   Patient to be transferred to facility by   The following physician request were entered in Epic:   Additional Comments: [place] area

## 2011-08-16 NOTE — Op Note (Signed)
NAMEMILAS, SCHAPPELL NO.:  [id number]  MEDICAL RECORD NO.:  [id number]  LOCATION:  2308                         FACILITY:  [facility]  PHYSICIAN:  Salvatore Decent. Dorris Fetch, M.D.DATE OF BIRTH:  1950-07-09  DATE OF PROCEDURE:  08/15/2011 DATE OF DISCHARGE:                              OPERATIVE REPORT   PREOPERATIVE DIAGNOSIS:  Severe aortic stenosis.  POSTOPERATIVE DIAGNOSIS:  Severe aortic stenosis.  PROCEDURE:  Aortic valve replacement with 25-mm St. Jude mechanical valve.  SURGEON:  Salvatore Decent. Dorris Fetch, MD  ASSISTANT:  Coral Ceo, PA  ANESTHESIA:  General.  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 Postbypass transesophageal echocardiography revealed good function of the prosthetic valve with no perivalvular leaks.  CLINICAL INDICATION:  [name] is a 61 year old gentleman who has been followed for aortic stenosis.  He has been having chest pain and echocardiogram revealed progression from moderate-to-severe aortic stenosis.  The patient was advised to undergo aortic valve replacement. The indications risks, benefits, and alternatives were discussed in detail with the patient.  He understood and accepted the risks and agreed to proceed.  We did discuss valve options of tissue versus mechanical valves.  The patient is familiar with mechanical valves and the need for lifelong anticoagulation and wishes to have a mechanical valve to minimize the risk of need for redo aortic valve replacement.  OPERATIVE NOTE:  [name] was brought to the preoperative holding area on Aug 15, 2011, there, the Anesthesia Service placed a Swan-Ganz catheter and an arterial blood pressure  monitoring line.  Intravenous antibiotics were administered.  The patient was taken to the operating room, anesthetized, and intubated.  Foley catheter was placed.  The chest, abdomen, and legs were prepped and draped in usual sterile fashion.  Median sternotomy was performed.  Hemostasis was achieved.  The patient then was fully heparinized.  The pericardium was opened.  The ascending aorta was inspected.  It was of normal size with no palpable atherosclerotic disease.  After confirming adequate anticoagulation with ACT measurement, the aorta was cannulated via concentric 2-0 Ethibond pledgeted sutures.  A dual-stage venous cannula was placed via pursestring suture in the right atrial appendage.  Cardiopulmonary bypass was instituted and the patient was cooled to 32 degrees Celsius. The left ventricular vent was placed via pursestring suture in the right superior pulmonary vein.  A retrograde cardioplegic cannula was placed via pursestring suture in the right atrium and directed into the coronary sinus.  An antegrade cardioplegic cannula was placed in the ascending aorta.  A temperature probe was placed in the myocardial septum.  The aorta was crossclamped.  The left ventricle was emptied via the vents.  Cardiac arrest then was achieved with combination of cold antegrade and retrograde blood cardioplegia and topical iced saline.  An initial 500 mL of cardioplegia was administered antegrade, there was rapid diastolic arrest.  An additional 500 mL cardioplegia was administered via the retrograde cannula with additional septal cooling to less than 10 degrees  Celsius.  Additional cardioplegia was administered via the retrograde cannula at 15-20 minutes intervals throughout the crossclamp portion of the procedure.  An aortotomy was made.  It was noted that there was a lot of bleeding around the suture sites for the antegrade cardioplegic cannula.  The aortic wall was relatively soft.   There was some plaque just distal to the left main orifice, but otherwise the aorta was free of atherosclerotic disease.  The aortic valve was inspected.  There were anterior and posterior valve leaflets, 180 degree commissures.  The valve was heavily calcified as was the anulus.  The leaflets were excised and sent for pathology.  The aortic anulus was debrided taking care to entrapped all calcific debris.  Particular care was taken in the area of the membranous septum, although this area was very heavily calcified.  After debriding the anulus, it was copiously irrigated with iced saline. Coronary ostia were inspected and were oriented at 180 degrees anteriorly and posteriorly.  The anulus sized for a 25-mm St. Jude mechanical valve.  The 2-0 Ethibond horizontal mattress sutures were placed with subannular pledgets, 16 sutures were utilized.  The sutures then were passed through the sewing ring of the valve.  The valve was lowered into place and the suture was sequentially tied periodically spreading the valve leaflets with a Q-tip to ensure that the valve was well seated.  After the valve had been implanted, the anulus was carefully probed with a fine tip right-angle clamp and no gaps were noted.  Rewarming was begun.  The aortotomy then was closed in 2 layers with a running 4-0 Prolene horizontal mattress suture followed by a running 4-0 Prolene simple suture.  The patient was placed in Trendelenburg position.  The left ventricular vent was turned off.  The aortic root vent was left on.  A warm dose of retrograde cardioplegia was administered.  After completing that, de-airing maneuvers were performed and the aortic crossclamp was removed.  The total crossclamp time was 98 minutes.  The patient initially fibrillated and after a single defibrillation with 10 joules was in complete heart block with a very slow ventricular escape rhythm. After removing the crossclamp, it was noted that  there was significant bleeding from the aorta just above the aortotomy closure medially next to the main pulmonary artery.  Multiple 4-0 Prolene pledgeted sutures were used to control this bleeding.  After the patient had rewarmed to 37 degrees Celsius, additional de- airing was performed.  It should be noted the carbon dioxide was insufflated into the operative field during the entire open portion of the procedure.  After performing additional de-airing, the retrograde cardioplegic cannula and left ventricular vent were removed and 16-gauge Angiocath was placed through the apex of the left ventricle, and the left ventricle was allowed to fill to remove any remaining entrapped air.  With the patient is still in Trendelenburg position, he then was weaned from cardiopulmonary bypass.  On the first attempt, he was DDD paced at 80 beats per minute.  He was not on inotrope support at the time of separation from bypass.  A test dose of protamine was given and well tolerated.  It was noted at this point that there was additional bleeding from the aorta again likely from suture pulled through from the previous sutures.  Additional 4-0 Prolene pledgeted sutures were necessary to achieve final hemostasis and MRDH was applied and pressure held and finally, there was good hemostasis in the area.  The atrial and aortic  cannulae were then removed.  The remainder of the protamine was given without incident. The chest was irrigated with warm saline.  Hemostasis was achieved.  At this point, the patient was relatively hypotensive.  Inspection with the echocardiogram revealed good left ventricular function.  The left ventricle was relatively underfilled.  As volume resuscitation continued, the patient was started on a low-dose dopamine infusion to support the blood pressure and cardiac output until resuscitation could catch up with the previous blood loss.  A 28-French Blake drain was placed through a  separate stab wound incision and placed into the anterior mediastinum.  The pericardium was closed with interrupted 3-0 silk sutures, and came together easily without tension.  The sternum was closed with a combination of single and double heavy gauge stainless steel wires.  The pectoralis fascia, subcutaneous tissue, and skin were closed in standard fashion.  All sponge, needle, and instrument counts were correct at the end of the procedure and the patient was taken from the operating room to the surgical intensive care unit in good condition.     Salvatore Decent Dorris Fetch, M.D.     SCH/MEDQ  D:  08/15/2011  T:  08/16/2011  Job:  [id number]

## 2011-08-17 ENCOUNTER — Inpatient Hospital Stay (HOSPITAL_COMMUNITY): Payer: PRIVATE HEALTH INSURANCE

## 2011-08-17 LAB — BASIC METABOLIC PANEL
Calcium: 8.5 mg/dL (ref 8.4–10.5)
GFR calc Af Amer: 70 mL/min — ABNORMAL LOW (ref >90)
GFR calc non Af Amer: 61 mL/min — ABNORMAL LOW (ref >90)
Glucose, Bld: 111 mg/dL — ABNORMAL HIGH (ref 70–99)
Potassium: 3.7 mEq/L (ref 3.5–5.1)
Sodium: 135 mEq/L (ref 135–145)

## 2011-08-17 LAB — CBC
Hemoglobin: 8.5 g/dL — ABNORMAL LOW (ref 13.0–17.0)
Platelets: 71 10*3/uL — ABNORMAL LOW (ref 150–400)
RBC: 2.69 MIL/uL — ABNORMAL LOW (ref 4.22–5.81)
WBC: 11.2 10*3/uL — ABNORMAL HIGH (ref 4.0–10.5)

## 2011-08-17 LAB — GLUCOSE, CAPILLARY
Glucose-Capillary: 110 mg/dL — ABNORMAL HIGH (ref 70–99)
Glucose-Capillary: 107 mg/dL — ABNORMAL HIGH (ref 70–99)

## 2011-08-17 LAB — TYPE AND SCREEN
ABO/RH(D): A NEG
Antibody Screen: NEGATIVE
Unit division: 0

## 2011-08-17 LAB — PROTIME-INR
INR: 1.48 (ref 0.00–1.49)
Prothrombin Time: 18.2 seconds — ABNORMAL HIGH (ref 11.6–15.2)

## 2011-08-17 MED ORDER — FUROSEMIDE 10 MG/ML IJ SOLN
40.0000 mg | Freq: Once | INTRAMUSCULAR | Status: AC
  Administered 2011-08-17: 40 mg via INTRAVENOUS
  Filled 2011-08-17: qty 4

## 2011-08-17 MED ORDER — INSULIN ASPART 100 UNIT/ML ~~LOC~~ SOLN: 0.0000 [IU] | Freq: Three times a day (TID) | SUBCUTANEOUS | Status: DC

## 2011-08-17 MED ORDER — METOPROLOL TARTRATE 12.5 MG HALF TABLET
12.5000 mg | ORAL_TABLET | Freq: Two times a day (BID) | ORAL | Status: DC
  Administered 2011-08-17 – 2011-08-19 (×4): 12.5 mg via ORAL
  Filled 2011-08-17 (×7): qty 1

## 2011-08-17 MED ORDER — ASPIRIN EC 81 MG PO TBEC
81.0000 mg | DELAYED_RELEASE_TABLET | Freq: Every day | ORAL | Status: DC
  Administered 2011-08-18 – 2011-08-28 (×10): 81 mg via ORAL
  Filled 2011-08-17 (×12): qty 1

## 2011-08-17 MED ORDER — POTASSIUM CHLORIDE CRYS ER 20 MEQ PO TBCR
20.0000 meq | EXTENDED_RELEASE_TABLET | Freq: Two times a day (BID) | ORAL | Status: DC
  Administered 2011-08-17 – 2011-08-21 (×8): 20 meq via ORAL
  Filled 2011-08-17 (×12): qty 1

## 2011-08-17 MED ORDER — WARFARIN SODIUM 5 MG PO TABS: 5.0000 mg | ORAL_TABLET | Freq: Once | ORAL | Status: DC

## 2011-08-17 MED ORDER — WARFARIN SODIUM 2.5 MG PO TABS
2.5000 mg | ORAL_TABLET | Freq: Once | ORAL | Status: AC
  Administered 2011-08-17: 2.5 mg via ORAL
  Filled 2011-08-17 (×2): qty 1

## 2011-08-17 MED ORDER — SODIUM CHLORIDE 0.9 % IV SOLN
250.0000 mL | INTRAVENOUS | Status: DC | PRN
  Administered 2011-08-17: 500 mL via INTRAVENOUS

## 2011-08-17 MED ORDER — FUROSEMIDE 40 MG PO TABS
40.0000 mg | ORAL_TABLET | Freq: Two times a day (BID) | ORAL | Status: DC
  Administered 2011-08-17 – 2011-08-18 (×2): 40 mg via ORAL
  Filled 2011-08-17 (×3): qty 1

## 2011-08-17 MED ORDER — MOVING RIGHT ALONG BOOK
Freq: Once | Status: DC
  Filled 2011-08-17: qty 1

## 2011-08-17 MED ORDER — POTASSIUM CHLORIDE 10 MEQ/50ML IV SOLN
10.0000 meq | INTRAVENOUS | Status: AC | PRN
  Administered 2011-08-17 (×3): 10 meq via INTRAVENOUS
  Filled 2011-08-17 (×3): qty 50

## 2011-08-17 MED ORDER — TRAMADOL HCL 50 MG PO TABS: 50.0000 mg | ORAL_TABLET | ORAL | Status: DC | PRN

## 2011-08-17 MED ORDER — SODIUM CHLORIDE 0.9 % IJ SOLN
3.0000 mL | Freq: Two times a day (BID) | INTRAMUSCULAR | Status: DC
  Administered 2011-08-18 – 2011-08-21 (×7): 3 mL via INTRAVENOUS

## 2011-08-17 MED ORDER — SODIUM CHLORIDE 0.9 % IJ SOLN: 3.0000 mL | INTRAMUSCULAR | Status: DC | PRN

## 2011-08-17 NOTE — Progress Notes (Addendum)
Patient C/O pain during cough denies SOB.  O2 Sats dropped to 85% on 50% Venturi Mask.  No change in physical assessment-lungs remain clear and diminished in bases although cough sounds congested.  Cough is nonproductive.  Placed on 80% partial nonrebreather.  Given Oxydone IR 10 mg PO PRN Pain which patient rates 8/10.  O2 sats now 93-94%.  States breathing feels better.  Continue to monitor.

## 2011-08-17 NOTE — Progress Notes (Addendum)
   CARDIOTHORACIC SURGERY PROGRESS NOTE  2 Days Post-Op  S/P Procedure(s) (LRB): AORTIC VALVE REPLACEMENT (AVR) (N/A)  Subjective: Feels pretty well.  Had a bad night due to soreness in his chest, but feels much better today.  Objective: Vital signs in last 24 hours: Temp:  [97.4 F (36.3 C)-98.6 F (37 C)] 97.4 F (36.3 C) (05/18 1200) Pulse Rate:  [78-110] 96  (05/18 1400) Cardiac Rhythm:  [-] Sinus tachycardia (05/18 1200) Resp:  [12-26] 19  (05/18 1400) BP: (88-108)/(49-70) 99/60 mmHg (05/18 1400) SpO2:  [85 %-96 %] 91 % (05/18 1400) Arterial Line BP: (92-124)/(52-70) 105/66 mmHg (05/17 1900) FiO2 (%):  [4 %-80 %] 50 % (05/18 1100) Weight:  [101.4 kg (223 lb 8.7 oz)] 101.4 kg (223 lb 8.7 oz) (05/18 0500)  Physical Exam:  Rhythm:   sinus  Breath sounds: clear  Heart sounds:  RRR with mechanical sounds  Incisions:  Dressings dry  Abdomen:  soft  Extremities:  warm   Intake/Output from previous day: 05/17 0701 - 05/18 0700 In: 1901.4 [P.O.:160; I.V.:1078.9; Blood:312.5; IV Piggyback:350] Out: 1685 [Urine:1635; Chest Tube:50] Intake/Output this shift: Total I/O In: 410 [P.O.:240; I.V.:120; IV Piggyback:50] Out: 260 [Urine:260]  Lab Results:  Basename 08/17/11 0351 08/16/11 1730 08/16/11 1700  WBC 11.2* -- 11.4*  HGB 8.5* 10.9* --  HCT 24.6* 32.0* --  PLT 71* -- 73*   BMET:  Basename 08/17/11 0351 08/16/11 1730 08/16/11 0355  NA 135 139 --  K 3.7 3.9 --  CL 103 104 --  CO2 26 -- 25  GLUCOSE 111* 105* --  BUN 16 13 --  CREATININE 1.26 1.10 --  CALCIUM 8.5 -- 7.5*    CBG (last 3)   Basename 08/17/11 1141 08/17/11 0723 08/17/11 0352  GLUCAP 107* 94 110*   PT/INR:   Basename 08/17/11 0351  LABPROT 18.2*  INR 1.48    CXR:  PORTABLE CHEST - 1 VIEW  Comparison: 08/16/2011; 08/15/2011; 08/13/2011  Findings:  Grossly unchanged enlarged cardiac silhouette and mediastinal  contours post median sternotomy and aortic valve  replacement/repair. Interval  removal of PA catheter with remaining  vascular sheath tip overlying the mid SVC. Lung volumes remain  reduced. Unchanged small bilateral effusions. Pulmonary  vasculature remains indistinct. No pneumothorax. Grossly unchanged  bones.  IMPRESSION:  1. Interval removal of a PA catheter with remaining vascular  sheath tip overlying the mid SVC.  2. Persistently reduced lung volumes with bibasilar opacities,  likely atelectasis  3. Unchanged findings of mild pulmonary edema and small bilateral  effusions.  Original Report Authenticated By: Waynard Reeds, M.D.   Assessment/Plan: S/P Procedure(s) (LRB): AORTIC VALVE REPLACEMENT (AVR) (N/A)  Overall stable POD2 Expected post op acute blood loss anemia, mild, stable Expected post op volume excess, mild, diuresing some Postop atelectasis with somewhat increased O2 requirement Type II diabetes mellitus, glycemic control excellent    Mobilize  Diuresis  Encourage pulm toilet, deep breathing  Continue lantus insulin  Coumadin   Tylan Briguglio H 08/17/2011 3:16 PM

## 2011-08-17 NOTE — Evaluation (Signed)
Physical Therapy Evaluation Patient Details Name: [name] MRN: [id number] DOB: 1951/02/03 Today's Date: 08/17/2011 Time: 1478-2956 PT Time Calculation (min): 15 min  PT Assessment / Plan / Recommendation Clinical Impression  Patient s/p AVR with respiratory issues post op.  Was able to get OOB-chair with PT but could not do more secondary to poor endurance.  WIll continue to progreess.  Lives alone so it may be best to have short term NHP stay prior to d/c home.      PT Assessment  Patient needs continued PT services    Follow Up Recommendations  Skilled nursing facility;Supervision/Assistance - 24 hour    Barriers to Discharge Decreased caregiver support      lEquipment Recommendations  Rolling walker with 5" wheels;3 in 1 bedside comode    Recommendations for Other Services OT consult   Frequency Min 3X/week    Precautions / Restrictions Precautions Precautions: Fall;Sternal Restrictions Weight Bearing Restrictions: No   Pertinent Vitals/Pain HR 90-106 bpm, Desat to 88% with activity., Some pain      Mobility  Bed Mobility Bed Mobility: Rolling Right;Right Sidelying to Sit;Sitting - Scoot to [place] of Bed Rolling Right: 1: +2 Total assist;With rail Rolling Right: Patient Percentage: 50% Right Sidelying to Sit: 1: +2 Total assist;With rails;HOB elevated (at 20 degrees) Right Sidelying to Sit: Patient Percentage: 50% Sitting - Scoot to Edge of Bed: 3: Mod assist Details for Bed Mobility Assistance: Patient needed assist to follow sternal precautions with bed mobility - held pillow at chest to ensure that he followed.  Needed assist for elevation of trunk  Transfers Transfers: Sit to Stand;Stand to Sit Sit to Stand: 1: +2 Total assist;From elevated surface;Without upper extremity assist;From bed (Patient held pillow) Sit to Stand: Patient Percentage: 70% Stand to Sit: 1: +2 Total assist;Without upper extremity assist;To chair/3-in-1 Stand to Sit: Patient Percentage:  70% Details for Transfer Assistance: Patient needed constant cues to maintain sternal precautions.  Patient somewhat impulsive with sit to stand once movement initiated.   Ambulation/Gait Ambulation/Gait Assistance: 1: +2 Total assist Ambulation/Gait: Patient Percentage: 80% Ambulation Distance (Feet): 4 Feet Assistive device: Rolling walker Ambulation/Gait Assistance Details: Patient took 4 steps to recliner.  Used RW and did well overall.  Desat to 88% on 80% face mask.  Recovered to 94 % once settled in chair.  Breakfast came and patient inquired about removing face mask.  Nursing ok'd putting patient on 6 L so he could try breakfast.   Gait Pattern: Decreased stride length Stairs: No Wheelchair Mobility Wheelchair Mobility: No         PT Diagnosis: Difficulty walking  PT Problem List: Decreased activity tolerance;Decreased balance;Decreased mobility;Decreased knowledge of precautions;Decreased safety awareness;Decreased knowledge of use of DME;Pain PT Treatment Interventions: DME instruction;Gait training;Functional mobility training;Therapeutic activities;Therapeutic exercise;Balance training;Patient/family education;Stair training   PT Goals Acute Rehab PT Goals PT Goal Formulation: With patient Time For Goal Achievement: 08/31/11 Potential to Achieve Goals: Good Pt will go Supine/Side to Sit: with modified independence PT Goal: Supine/Side to Sit - Progress: Goal set today Pt will go Sit to Stand: with modified independence;without upper extremity assist (folloowing sternal precautions) PT Goal: Sit to Stand - Progress: Goal set today Pt will go Stand to Sit: with modified independence (folloowing sternal precautions) PT Goal: Stand to Sit - Progress: Goal set today Pt will Transfer Bed to Chair/Chair to Bed: with modified independence PT Transfer Goal: Bed to Chair/Chair to Bed - Progress: Goal set today Pt will Ambulate: 51 - 150 feet;with modified  independence;with least  restrictive assistive device PT Goal: Ambulate - Progress: Goal set today Pt will Go Up / Down Stairs: 1-2 stairs;with modified independence;with least restrictive assistive device PT Goal: Up/Down Stairs - Progress: Goal set today Additional Goals Additional Goal #1: Patient to follow sternal precautions with all mobility.   PT Goal: Additional Goal #1 - Progress: Goal set today  Visit Information  Last PT Received On: 08/17/11 Assistance Needed: +2    Subjective Data  Subjective: "I didnt do good yesterday." Patient Stated Goal: To get home   Prior Functioning  Home Living Lives With: Alone Type of Home: Apartment Home Access: Stairs to enter Entrance Stairs-Number of Steps: 1 Entrance Stairs-Rails: None Home Layout: One level Bathroom Shower/Tub: Engineer, manufacturing systems: Standard Home Adaptive Equipment: None Prior Function Level of Independence: Independent Able to Take Stairs?: Yes Driving: Yes Vocation: On disability Communication Communication: No difficulties Dominant Hand: Right    Cognition  Overall Cognitive Status: Appears within functional limits for tasks assessed/performed Arousal/Alertness: Awake/alert Orientation Level: Appears intact for tasks assessed Behavior During Session: [facility] for tasks performed    Extremity/Trunk Assessment Right Upper Extremity Assessment RUE ROM/Strength/Tone: Within functional levels RUE Sensation: WFL - Light Touch RUE Coordination: WFL - gross/fine motor Left Upper Extremity Assessment LUE ROM/Strength/Tone: Within functional levels LUE Sensation: WFL - Light Touch LUE Coordination: WFL - gross/fine motor Right Lower Extremity Assessment RLE ROM/Strength/Tone: Within functional levels RLE Sensation: WFL - Light Touch RLE Coordination: WFL - gross/fine motor Left Lower Extremity Assessment LLE ROM/Strength/Tone: Within functional levels LLE Sensation: WFL - Light Touch LLE Coordination: WFL - gross/fine  motor Trunk Assessment Trunk Assessment: Normal   Balance  N/A  End of Session PT - End of Session Equipment Utilized During Treatment: Gait belt Activity Tolerance: Treatment limited secondary to medical complications (Comment) (desats on facemask) Patient left: in chair;with call bell/phone within reach Nurse Communication: Mobility status   INGOLD,Imaya Duffy 08/17/2011, 10:27 AM  [facility] [phone] (pager)

## 2011-08-18 ENCOUNTER — Inpatient Hospital Stay (HOSPITAL_COMMUNITY): Payer: PRIVATE HEALTH INSURANCE

## 2011-08-18 LAB — GLUCOSE, CAPILLARY
Glucose-Capillary: 77 mg/dL (ref 70–99)
Glucose-Capillary: 84 mg/dL (ref 70–99)
Glucose-Capillary: 67 mg/dL — ABNORMAL LOW (ref 70–99)

## 2011-08-18 LAB — CBC
Hemoglobin: 8.2 g/dL — ABNORMAL LOW (ref 13.0–17.0)
MCH: 31.4 pg (ref 26.0–34.0)
MCHC: 34.2 g/dL (ref 30.0–36.0)

## 2011-08-18 LAB — BASIC METABOLIC PANEL
Calcium: 8 mg/dL — ABNORMAL LOW (ref 8.4–10.5)
GFR calc non Af Amer: 72 mL/min — ABNORMAL LOW (ref >90)
Glucose, Bld: 94 mg/dL (ref 70–99)
Sodium: 133 mEq/L — ABNORMAL LOW (ref 135–145)

## 2011-08-18 LAB — PROTIME-INR
INR: 1.55 — ABNORMAL HIGH (ref 0.00–1.49)
Prothrombin Time: 18.9 seconds — ABNORMAL HIGH (ref 11.6–15.2)

## 2011-08-18 MED ORDER — FUROSEMIDE 10 MG/ML IJ SOLN
40.0000 mg | Freq: Four times a day (QID) | INTRAMUSCULAR | Status: AC
  Administered 2011-08-18 – 2011-08-19 (×3): 40 mg via INTRAVENOUS
  Filled 2011-08-18 (×3): qty 4

## 2011-08-18 MED ORDER — WARFARIN SODIUM 2.5 MG PO TABS
2.5000 mg | ORAL_TABLET | Freq: Once | ORAL | Status: AC
  Administered 2011-08-18: 2.5 mg via ORAL
  Filled 2011-08-18: qty 1

## 2011-08-18 NOTE — Progress Notes (Signed)
[facility] BRIEF SICU PROGRESS NOTE  3 Days Post-Op  S/P Procedure(s) (LRB): AORTIC VALVE REPLACEMENT (AVR) (N/A)   Stable day Still not motivated at all O2 requirement a little better UOP somewhat increased  Plan: Continue current plan  Purcell Nails 08/18/2011 6:23 PM

## 2011-08-18 NOTE — Progress Notes (Addendum)
   CARDIOTHORACIC SURGERY PROGRESS NOTE   R3 Days Post-Op Procedure(s) (LRB): AORTIC VALVE REPLACEMENT (AVR) (N/A)  Subjective: Feels okay.  Hypoxic overnight and still requiring increased O2. Pain improved but still not taking deep breaths.  Very difficult to mobilize.  Objective: Vital signs: BP Readings from Last 1 Encounters:  08/18/11 88/44   Pulse Readings from Last 1 Encounters:  08/18/11 103   Resp Readings from Last 1 Encounters:  08/18/11 23   Temp Readings from Last 1 Encounters:  08/18/11 97.7 F (36.5 C) Axillary    Hemodynamics:    Physical Exam:  Rhythm:   sinus  Breath sounds: Poor inspiration, bibasilar crackles  Heart sounds:  RRR  Incisions:  dry  Abdomen:  Soft, non distended, non tender  Extremities:  Warm, well perfused   Intake/Output from previous day: 05/18 0701 - 05/19 0700 In: 720 [P.O.:450; I.V.:220; IV Piggyback:50] Out: 1470 [Urine:1470] Intake/Output this shift: Total I/O In: 344 [P.O.:120; I.V.:224] Out: -   Lab Results:  Basename 08/18/11 0450 08/17/11 0351  WBC 13.3* 11.2*  HGB 8.2* 8.5*  HCT 24.0* 24.6*  PLT 80* 71*   BMET:  Basename 08/18/11 0450 08/17/11 0351  NA 133* 135  K 4.4 3.7  CL 100 103  CO2 23 26  GLUCOSE 94 111*  BUN 19 16  CREATININE 1.09 1.26  CALCIUM 8.0* 8.5    CBG (last 3)   Basename 08/18/11 0732 08/17/11 1935 08/17/11 1549  GLUCAP 91 86 86   INR  1.55  ABG    Component Value Date/Time   PHART 7.316* 08/15/2011 1858   HCO3 23.7 08/15/2011 1858   TCO2 25 08/16/2011 1730   ACIDBASEDEF 2.0 08/15/2011 1858   O2SAT 92.0 08/15/2011 1858   CXR: Increased CHF, bibasilar atelectasis  Assessment/Plan: S/P Procedure(s) (LRB): AORTIC VALVE REPLACEMENT (AVR) (N/A)  Stable hemodynamics Expected post op acute blood loss anemia, mild, stable Expected post op volume excess, moderate, modest diuresis Postop atelectasis   Increase lasix  Mobilize  Encourage pulm  toilet  coumadin   Yuta Cipollone H 08/18/2011 10:30 AM

## 2011-08-19 ENCOUNTER — Inpatient Hospital Stay (HOSPITAL_COMMUNITY): Payer: PRIVATE HEALTH INSURANCE

## 2011-08-19 LAB — BASIC METABOLIC PANEL
Chloride: 101 mEq/L (ref 96–112)
GFR calc Af Amer: 81 mL/min — ABNORMAL LOW (ref >90)
GFR calc non Af Amer: 70 mL/min — ABNORMAL LOW (ref >90)
Glucose, Bld: 81 mg/dL (ref 70–99)
Potassium: 3.9 mEq/L (ref 3.5–5.1)
Sodium: 138 mEq/L (ref 135–145)

## 2011-08-19 LAB — CBC
HCT: 25.7 % — ABNORMAL LOW (ref 39.0–52.0)
Hemoglobin: 8.8 g/dL — ABNORMAL LOW (ref 13.0–17.0)
RBC: 2.75 MIL/uL — ABNORMAL LOW (ref 4.22–5.81)

## 2011-08-19 LAB — PROTIME-INR
INR: 1.33 (ref 0.00–1.49)
Prothrombin Time: 16.7 seconds — ABNORMAL HIGH (ref 11.6–15.2)

## 2011-08-19 LAB — GLUCOSE, CAPILLARY
Glucose-Capillary: 67 mg/dL — ABNORMAL LOW (ref 70–99)
Glucose-Capillary: 92 mg/dL (ref 70–99)

## 2011-08-19 MED ORDER — FUROSEMIDE 40 MG PO TABS
40.0000 mg | ORAL_TABLET | Freq: Two times a day (BID) | ORAL | Status: DC
  Administered 2011-08-19 (×2): 40 mg via ORAL
  Filled 2011-08-19 (×4): qty 1

## 2011-08-19 MED ORDER — LEVALBUTEROL HCL 0.63 MG/3ML IN NEBU
0.6300 mg | INHALATION_SOLUTION | Freq: Three times a day (TID) | RESPIRATORY_TRACT | Status: DC
  Administered 2011-08-19 – 2011-08-21 (×7): 0.63 mg via RESPIRATORY_TRACT
  Filled 2011-08-19 (×9): qty 3

## 2011-08-19 MED ORDER — WARFARIN SODIUM 5 MG PO TABS
5.0000 mg | ORAL_TABLET | Freq: Once | ORAL | Status: AC
  Administered 2011-08-19: 5 mg via ORAL
  Filled 2011-08-19: qty 1

## 2011-08-19 MED ORDER — WARFARIN SODIUM 5 MG IV SOLR: 5.0000 mg | Freq: Once | INTRAVENOUS | Status: DC

## 2011-08-19 MED FILL — Electrolyte-R (PH 7.4) Solution: INTRAVENOUS | Qty: 5000 | Status: AC

## 2011-08-19 MED FILL — Lidocaine HCl IV Inj 20 MG/ML: INTRAVENOUS | Qty: 5 | Status: AC

## 2011-08-19 MED FILL — Mannitol IV Soln 20%: INTRAVENOUS | Qty: 500 | Status: AC

## 2011-08-19 MED FILL — Potassium Chloride Inj 2 mEq/ML: INTRAVENOUS | Qty: 40 | Status: AC

## 2011-08-19 MED FILL — Sodium Chloride IV Soln 0.9%: INTRAVENOUS | Qty: 1000 | Status: AC

## 2011-08-19 MED FILL — Heparin Sodium (Porcine) Inj 1000 Unit/ML: INTRAMUSCULAR | Qty: 10 | Status: AC

## 2011-08-19 MED FILL — Heparin Sodium (Porcine) Inj 1000 Unit/ML: INTRAMUSCULAR | Qty: 30 | Status: AC

## 2011-08-19 MED FILL — Dexmedetomidine HCl IV Soln 200 MCG/2ML: INTRAVENOUS | Qty: 2 | Status: AC

## 2011-08-19 MED FILL — Sodium Chloride Irrigation Soln 0.9%: Qty: 3000 | Status: AC

## 2011-08-19 MED FILL — Magnesium Sulfate Inj 50%: INTRAMUSCULAR | Qty: 10 | Status: AC

## 2011-08-19 MED FILL — Sodium Bicarbonate IV Soln 8.4%: INTRAVENOUS | Qty: 50 | Status: AC

## 2011-08-19 NOTE — Progress Notes (Signed)
Physical Therapy Treatment Patient Details Name: [name] MRN: [id number] DOB: 03-16-1951 Today's Date: 08/19/2011 Time: 8469-6295 PT Time Calculation (min): 22 min  PT Assessment / Plan / Recommendation Comments on Treatment Session  Pt s/p AVR progressing well with mobility but continues to be limited by cognition, balance and potentially respiratory status. Pt educated several times for sternal precautions and able to recall 2 end of session. Will continue to follow to progress mobility.     Follow Up Recommendations       Barriers to Discharge        Equipment Recommendations       Recommendations for Other Services    Frequency     Plan Discharge plan remains appropriate;Frequency remains appropriate    Precautions / Restrictions Precautions Precautions: Sternal;Fall   Pertinent Vitals/Pain 3/10 pain reported 5L O2 throughout    Mobility  Bed Mobility Bed Mobility: Left Sidelying to Sit;Rolling Left;Sit to Sidelying Left Rolling Left: 4: Min guard Left Sidelying to Sit: 4: Min assist;HOB flat Sitting - Scoot to Edge of Bed: 5: Supervision Sit to Sidelying Left: 4: Min assist;HOB flat Details for Bed Mobility Assistance: cueing for precautions and sequence throughout with multiple periods of reinforcement due to lack of carry over of instruction Transfers Transfers: Sit to Stand;Stand to Sit;Stand Pivot Transfers Sit to Stand: 4: Min assist;From bed Stand to Sit: 4: Min assist;To bed;To chair/3-in-1 Stand Pivot Transfers: 4: Min assist Details for Transfer Assistance: cueing for hand placement, safety, sequence and precautions with mobility Ambulation/Gait Ambulation/Gait Assistance: 4: Min assist Ambulation Distance (Feet): 300 Feet Assistive device: Rolling walker Ambulation/Gait Assistance Details: Cueing throughout for breathing technique, stepping into RW and posture. Difficult to get accurate O2 sat while moving due to pt cold hands dipping as low as 77% on  monitor but no acute distress and back to 90 as soon as pt stopped and relieved pressure from his hand. Attempted 10 ft without RW but pt impulsively increases speed and demonstrates decreased safety with ambulation without RW Gait Pattern: Decreased stride length Gait velocity: 3ft/47sec=.64ft/sec placing pt at a high fall risk Stairs: No    Exercises General Exercises - Lower Extremity Long Arc Quad: AROM;Both;20 reps;Seated Hip Flexion/Marching: AROM;Both;15 reps;Seated   PT Diagnosis:    PT Problem List:   PT Treatment Interventions:     PT Goals Acute Rehab PT Goals PT Goal: Supine/Side to Sit - Progress: Progressing toward goal PT Goal: Sit to Stand - Progress: Progressing toward goal PT Goal: Stand to Sit - Progress: Progressing toward goal PT Transfer Goal: Bed to Chair/Chair to Bed - Progress: Progressing toward goal PT Goal: Ambulate - Progress: Progressing toward goal PT Goal: Up/Down Stairs - Progress: Progressing toward goal Additional Goals PT Goal: Additional Goal #1 - Progress: Progressing toward goal  Visit Information  Last PT Received On: 08/19/11 Assistance Needed: +1    Subjective Data  Subjective: it feels like bones rolling down the highway   Cognition  Overall Cognitive Status: Impaired Area of Impairment: Attention;Memory Arousal/Alertness: Awake/alert Orientation Level: Appears intact for tasks assessed Current Attention Level: Selective Memory: Decreased recall of precautions Memory Deficits: Pt unable to recall precautions until after 3 time of educating pt during tx Cognition - Other Comments: When asking pt if he had steps to enter home he started talking about the comforter in his bedroom    Balance     End of Session PT - End of Session Equipment Utilized During Treatment: Gait belt Activity Tolerance: Patient  tolerated treatment well Patient left: in chair;with call bell/phone within reach Nurse Communication: Mobility status     Delorse Lek 08/19/2011, 12:31 PM Delaney Meigs, PT [phone]

## 2011-08-19 NOTE — Progress Notes (Signed)
CSW spoke with pt and with pt sister regarding disposition. Pt drowsy and difficult to communicate, but he is reluctant at idea of SNF, though CSW advised him of concerns regarding his d/c plan:  - Pt states he will d/c home and his friend "Marchelle Folks" will provide care, however this person cannot be reached and has not come to hospital to visit pt during this hospitalization.   - Pt then states he will stay with his sister but she tells CSW she cannot provide care.   Concerns regarding pt mental health from this CSW and from pt sister, clearly his depression/anxiety impact his recovery, particularly with high level of anxiety meds he is accustomed to taking. Incidentally, pt has been referred for Level II PASSAR and CSW awaiting outcome of this evaluation.   CSW will continue to follow and hopeful pt will be more alert and able to participate in discussion and more agreeable to developing a realistic plan.   Appreciate MD discussing this issue with pt and encouraging ST SNF as a safe disposition.   Baxter Flattery, MSW [phone]

## 2011-08-19 NOTE — Significant Event (Signed)
At 1835pm, pt attempting to get out of chair to go to his car in the hospital garage. Pt assisted back to bed. Bed alarm turned on.GSC=14, oriented to self and location only.  At 1845pm, pt attempting to get out of bed, stated "he needed to go home". Pt adamant, threatening to hit staff if he is not allow to leave. Pt walked to door; staff at his side. HR in the 120s, on 4L [place]. Pt does not follow or listen to rationales for safety; refusing to return to bed or chair.  1900pm, sitter requested and is at bedside. Pt settled in the chair. VS stable at this time as pt is much more calmer; HR 100s, 96% on 4L [place]. Pt is being monitored closely. Jep Dyas, Charity fundraiser.

## 2011-08-19 NOTE — Progress Notes (Signed)
Patient ID: [name], male   DOB: 1950-06-27, 61 y.o.   MRN: [id number] .                   [address]            [place] [address]          [phone]     4 Days Post-Op Procedure(s) (LRB): AORTIC VALVE REPLACEMENT (AVR) (N/A)  Total Length of Stay:  LOS: 4 days  BP 118/67  Pulse 100  Temp(Src) 97.7 F (36.5 C) (Oral)  Resp 25  Ht 6' (1.829 m)  Wt 220 lb 12.8 oz (100.154 kg)  BMI 29.95 kg/m2  SpO2 98%      Lab Results  Component Value Date   WBC 12.1* 08/19/2011   HGB 8.8* 08/19/2011   HCT 25.7* 08/19/2011   PLT 145* 08/19/2011   GLUCOSE 81 08/19/2011   ALT 25 08/13/2011   AST 22 08/13/2011   NA 138 08/19/2011   K 3.9 08/19/2011   CL 101 08/19/2011   CREATININE 1.12 08/19/2011   BUN 25* 08/19/2011   CO2 25 08/19/2011   INR 1.33 08/19/2011   HGBA1C 6.1* 08/13/2011   Confused Wheezing now   [name] MD  Beeper [phone] Office [phone] 08/19/2011 4:42 PM

## 2011-08-19 NOTE — Progress Notes (Signed)
4 Days Post-Op Procedure(s) (LRB): AORTIC VALVE REPLACEMENT (AVR) (N/A) Subjective: Says he walked yesterday(100' per RN) Pain relatively well controlled Breathing more comfortably  Objective: Vital signs in last 24 hours: Temp:  [97.4 F (36.3 C)-98.9 F (37.2 C)] 98 F (36.7 C) (05/20 0726) Pulse Rate:  [78-103] 89  (05/20 0400) Cardiac Rhythm:  [-] Normal sinus rhythm (05/19 2000) Resp:  [14-26] 21  (05/20 0400) BP: (85-111)/(44-72) 85/66 mmHg (05/20 0600) SpO2:  [90 %-99 %] 98 % (05/20 0700) Weight:  [220 lb 12.8 oz (100.154 kg)] 220 lb 12.8 oz (100.154 kg) (05/20 0500)  Hemodynamic parameters for last 24 hours:    Intake/Output from previous day: 05/19 0701 - 05/20 0700 In: 1204 [P.O.:720; I.V.:484] Out: 2250 [Urine:2250] Intake/Output this shift:    General appearance: no distress Neurologic: flat affect Heart: RRR, good valve click Lungs: diminished breath sounds bibasilar Abdomen: normal findings: soft, non-tender Wound: clean and dry  Lab Results:  Basename 08/18/11 0450 08/17/11 0351  WBC 13.3* 11.2*  HGB 8.2* 8.5*  HCT 24.0* 24.6*  PLT 80* 71*   BMET:  Basename 08/18/11 0450 08/17/11 0351  NA 133* 135  K 4.4 3.7  CL 100 103  CO2 23 26  GLUCOSE 94 111*  BUN 19 16  CREATININE 1.09 1.26  CALCIUM 8.0* 8.5    PT/INR:  Basename 08/18/11 0450  LABPROT 18.9*  INR 1.55*   ABG    Component Value Date/Time   PHART 7.316* 08/15/2011 1858   HCO3 23.7 08/15/2011 1858   TCO2 25 08/16/2011 1730   ACIDBASEDEF 2.0 08/15/2011 1858   O2SAT 92.0 08/15/2011 1858   CBG (last 3)   Basename 08/19/11 0728 08/18/11 2308 08/18/11 2155  GLUCAP 67* 80 67*    Assessment/Plan: S/P Procedure(s) (LRB): AORTIC VALVE REPLACEMENT (AVR) (N/A) Mobilize CV- BP remains soft, maintaining SR, coumadin for mechanical valve RESP- pulmonary edema improved significantly, still needs to improve IS RENAL- lytes, creatinine OK, volume overloaded, diurese as BP allows ENDO- CBG  WNL, d/c lantus, SSI PT to work with him   LOS: 4 days    [name] C 08/19/2011

## 2011-08-20 ENCOUNTER — Inpatient Hospital Stay (HOSPITAL_COMMUNITY): Payer: PRIVATE HEALTH INSURANCE

## 2011-08-20 LAB — CBC
HCT: 25 % — ABNORMAL LOW (ref 39.0–52.0)
MCH: 30.9 pg (ref 26.0–34.0)
MCHC: 33.2 g/dL (ref 30.0–36.0)
MCV: 92.9 fL (ref 78.0–100.0)
RDW: 15.2 % (ref 11.5–15.5)

## 2011-08-20 LAB — BASIC METABOLIC PANEL
BUN: 21 mg/dL (ref 6–23)
Calcium: 8.7 mg/dL (ref 8.4–10.5)
Creatinine, Ser: 1.02 mg/dL (ref 0.50–1.35)
GFR calc Af Amer: >90 mL/min (ref >90)
GFR calc non Af Amer: 79 mL/min — ABNORMAL LOW (ref >90)

## 2011-08-20 LAB — PROTIME-INR
INR: 1.84 — ABNORMAL HIGH (ref 0.00–1.49)
Prothrombin Time: 21.6 seconds — ABNORMAL HIGH (ref 11.6–15.2)

## 2011-08-20 MED ORDER — ALPRAZOLAM 0.5 MG PO TABS
1.0000 mg | ORAL_TABLET | Freq: Four times a day (QID) | ORAL | Status: DC
  Administered 2011-08-20 – 2011-08-28 (×32): 1 mg via ORAL
  Filled 2011-08-20 (×32): qty 2

## 2011-08-20 MED ORDER — ATORVASTATIN CALCIUM 20 MG PO TABS
20.0000 mg | ORAL_TABLET | Freq: Every day | ORAL | Status: DC
  Administered 2011-08-20 – 2011-08-27 (×8): 20 mg via ORAL
  Filled 2011-08-20 (×9): qty 1

## 2011-08-20 MED ORDER — ASPIRIN 81 MG PO CHEW
CHEWABLE_TABLET | ORAL | Status: AC
  Administered 2011-08-20: 81 mg
  Filled 2011-08-20: qty 1

## 2011-08-20 MED ORDER — ALPRAZOLAM 0.5 MG PO TABS: 1.0000 mg | ORAL_TABLET | Freq: Four times a day (QID) | ORAL | Status: DC

## 2011-08-20 MED ORDER — AMIODARONE HCL IN DEXTROSE 360-4.14 MG/200ML-% IV SOLN
0.5000 mg/min | INTRAVENOUS | Status: DC
  Administered 2011-08-21: 0.5 mg/min via INTRAVENOUS
  Filled 2011-08-20 (×6): qty 200

## 2011-08-20 MED ORDER — AMIODARONE HCL IN DEXTROSE 360-4.14 MG/200ML-% IV SOLN
1.0000 mg/min | INTRAVENOUS | Status: AC
  Administered 2011-08-20 (×2): 1 mg/min via INTRAVENOUS

## 2011-08-20 MED ORDER — FUROSEMIDE 40 MG PO TABS
40.0000 mg | ORAL_TABLET | Freq: Two times a day (BID) | ORAL | Status: DC
  Administered 2011-08-20 (×2): 40 mg via ORAL
  Filled 2011-08-20 (×2): qty 1

## 2011-08-20 MED ORDER — AMIODARONE HCL IN DEXTROSE 360-4.14 MG/200ML-% IV SOLN
INTRAVENOUS | Status: AC
  Filled 2011-08-20: qty 200

## 2011-08-20 MED ORDER — FUROSEMIDE 40 MG PO TABS: 40.0000 mg | ORAL_TABLET | Freq: Every day | ORAL | Status: DC

## 2011-08-20 MED ORDER — METOPROLOL TARTRATE 25 MG PO TABS
25.0000 mg | ORAL_TABLET | Freq: Two times a day (BID) | ORAL | Status: DC
  Administered 2011-08-20 – 2011-08-21 (×3): 25 mg via ORAL
  Filled 2011-08-20 (×4): qty 1

## 2011-08-20 MED ORDER — WARFARIN SODIUM 5 MG PO TABS
5.0000 mg | ORAL_TABLET | Freq: Once | ORAL | Status: AC
  Administered 2011-08-20: 5 mg via ORAL
  Filled 2011-08-20: qty 1

## 2011-08-20 MED ORDER — AMIODARONE LOAD VIA INFUSION
150.0000 mg | Freq: Once | INTRAVENOUS | Status: AC
  Administered 2011-08-20: 150 mg via INTRAVENOUS
  Filled 2011-08-20: qty 83.34

## 2011-08-20 NOTE — Progress Notes (Signed)
Physical Therapy Treatment Patient Details Name: [name] MRN: [id number] DOB: 1950/10/20 Today's Date: 08/20/2011 Time: 4098-1191 PT Time Calculation (min): 30 min  PT Assessment / Plan / Recommendation Comments on Treatment Session  Pt s/p AVR progressing with mobility but limited by cognition. Pt educated for transfers and sternal precautions but requires reinforcement with all mobility.    Follow Up Recommendations       Barriers to Discharge        Equipment Recommendations       Recommendations for Other Services    Frequency     Plan Discharge plan remains appropriate;Frequency remains appropriate    Precautions / Restrictions Precautions Precautions: Sternal;Fall   Pertinent Vitals/Pain No pain sats 94-98% on 4L with ambulation HR 90-104 with ambulation    Mobility  Bed Mobility Bed Mobility: Rolling Left;Left Sidelying to Sit;Sitting - Scoot to Edge of Bed Rolling Left: 5: Supervision Left Sidelying to Sit: 4: Min guard Sitting - Scoot to [place] of Bed: 4: Min assist Details for Bed Mobility Assistance: cueing for sequence and precautions to maintain sternal precautions Transfers Transfers: Sit to Stand;Stand to Sit Sit to Stand: 4: Min assist;From bed Stand to Sit: 4: Min guard Details for Transfer Assistance: cueing for hand placement and safety Ambulation/Gait Ambulation/Gait Assistance: 4: Min assist Ambulation Distance (Feet): 300 Feet Assistive device: Rolling walker Ambulation/Gait Assistance Details: cueing to step into RW and directional cues to room at least 5 periods of standing rest Gait velocity: decreased Stairs: No    Exercises     PT Diagnosis:    PT Problem List:   PT Treatment Interventions:     PT Goals Acute Rehab PT Goals PT Goal: Supine/Side to Sit - Progress: Progressing toward goal PT Goal: Sit to Stand - Progress: Progressing toward goal PT Goal: Stand to Sit - Progress: Progressing toward goal PT Transfer Goal: Bed to  Chair/Chair to Bed - Progress: Progressing toward goal PT Goal: Ambulate - Progress: Progressing toward goal  Visit Information  Last PT Received On: 08/20/11 Assistance Needed: +1    Subjective Data  Subjective: i just can't sleep   Cognition  Overall Cognitive Status: Impaired Area of Impairment: Memory Arousal/Alertness: Awake/alert Orientation Level: Disoriented to;Time Behavior During Session: WFL for tasks performed Current Attention Level: Sustained Memory: Decreased recall of precautions Memory Deficits: pt unable to recall any sternal precautions    Balance     End of Session PT - End of Session Equipment Utilized During Treatment: Gait belt Activity Tolerance: Patient tolerated treatment well Patient left: in chair;with call bell/phone within reach;Other (comment) Psychiatrist in room) Nurse Communication: Mobility status    Delorse Lek 08/20/2011, 5:24 PM Delaney Meigs, PT [phone]

## 2011-08-20 NOTE — Progress Notes (Signed)
TCTS BRIEF SICU PROGRESS NOTE  5 Days Post-Op  S/P Procedure(s) (LRB): AORTIC VALVE REPLACEMENT (AVR) (N/A)   Stable day  Plan: Continue current plan  Purcell Nails 08/20/2011 6:31 PM

## 2011-08-20 NOTE — Progress Notes (Signed)
5 Days Post-Op Procedure(s) (LRB): AORTIC VALVE REPLACEMENT (AVR) (N/A) Subjective: Agitated last night and didn't go to sleep until 0500 Mildly confused this AM  Objective: Vital signs in last 24 hours: Temp:  [97.7 F (36.5 C)-99.6 F (37.6 C)] 99.4 F (37.4 C) (05/21 0731) Pulse Rate:  [91-121] 94  (05/21 0300) Cardiac Rhythm:  [-] Normal sinus rhythm (05/21 0700) Resp:  [22-30] 25  (05/21 0700) BP: (70-143)/(33-83) 109/66 mmHg (05/21 0700) SpO2:  [90 %-100 %] 97 % (05/21 0729)  Hemodynamic parameters for last 24 hours:    Intake/Output from previous day: 05/20 0701 - 05/21 0700 In: 660 [P.O.:660] Out: 3425 [Urine:3425] Intake/Output this shift:    General appearance: distracted and no distress Neurologic: lethargic but no focal deficits Heart: regular rate and rhythm Lungs: diminished breath sounds bibasilar Abdomen: normal findings: soft, non-tender Wound: clean and dry  Lab Results:  Basename 08/20/11 0337 08/19/11 1050  WBC 11.8* 12.1*  HGB 8.3* 8.8*  HCT 25.0* 25.7*  PLT 181 145*   BMET:  Basename 08/20/11 0337 08/19/11 1050  NA 142 138  K 3.8 3.9  CL 103 101  CO2 28 25  GLUCOSE 68* 81  BUN 21 25*  CREATININE 1.02 1.12  CALCIUM 8.7 8.7    PT/INR:  Basename 08/20/11 0337  LABPROT 21.6*  INR 1.84*   ABG    Component Value Date/Time   PHART 7.316* 08/15/2011 1858   HCO3 23.7 08/15/2011 1858   TCO2 25 08/16/2011 1730   ACIDBASEDEF 2.0 08/15/2011 1858   O2SAT 92.0 08/15/2011 1858   CBG (last 3)   Basename 08/19/11 1136 08/19/11 0817 08/19/11 0728  GLUCAP 82 92 67*    Assessment/Plan: S/P Procedure(s) (LRB): AORTIC VALVE REPLACEMENT (AVR) (N/A) - Slow progress Confusion and agitation overnight. He's been on xanax 1 mg TID since surgery but was apparently taking 5 or more times a day PTA- will increase to QID, continue effexor  CV- stable. Continue coumadin for mechanical valve  RESP- pulmonary statusmuch improved with diuresis. He's very  negative over the last several days, will change lasix to PO  RENAL_ lytes, creatinine OK  Increase ambulation, physical activity   LOS: 5 days    Rocquel Askren C 08/20/2011

## 2011-08-21 ENCOUNTER — Inpatient Hospital Stay (HOSPITAL_COMMUNITY): Payer: PRIVATE HEALTH INSURANCE

## 2011-08-21 LAB — BASIC METABOLIC PANEL
BUN: 17 mg/dL (ref 6–23)
Chloride: 100 mEq/L (ref 96–112)
GFR calc Af Amer: >90 mL/min (ref >90)
GFR calc non Af Amer: 90 mL/min — ABNORMAL LOW (ref >90)
Potassium: 3.1 mEq/L — ABNORMAL LOW (ref 3.5–5.1)
Sodium: 138 mEq/L (ref 135–145)

## 2011-08-21 LAB — PROTIME-INR
INR: 2.77 — ABNORMAL HIGH (ref 0.00–1.49)
Prothrombin Time: 29.7 seconds — ABNORMAL HIGH (ref 11.6–15.2)

## 2011-08-21 MED ORDER — DEXTROSE 5 % IV SOLN
150.0000 mg | Freq: Once | INTRAVENOUS | Status: AC
  Administered 2011-08-22: 150 mg via INTRAVENOUS
  Filled 2011-08-21: qty 3

## 2011-08-21 MED ORDER — POLYETHYLENE GLYCOL 3350 17 GM/SCOOP PO POWD
17.0000 g | Freq: Every day | ORAL | Status: DC | PRN
  Filled 2011-08-21: qty 255

## 2011-08-21 MED ORDER — LEVALBUTEROL HCL 0.63 MG/3ML IN NEBU
0.6300 mg | INHALATION_SOLUTION | Freq: Three times a day (TID) | RESPIRATORY_TRACT | Status: DC | PRN
  Filled 2011-08-21: qty 3

## 2011-08-21 MED ORDER — FUROSEMIDE 40 MG PO TABS
40.0000 mg | ORAL_TABLET | Freq: Every day | ORAL | Status: DC
  Administered 2011-08-21 – 2011-08-24 (×4): 40 mg via ORAL
  Filled 2011-08-21 (×4): qty 1

## 2011-08-21 MED ORDER — SODIUM CHLORIDE 0.9 % IJ SOLN
3.0000 mL | INTRAMUSCULAR | Status: DC | PRN
  Administered 2011-08-21: 3 mL via INTRAVENOUS

## 2011-08-21 MED ORDER — ALUM & MAG HYDROXIDE-SIMETH 200-200-20 MG/5ML PO SUSP: 15.0000 mL | ORAL | Status: DC | PRN

## 2011-08-21 MED ORDER — METOPROLOL TARTRATE 25 MG PO TABS
25.0000 mg | ORAL_TABLET | Freq: Two times a day (BID) | ORAL | Status: DC
  Administered 2011-08-22: 25 mg via ORAL
  Filled 2011-08-21 (×3): qty 1

## 2011-08-21 MED ORDER — PROMETHAZINE HCL 25 MG PO TABS: 25.0000 mg | ORAL_TABLET | Freq: Four times a day (QID) | ORAL | Status: DC | PRN

## 2011-08-21 MED ORDER — POTASSIUM CHLORIDE CRYS ER 20 MEQ PO TBCR: 20.0000 meq | EXTENDED_RELEASE_TABLET | Freq: Two times a day (BID) | ORAL | Status: DC

## 2011-08-21 MED ORDER — SODIUM CHLORIDE 0.9 % IJ SOLN
3.0000 mL | Freq: Two times a day (BID) | INTRAMUSCULAR | Status: DC
  Administered 2011-08-22 – 2011-08-25 (×7): 3 mL via INTRAVENOUS

## 2011-08-21 MED ORDER — POTASSIUM CHLORIDE CRYS ER 20 MEQ PO TBCR
20.0000 meq | EXTENDED_RELEASE_TABLET | Freq: Two times a day (BID) | ORAL | Status: DC
  Filled 2011-08-21 (×2): qty 1

## 2011-08-21 MED ORDER — MOVING RIGHT ALONG BOOK
Freq: Once | Status: AC
  Administered 2011-08-21: 17:00:00
  Filled 2011-08-21: qty 1

## 2011-08-21 MED ORDER — POTASSIUM CHLORIDE CRYS ER 20 MEQ PO TBCR
40.0000 meq | EXTENDED_RELEASE_TABLET | Freq: Three times a day (TID) | ORAL | Status: AC
  Administered 2011-08-21 (×3): 40 meq via ORAL
  Filled 2011-08-21 (×2): qty 2

## 2011-08-21 MED ORDER — AMIODARONE HCL 200 MG PO TABS
400.0000 mg | ORAL_TABLET | Freq: Two times a day (BID) | ORAL | Status: DC
  Administered 2011-08-21 – 2011-08-28 (×14): 400 mg via ORAL
  Filled 2011-08-21 (×16): qty 2

## 2011-08-21 MED ORDER — MAGNESIUM HYDROXIDE 400 MG/5ML PO SUSP: 30.0000 mL | Freq: Every day | ORAL | Status: DC | PRN

## 2011-08-21 MED ORDER — TRAMADOL HCL 50 MG PO TABS
50.0000 mg | ORAL_TABLET | ORAL | Status: DC | PRN
  Administered 2011-08-22: 100 mg via ORAL
  Filled 2011-08-21: qty 2

## 2011-08-21 MED ORDER — SODIUM CHLORIDE 0.9 % IV SOLN: 250.0000 mL | INTRAVENOUS | Status: DC | PRN

## 2011-08-21 NOTE — Plan of Care (Signed)
Problem: Phase III Progression Outcomes Goal: Time patient transferred to PCTU/Telemetry POD Outcome: Completed/Met Date Met:  08/21/11 1545pm, pt arrived safely to 2017, via wheelchair. VS stable prior and during the transfer. Pt sister Bonita Quin made aware of transfer and given patient's new room number.  Pt personal belongings included a [place]-colored cell-phone without a charger, a shirt, underpant, and a pair of black crocs shoes. Pt settled in bed at 2017, sitter at bedside. Report given to Kathlene November, [place]. [name], Charity fundraiser.

## 2011-08-21 NOTE — Progress Notes (Signed)
CSW spoke with pt friend [name] and with pt daughter, continuing to work on developing a realistic and safe disposition for this pt. [name] states she will be able to provide care for pt during the day and pt cousin [name] able to provide care overnight. CSW left phone message with [name], need to confirm viability of this arrangement. Awaiting call back. [name] seems to feel that pt listens to her and will mobilize if she instructs him to do so.   CSW spoke with pt daughter to discuss this plan and she will also attempt to speak with [name]. Pt son to assist with care when he comes into town next week as well.   All in all, a difficult situation. SNF would be a good idea for this pt, but he is not agreeable and would likely check himself out if he did go. Will ask RNCM to arrange for home health RN and PT.   CSW will continue to follow.   Baxter Flattery, MSW [phone]

## 2011-08-21 NOTE — Progress Notes (Signed)
Pt HR got up to 140 but not sustaining, pt was in the bathroom straining. Once pt was back in bed, HR came down to the low 110's. VS stable will continue to monitor.

## 2011-08-21 NOTE — Progress Notes (Signed)
Pt HR went up into the 130's, not sustaining, VS stable, pt asymptomatic. HR back down into the 110's. Will continue to monitor.

## 2011-08-22 LAB — CBC
HCT: 27.9 % — ABNORMAL LOW (ref 39.0–52.0)
MCHC: 33.3 g/dL (ref 30.0–36.0)
Platelets: 299 10*3/uL (ref 150–400)
RDW: 15 % (ref 11.5–15.5)
WBC: 12 10*3/uL — ABNORMAL HIGH (ref 4.0–10.5)

## 2011-08-22 LAB — BASIC METABOLIC PANEL
BUN: 12 mg/dL (ref 6–23)
Chloride: 101 mEq/L (ref 96–112)
GFR calc Af Amer: >90 mL/min (ref >90)
GFR calc non Af Amer: >90 mL/min (ref >90)
Potassium: 3.7 mEq/L (ref 3.5–5.1)
Sodium: 137 mEq/L (ref 135–145)

## 2011-08-22 LAB — PROTIME-INR
INR: 3.43 — ABNORMAL HIGH (ref 0.00–1.49)
Prothrombin Time: 35.1 seconds — ABNORMAL HIGH (ref 11.6–15.2)

## 2011-08-22 MED ORDER — POTASSIUM CHLORIDE CRYS ER 20 MEQ PO TBCR
40.0000 meq | EXTENDED_RELEASE_TABLET | Freq: Two times a day (BID) | ORAL | Status: DC
  Administered 2011-08-22 – 2011-08-28 (×12): 40 meq via ORAL
  Filled 2011-08-22 (×13): qty 2

## 2011-08-22 MED ORDER — METOPROLOL TARTRATE 25 MG PO TABS
25.0000 mg | ORAL_TABLET | Freq: Three times a day (TID) | ORAL | Status: DC
  Administered 2011-08-22 – 2011-08-24 (×6): 25 mg via ORAL
  Filled 2011-08-22 (×9): qty 1

## 2011-08-22 NOTE — Consult Note (Signed)
Pt says he smokes 1/2 ppd down from 1 1/2 ppd. He is in action stage and is planning to quit. He says he plans to quit cold [place] and doesn't need any medical aids at this time but he is to call me and f/u with me if he does in the future. Referred to 1-800 quit now for f/u and support. Discussed oral fixation substitutes, second hand smoke and in home smoking policy. Reviewed and gave pt Written education/contact information.

## 2011-08-22 NOTE — Progress Notes (Signed)
Met with patient this morning and introduced myself to him- He states he plans to go home at d/c and does not want to pursue SNF. He states he has 3 options of places he can stay- with his daughter, with his brother in law or with his best friends daughter. RNCM advised of this plan and will assist with [facility] and DME arrangements. CSW will sign off at this time- please reconsult if CSW needs arise.  Reece Levy, MSW, Theresia Majors [phone]

## 2011-08-22 NOTE — Progress Notes (Addendum)
[address]            [place] [address]          [phone]     7 Days Post-Op Procedure(s) (LRB): AORTIC VALVE REPLACEMENT (AVR) (N/A)  Subjective: Drowsy, but arousable.  Rested poorly last night.  HRs up with activity.    Objective: Vital signs in last 24 hours: Patient Vitals for the past 24 hrs:  BP Temp Temp src Pulse Resp SpO2 Weight  08/22/11 0525 96/63 mmHg 98.4 F (36.9 C) Oral 110  24  92 % 199 lb 15.3 oz (90.7 kg)  08/22/11 0138 141/80 mmHg - - 122  27  91 % -  08/21/11 2156 137/85 mmHg 98.4 F (36.9 C) - 107  20  92 % -  08/21/11 1510 133/85 mmHg 98.4 F (36.9 C) - 110  20  93 % -  08/21/11 1500 130/76 mmHg - - 109  27  92 % -  08/21/11 1428 - - - - - 92 % -  08/21/11 1400 119/59 mmHg - - 112  23  100 % -  08/21/11 1300 114/66 mmHg - - 93  28  97 % -  08/21/11 1243 - 98.2 F (36.8 C) Oral - - - -  08/21/11 1200 105/62 mmHg - - 97  27  96 % -  08/21/11 1100 94/65 mmHg - - 87  25  94 % -  08/21/11 1000 117/73 mmHg - - 116  22  97 % -  08/21/11 0900 83/62 mmHg - - 61  21  99 % -   Current Weight  08/22/11 199 lb 15.3 oz (90.7 kg)  PRE-OPERATIVE WEIGHT: 92 kg    Intake/Output from previous day: 05/22 0701 - 05/23 0700 In: 863.5 [P.O.:780; I.V.:83.5] Out: 600 [Urine:600]    PHYSICAL EXAM:  Heart: Irr irr Lungs: clear Wound: clean and dry Extremities: trace LE edema   Lab Results: CBC: Basename 08/22/11 0447 08/20/11 0337  WBC 12.0* 11.8*  HGB 9.3* 8.3*  HCT 27.9* 25.0*  PLT 299 181   BMET:  Basename 08/22/11 0447 08/21/11 0340  NA 137 138  K 3.7 3.1*  CL 101 100  CO2 23 26  GLUCOSE 120* 90  BUN 12 17  CREATININE 0.92 0.94  CALCIUM 8.9 8.5    PT/INR:  Basename 08/22/11 0447  LABPROT 35.1*  INR 3.43*     Assessment/Plan: S/P Procedure(s) (LRB): AORTIC VALVE REPLACEMENT (AVR) (N/A) CV- AF, rates up with exertion. Received bolus IV Amio overnight. Currently, 100-110 on po Amio, Lopressor.   Beta blocker dose increased to 25 mg tid.  Watch HR and BPs, which have run 80-140 systolic.   Vol overload- Continue diuresis. Mechanical AVR- Coumadin bumped from 2.77-3.43.  Will hold coumadin tonight. Continue PT/CRPI.     LOS: 7 days    COLLINS,GINA H 08/22/2011  Patiet seen and examined. Agree with above. However, patient was alert when I saw him and says he is feeling much better. He did seem much more motivated today. Discussed d/c issues. He says he has plenty of help at home and does not want to go to SNF. I cautioned him that he would 24 hour care for 1-2 weeks and extensive assistance for another 2-3 weeks after that.  INR continues to rise - coumadin held yesterday, will hold again today, target 2.5- 3.5 so he's  within range we just need to find the appropriate dose to keep him there.

## 2011-08-22 NOTE — Progress Notes (Signed)
Pt. HR staying in 120's-130's. VS stable, MD notified, order given to give amiodarone bolus and lopressor PO, orders carried out, will continue to monitor.

## 2011-08-22 NOTE — Progress Notes (Signed)
This CSW handing off to [place], CSW for 2000 to address disposition. At this point, likely home with friend/family member, home health RN and PT.  Contacts are friend Marchelle Folks [phone] - she will provide care during the day) and daughter Babette Relic [phone]).  Other contact is cousin Annette Stable [phone]) who will provide care at night.   Pt did receive Level II PASSAR, if SNF is required, this is in place.   Baxter Flattery, MSW [phone]

## 2011-08-22 NOTE — Progress Notes (Signed)
CARDIAC REHAB PHASE I   PRE:  Rate/Rhythm: 95SR  BP:  Supine: 107/59  Sitting:   Standing:    SaO2: 95%RA  MODE:  Ambulation: 400 ft   POST:  Rate/Rhythem: 118-124 Afib  BP:  Supine: 91/41  Sitting:   Standing:    SaO2: 98%RA 1355-1442 Pt pleasant and willing to walk. Walked 400 ft on RA with rolling walker and asst x 1. Gait steady and slow. Took a couple of standing rest breaks. Back to bed after walk. HR elevated during walk and irregular with ?afib. Notified pt's RN.  With rest looks like lots of PACs with some sinus beats. Son walked with [place].  [name]

## 2011-08-23 LAB — BASIC METABOLIC PANEL
Calcium: 8.5 mg/dL (ref 8.4–10.5)
GFR calc Af Amer: >90 mL/min (ref >90)
GFR calc non Af Amer: 89 mL/min — ABNORMAL LOW (ref >90)
Glucose, Bld: 91 mg/dL (ref 70–99)
Potassium: 4 mEq/L (ref 3.5–5.1)
Sodium: 134 mEq/L — ABNORMAL LOW (ref 135–145)

## 2011-08-23 LAB — PROTIME-INR
INR: 3.82 — ABNORMAL HIGH (ref 0.00–1.49)
Prothrombin Time: 38.2 seconds — ABNORMAL HIGH (ref 11.6–15.2)

## 2011-08-23 NOTE — Care Management Note (Signed)
    Page 1 of 2   08/28/2011     2:42:48 PM   CARE MANAGEMENT NOTE 08/28/2011  Patient:  [name], [name]   Account Number:  [id number]  Date Initiated:  08/20/2011  Documentation initiated by:  Junius Creamer  Subjective/Objective Assessment:   aortic valve replacemnt     Action/Plan:   lives alone, pcp dr [name]   Anticipated DC Date:  08/24/2011   Anticipated DC Plan:  HOME W HOME HEALTH SERVICES  In-house referral  Clinical Social Worker      DC Associate Professor  CM consult      [facility] Choice  HOME HEALTH   Choice offered to / List presented to:  [name]        HH arranged  HH-1 RN      [facility] agency  [facility]   Status of service:  Completed, signed off Medicare Important Message given?   (If response is "NO", the following Medicare IM given date fields will be blank) Date Medicare IM given:   Date Additional Medicare IM given:    Discharge Disposition:  HOME W HOME HEALTH SERVICES  Per UR Regulation:  Reviewed for med. necessity/level of care/duration of stay  If discussed at Long Length of Stay Meetings, dates discussed:   08/21/2011    Comments:  08/28/11 Viriginia Amendola,RN,BSN 1100  [phone] PT'S INSURANCE NOW STATES THAT THEY WILL NOT APPROVE SNF STAY.  MET WITH PT AND CSW TO INFORM HIM OF THIS.  PT STATES HE IS FINE WITH THIS, THAT HE CAN PROVIDE 24HR CARE AT HOME.  WILL ARRANGE HHRN FOR RESTORATIVE CARE FOR DISCHARGE.  PT LIVES IN [place]  START OF CARE 24-48H POST DC DATE.  08/23/11  1152  CRYSTAL SIMMONS RN, BSN [phone] DISCUSSED DISCHARGE PLANNING WITH PT AT BEDSIDE; HE STATED THAT HIS PLAN IN TO DISCHARGE TO REHAB FACILITY IN [place]; PT LIVES ALONE AND DOESN'T HAVE 24HR SUPERVISION; HE STATED THAT REHAB WOULD BE HIS SAFER OPTION; CSW- JANET UPDATED. NCM WILL CONTINUE TO FOLLOW.   5/21 12:54p debbie dowell rn,bsn [phone] sw spoke w pt and sister. sister cannot provide care after disch. states a friend will help but sw unable to  reach friend. will cont to follow for dc planning and pt progresses.

## 2011-08-23 NOTE — Progress Notes (Signed)
Physical Therapy Treatment Patient Details Name: [name] MRN: [id number] DOB: 1950-12-25 Today's Date: 08/23/2011 Time: 1191-4782 PT Time Calculation (min): 27 min  PT Assessment / Plan / Recommendation Comments on Treatment Session  Pt s/p AVR with much improved mobility, safety awareness, and cognition today. Pt agreeable to SNF for supervision and mobility progression. Pt educated for all sternal precautions and encouraged to ambulate later today with nursing.    Follow Up Recommendations       Barriers to Discharge        Equipment Recommendations       Recommendations for Other Services    Frequency     Plan Discharge plan remains appropriate;Frequency remains appropriate    Precautions / Restrictions Precautions Precautions: Sternal   Pertinent Vitals/Pain No pain HR 85-95 with activity O2 98% on RA    Mobility  Bed Mobility Bed Mobility: Rolling Right;Right Sidelying to Sit;Sit to Sidelying Right Rolling Right: 6: Modified independent (Device/Increase time) Right Sidelying to Sit: 6: Modified independent (Device/Increase time);HOB flat Sit to Sidelying Right: 6: Modified independent (Device/Increase time);HOB flat Transfers Transfers: Sit to Stand;Stand to Sit;Stand Pivot Transfers Sit to Stand: 5: Supervision;From bed;From chair/3-in-1 Stand to Sit: 5: Supervision;To bed;To chair/3-in-1 Stand Pivot Transfers: 5: Supervision Details for Transfer Assistance: cueing for safety with hand placement only Ambulation/Gait Ambulation/Gait Assistance: 6: Modified independent (Device/Increase time) Ambulation Distance (Feet): 75 Feet Assistive device: None Ambulation/Gait Assistance Details: Pt ambulated laps in room without RW due to recent long hall ambulation with nursing and fatigued quickly after all transfers and HEP Gait Pattern: Within Functional Limits;Decreased stride length Stairs: No    Exercises General Exercises - Lower Extremity Long Arc Quad:  AROM;Both;Other reps (comment);Seated (25reps) Hip Flexion/Marching: AROM;Both;Other reps (comment);Seated (25 reps)   PT Diagnosis:    PT Problem List:   PT Treatment Interventions:     PT Goals Acute Rehab PT Goals PT Goal: Supine/Side to Sit - Progress: Met PT Goal: Sit to Stand - Progress: Progressing toward goal PT Goal: Stand to Sit - Progress: Progressing toward goal PT Transfer Goal: Bed to Chair/Chair to Bed - Progress: Progressing toward goal Pt will Ambulate: >150 feet;Independently PT Goal: Ambulate - Progress: Updated due to goal met PT Goal: Up/Down Stairs - Progress: Progressing toward goal Additional Goals PT Goal: Additional Goal #1 - Progress: Progressing toward goal  Visit Information  Last PT Received On: 08/23/11 Assistance Needed: +1    Subjective Data  Subjective: I believe I'll go to that rehab place in [place]   Cognition  Overall Cognitive Status: Appears within functional limits for tasks assessed/performed Arousal/Alertness: Awake/alert Orientation Level: Appears intact for tasks assessed Behavior During Session: [facility] for tasks performed Memory: Decreased recall of precautions Memory Deficits: Pt able to recall 2 sternal precautions    Balance     End of Session PT - End of Session Activity Tolerance: Patient tolerated treatment well Patient left: in chair;with call bell/phone within reach    Delorse Lek 08/23/2011, 1:46 PM Delaney Meigs, PT [phone]

## 2011-08-23 NOTE — Progress Notes (Signed)
Advised today that patient is now agreeable to SNF- wants to go to a SNF in the [place] area. Will ask weeekend CSW to begin [facility] and seek placment for this patient.  Reece Levy, MSW, [facility] Majors [phone]

## 2011-08-23 NOTE — Progress Notes (Addendum)
8 Days Post-Op Procedure(s) (LRB): AORTIC VALVE REPLACEMENT (AVR) (N/A) Subjective:  [name] complains of being sore this morning. Patient currently in A. Flutter this morning.    Objective: Vital signs in last 24 hours: Temp:  [99.2 F (37.3 C)-100 F (37.8 C)] 100 F (37.8 C) (05/24 0428) Pulse Rate:  [96-109] 96  (05/24 0428) Cardiac Rhythm:  [-] Atrial flutter;Other (Comment) (05/23 1930) Resp:  [16-21] 18  (05/24 0428) BP: (94-107)/(51-74) 95/51 mmHg (05/24 0428) SpO2:  [92 %-100 %] 92 % (05/24 0428) Weight:  [191 lb 12.8 oz (87 kg)] 191 lb 12.8 oz (87 kg) (05/24 0428)   Intake/Output from previous day: 05/23 0701 - 05/24 0700 In: 240 [P.O.:240] Out: 900 [Urine:900]  General appearance: alert, cooperative and no distress Heart: irregularly irregular rhythm Lungs: clear to auscultation bilaterally Abdomen: soft, non-tender; bowel sounds normal; no masses,  no organomegaly Extremities: edema trace Wound: clean and dry  Lab Results:  [facility] 08/22/11 0447  WBC 12.0*  HGB 9.3*  HCT 27.9*  PLT 299   BMET:  Basename 08/23/11 0527 08/22/11 0447  NA 134* 137  K 4.0 3.7  CL 101 101  CO2 22 23  GLUCOSE 91 120*  BUN 14 12  CREATININE 0.95 0.92  CALCIUM 8.5 8.9    PT/INR:  Basename 08/23/11 0527  LABPROT 38.2*  INR 3.82*   ABG    Component Value Date/Time   PHART 7.316* 08/15/2011 1858   HCO3 23.7 08/15/2011 1858   TCO2 25 08/16/2011 1730   ACIDBASEDEF 2.0 08/15/2011 1858   O2SAT 92.0 08/15/2011 1858   CBG (last 3)  No results found for this basename: GLUCAP:3 in the last 72 hours  Assessment/Plan: S/P Procedure(s) (LRB): AORTIC VALVE REPLACEMENT (AVR) (N/A)  2. CV- A. Flutter this morning, rates in the 110s, patient on PO Amiodarone and Metoprolol- will discuss with staff further management, ? Benefit from additional amiodarone bolus? 3. Resp- no acute issues- encourage IS 4. INR 3.82, on coumadin for mechanical valve anticoagulation, will hold coumadin  this evening.  Patient may benefit from alternating 2.5/5 mg regimen 5. Fluid Balance- patients weight is stable, will continue diuresis 6. Deconditioning- PT recommends SNF 7. Dispo- patient would benefit from SNF at discharge. I again explained the risks associated with the patient going home and the amount of help required.  I do not think he fully understands the amount of work involved.  I did manage to get him to consider SNF located in [place], [place].  Depending on patients decision would likely aim d/c for Sunday or early next week.  Will need to get heart rate better controlled and coumadin therapy regimen set.     LOS: 8 days    [name] 08/23/2011  He is currently in SR INR still slowly increasing - no coumadin last 2 days- continue to hold, goal INR 2.5- 3.5 He has talked to son who encouraged him to go to SNF in [place], he is now agreeable to go there

## 2011-08-23 NOTE — Progress Notes (Signed)
CARDIAC REHAB PHASE I   PRE:  Rate/Rhythm: 115 A fib before walk in hall 92 SR  BP:  Supine:   Sitting: 94/62  Standing:    SaO2: 96 RA  MODE:  Ambulation: 410 ft   POST:  Rate/Rhythem: 100 SR  BP:  Supine:   Sitting: 92/61  Standing:  1050-1145  SaO2: 93-95 RA On arrival pt ask to go to bathroom. Pt in afib HR 115- 140's with walk to bathroom. While pt in bathroom he went into SR in 90's.Assisted X 1 and used walker to ambulate. Gait steady with walker. Pt c/o of leg, back and hip pain with walking. VS stable after walk. Pt to recliner after walk with chair alarm on and call light in reach.  [name]

## 2011-08-24 LAB — BASIC METABOLIC PANEL
CO2: 19 mEq/L (ref 19–32)
Calcium: 8.4 mg/dL (ref 8.4–10.5)
Creatinine, Ser: 1.03 mg/dL (ref 0.50–1.35)
GFR calc non Af Amer: 78 mL/min — ABNORMAL LOW (ref >90)
Glucose, Bld: 86 mg/dL (ref 70–99)
Sodium: 131 mEq/L — ABNORMAL LOW (ref 135–145)

## 2011-08-24 LAB — CBC
MCH: 30.4 pg (ref 26.0–34.0)
MCV: 94.9 fL (ref 78.0–100.0)
Platelets: 65 10*3/uL — ABNORMAL LOW (ref 150–400)
RBC: 2.37 MIL/uL — ABNORMAL LOW (ref 4.22–5.81)
RDW: 15 % (ref 11.5–15.5)
WBC: 15.8 10*3/uL — ABNORMAL HIGH (ref 4.0–10.5)

## 2011-08-24 LAB — PROTIME-INR
INR: 2.21 — ABNORMAL HIGH (ref 0.00–1.49)
Prothrombin Time: 24.9 seconds — ABNORMAL HIGH (ref 11.6–15.2)

## 2011-08-24 MED ORDER — METOPROLOL TARTRATE 12.5 MG HALF TABLET
12.5000 mg | ORAL_TABLET | Freq: Three times a day (TID) | ORAL | Status: DC
  Filled 2011-08-24 (×4): qty 1

## 2011-08-24 MED ORDER — ONDANSETRON 4 MG PO TBDP
4.0000 mg | ORAL_TABLET | Freq: Three times a day (TID) | ORAL | Status: DC | PRN
  Administered 2011-08-24: 4 mg via ORAL
  Filled 2011-08-24: qty 1

## 2011-08-24 MED ORDER — FUROSEMIDE 20 MG PO TABS
20.0000 mg | ORAL_TABLET | Freq: Every day | ORAL | Status: DC
Start: 2011-08-25 — End: 2011-08-28
  Administered 2011-08-26 – 2011-08-28 (×3): 20 mg via ORAL
  Filled 2011-08-24 (×4): qty 1

## 2011-08-24 MED ORDER — WARFARIN SODIUM 1 MG PO TABS
1.0000 mg | ORAL_TABLET | Freq: Once | ORAL | Status: AC
  Administered 2011-08-24: 1 mg via ORAL
  Filled 2011-08-24: qty 1

## 2011-08-24 MED ORDER — ZOLPIDEM TARTRATE 5 MG PO TABS: 10.0000 mg | ORAL_TABLET | Freq: Every evening | ORAL | Status: DC | PRN

## 2011-08-24 MED ORDER — DEXTROSE-NACL 5-0.9 % IV SOLN
INTRAVENOUS | Status: DC
  Administered 2011-08-24 – 2011-08-26 (×2): via INTRAVENOUS

## 2011-08-24 NOTE — Evaluation (Signed)
Occupational Therapy Evaluation Patient Details Name: [name] MRN: [id number] DOB: 1950/10/07 Today's Date: 08/24/2011 Time: 1478-2956 OT Time Calculation (min): 24 min  OT Assessment / Plan / Recommendation Clinical Impression  This 61 year old man was admitted for aortic valve replacement.  He lives alone and needs cues for sternal precautions during ADLs and mobility.  He will benefit from continued OT to safely complete adls at a modified independent level.  Pt would benefit from STSNF.    OT Assessment  Patient needs continued OT Services    Follow Up Recommendations       Barriers to Discharge Decreased caregiver support    Equipment Recommendations       Recommendations for Other Services    Frequency  Min 2X/week    Precautions / Restrictions Precautions Precautions: Sternal Restrictions Weight Bearing Restrictions: No   Pertinent Vitals/Pain BP 85/53    ADL  Eating/Feeding: Simulated;Independent Where Assessed - Eating/Feeding: Chair Grooming: Simulated;Supervision/safety Where Assessed - Grooming: Unsupported sitting Upper Body Bathing: Simulated;Supervision/safety Where Assessed - Upper Body Bathing: Unsupported sitting Lower Body Bathing: Simulated;Supervision/safety Where Assessed - Lower Body Bathing: Supported sit to stand Upper Body Dressing: Simulated;Minimal assistance Where Assessed - Upper Body Dressing: Unsupported sitting Lower Body Dressing: Simulated;Performed;Supervision/safety (performed socks; can cross legs) Where Assessed - Lower Body Dressing: Sopported sit to stand Toilet Transfer: Simulated;Supervision/safety Toilet Transfer Method: Stand pivot (chair to bed) Toileting - Clothing Manipulation and Hygiene: Simulated;Supervision/safety Where Assessed - Glass blower/designer Manipulation and Hygiene: Standing Equipment Used: Rolling walker Transfers/Ambulation Related to ADLs: SPT only to simulate ADLs and transfer back to bed:  BP low but  pt asymptomatic ADL Comments: Min cues for sternal precautions for ADLs and sit to/from stand    OT Diagnosis: Generalized weakness  OT Problem List:   OT Treatment Interventions: Self-care/ADL training;DME and/or AE instruction;Therapeutic activities;Patient/family education   OT Goals Acute Rehab OT Goals OT Goal Formulation: With patient Time For Goal Achievement: 09/07/11 Potential to Achieve Goals: Good ADL Goals Pt Will Transfer to Toilet: with modified independence;3-in-1;Ambulation ADL Goal: Toilet Transfer - Progress: Goal set today Pt Will Perform Toileting - Clothing Manipulation: with modified independence;Standing (and hygiene) ADL Goal: Toileting - Clothing Manipulation - Progress: Goal set today Miscellaneous OT Goals Miscellaneous OT Goal #1: Pt will complete adls at mod I level including clothing retrieval with no cues for sternal precautions OT Goal: Miscellaneous Goal #1 - Progress: Goal set today  Visit Information  Last OT Received On: 08/24/11 Assistance Needed: +1    Subjective Data  Subjective: I'd like to go back to bed Patient Stated Goal: agreeable to STSNF in [place] area   Prior Functioning  Home Living Lives With: Alone Bathroom Shower/Tub: Engineer, manufacturing systems: Standard Prior Function Level of Independence: Independent Driving: Yes Communication Communication: No difficulties Dominant Hand: Right    Cognition  Overall Cognitive Status: Impaired (needs reinforcement with sternal precautions) Area of Impairment: Memory;Safety/judgement;Other (comment) (slow processing at times; didn't carry over sternal prec. ) Behavior During Session: [facility] for tasks performed Memory: Decreased recall of precautions    Extremity/Trunk Assessment Right Upper Extremity Assessment RUE ROM/Strength/Tone: [facility] for tasks assessed (sternal precautions) Left Upper Extremity Assessment LUE ROM/Strength/Tone: WFL for tasks assessed   Mobility Bed  Mobility Sit to Sidelying Right: 4: Min assist Transfers Sit to Stand: 5: Supervision;From chair/3-in-1 (pt rocks forward to gain momentum)   Exercise    Balance    End of Session OT - End of Session Activity Tolerance: Patient tolerated  treatment well Patient left: in bed;with call bell/phone within reach;with bed alarm set   [facility] 08/24/2011, 4:42 PM Marica Otter, OTR/L [phone] 08/24/2011

## 2011-08-24 NOTE — Progress Notes (Signed)
9 Days Post-Op Procedure(s) (LRB): AORTIC VALVE REPLACEMENT (AVR) (N/A) Subjective: Overall feels better, but Wright/o inability to sleep for several nights  Objective: Vital signs in last 24 hours: Temp:  [98.2 F (36.8 Wright)-98.8 F (37.1 Wright)] 98.8 F (37.1 Wright) (05/25 0504) Pulse Rate:  [85-95] 85  (05/25 0504) Cardiac Rhythm:  [-] Other (Comment);Normal sinus rhythm (05/24 1930) Resp:  [18-24] 24  (05/25 0504) BP: (81-106)/(53-70) 102/70 mmHg (05/25 0504) SpO2:  [92 %-98 %] 92 % (05/25 0504) Weight:  [193 lb 12.6 oz (87.9 kg)] 193 lb 12.6 oz (87.9 kg) (05/25 0504)  Hemodynamic parameters for last 24 hours:    Intake/Output from previous day: 05/24 0701 - 05/25 0700 In: 480 [P.O.:480] Out: 400 [Urine:400] Intake/Output this shift: Total I/O In: 240 [P.O.:240] Out: -   General appearance: alert and no distress Heart: regular rate and rhythm and good valve click Lungs: clear to auscultation bilaterally Wound: minimal drainage, sternum stable  Lab Results:  Basename 08/22/11 0447  WBC 12.0*  HGB 9.3*  HCT 27.9*  PLT 299   BMET:  Basename 08/23/11 0527 08/22/11 0447  NA 134* 137  K 4.0 3.7  CL 101 101  CO2 22 23  GLUCOSE 91 120*  BUN 14 12  CREATININE 0.95 0.92  CALCIUM 8.5 8.9    PT/INR:  Basename 08/24/11 0600  LABPROT 24.9*  INR 2.21*   ABG    Component Value Date/Time   PHART 7.316* 08/15/2011 1858   HCO3 23.7 08/15/2011 1858   TCO2 25 08/16/2011 1730   ACIDBASEDEF 2.0 08/15/2011 1858   O2SAT 92.0 08/15/2011 1858   CBG (last 3)  No results found for this basename: GLUCAP:3 in the last 72 hours  Assessment/Plan: S/P Procedure(s) (LRB): AORTIC VALVE REPLACEMENT (AVR) (N/A) - AVR, mechanical- INR down to 2.2, 1 mg coumadin today Maintaining SR on Amio and lopressor- will d/Wright EPW SNF arrangements in progress   LOS: 9 days    [name] 08/24/2011

## 2011-08-24 NOTE — Progress Notes (Signed)
Patient agreeable to SNF placement at this time-FL2 faxed to area SNF's for offers- will f/u on Monday for offers and for further planning- Reece Levy, MSW, [place] [phone]

## 2011-08-24 NOTE — Progress Notes (Signed)
CARDIAC REHAB PHASE I   PRE:  Rate/Rhythm: 79 SR  BP:  Supine:   Sitting: 84/50  Standing:    SaO2: 98 RA  MODE:  Ambulation: 350 ft   POST:  Rate/Rhythem: 90 SR  BP:  Supine:   Sitting: 84/56  Standing:    SaO2: 88 RA 1445-1520 Assisted X 1 and used walker to ambulate. Gait steady with walker. He states that he has had a bad day due to diarrhea and nausea. Only able to get him to walk 350 feet. BP lower today, no c/o of dizziness with walking. Pt to recliner after walk with call light in reach and chair alarm on.  Beatrix Fetters

## 2011-08-24 NOTE — Progress Notes (Signed)
Pt's bp was low and pt c/o "not feeling good". IV team working currently on getting IV access. PA/MD notified of pt's condition. New Orders given, IV fluids started, Labs ordered. Will hold Lopressor today as MD order and continue to monitor pt's vitals. Pacing wires still rolled and taped, orders to leave in until tomorrow. Will continue to monitor pt.

## 2011-08-25 ENCOUNTER — Inpatient Hospital Stay (HOSPITAL_COMMUNITY): Payer: PRIVATE HEALTH INSURANCE

## 2011-08-25 DIAGNOSIS — I359 Nonrheumatic aortic valve disorder, unspecified: Secondary | ICD-10-CM

## 2011-08-25 LAB — BASIC METABOLIC PANEL
BUN: 15 mg/dL (ref 6–23)
Calcium: 8.1 mg/dL — ABNORMAL LOW (ref 8.4–10.5)
GFR calc Af Amer: >90 mL/min (ref >90)
GFR calc non Af Amer: 79 mL/min — ABNORMAL LOW (ref >90)
Glucose, Bld: 98 mg/dL (ref 70–99)

## 2011-08-25 LAB — CBC
MCH: 31.2 pg (ref 26.0–34.0)
MCHC: 32.9 g/dL (ref 30.0–36.0)
Platelets: 360 10*3/uL (ref 150–400)
RDW: 14.9 % (ref 11.5–15.5)

## 2011-08-25 LAB — PROTIME-INR
INR: 1.88 — ABNORMAL HIGH (ref 0.00–1.49)
Prothrombin Time: 21.9 seconds — ABNORMAL HIGH (ref 11.6–15.2)

## 2011-08-25 LAB — CLOSTRIDIUM DIFFICILE BY PCR: Toxigenic C. Difficile by PCR: NEGATIVE

## 2011-08-25 LAB — PREPARE RBC (CROSSMATCH)

## 2011-08-25 LAB — GLUCOSE, CAPILLARY: Glucose-Capillary: 96 mg/dL (ref 70–99)

## 2011-08-25 LAB — OCCULT BLOOD X 1 CARD TO LAB, STOOL: Fecal Occult Bld: NEGATIVE

## 2011-08-25 MED ORDER — WARFARIN SODIUM 2 MG PO TABS
2.0000 mg | ORAL_TABLET | Freq: Once | ORAL | Status: AC
  Administered 2011-08-25: 2 mg via ORAL
  Filled 2011-08-25: qty 1

## 2011-08-25 MED ORDER — SODIUM CHLORIDE 0.9 % IJ SOLN
10.0000 mL | Freq: Two times a day (BID) | INTRAMUSCULAR | Status: DC
  Administered 2011-08-25 – 2011-08-26 (×2): 10 mL

## 2011-08-25 MED ORDER — ACETAMINOPHEN 325 MG PO TABS
650.0000 mg | ORAL_TABLET | Freq: Once | ORAL | Status: AC
  Administered 2011-08-25: 650 mg via ORAL
  Filled 2011-08-25: qty 2

## 2011-08-25 MED ORDER — SODIUM CHLORIDE 0.9 % IJ SOLN
10.0000 mL | INTRAMUSCULAR | Status: DC | PRN
  Administered 2011-08-27: 20 mL
  Administered 2011-08-27 (×2): 10 mL
  Administered 2011-08-28: 20 mL

## 2011-08-25 MED ORDER — COUMADIN BOOK
Freq: Once | Status: AC
  Administered 2011-08-25: 11:00:00
  Filled 2011-08-25: qty 1

## 2011-08-25 MED ORDER — METOPROLOL TARTRATE 12.5 MG HALF TABLET
12.5000 mg | ORAL_TABLET | Freq: Two times a day (BID) | ORAL | Status: DC
  Administered 2011-08-26 – 2011-08-28 (×5): 12.5 mg via ORAL
  Filled 2011-08-25 (×7): qty 1

## 2011-08-25 MED ORDER — WARFARIN VIDEO
Freq: Once | Status: AC
  Administered 2011-08-26: 18:00:00

## 2011-08-25 MED ORDER — PANTOPRAZOLE SODIUM 40 MG IV SOLR
40.0000 mg | INTRAVENOUS | Status: DC
  Administered 2011-08-25: 40 mg via INTRAVENOUS
  Filled 2011-08-25 (×2): qty 40

## 2011-08-25 NOTE — Progress Notes (Addendum)
Called by [employer] Nurse Marcelino Duster.  Mr. Kimoto is again developing hypotension.  They are currently running blood through his peripheral IV which is very uncomfortable for the patient.  He complains it is burning up his arm. The patient is also due for Lasix.    We have ordered a PICC line and are waiting for it to be placed.  We have spoken to the PICC team and they are on the way.  We will hold Lasix at this time.  We can order prn as needed.  We will stop blood transfusion at this time and resume with 2 units once PICC line placed.  Finally we will hold ASA and coumadin and Amiodarone.   BP 96/52, Sr at 82. He looks fine. He is not in extremis at all. Should feel better after transfusion. Echo pending

## 2011-08-25 NOTE — Progress Notes (Signed)
Report given to 2300 nurse Victorino Dike, rn. Pt to transfer to room 2308, 1st unit of prbc infusing now.

## 2011-08-25 NOTE — Progress Notes (Signed)
2-D echocardiogram reviewed, good LV function and good valvular function. Limited views of R side of heart-  Pt looks well now --  in ICU for monitoring and completion of 2 U PRBCs for Hb 6.2 this am  His BP since early am has been > 100 SAP in NSR 84 with good distal pulses, check labs and CXR in am.Stool guaiac by report negative.

## 2011-08-25 NOTE — Progress Notes (Signed)
  Echocardiogram 2D Echocardiogram has been performed.  [name] A 08/25/2011, 4:25 PM

## 2011-08-25 NOTE — Progress Notes (Signed)
Dr. Kate Sable  notified of today's 2D echo results, See report. MD instructed to proceed with blood transfusion and call him back when 1 st unit completed for further orders. Md given Dr. Tommi Rumps Gent's cell phone number per Dr. Rayna Sexton request. Will monitor vitals frequently and hemodynamic status. Charge nurse and [facility] notified and aware of patient's status

## 2011-08-25 NOTE — Progress Notes (Addendum)
10 Days Post-Op Procedure(s) (LRB): AORTIC VALVE REPLACEMENT (AVR) (N/A)  Subjective:  [name] does not feel well.  He states he just feels bad.  He also complains of discomfort along the lower portion of his sternotomy incision with movement of upper extremities.  Patient has not been observing sternal precautions and he was again educated on the importance of them.  Objective: Vital signs in last 24 hours: Temp:  [98.6 F (37 C)-99.5 F (37.5 C)] 99.5 F (37.5 C) (05/26 0859) Pulse Rate:  [76-91] 82  (05/26 0859) Cardiac Rhythm:  [-] Normal sinus rhythm (05/25 2100) Resp:  [18-20] 18  (05/26 0434) BP: (71-113)/(48-67) 92/58 mmHg (05/26 0900) SpO2:  [92 %-99 %] 99 % (05/26 0859) Weight:  [200 lb 9.6 oz (90.992 kg)] 200 lb 9.6 oz (90.992 kg) (05/26 0434)  Intake/Output from previous day: 05/25 0701 - 05/26 0700 In: 1080 [P.O.:1080] Out: 1300 [Urine:1300] Intake/Output this shift: Total I/O In: -  Out: 500 [Urine:500]  General appearance: alert,pale, appears ill Heart: regular rate and rhythm Lungs: clear to auscultation bilaterally Abdomen: soft, non-tender; bowel sounds normal; no masses,  no organomegaly Extremities: edema none appreciated Wound: clean and dry  Lab Results:  [facility] 08/25/11 0625 08/24/11 1945  WBC 12.5* 15.8*  HGB 6.8* 7.2*  HCT 20.7* 22.5*  PLT 360 65*   BMET:  Basename 08/25/11 0625 08/24/11 1945  NA 134* 131*  K 4.3 4.9  CL 103 101  CO2 21 19  GLUCOSE 98 86  BUN 15 18  CREATININE 1.01 1.03  CALCIUM 8.1* 8.4    PT/INR:  Basename 08/25/11 0625  LABPROT 21.9*  INR 1.88*   ABG    Component Value Date/Time   PHART 7.316* 08/15/2011 1858   HCO3 23.7 08/15/2011 1858   TCO2 25 08/16/2011 1730   ACIDBASEDEF 2.0 08/15/2011 1858   O2SAT 92.0 08/15/2011 1858   CBG (last 3)  No results found for this basename: GLUCAP:3 in the last 72 hours  Assessment/Plan: S/P Procedure(s) (LRB): AORTIC VALVE REPLACEMENT (AVR) (N/A)  2. CV_ NSR,  hypotensive this morning most likely due to volume depletion 3. Resp- no acute issues 4. Acute post operative anemia- Hgb on 5/23 was 9.3, CBC performed today was 6.8. Dr. Donata Clay aware ordered 2 units of PRBCs.  Concern for new source of bleeding especially with use of coumadin. 5. Diarrhea- patient with mutiple episodes yesterday- ordered C. Diff and occult blood checks to assess possibility for GI Bleed 6. INR 1.8- patient with mechanical valve in place, will discuss with staff about continuing coumadin or using heparin until source of drop in hemoglobin can be identified 7. Deconditioning-PT/OT recs SNF 8. Dispo- patient had been doing well.  He has developed an acute drop in Hgb.  We will give the blood and check stool for possible GI Bleed.   Discharge will need to be addressed when patient is more stable   LOS: 10 days    Lowella Dandy 08/25/2011

## 2011-08-25 NOTE — Progress Notes (Signed)
Pt c/o feeling a "popping sensation in his chest periodically. VS stable, MD aware. No new orders at this time. Will continue to monitor

## 2011-08-25 NOTE — Progress Notes (Signed)
Pt was weighted on standing scale this am.

## 2011-08-25 NOTE — Progress Notes (Signed)
Obtained blood vitals, fever noted. MD notified, orders given to administer 650 mg tylenol now and start blood administration 30 mins later. Tylenol given, see MAR. Blood returned to blood bank.

## 2011-08-26 ENCOUNTER — Inpatient Hospital Stay (HOSPITAL_COMMUNITY): Payer: PRIVATE HEALTH INSURANCE

## 2011-08-26 LAB — PROTIME-INR
INR: 1.99 — ABNORMAL HIGH (ref 0.00–1.49)
Prothrombin Time: 22.9 seconds — ABNORMAL HIGH (ref 11.6–15.2)

## 2011-08-26 LAB — BASIC METABOLIC PANEL
BUN: 12 mg/dL (ref 6–23)
Calcium: 8.4 mg/dL (ref 8.4–10.5)
Chloride: 104 mEq/L (ref 96–112)
Creatinine, Ser: 0.89 mg/dL (ref 0.50–1.35)
GFR calc Af Amer: >90 mL/min (ref >90)
GFR calc non Af Amer: >90 mL/min (ref >90)

## 2011-08-26 LAB — TYPE AND SCREEN
ABO/RH(D): A NEG
Antibody Screen: NEGATIVE
Unit division: 0
Unit division: 0
Unit division: 0

## 2011-08-26 LAB — CBC
HCT: 26.3 % — ABNORMAL LOW (ref 39.0–52.0)
MCH: 29.6 pg (ref 26.0–34.0)
MCHC: 33.1 g/dL (ref 30.0–36.0)
MCV: 89.5 fL (ref 78.0–100.0)
Platelets: 315 10*3/uL (ref 150–400)
RDW: 18.2 % — ABNORMAL HIGH (ref 11.5–15.5)
WBC: 12.1 10*3/uL — ABNORMAL HIGH (ref 4.0–10.5)

## 2011-08-26 MED ORDER — PANTOPRAZOLE SODIUM 40 MG PO TBEC
40.0000 mg | DELAYED_RELEASE_TABLET | Freq: Every day | ORAL | Status: DC
  Administered 2011-08-26 – 2011-08-28 (×3): 40 mg via ORAL
  Filled 2011-08-26 (×3): qty 1

## 2011-08-26 MED ORDER — WARFARIN SODIUM 2 MG PO TABS
2.0000 mg | ORAL_TABLET | Freq: Once | ORAL | Status: AC
  Administered 2011-08-26: 2 mg via ORAL
  Filled 2011-08-26: qty 1

## 2011-08-26 NOTE — Progress Notes (Signed)
Patient ID: [name], male   DOB: 01-18-1951, 61 y.o.   MRN: [id number] TCTS DAILY PROGRESS NOTE                   [address]            [place] [address]          [phone]      11 Days Post-Op Procedure(s) (LRB): AORTIC VALVE REPLACEMENT (AVR) (N/A)  Total Length of Stay:  LOS: 11 days   Subjective: Feels well, walked around unit 600 feet with out sob   Objective: Vital signs in last 24 hours: Temp:  [97.7 F (36.5 C)-100.9 F (38.3 C)] 97.7 F (36.5 C) (05/27 0802) Pulse Rate:  [75-96] 95  (05/27 0806) Cardiac Rhythm:  [-] Normal sinus rhythm (05/27 0800) Resp:  [18-29] 25  (05/27 0800) BP: (71-119)/(43-68) 108/59 mmHg (05/27 0800) SpO2:  [92 %-100 %] 95 % (05/27 0806) Weight:  [202 lb 13.2 oz (92 kg)] 202 lb 13.2 oz (92 kg) (05/26 2030)  Filed Weights   08/24/11 0504 08/25/11 0434 08/25/11 2030  Weight: 193 lb 12.6 oz (87.9 kg) 200 lb 9.6 oz (90.992 kg) 202 lb 13.2 oz (92 kg)    Weight change: 2 lb 3.6 oz (1.008 kg)   Hemodynamic parameters for last 24 hours:    Intake/Output from previous day: 05/26 0701 - 05/27 0700 In: 3842.5 [P.O.:480; I.V.:2575; Blood:787.5] Out: 2375 [Urine:2375]  Intake/Output this shift: Total I/O In: -  Out: 750 [Urine:750]  Current Meds: Scheduled Meds:   . acetaminophen  650 mg Oral Once  . ALPRAZolam  1 mg Oral QID  . amiodarone  400 mg Oral BID  . aspirin EC  81 mg Oral Daily  . atorvastatin  20 mg Oral QHS  . coumadin book   Does not apply Once  . furosemide  20 mg Oral Daily  . metoprolol tartrate  12.5 mg Oral BID  . pantoprazole (PROTONIX) IV  40 mg Intravenous Q24H  . potassium chloride  40 mEq Oral BID  . sodium chloride  10-40 mL Intracatheter Q12H  . venlafaxine XR  150 mg Oral BID  . warfarin  2 mg Oral ONCE-1800  . warfarin   Does not apply Once  . Warfarin - Physician Dosing Inpatient   Does not apply q1800  . DISCONTD: metoprolol tartrate  12.5 mg Oral TID  . DISCONTD:  pantoprazole  40 mg Oral Daily  . DISCONTD: sodium chloride  3 mL Intravenous Q12H   Continuous Infusions:   . dextrose 5 % and 0.9% NaCl 20 mL/hr at 08/26/11 0800   PRN Meds:.alum & mag hydroxide-simeth, levalbuterol, magnesium hydroxide, ondansetron (ZOFRAN) IV, ondansetron, oxyCODONE, promethazine, sodium chloride, zolpidem, DISCONTD: sodium chloride, DISCONTD: sodium chloride, DISCONTD: traMADol  General appearance: alert, cooperative and no distress Neurologic: intact Heart: regular rate and rhythm, S1, S2 normal, no murmur, click, rub or gallop and normal apical impulse Lungs: clear to auscultation bilaterally and normal percussion bilaterally Abdomen: soft, non-tender; bowel sounds normal; no masses,  no organomegaly Extremities: extremities normal, atraumatic, no cyanosis or edema Valve sounds crisp  Lab Results: CBC: Basename 08/26/11 0350 08/25/11 0625  WBC 12.1* 12.5*  HGB 8.7* 6.8*  HCT 26.3* 20.7*  PLT 315 360   BMET:  Basename 08/26/11 0350 08/25/11 0625  NA 135 134*  K 4.2 4.3  CL 104 103  CO2 25 21  GLUCOSE 99 98  BUN 12 15  CREATININE 0.89  1.01  CALCIUM 8.4 8.1*    PT/INR:  Basename 08/26/11 0350  LABPROT 22.9*  INR 1.99*   Radiology: Dg Chest 2 View  08/25/2011  *RADIOLOGY REPORT*  Clinical Data: Aortic valve replacement chest soreness  CHEST - 2 VIEW  Comparison: 08/21/2011  Findings: Stable cardiomegaly.  Median sternotomy for aortic valve replacement.  Small posteriorly layering bilateral pleural effusions.  Pulmonary vascularity is within normal limits.  There is mild bibasilar atelectasis.  No evidence of pulmonary edema.  IMPRESSION: Small bilateral pleural effusions and bibasilar atelectasis. Stable cardiomegaly, status post AVR.  Original Report Authenticated By: Britta Mccreedy, M.D.   Dg Chest Port 1 View  08/26/2011  *RADIOLOGY REPORT*  Clinical Data: Aortic valve replacement  PORTABLE CHEST - 1 VIEW  Comparison: Yesterday  Findings: Moderate  cardiomegaly.  Bibasilar hazy airspace disease and bilateral effusions are worse.  No pneumothorax.  Vascular congestion worse.  IMPRESSION: Worsening vascular congestion, bilateral hazy airspace disease and bilateral effusions.  These findings are most compatible with worsening volume overload.  Original Report Authenticated By: Donavan Burnet, M.D.     Assessment/Plan: S/P Procedure(s) (LRB): AORTIC VALVE REPLACEMENT (AVR) (N/A) Mobilize Plan for transfer to step-down: see transfer orders D/C pacing wires before pt higher No clinical evidence of Tamponade Dr PVT reviewed echo last pm Stable now HCT back up  [name] MD  Beeper [phone] Office [phone] 08/26/2011 8:51 AM

## 2011-08-26 NOTE — Progress Notes (Signed)
Pt transferred by ambulating to room 2018.  VS remain stable and there were no complications.  Report given to receiving RN and patient connected to telemetry.  Patient tolerated well.

## 2011-08-26 NOTE — Progress Notes (Signed)
Physical Therapy Treatment Patient Details Name: [name] MRN: [id number] DOB: 01/29/1951 Today's Date: 08/26/2011 Time: 4098-1191 PT Time Calculation (min): 31 min  PT Assessment / Plan / Recommendation Comments on Treatment Session  Pt s/p AVR with anemia yesterday progressing well with mobility without RW but continues to need supervision to maintain sternal precautions with mobility.     Follow Up Recommendations       Barriers to Discharge        Equipment Recommendations       Recommendations for Other Services    Frequency     Plan Discharge plan remains appropriate;Frequency remains appropriate    Precautions / Restrictions Precautions Precautions: Sternal   Pertinent Vitals/Pain Vitals stable throughout HR 87-96 O2 92-100 on RA throughout No pain    Mobility  Bed Mobility Bed Mobility: Rolling Left;Left Sidelying to Sit;Sitting - Scoot to Edge of Bed Rolling Left: 5: Supervision Left Sidelying to Sit: 4: Min assist Sitting - Scoot to Edge of Bed: 5: Supervision Details for Bed Mobility Assistance: cueing for sequence to maintain sternal precautions Transfers Transfers: Sit to Stand;Stand to Sit Sit to Stand: 5: Supervision;From bed Stand to Sit: 5: Supervision;To bed;To chair/3-in-1 Details for Transfer Assistance: cueing for hand placement x 2 trials Ambulation/Gait Ambulation/Gait Assistance: 5: Supervision Ambulation Distance (Feet): 600 Feet Assistive device: None Ambulation/Gait Assistance Details: decreased speed and arm swing but no LOB without RW today Gait Pattern: Decreased stride length;Decreased trunk rotation Gait velocity: 19ft/18sec=1.67 placing pt at increased risk for falls    Exercises General Exercises - Lower Extremity Long Arc Quad: AROM;Both;20 reps;Seated Hip Flexion/Marching: AROM;Both;20 reps;Seated   PT Diagnosis:    PT Problem List:   PT Treatment Interventions:     PT Goals Acute Rehab PT Goals PT Goal: Supine/Side to  Sit - Progress: Progressing toward goal PT Goal: Sit to Stand - Progress: Progressing toward goal PT Goal: Stand to Sit - Progress: Progressing toward goal PT Transfer Goal: Bed to Chair/Chair to Bed - Progress: Progressing toward goal PT Goal: Ambulate - Progress: Progressing toward goal PT Goal: Up/Down Stairs - Progress: Progressing toward goal Additional Goals PT Goal: Additional Goal #1 - Progress: Progressing toward goal  Visit Information  Last PT Received On: 08/26/11 Assistance Needed: +1    Subjective Data  Subjective: I just haven't slept well   Cognition  Overall Cognitive Status: Impaired Area of Impairment: Memory Arousal/Alertness: Awake/alert Orientation Level: Appears intact for tasks assessed Behavior During Session: [facility] for tasks performed Memory: Decreased recall of precautions Memory Deficits: Pt able to recall 2 sternal precautions, educated for all with handout provided    Balance     End of Session PT - End of Session Equipment Utilized During Treatment: Gait belt Activity Tolerance: Patient tolerated treatment well Patient left: in chair;with call bell/phone within reach Nurse Communication: Mobility status    Delorse Lek 08/26/2011, 8:11 AM Delaney Meigs, PT [phone]

## 2011-08-26 NOTE — Progress Notes (Signed)
Noted patient trasnferred to unit 2300 and orders for step down at this time. Patient has been faxed out to are SNF's for options at d/c. CSW to follow up to give offers and have patient/family select prior to d/c. Reece Levy, MSW, Theresia Majors [phone]

## 2011-08-26 NOTE — Progress Notes (Signed)
Occupational Therapy Treatment Patient Details Name: [name] MRN: [id number] DOB: 1950/12/23 Today's Date: 08/26/2011 Time: 4098-1191 OT Time Calculation (min): 15 min  OT Assessment / Plan / Recommendation Comments on Treatment Session Pt s/p AVR.Pt doing very well with mobility and ADL performance. No SOB noted. Pt lives alone. Con't to recommend st snf at d/c.    Follow Up Recommendations  Skilled nursing facility    Barriers to Discharge       Equipment Recommendations  Defer to next venue    Recommendations for Other Services    Frequency Min 2X/week   Plan Discharge plan remains appropriate    Precautions / Restrictions Precautions Precautions: Sternal   Pertinent Vitals/Pain     ADL  Grooming: Performed;Wash/dry hands;Denture care;Supervision/safety Where Assessed - Grooming: Unsupported standing Toilet Transfer: Buyer, retail Method: Sit to stand Transfers/Ambulation Related to ADLs: Pt ambulated to the sink with supervision. Pt also organized his belongings in his bags and wiped down the bathroom counter with supervision. ADL Comments: Pt was able to verbalize 3 sternal precautions.    OT Diagnosis:    OT Problem List:   OT Treatment Interventions:     OT Goals ADL Goals ADL Goal: Toilet Transfer - Progress: Progressing toward goals Miscellaneous OT Goals OT Goal: Miscellaneous Goal #1 - Progress: Progressing toward goals  Visit Information  Last OT Received On: 08/26/11 Assistance Needed: +1    Subjective Data  Subjective: Lavenia Atlas been on disability for 10 years now.   Prior Functioning       Cognition  Overall Cognitive Status: Impaired Area of Impairment: Memory Arousal/Alertness: Awake/alert Orientation Level: Appears intact for tasks assessed Behavior During Session: [facility] for tasks performed Memory: Decreased recall of precautions    Mobility Bed Mobility Rolling Left: 5: Supervision Left Sidelying to Sit: 4:  Min guard;HOB flat Details for Bed Mobility Assistance: min VCs to maintain sternal precautions. Transfers Sit to Stand: 5: Supervision;From bed Details for Transfer Assistance: Pt uses momentum to stand. 1 VC for hand placement and technique.   Exercises    Balance Balance Balance Assessed: Yes Static Sitting Balance Static Sitting - Balance Support: No upper extremity supported;Feet supported Static Sitting - Level of Assistance: 6: Modified independent (Device/Increase time) Static Standing Balance Static Standing - Balance Support: No upper extremity supported;During functional activity Static Standing - Level of Assistance: 5: Stand by assistance  End of Session OT - End of Session Activity Tolerance: Patient tolerated treatment well Patient left: in bed;with call bell/phone within reach   Javione Gunawan A OTR/L [phone] 08/26/2011, 2:48 PM

## 2011-08-27 LAB — PROTIME-INR
INR: 2.21 — ABNORMAL HIGH (ref 0.00–1.49)
Prothrombin Time: 24.9 seconds — ABNORMAL HIGH (ref 11.6–15.2)

## 2011-08-27 MED ORDER — ALPRAZOLAM 1 MG PO TABS: 1.0000 mg | ORAL_TABLET | Freq: Four times a day (QID) | ORAL | Status: DC

## 2011-08-27 MED ORDER — AMIODARONE HCL 200 MG PO TABS: 200.0000 mg | ORAL_TABLET | Freq: Two times a day (BID) | ORAL | Status: DC

## 2011-08-27 MED ORDER — WARFARIN SODIUM 1 MG PO TABS: 1.0000 mg | ORAL_TABLET | Freq: Once | ORAL | Status: DC

## 2011-08-27 MED ORDER — WARFARIN SODIUM 1 MG PO TABS
1.0000 mg | ORAL_TABLET | Freq: Once | ORAL | Status: AC
  Administered 2011-08-27: 1 mg via ORAL
  Filled 2011-08-27: qty 1

## 2011-08-27 MED ORDER — METOPROLOL TARTRATE 12.5 MG HALF TABLET: 12.5000 mg | ORAL_TABLET | Freq: Two times a day (BID) | ORAL | Status: DC

## 2011-08-27 MED ORDER — POTASSIUM CHLORIDE CRYS ER 20 MEQ PO TBCR: 20.0000 meq | EXTENDED_RELEASE_TABLET | Freq: Every day | ORAL | Status: DC

## 2011-08-27 MED ORDER — FUROSEMIDE 20 MG PO TABS: 20.0000 mg | ORAL_TABLET | Freq: Every day | ORAL | Status: DC

## 2011-08-27 MED ORDER — OXYCODONE HCL 5 MG PO TABS: 5.0000 mg | ORAL_TABLET | Freq: Four times a day (QID) | ORAL | Status: AC | PRN

## 2011-08-27 NOTE — Progress Notes (Signed)
12 Days Post-Op Procedure(s) (LRB): AORTIC VALVE REPLACEMENT (AVR) (N/A)  Subjective:  Mr. Talent has no complaints this morning.  He states he is feeling much better and has been able to get some sleep.  Objective: Vital signs in last 24 hours: Temp:  [98.6 F (37 C)-99.2 F (37.3 C)] 99.2 F (37.3 C) (05/28 0439) Pulse Rate:  [72-87] 82  (05/28 0439) Cardiac Rhythm:  [-] Normal sinus rhythm (05/28 0800) Resp:  [18-24] 19  (05/28 0439) BP: (89-115)/(55-68) 100/68 mmHg (05/28 0439) SpO2:  [94 %-100 %] 96 % (05/28 0439) Weight:  [196 lb 10.4 oz (89.2 kg)] 196 lb 10.4 oz (89.2 kg) (05/28 0439)  Intake/Output from previous day: 05/27 0701 - 05/28 0700 In: 1580 [P.O.:1440; I.V.:140] Out: 2510 [Urine:2510]    General appearance: alert, cooperative and no distress Heart: regular rate and rhythm Lungs: clear to auscultation bilaterally Abdomen: soft, non-tender; bowel sounds normal; no masses,  no organomegaly Extremities: edema trace Wound: clean and dry  Lab Results:  [facility] 08/26/11 0350 08/25/11 0625  WBC 12.1* 12.5*  HGB 8.7* 6.8*  HCT 26.3* 20.7*  PLT 315 360   BMET:  Basename 08/26/11 0350 08/25/11 0625  NA 135 134*  K 4.2 4.3  CL 104 103  CO2 25 21  GLUCOSE 99 98  BUN 12 15  CREATININE 0.89 1.01  CALCIUM 8.4 8.1*    PT/INR:  Basename 08/27/11 0515  LABPROT 24.9*  INR 2.21*   ABG    Component Value Date/Time   PHART 7.316* 08/15/2011 1858   HCO3 23.7 08/15/2011 1858   TCO2 25 08/16/2011 1730   ACIDBASEDEF 2.0 08/15/2011 1858   O2SAT 92.0 08/15/2011 1858   CBG (last 3)   Basename 08/24/11 2057  GLUCAP 96    Assessment/Plan: S/P Procedure(s) (LRB): AORTIC VALVE REPLACEMENT (AVR) (N/A)  2. CV- NSR rate and pressure controlled, will continue amiodarone and metoprolol 3. Acute Post operative anemia- improved, will repeat H/H in AM 4. INR 2.21-will give 1 mg of coumadin tonight 5. Deconditioning- cont PT/OT, recs SNF 6. Dispo- patient doing better,   Will recheck H/H in AM, if stable patient may be ready for d/c to SNF in the next 24-48 hours   LOS: 12 days    Raford Pitcher, Jehu Mccauslin 08/27/2011

## 2011-08-27 NOTE — Progress Notes (Signed)
UR Completed.  [name] [phone] 08/27/2011  

## 2011-08-27 NOTE — Progress Notes (Signed)
CARDIAC REHAB PHASE I   PRE:  Rate/Rhythm: 91SR  BP:  Supine:   Sitting:   Standing: 99/58   SaO2: 95%RA  MODE:  Ambulation: 650 ft   POST:  Rate/Rhythem: 96SR  BP:  Supine:   Sitting: 102/63  Standing:    SaO2: 96%RA 1019-1050 Pt walked 650 ft on RA with handheld asst with steady gait. Tolerated well. Denied dizziness. To recliner after walk with call bell.  Duanne Limerick

## 2011-08-27 NOTE — Progress Notes (Signed)
Physical Therapy Treatment Patient Details Name: [name] MRN: [id number] DOB: 1950-04-06 Today's Date: 08/27/2011 Time: 1914-7829 PT Time Calculation (min): 28 min  PT Assessment / Plan / Recommendation Comments on Treatment Session  Pt s/p AVR with excellent mobility today and end of session able to state all sternal precautions. Needs cueing for stand to sit but otherwise performed well and no fatigue with decreased gait speed but slowly improving.    Follow Up Recommendations       Barriers to Discharge        Equipment Recommendations       Recommendations for Other Services    Frequency     Plan Discharge plan remains appropriate    Precautions / Restrictions Precautions Precautions: Sternal   Pertinent Vitals/Pain No pain Vitals stable    Mobility  Transfers Transfers: Sit to Stand;Stand to Sit Sit to Stand: 6: Modified independent (Device/Increase time) Stand to Sit: 5: Supervision Details for Transfer Assistance: cueing for awareness of hand placement with sitting Ambulation/Gait Ambulation/Gait Assistance: 7: Independent Ambulation Distance (Feet): 800 Feet Assistive device: None Ambulation/Gait Assistance Details: WFL Gait Pattern: Within Functional Limits Gait velocity: 58ft/16sec=1.875 Stairs: No    Exercises     PT Diagnosis:    PT Problem List:   PT Treatment Interventions:     PT Goals Acute Rehab PT Goals PT Goal: Sit to Stand - Progress: Met PT Goal: Stand to Sit - Progress: Progressing toward goal PT Goal: Ambulate - Progress: Met PT Goal: Up/Down Stairs - Progress: Progressing toward goal Additional Goals PT Goal: Additional Goal #1 - Progress: Partly met  Visit Information  Last PT Received On: 08/27/11 Assistance Needed: +1    Subjective Data  Subjective: I don't have anybody at home   Cognition  Overall Cognitive Status: Appears within functional limits for tasks assessed/performed Arousal/Alertness: Awake/alert Orientation  Level: Appears intact for tasks assessed Behavior During Session: [facility] for tasks performed    Balance     End of Session PT - End of Session Activity Tolerance: Patient tolerated treatment well Patient left: in chair;with call bell/phone within reach Nurse Communication: Mobility status    Delorse Lek 08/27/2011, 4:12 PM Delaney Meigs, PT [phone]

## 2011-08-27 NOTE — Discharge Instructions (Signed)
Aortic Valve Replacement Care After Read the instructions outlined below and refer to this sheet for the next few weeks. These discharge instructions provide you with general information on caring for yourself after you leave the hospital. Your surgeon may also give you specific instructions. While your treatment has been planned according to the most current medical practices available, unavoidable complications occasionally occur. If you have any problems or questions after discharge, please call your surgeon. AFTER THE PROCEDURE Full recovery from heart valve surgery can take several months. Warfarin, Questions and Answers Warfarin (Coumadin) is a prescription medication that delays normal blood clotting. This is called an "anticoagulant" or "blood thinner." These medications do not actually cause the blood to become less thick, only less likely to clot. Normally, when body tissues are cut or damaged, the blood clots in order to prevent blood loss. Some clots form when they are not supposed to and may travel through the bloodstream and become lodged in smaller blood vessels. Blockage of blood flow can cause serious problems including a stroke (when a blood vessel leading to a portion of the brain is blocked) or a heart attack (when a blood vessel leading to the heart is blocked). WHO SHOULD USE WARFARIN?  Warfarin is prescribed for individuals who have a risk for developing harmful blood clots. People at risk include individuals with a mechanical heart valve, individuals with the irregular heart rhythm called atrial fibrillation, and individuals with certain clotting disorders.   Warfarin is also used in individuals who have a history of developing harmful clots, including individuals who have had a stroke, heart attack, a clot which has traveled to the lung (pulmonary embolism), or a blood clot in the leg (deep venous thrombosis or DVT).   Warfarin may be used to prevent the growth of an existing  clot, such as a DVT.  HOW IS THE EFFECT OF WARFARIN MONITORED? The goal of warfarin therapy is to lessen the clotting tendency of blood, but not to prevent clotting completely. Therefore, the anticoagulation effect of warfarin must be carefully watched using laboratory blood tests.  The test most commonly used to measure the effects of warfarin is the prothrombin time ("protime", or PT).   The PT is also used to compute a value known as the International Normalized Ratio (INR).   The longer it takes the blood to clot, the higher the PT and INR. Your caregiver will tell you what your "target" INR range is. In most cases, the target range will be 2 to 3, although other ranges may be chosen. If, at any time, your INR is below the target range, there is a risk of clotting. If, on the other hand, your INR is above the target range, there is an increased risk of bleeding.  When warfarin is first prescribed, a higher "loading" dose may be given so that an effective blood level of the medication is reached quickly. The loading dose is then adjusted downward until a maintenance dose (a dose which is enough to keep the INR where it should be) is reached. Once you are on a stable maintenance dose, the PT and INR are watched less often, usually once every 2 to 4 weeks. The warfarin dose may be changed at times in response to a changing INR or to medical changes that call for an increase or decrease in warfarin therapy. It is important to keep all laboratory and caregiver follow-up appointments! Not keeping appointments could result in a chronic or permanent injury, pain, or  disability. WHAT ARE THE SIDE EFFECTS OF WARFARIN?  Excessive blood thinning can cause bleeding (hemorrhage) from any part of the body. Individuals on warfarin should report any falls or accidents, or any symptoms of bleeding or unusual bruising. Signs of unusual bleeding may include bleeding from the gums, blood in the urine, bloody or dark  stool, a nosebleed, coughing up blood, or vomiting blood. Because the risk of bleeding increases as the INR rises above the desired limit, the INR is closely watched during warfarin therapy. Adjustments are made as needed to keep the INR at an acceptable level (within the target range).   Other body systems can be affected by warfarin. In addition to any signs of bleeding, patients should report skin rash or irritation, unusual fever, continual nausea or stomach upset, severe pain in the joints or back, swelling or pain at an injection site, or symptoms of a stroke.  IS WARFARIN SAFE TO USE DURING PREGNANCY?  Warfarin is generally not advised during pregnancy, at least during the first trimester, due to an increased risk of birth defects. A woman who becomes pregnant or plans to become pregnant while on warfarin therapy should notify the caregiver immediately.   Although warfarin does not pass into breast milk, a woman who wishes to breastfeed while taking warfarin should also consult with her caregiver.  ARE THERE SPECIAL PRECAUTIONS TO FOLLOW WHILE TAKING WARFARIN? Follow the dosage schedule carefully. Warfarin should be taken exactly as directed. A missed or extra dose requires a call to the prescribing caregiver for advice. Do not change the dose of warfarin on your own to make up for missed or extra doses. It is very important to take warfarin as directed since bleeding or blood clots could result in chronic or permanent injury, pain, or disability. Warfarin tablets come in different strengths. Each tablet strength is usually a different color, with the amount of warfarin (in milligrams) clearly printed on the tablet. You should immediately report to the pharmacist or caregiver any prescription which is different than the previous one, to make sure that you are taking the correct dose. Avoid situations that cause bleeding. You may have a tendency to bleed more easily than usual while taking warfarin.  Some simple changes which can limit bleeding are:  Use a softer toothbrush.   Floss with waxed floss rather than unwaxed floss.   Shave with an electric razor rather than a blade.   Limit use of sharp objects.   Avoid potentially harmful activities (such as contact sports).  Medical conditions. Medical conditions which increase your clotting time include:  Diarrhea.   Worsening of heart failure.   Fever.   Worsening of liver function.  Alcohol use. Chronic use of alcohol affects the body's ability to handle warfarin. You should avoid alcohol or consume it only in very limited quantities. General alcohol intake guidelines are 1 drink for women and 2 drinks for men per day. (1 drink = 5 ounces of wine, 12 ounces of beer or 1 ounces of liquor). Other medications. A number of drugs can interact with warfarin. Some of the most common over-the-counter pain relievers (acetaminophen, aspirin, and nonsteroidal anti-inflammatory medications) make the anticoagulant effects of warfarin stronger. If these medicines are taken, your caregiver may need to adjust the dose of warfarin. Talk to your caregiver if you have problems or questions. Dietary considerations. Some foods and supplements may interfere with warfarin's effectiveness. Examples appear below. Once you are stabilized on a warfarin regimen, no major dietary changes  should be made without consulting your caregiver. Vitamin K. Foods high in vitamin K can cause warfarin to be less effective. You should avoid eating very large amounts of foods known to be high in vitamin K. The serving size for foods high in vitamin K are  cup cooked and 1 cup raw, unless otherwise noted, including:  Beef liver (3.5 ounces).   Garbanzo beans.   Kale.   Spinach.   Nettle greens.   Swiss chard.   Pork liver (3.5 ounces).   Broccoli.   Cabbage.   Watercress.   Soybeans.   Endive.   Green tea (made with  ounce dried tea).   Brussels  sprouts.   Cauliflower.   Parsley (1 Tablespoon).   Collard greens.   Seaweed (limit 2 sheets).   Turnip greens.   Soybean oil.   Green peas.  Eating large amounts of these foods may seriously reduce how well a dose of warfarin works. It is not necessary to avoid these foods, but do not change the amount of vitamin K-containing foods you currently eat. Changing to a diet low in foods containing vitamin K may lead to an excessive warfarin effect. Do not drink herbal teas containing coumarin while taking this medication. IS IT SAFE TO USE SUPPLEMENTS WHILE TAKING WARFARIN?  Vitamin E. Vitamin E may increase the anticoagulant effects of warfarin. You should consult your caregiver before adding or changing a dose of vitamin E or any other vitamin.   Check other supplements such as multivitamins and calcium supplements. These may contain vitamin K.  FOR MORE INFORMATION Your caregiver is the best resource for important information related to your particular case. Because every person is different, it is important that your situation is looked at by someone who knows you as a whole person and also knows the specifics of your condition. Document Released: 03/18/2005 Document Revised: 03/07/2011 Document Reviewed: 06/10/2006  [facility] Patient Information 2012 [place], [place].  Blood thinning (anticoagulation) treatment with warfarin is often prescribed for 6 weeks to 3 months after surgery for those with biological valves. It is prescribed for life for those with mechanical valves.   Recovery includes healing of the surgical incision. There is a gradual building of stamina and exercise abilities. An exercise program under the direction of a physical therapist may be recommended.   Once you have an artificial valve, your heart function and your life will return to normal. You usually feel better after surgery. Shortness of breath and fatigue should lessen. If your heart was already severely  damaged before your surgery, you may continue to have problems.   You can usually resume most of your normal activities. You will have to continue to monitor your condition. You need to watch out for blood clots and infections.   Artificial valves need to be replaced after a period of time. It is important that you see your caregiver regularly.   Some individuals with an aortic valve replacement need to take antibiotics before having dental work or other surgical procedures. This is called prophylactic antibiotic treatment. These drugs help to prevent infective endocarditis. Antibiotics are only recommended for individuals with the highest risk for developing infective endocarditis. Let your dentist and your caregiver know if you have a history of any of the following so that the necessary precautions can be taken:   A VSD.   A repaired VSD.   Endocarditis in the past.   An artificial (prosthetic) heart valve.  HOME CARE INSTRUCTIONS   Use  all medications as prescribed.   Take your temperature every morning for the first week after surgery. Record these.   Weigh yourself every morning for at least the first week after surgery and record.   Do not lift more than 10 pounds (4.5 kg) until your breastbone (sternum) has healed. Avoid all activities which would place strain on your incision.   You may shower as soon as directed by your caregiver after surgery. Pat incisions dry. Do not rub incisions with washcloth or towel.   Avoid driving for 4 to 6 weeks following surgery or as instructed.   Use your elastic stockings during the day. You should wear the stockings for at least 2 weeks after discharge or longer if your ankles are swollen. The stockings help blood flow and help reduce swelling in the legs. It is easiest to put the stockings on before you get out of bed in the morning. They should fit snugly.  Pain Control  If a prescription was given for a pain reliever, please follow your  doctor's directions.   If the pain is not relieved by your medicine, becomes worse, or you have difficulty breathing, call your surgeon.  Activity  Take frequent rest periods throughout the day.   Wait one week before returning to strenuous activities such as heavy lifting (more than 10 pounds), pushing or pulling.   Talk with your doctor about when you may return to work and your exercise routine.   Do not drive while taking prescription pain medication.  Nutrition  You may resume your normal diet.   Drink plenty of fluids (6-8 glasses a day).   Eat a well-balanced diet.   Call your caregiver for persistent nausea or vomiting.  Elimination Your normal bowel function should return. If constipation should occur, you may:  Take a mild laxative.   Add fruit and bran to your diet.   Drink more fluids.   Call your doctor if constipation is not relieved.  SEEK IMMEDIATE MEDICAL CARE IF:   You develop chest pain which is not coming from your surgical cut (incision).   You develop shortness of breath or have difficulty breathing.   You develop a temperature over 101 F (38.3 C).   You have a sudden weight gain. Let your caregiver know what the weight gain is.   You develop a rash.   You develop any reaction or side effects to medications given.   You have increased bleeding from wounds.   You see redness, swelling, or have increasing pain in wounds.   You have pus coming from your wound.   You develop lightheadedness or feel faint.  Document Released: 10/04/2004 Document Revised: 03/07/2011 Document Reviewed: 12/26/2004 [facility] Patient Information 2012 [place], [place].

## 2011-08-27 NOTE — Progress Notes (Signed)
CSW presented b/o to pt. Only offer at this time is [facility]. Pt reports not his first choice, but he will accept in order to stay in [place]. CSW also submitted ppw  [employer] for [place] [facility] stay. CSW will continue to follow for SNF. Dellie Burns, MSW, [place] [phone] (coverage)

## 2011-08-28 DIAGNOSIS — Z952 Presence of prosthetic heart valve: Secondary | ICD-10-CM

## 2011-08-28 LAB — PROTIME-INR
INR: 2.1 — ABNORMAL HIGH (ref 0.00–1.49)
Prothrombin Time: 23.9 seconds — ABNORMAL HIGH (ref 11.6–15.2)

## 2011-08-28 NOTE — Progress Notes (Signed)
[id number] Education completed with pt. Permission given to refer to [facility] Phase 2. Pt voiced understanding of ed. Notified Pharmacist on unit that pt states he has not received ed on Coumadin.  Briefly reviewed with pt dark green leafy vegs can interfere with efficacy of med. Pharmacist to see pt prior to d/c. Keiland Pickering DunlapRN

## 2011-08-28 NOTE — Discharge Summary (Signed)
                    [address]            [name] [address]          [phone]  Addendum   Patients progress is to the point that he can be discharged home with HHRN/PT. Will also have RN draw PT/INR with results to Dr Andee Lineman  Meds at D/C    Medication List  As of 08/28/2011 11:12 AM   STOP taking these medications         acetaminophen 500 MG tablet      HYDROcodone-acetaminophen 5-325 MG per tablet      nitroGLYCERIN 0.4 MG SL tablet      promethazine 25 MG tablet         TAKE these medications         ALPRAZolam 1 MG tablet   Commonly known as: XANAX   Take 1 tablet (1 mg total) by mouth 4 (four) times daily.      amiodarone 200 MG tablet   Commonly known as: PACERONE   Take 1 tablet (200 mg total) by mouth 2 (two) times daily.      aspirin EC 81 MG tablet   Take 81 mg by mouth daily.      furosemide 20 MG tablet   Commonly known as: LASIX   Take 1 tablet (20 mg total) by mouth daily.      metoprolol tartrate 12.5 mg Tabs   Commonly known as: LOPRESSOR   Take 0.5 tablets (12.5 mg total) by mouth 2 (two) times daily.      MIRALAX powder   Generic drug: polyethylene glycol powder   Take 17 g by mouth daily as needed. For constipation      oxyCODONE 5 MG immediate release tablet   Commonly known as: Oxy IR/ROXICODONE   Take 1-2 tablets (5-10 mg total) by mouth every 6 (six) hours as needed.      pantoprazole 40 MG tablet   Commonly known as: PROTONIX   Take 40 mg by mouth daily.      potassium chloride SA 20 MEQ tablet   Commonly known as: K-DUR,KLOR-CON   Take 1 tablet (20 mEq total) by mouth daily.      simvastatin 40 MG tablet   Commonly known as: ZOCOR   Take 40 mg by mouth at bedtime.      venlafaxine XR 150 MG 24 hr capsule   Commonly known as: EFFEXOR-XR   Take 150 mg by mouth 2 (two) times daily.      warfarin 1 MG tablet   Commonly known as: COUMADIN   Take 1 tablet (1 mg total) by mouth one time only at 6 PM. As  directed

## 2011-08-28 NOTE — Clinical Social Work Note (Signed)
Pt is ready for discharge today. CSW informed pt of his denial for SNF by insurance. Covering CSW contacted [employer] regarding home health service agency, who will then follow up with [facility] for further arrangements. RNCM is aware and is following. Pt is agreeable to discharge plan and has family and friends to assist in home care. CSW is signing off as no further needs identified. Please reconsult if a need arises prior to discharge.   Dede Query, MSW, [place] Majors [phone]

## 2011-08-28 NOTE — Progress Notes (Signed)
Pt DCing home with family and HHRN.  All instructions and prescriptions given and reviewed, all questions answered.

## 2011-08-28 NOTE — Progress Notes (Signed)
Occupational Therapy Treatment Patient Details Name: [name] MRN: [id number] DOB: January 02, 1951 Today's Date: 08/28/2011 Time: 7829-5621 OT Time Calculation (min): 19 min  OT Assessment / Plan / Recommendation Comments on Treatment Session Pt s/p AVR. Pt progressing well with mobility and ADLs. Con't to recommend st snf stay to improve activity tolerance and for higher level IADLs.    Follow Up Recommendations  Skilled nursing facility    Barriers to Discharge       Equipment Recommendations  Defer to next venue    Recommendations for Other Services    Frequency Min 2X/week   Plan Discharge plan remains appropriate    Precautions / Restrictions Precautions Precautions: Sternal Restrictions Weight Bearing Restrictions: No   Pertinent Vitals/Pain     ADL  Grooming: Performed;Wash/dry hands;Wash/dry face;Modified independent Where Assessed - Grooming: Unsupported standing Upper Body Bathing: Simulated;Supervision/safety Where Assessed - Upper Body Bathing: Unsupported standing Lower Body Bathing: Performed;Supervision/safety Where Assessed - Lower Body Bathing: Unsupported standing Upper Body Dressing: Performed;Supervision/safety Where Assessed - Upper Body Dressing: Unsupported standing Lower Body Dressing: Performed;Supervision/safety Where Assessed - Lower Body Dressing: Unsupported sitting Toilet Transfer: Simulated;Modified independent Toilet Transfer Method: Sit to stand Transfers/Ambulation Related to ADLs: Pt ambulated to the sink independently. Stated he felt mildly fatigued at end of session.    OT Diagnosis:    OT Problem List:   OT Treatment Interventions:     OT Goals ADL Goals ADL Goal: Toilet Transfer - Progress: Progressing toward goals Miscellaneous OT Goals OT Goal: Miscellaneous Goal #1 - Progress: Progressing toward goals  Visit Information  Last OT Received On: 08/28/11 Assistance Needed: +1    Subjective Data  Subjective: Im going to the  rehab today.   Prior Functioning       Cognition  Overall Cognitive Status: Appears within functional limits for tasks assessed/performed Area of Impairment: Memory Arousal/Alertness: Awake/alert Orientation Level: Appears intact for tasks assessed Behavior During Session: [facility] for tasks performed Current Attention Level: Selective Memory Deficits: Pt was able to recall all sternal precautions today but didnt recall getting his meds an hour ago.    Mobility Transfers Sit to Stand: 6: Modified independent (Device/Increase time) Stand to Sit: 6: Modified independent (Device/Increase time)   Exercises    Balance Static Standing Balance Static Standing - Balance Support: No upper extremity supported;During functional activity Static Standing - Level of Assistance: 6: Modified independent (Device/Increase time)  End of Session OT - End of Session Activity Tolerance: Patient tolerated treatment well Patient left: in chair;with call bell/phone within reach   Jimi Giza A OTR/L [phone] 08/28/2011, 9:07 AM

## 2011-08-28 NOTE — Progress Notes (Addendum)
13 Days Post-Op Procedure(s) (LRB): AORTIC VALVE REPLACEMENT (AVR) (N/A)  Subjective:  [name] feels much better today.  He denies chest pain and shortness of breath.    Objective: Vital signs in last 24 hours: Temp:  [98 F (36.7 C)-99 F (37.2 C)] 98.2 F (36.8 C) (05/29 0409) Pulse Rate:  [71-87] 81  (05/29 0409) Cardiac Rhythm:  [-] Heart block (05/28 2027) Resp:  [18] 18  (05/29 0409) BP: (90-102)/(59-62) 102/59 mmHg (05/29 0409) SpO2:  [95 %-97 %] 97 % (05/29 0409) Weight:  [195 lb 5.2 oz (88.6 kg)] 195 lb 5.2 oz (88.6 kg) (05/29 0409)  Intake/Output from previous day: 05/28 0701 - 05/29 0700 In: 1320 [P.O.:1320] Out: 1100 [Urine:1100] Intake/Output this shift: Total I/O In: -  Out: 300 [Urine:300]  General appearance: alert, cooperative and no distress Neurologic: intact Heart: regular rate and rhythm Lungs: clear to auscultation bilaterally Abdomen: soft, non-tender; bowel sounds normal; no masses,  no organomegaly Extremities: edema trace Wound: clean and dry  Lab Results:  Basename 08/28/11 0500 08/26/11 0350  WBC -- 12.1*  HGB 9.3* 8.7*  HCT 28.9* 26.3*  PLT -- 315   BMET:  Basename 08/26/11 0350  NA 135  K 4.2  CL 104  CO2 25  GLUCOSE 99  BUN 12  CREATININE 0.89  CALCIUM 8.4    PT/INR:  Basename 08/28/11 0500  LABPROT 23.9*  INR 2.10*   ABG    Component Value Date/Time   PHART 7.316* 08/15/2011 1858   HCO3 23.7 08/15/2011 1858   TCO2 25 08/16/2011 1730   ACIDBASEDEF 2.0 08/15/2011 1858   O2SAT 92.0 08/15/2011 1858   CBG (last 3)  No results found for this basename: GLUCAP:3 in the last 72 hours  Assessment/Plan: S/P Procedure(s) (LRB): AORTIC VALVE REPLACEMENT (AVR) (N/A)  2. CV- NSR rate and pressure controlled, will continue Amiodarone and Metoprolol 3. Acute post operative anemia stable- Hgb 9.3 4. INR 2.10- will give 2mg  of coumadin tonight 5. Deconditioning- PT/OT recommending SNF 6. Dispo- patient medically stable at this  time, will d/c to SNF today if bed available   LOS: 13 days    BARRETT, ERIN 08/28/2011   Patient seen and examined. Agree with above

## 2011-08-28 NOTE — Discharge Summary (Signed)
Physician Discharge Summary  Patient ID: [name] MRN: [id number] DOB/AGE: [birth year] 61 y.o.  Admit date: 08/15/2011 Discharge date: 08/28/2011  Admission Diagnoses:  Patient Active Problem List  Diagnoses  . HYPERLIPIDEMIA-MIXED  . DEPRESSION, MAJOR  . CONSTIPATION  . DYSPNEA  . CHEST PAIN, PLEURITIC  . Mitral regurgitation  . CAD (coronary artery disease)  . Aortic stenosis  . Major depressive disorder  . Fibromyalgia   Discharge Diagnoses:   Patient Active Problem List  Diagnoses  . HYPERLIPIDEMIA-MIXED  . DEPRESSION, MAJOR  . CONSTIPATION  . DYSPNEA  . CHEST PAIN, PLEURITIC  . Mitral regurgitation  . CAD (coronary artery disease)  . Aortic stenosis  . Major depressive disorder  . Fibromyalgia  . S/P AVR   Discharged Condition: good  History of Present Illness:   [name] is a 61 yo male with known history of a bicuspid Aortic Valve who is routinely followed by Dr. Earnestine Leys.  He had been doing well until early April he developed progressively worsening shortness of breath and chest pain.  The patient described the pain as a tightness or pressure with exertion.  The patient underwent echocardiogram which showed further progression of aortic stenosis with a mean gradient of 40 mmHg and a valve area of 0.7cm2.  The patient underwent cardiac catheterization of 07/31/2011 which showed some plague in the circumflex and RCA, but no significant CAD warranting bypass surgery.  Due to the progression of the patients aortic stenosis he was referred to [facility] for possible aortic valve placement. He was evaluated by Dr. Dorris Fetch on 08/07/2011 which reviewed the patients catheterization and echocardiogram.  He felt the patient would benefit from Aortic Valve Replacement for prolonged survival and relief of his symptoms.  The benefits and risks of the procedure were explained to the patient and he was willing to proceed with surgery.  The patient was also educated regarding the type of  valve prosthesis available and he wished to have a mechanical valve.  His surgery was scheduled for 08/15/2011.  Hospital Course:   Mr. [name] presented to [facility] on 08/15/2011 and was taken to the operating room  He underwent Aortic Valve Replacement utilizing a 25 mm St. Jude Mechanical valve prosthesis.  He tolerated the procedure well and was taken to the SICU in stable condition.  POD #0 the patient was extubated without difficulty.  POD #1 the patient had post operative blood loss anemia.  He was transfused PRBCs.  He was weaned off his neo drip as tolerated.  He was started on Coumadin for mechanical valve anticoagulation.  He remained AAI paced.  POD #2 the patient was evaluated by PT who felt the patient would benefit from discharge to a SNF.  The patients chest tubes were removed without difficulty.  POD #3 the patient's chest xray was free from pneumothorax.  The patient was volume overloaded and his Lasix was increased.  POD #4 patient with pulmonary edema, which was improving with use of diuresis.  POD #5 the patient developed mild confusion.  POD #6 the patient was doing better he was transferred to the floor in stable condition.  POD #7 the patient developed A. Fib overnight.  He was treated with Amiodarone.  POD #8 the patient was in A. Flutter. The patient was being treated with Amiodarone and Metoprolol.  He converted to NSR later that afternoon.  The patient was willing to be discharged to a SNF in [place].  POD #9 the patient had several  episodes of diarrhea.  C. Diff toxin was obtained which was negative.  He continued to state he did not feel well.  POD #10  The patients morning hemoglobin was 6.8.  He was hypotensive.  He was transfused with 2 units of PRBCs.  Occult blood checks were negative for blood.  A 2-D echo was obtained which was concerning for possible loculated fluid collection with possible tamponade.  These results were reviewed with staff and it did not feel to be of  concern as the patient was not exhibiting exam findings consistent with tamponade.  The patient was transferred to the ICU for further monitoring.  POD #11  The patients hemoglobin was 8.7 after transfusion.  His hypotension had improved.  His EPW were removed without difficulty.  He was transferred back to the step down unit in stable condition.  POD #13 the patient feels great.  His hemoglobin level is 9.3  His INR was stable at 2.10.  He will remain on coumadin at discharge and will need to have an INR level of 2.0-2.5.  He will need to have his INR checked daily until regimen becomes stabilized because he is very sensitive to coumadin.  He is medically stable at this time.  He will be discharged to SNF in [place] today.  He is to follow up with Dr. Dorris Fetch on 09/17/2011 at 10:00 AM.  The patient will need to have a chest xray completed one hour prior to his appointment with Dr. Dorris Fetch at [facility] located on the first floor of the [facility]  The patient will also need to follow up with his Cardiologist in 2 weeks.  Discharge Instructions:  No heavy lifting over 8 lbs for 6 weeks No driving for 4 weeks or if using narcotic pain medication Carb modified diet Please keep INR 2.0-2.5, and fax results to Dr. Tommi Rumps Gent's office   Disposition: SNF  Discharge Orders    Future Appointments: Provider: Department: Dept Phone: Center:   09/17/2011 11:00 AM Loreli Slot, MD [facility] [place] [phone] [facility]     Medication List  As of 08/28/2011  9:10 AM   STOP taking these medications         acetaminophen 500 MG tablet      HYDROcodone-acetaminophen 5-325 MG per tablet      nitroGLYCERIN 0.4 MG SL tablet      promethazine 25 MG tablet         TAKE these medications         ALPRAZolam 1 MG tablet   Commonly known as: XANAX   Take 1 tablet (1 mg total) by mouth 4 (four) times daily.      amiodarone 200 MG tablet   Commonly known as: PACERONE   Take 1 tablet  (200 mg total) by mouth 2 (two) times daily.      aspirin EC 81 MG tablet   Take 81 mg by mouth daily.      furosemide 20 MG tablet   Commonly known as: LASIX   Take 1 tablet (20 mg total) by mouth daily.      metoprolol tartrate 12.5 mg Tabs   Commonly known as: LOPRESSOR   Take 0.5 tablets (12.5 mg total) by mouth 2 (two) times daily.      MIRALAX powder   Generic drug: polyethylene glycol powder   Take 17 g by mouth daily as needed. For constipation      oxyCODONE 5 MG immediate release tablet   Commonly  known as: Oxy IR/ROXICODONE   Take 1-2 tablets (5-10 mg total) by mouth every 6 (six) hours as needed.      pantoprazole 40 MG tablet   Commonly known as: PROTONIX   Take 40 mg by mouth daily.      potassium chloride SA 20 MEQ tablet   Commonly known as: K-DUR,KLOR-CON   Take 1 tablet (20 mEq total) by mouth daily.      simvastatin 40 MG tablet   Commonly known as: ZOCOR   Take 40 mg by mouth at bedtime.      venlafaxine XR 150 MG 24 hr capsule   Commonly known as: EFFEXOR-XR   Take 150 mg by mouth 2 (two) times daily.      warfarin 1 MG tablet   Commonly known as: COUMADIN   Take 1 tablet (1 mg total) by mouth one time only at 6 PM. As directed           Follow-up Information    Follow up with Peyton Bottoms, MD. (2 weeks-please call office to arrange appointment. Additionally prothrombin time will need to be sent to his office from the nursing facility to adjust Coumadin dose.)    Contact information:   [address] [place] [place] [address] [phone]       Follow up with Loreli Slot, MD. (Please see Dr. Dorris Fetch on 09/17/2011 at 11 AM. Please obtain a chest x-ray [facility] at 10 AM. [facility] imaging is located in the same office complex.)    Contact information:   [address] [place] [address] [phone]          Signed: Lowella Dandy 08/28/2011, 9:10 AM

## 2011-08-30 ENCOUNTER — Ambulatory Visit: Payer: Self-pay | Admitting: *Deleted

## 2011-08-30 DIAGNOSIS — Z952 Presence of prosthetic heart valve: Secondary | ICD-10-CM

## 2011-08-30 DIAGNOSIS — Z7901 Long term (current) use of anticoagulants: Secondary | ICD-10-CM | POA: Insufficient documentation

## 2011-09-02 ENCOUNTER — Ambulatory Visit (INDEPENDENT_AMBULATORY_CARE_PROVIDER_SITE_OTHER): Payer: PRIVATE HEALTH INSURANCE | Admitting: *Deleted

## 2011-09-02 DIAGNOSIS — Z7901 Long term (current) use of anticoagulants: Secondary | ICD-10-CM

## 2011-09-02 DIAGNOSIS — Z952 Presence of prosthetic heart valve: Secondary | ICD-10-CM

## 2011-09-02 DIAGNOSIS — Z954 Presence of other heart-valve replacement: Secondary | ICD-10-CM

## 2011-09-02 LAB — POCT INR: INR: 1.4

## 2011-09-05 ENCOUNTER — Ambulatory Visit (INDEPENDENT_AMBULATORY_CARE_PROVIDER_SITE_OTHER): Payer: PRIVATE HEALTH INSURANCE | Admitting: *Deleted

## 2011-09-05 DIAGNOSIS — Z7901 Long term (current) use of anticoagulants: Secondary | ICD-10-CM

## 2011-09-05 DIAGNOSIS — Z952 Presence of prosthetic heart valve: Secondary | ICD-10-CM

## 2011-09-05 DIAGNOSIS — Z954 Presence of other heart-valve replacement: Secondary | ICD-10-CM

## 2011-09-05 LAB — POCT INR: INR: 1.5

## 2011-09-11 ENCOUNTER — Encounter (INDEPENDENT_AMBULATORY_CARE_PROVIDER_SITE_OTHER): Payer: PRIVATE HEALTH INSURANCE

## 2011-09-11 ENCOUNTER — Encounter: Payer: Self-pay | Admitting: Physician Assistant

## 2011-09-11 ENCOUNTER — Ambulatory Visit (INDEPENDENT_AMBULATORY_CARE_PROVIDER_SITE_OTHER): Payer: Medicare Other | Admitting: *Deleted

## 2011-09-11 ENCOUNTER — Ambulatory Visit (INDEPENDENT_AMBULATORY_CARE_PROVIDER_SITE_OTHER): Payer: PRIVATE HEALTH INSURANCE | Admitting: Physician Assistant

## 2011-09-11 VITALS — BP 95/60 | HR 65 | Ht 72.0 in | Wt 189.0 lb

## 2011-09-11 DIAGNOSIS — I2510 Atherosclerotic heart disease of native coronary artery without angina pectoris: Secondary | ICD-10-CM

## 2011-09-11 DIAGNOSIS — I359 Nonrheumatic aortic valve disorder, unspecified: Secondary | ICD-10-CM

## 2011-09-11 DIAGNOSIS — Z952 Presence of prosthetic heart valve: Secondary | ICD-10-CM

## 2011-09-11 DIAGNOSIS — Z954 Presence of other heart-valve replacement: Secondary | ICD-10-CM

## 2011-09-11 DIAGNOSIS — I4891 Unspecified atrial fibrillation: Secondary | ICD-10-CM | POA: Insufficient documentation

## 2011-09-11 DIAGNOSIS — Z7901 Long term (current) use of anticoagulants: Secondary | ICD-10-CM

## 2011-09-11 DIAGNOSIS — I319 Disease of pericardium, unspecified: Secondary | ICD-10-CM

## 2011-09-11 DIAGNOSIS — I350 Nonrheumatic aortic (valve) stenosis: Secondary | ICD-10-CM

## 2011-09-11 DIAGNOSIS — I313 Pericardial effusion (noninflammatory): Secondary | ICD-10-CM | POA: Insufficient documentation

## 2011-09-11 LAB — POCT INR: INR: 1.4

## 2011-09-11 MED ORDER — HYDROCODONE-ACETAMINOPHEN 5-325 MG PO TABS: 1.0000 | ORAL_TABLET | Freq: Three times a day (TID) | ORAL | Status: AC | PRN

## 2011-09-11 MED ORDER — AMIODARONE HCL 200 MG PO TABS: 200.0000 mg | ORAL_TABLET | Freq: Every day | ORAL | Status: DC

## 2011-09-11 MED ORDER — ENOXAPARIN SODIUM 100 MG/ML ~~LOC~~ SOLN: 90.0000 mg | Freq: Two times a day (BID) | SUBCUTANEOUS | Status: DC

## 2011-09-11 NOTE — Assessment & Plan Note (Signed)
We'll order followup 2-D echocardiogram to rule out residual pericardial effusion, as noted on may 26, by Dr. Andee Lineman. This was a postop study, and raised the question of possible hemorrhage and loculation. Clinically, however, the patient has remained hemodynamically stable, since surgery. LV function was normal.

## 2011-09-11 NOTE — Patient Instructions (Signed)
   Echo If the results of your test are normal or stable, you will receive a letter.  If they are abnormal, the nurse will contact you by phone. Decrease Amiodarone to 200mg  daily x 1 month, then stop  Hydrocodone/APAP 5/325mg  - may take one tab up to three times per day as needed  Follow up in  2 months

## 2011-09-11 NOTE — Assessment & Plan Note (Signed)
Maintaining NSR on amiodarone and low-dose Lopressor. Will decrease amiodarone to 200 mg daily (times one month), then discontinue.

## 2011-09-11 NOTE — Assessment & Plan Note (Signed)
Patient doing very well, following recent surgical repair of severe bicuspid AS with St. Jude mechanical valve. He is on chronic Coumadin anticoagulation, although he has been sub-therapeutic (less than 2.0), since discharge. He is also on low-dose ASA. Will confer with Dr. Dorris Fetch regarding whether or not Lovenox overlap would be appropriate, until the patient achieves therapeutic INR levels (2.0-2.5). Would also recommend that he be taken off ASA, once he achieves those levels. We'll have patient return in 2 months to resume followup with Dr. Andee Lineman.

## 2011-09-11 NOTE — Progress Notes (Addendum)
HPI: Patient presents for post hospital followup from [facility], status post St. Jude AVR, on May 16, by Dr. Charlett Lango. He was referred, by Dr. Andee Lineman, for elective cardiac catheterization on May 1st, which yielded nonobstructive CAD and severe AS (mean gradient 40 mm mercury; AVA 0.7 cm). There was no definite evidence of PHTN. Despite multiple attempts, the aortic valve could not be crossed, due to heavy calcification.  Postop course notable for development of PAF, and patient was treated with amiodarone. He converted to NSR prior to discharge. He was started on Coumadin anticoagulation, which is followed here in our clinic, with INR goal 2.0-2.5.  Of note, a postop 2-D echo on May 26, reviewed by Dr. Andee Lineman, raised the question of possible hemorrhagic pericardial effusion, with possible loculation and tamponade physiology; however, clinically the patient did not demonstrate these findings, and he was not referred for an urgent CT scan, as had been recommended by Dr. Andee Lineman. LV function was preserved (EF 50-55%), with normal functioning prosthetic aortic valve.  Clinically, patient has been doing very well following recent surgery. He complains of bilateral shoulder pain, but denies any pleuritic CP. He denies symptoms suggestive of CHF. He reports compliance with his Coumadin, and has established with our clinic for monitoring/management. Denies tachycardia palpitations.  EKG today indicates NSR at 65 bpm.   No Known Allergies  Current Outpatient Prescriptions  Medication Sig Dispense Refill  . ALPRAZolam (XANAX) 1 MG tablet Take 1 mg by mouth 5 (five) times daily.      [name] amiodarone (PACERONE) 200 MG tablet Take 1 tablet (200 mg total) by mouth 2 (two) times daily.  60 tablet  1  . aspirin EC 81 MG tablet Take 81 mg by mouth daily.      . metoprolol tartrate (LOPRESSOR) 12.5 mg TABS Take 0.5 tablets (12.5 mg total) by mouth 2 (two) times daily.  30 each  1  . oxyCODONE (OXY IR/ROXICODONE) 5  MG immediate release tablet Take 1-2 tablets by mouth Every 6 hours as needed.      . pantoprazole (PROTONIX) 40 MG tablet Take 40 mg by mouth daily.        . polyethylene glycol powder (MIRALAX) powder Take 17 g by mouth daily as needed. For constipation      . promethazine (PHENERGAN) 25 MG tablet Take 25 mg by mouth every 6 (six) hours as needed.      . simvastatin (ZOCOR) 40 MG tablet Take 40 mg by mouth at bedtime.        [name] venlafaxine (EFFEXOR-XR) 150 MG 24 hr capsule Take 150 mg by mouth 2 (two) times daily.       [name] warfarin (COUMADIN) 1 MG tablet Take 1 tablet (1 mg total) by mouth one time only at 6 PM. As directed  60 tablet  1  . HYDROcodone-acetaminophen (NORCO) 5-325 MG per tablet Take 1 tablet by mouth Three times a day.        Past Medical History  Diagnosis Date  . Murmur     Trace mitral regurgitation by echocardiogram February 2012  . Hyperlipidemia     takes Simvastatin nightly  . Mitral regurgitation     New murmur at the apex consistent with mitral regurgitation  . Aortic stenosis     Bicuspid aortic valve with mild-to-moderate aortic stenosis echocardiogram February 2012 mean gradient 34 mmHg, aortic valve area 0.7 cm, peak velocity 2.43 m/scomment by catheterization mean gradient 18 mmHg December 2010 status post TEE  .  CAD (coronary artery disease)     native vessel, nonobstructive, by catheterization, last catheterization December 2010  . Dyslipidemia   . Fibromyalgia   . Peripheral vascular disease   . Myocardial infarction 10 yrs ago  . Shortness of breath     sitting/lying/exertion  . Bronchitis     hx of-60yrs ago  . Ringing in ears     since seizure 46yrs ago-he hit his head  . Seizures     6 yrs ago from withdrawal of xanax to quickly.  . Dizziness   . Confusion   . Short-term memory loss   . Long-term memory loss   . Stroke     behind right eye-occ sees double 12 yrs ago  . Arthritis   . Joint pain   . Pain and swelling of forearm   . Chronic  back pain     lumbar spondylosis  . Dry skin   . GERD (gastroesophageal reflux disease)     takes Protonix daily  . Constipation     takes Miralax prn  . Hx of colonic polyps   . Slow urinary stream   . Nocturia   . Burning with urination   . Major depressive disorder     wih prior suicidal attempts;takes Effexor bid  . Anxiety     takes Xanax 5 times day    Past Surgical History  Procedure Date  . Inguinal exploration     right  . Femoral hernia repair     x2  . Umbilical hernia repair     [facility]  . Finger contracture release     Tendon release operation on his right little finger  . Appendectomy   . Cholecystectomy     [facility]  . Incisional hernia repair 05/01/2011    Procedure: HERNIA REPAIR INCISIONAL;  Surgeon: Dalia Heading, MD;  Location: [facility];  Service: General;  Laterality: N/A;  with Mesh  . Tonsillectomy     at age 21  . Circumcision   . Cardiac catheterization   . Colonoscopy   . Esophagogastroduodenoscopy 04/07/08/13  . Aortic valve replacement 08/15/2011    Procedure: AORTIC VALVE REPLACEMENT (AVR);  Surgeon: Loreli Slot, MD;  Location: [facility] OR;  Service: Open Heart Surgery;  Laterality: N/A;    History   Social History  . Marital Status: Single    Spouse Name: N/A    Number of Children: N/A  . Years of Education: N/A   Occupational History  . Disabled    Social History Main Topics  . Smoking status: Former Smoker -- 0.5 packs/day for 48 years    Types: Cigarettes    Quit date: 08/28/2011  . Smokeless tobacco: Never Used  . Alcohol Use: No     quit 22yr ago  . Drug Use: No  . Sexually Active: Yes    Birth Control/ Protection: None   Other Topics Concern  . Not on file   Social History Narrative  . No narrative on file    Family History  Problem Relation Age of Onset  . Cancer    . Stroke    . Anesthesia problems Neg Hx   . Hypotension Neg Hx   . Malignant hyperthermia Neg Hx   . Pseudochol deficiency Neg Hx     ROS: no  nausea, vomiting; no fever, chills; no melena, hematochezia; no claudication  PHYSICAL EXAM: BP 95/60  Pulse 65  Ht 6' (1.829 m)  Wt 189 lb (85.73 kg)  BMI  25.63 kg/m2 GENERAL: 62 year old male, sitting upright ; NAD HEENT: NCAT, PERRLA, EOMI; sclera clear; no xanthelasma NECK: palpable bilateral carotid pulses, no bruits; no JVD; no TM LUNGS: CTA bilaterally CARDIAC: RRR (S1,  Crisp S2); no significant murmurs; no rubs or gallops ABDOMEN: soft, non-tender; intact BS EXTREMETIES: intact distal pulses; no significant peripheral edema SKIN: warm/dry; no obvious rash/lesions MUSCULOSKELETAL:  well-healed midline incision  NEURO: no focal deficit; NL affect   EKG: reviewed and available in Electronic Records   ASSESSMENT & PLAN:  No problem-specific assessment & plan notes found for this encounter.   Gene Michaelanthony Kempton, PAC

## 2011-09-11 NOTE — Patient Instructions (Signed)
Pt will start lovenox 120mg  qd this evening And coumadin 3mg  tonight and tomorrow night And recheck INR on 6/14 by [facility] with results to me. Orders given to [facility]

## 2011-09-12 ENCOUNTER — Telehealth: Payer: Self-pay | Admitting: Cardiology

## 2011-09-12 ENCOUNTER — Other Ambulatory Visit: Payer: Self-pay | Admitting: Thoracic Surgery (Cardiothoracic Vascular Surgery)

## 2011-09-12 DIAGNOSIS — I359 Nonrheumatic aortic valve disorder, unspecified: Secondary | ICD-10-CM

## 2011-09-12 NOTE — Telephone Encounter (Signed)
Patient wants to be put back on original pain meds that Dr Shelly Rubenstein gave him and higher dose so he can take more

## 2011-09-13 ENCOUNTER — Telehealth: Payer: Self-pay | Admitting: *Deleted
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
## 2011-09-16 ENCOUNTER — Ambulatory Visit (INDEPENDENT_AMBULATORY_CARE_PROVIDER_SITE_OTHER): Payer: PRIVATE HEALTH INSURANCE | Admitting: *Deleted

## 2011-09-16 DIAGNOSIS — Z952 Presence of prosthetic heart valve: Secondary | ICD-10-CM

## 2011-09-16 DIAGNOSIS — Z954 Presence of other heart-valve replacement: Secondary | ICD-10-CM

## 2011-09-16 DIAGNOSIS — Z7901 Long term (current) use of anticoagulants: Secondary | ICD-10-CM

## 2011-09-16 LAB — POCT INR: INR: 2.4

## 2011-09-17 ENCOUNTER — Ambulatory Visit
Admission: RE | Admit: 2011-09-17 | Discharge: 2011-09-17 | Disposition: A | Discharge status: Home / Self Care | Payer: PRIVATE HEALTH INSURANCE | Source: Ambulatory Visit | Attending: Thoracic Surgery (Cardiothoracic Vascular Surgery) | Admitting: Thoracic Surgery (Cardiothoracic Vascular Surgery)

## 2011-09-17 ENCOUNTER — Ambulatory Visit (INDEPENDENT_AMBULATORY_CARE_PROVIDER_SITE_OTHER): Payer: Self-pay | Admitting: Thoracic Surgery (Cardiothoracic Vascular Surgery)

## 2011-09-17 ENCOUNTER — Encounter: Payer: Self-pay | Admitting: Thoracic Surgery (Cardiothoracic Vascular Surgery)

## 2011-09-17 ENCOUNTER — Other Ambulatory Visit: Payer: Self-pay | Admitting: Thoracic Surgery (Cardiothoracic Vascular Surgery)

## 2011-09-17 VITALS — BP 137/85 | HR 74 | Resp 16 | Ht 72.0 in | Wt 194.5 lb

## 2011-09-17 DIAGNOSIS — I359 Nonrheumatic aortic valve disorder, unspecified: Secondary | ICD-10-CM

## 2011-09-17 DIAGNOSIS — I350 Nonrheumatic aortic (valve) stenosis: Secondary | ICD-10-CM

## 2011-09-17 DIAGNOSIS — Z09 Encounter for follow-up examination after completed treatment for conditions other than malignant neoplasm: Secondary | ICD-10-CM

## 2011-09-17 NOTE — Progress Notes (Signed)
HPI:  [name] returns for a scheduled postoperative followup visit. He had aortic valve replacement with a St. Jude valve on 08/15/2011. He had some atrial fibrillation postoperatively and was discharged home on amiodarone. Of course, he was also discharged home on Coumadin. He says he still has some incisional pain and is taking hydrocodone about 3 times daily. Apparently he never took the amiodarone after leaving the hospital. That may explain why his INR dropped significantly after discharge. His INR was down in the 1.4 range and he was started on Lovenox in addition to adjusting his Coumadin dosage. His INR yesterday was 2.4 and the Lovenox was discontinued. He is scheduled to have his INR checked again on Friday.   Past Medical History  Diagnosis Date  . Murmur     Trace mitral regurgitation by echocardiogram February 2012  . Hyperlipidemia     takes Simvastatin nightly  . Mitral regurgitation     New murmur at the apex consistent with mitral regurgitation  . Aortic stenosis     Bicuspid aortic valve with mild-to-moderate aortic stenosis echocardiogram February 2012 mean gradient 34 mmHg, aortic valve area 0.7 cm, peak velocity 2.43 m/scomment by catheterization mean gradient 18 mmHg December 2010 status post TEE  . CAD (coronary artery disease)     native vessel, nonobstructive, by catheterization, last catheterization December 2010  . Dyslipidemia   . Fibromyalgia   . Peripheral vascular disease   . Myocardial infarction 10 yrs ago  . Shortness of breath     sitting/lying/exertion  . Bronchitis     hx of-39yrs ago  . Ringing in ears     since seizure 26yrs ago-he hit his head  . Seizures     6 yrs ago from withdrawal of xanax to quickly.  . Dizziness   . Confusion   . Short-term memory loss   . Long-term memory loss   . Stroke     behind right eye-occ sees double 12 yrs ago  . Arthritis   . Joint pain   . Pain and swelling of forearm   . Chronic back pain     lumbar  spondylosis  . Dry skin   . GERD (gastroesophageal reflux disease)     takes Protonix daily  . Constipation     takes Miralax prn  . Hx of colonic polyps   . Slow urinary stream   . Nocturia   . Burning with urination   . Major depressive disorder     wih prior suicidal attempts;takes Effexor bid  . Anxiety     takes Xanax 5 times day     Current Outpatient Prescriptions  Medication Sig Dispense Refill  . ALPRAZolam (XANAX) 1 MG tablet Take 1 mg by mouth 5 (five) times daily.      [name] aspirin EC 81 MG tablet Take 81 mg by mouth daily.      [name] enoxaparin (LOVENOX) 100 MG/ML injection Inject 0.9 mLs (90 mg total) into the skin every 12 (twelve) hours.  10 Syringe  0  . metoprolol tartrate (LOPRESSOR) 12.5 mg TABS Take 0.5 tablets (12.5 mg total) by mouth 2 (two) times daily.  30 each  1  . pantoprazole (PROTONIX) 40 MG tablet Take 40 mg by mouth daily.        . polyethylene glycol powder (MIRALAX) powder Take 17 g by mouth daily as needed. For constipation      . promethazine (PHENERGAN) 25 MG tablet Take 25 mg by mouth every 6 (  six) hours as needed.      . simvastatin (ZOCOR) 40 MG tablet Take 40 mg by mouth at bedtime.        [name] venlafaxine (EFFEXOR-XR) 150 MG 24 hr capsule Take 150 mg by mouth 2 (two) times daily.       [name] warfarin (COUMADIN) 1 MG tablet Take 1 tablet (1 mg total) by mouth one time only at 6 PM. As directed  60 tablet  1  . amiodarone (PACERONE) 200 MG tablet Take 1 tablet (200 mg total) by mouth daily.      . furosemide (LASIX) 20 MG tablet       . HYDROcodone-acetaminophen (NORCO) 5-325 MG per tablet Take 1 tablet by mouth Three times a day.      [name] HYDROcodone-acetaminophen (NORCO) 5-325 MG per tablet Take 1 tablet by mouth every 8 (eight) hours as needed for pain.  90 tablet  0  . oxyCODONE (OXY IR/ROXICODONE) 5 MG immediate release tablet Take 1-2 tablets by mouth Every 6 hours as needed.      . potassium chloride SA (K-DUR,KLOR-CON) 20 MEQ tablet       . traMADol  (ULTRAM) 50 MG tablet         Physical Exam BP 137/85  Pulse 74  Resp 16  Ht 6' (1.829 m)  Wt 194 lb 8 oz (88.225 kg)  BMI 26.38 kg/m2  SpO2 98%  general well-developed well-nourished and in no acute distress Neurologic alert and oriented x3 no focal deficits Lungs clear with equal breath sounds bilaterally Cardiac regular rate and rhythm, good valve click Sternum stable, incision was some minimal eschar formation, no evidence of infection   Diagnostic Tests:Chest x-ray 09/17/2011 IMPRESSION:  No acute cardiopulmonary abnormality.    Impression: 61 year old gentleman status post aortic valve replacement with a mechanical valve. Overall he is doing well at this point in time. His INR has improved with adjustment of his Coumadin dose. His exercise tolerance is good. He is still requiring narcotics for pain. It's really unclear how much of this is sternal pain and how much is related to his back and hips, he is very vague in his description of that. He apparently never took amiodarone after leaving the hospital. He has a regular rhythm today, so do not think he needs to take at this point and it will be removed from his medication list.  Not to lift any abscess greater than 10 pounds for at least another 2 weeks. He should not drive a car while taking narcotics.  Plan:  He will followup with Dr. Andee Lineman in [place].  I will be happy to see him back any time if I can be of any further assistance with his care.

## 2011-09-18 NOTE — Telephone Encounter (Signed)
Needs to be seen in office first before I can do this. Should call Dr. Orson Aloe for pain meds related to recent surgery.

## 2011-09-18 NOTE — Telephone Encounter (Signed)
Patient had visit with Dr. Dorris Fetch on 6/18 & states that MD took care of pain med issue.

## 2011-09-19 ENCOUNTER — Telehealth: Payer: Self-pay | Admitting: *Deleted

## 2011-09-19 ENCOUNTER — Ambulatory Visit (INDEPENDENT_AMBULATORY_CARE_PROVIDER_SITE_OTHER): Payer: PRIVATE HEALTH INSURANCE | Admitting: *Deleted

## 2011-09-19 DIAGNOSIS — Z952 Presence of prosthetic heart valve: Secondary | ICD-10-CM

## 2011-09-19 DIAGNOSIS — Z954 Presence of other heart-valve replacement: Secondary | ICD-10-CM

## 2011-09-19 DIAGNOSIS — Z7901 Long term (current) use of anticoagulants: Secondary | ICD-10-CM

## 2011-09-19 LAB — POCT INR: INR: 3.3

## 2011-09-19 NOTE — Telephone Encounter (Signed)
See coumadin note. 

## 2011-09-19 NOTE — Telephone Encounter (Signed)
INR - 3.3  Please call Angie with instructions. / tg

## 2011-09-25 ENCOUNTER — Ambulatory Visit (INDEPENDENT_AMBULATORY_CARE_PROVIDER_SITE_OTHER): Payer: PRIVATE HEALTH INSURANCE | Admitting: *Deleted

## 2011-09-25 DIAGNOSIS — I4891 Unspecified atrial fibrillation: Secondary | ICD-10-CM

## 2011-09-25 DIAGNOSIS — I359 Nonrheumatic aortic valve disorder, unspecified: Secondary | ICD-10-CM

## 2011-09-25 DIAGNOSIS — I313 Pericardial effusion (noninflammatory): Secondary | ICD-10-CM

## 2011-09-25 MED ORDER — MUPIROCIN 2 % EX OINT: TOPICAL_OINTMENT | CUTANEOUS | Status: DC

## 2011-09-30 ENCOUNTER — Telehealth: Payer: Self-pay | Admitting: *Deleted

## 2011-09-30 NOTE — Telephone Encounter (Signed)
Notes Recorded by Lesle Chris, LPN on 07/04/4096 at 12:15 PM Patient notified and verbalized understanding.

## 2011-09-30 NOTE — Telephone Encounter (Signed)
Message copied by Lesle Chris on Mon Sep 30, 2011 12:15 PM ------      Message from: Prescott Parma C      Created: Wed Sep 25, 2011  1:10 PM       EF 50-55%. No pericardial effusion. No further workup

## 2011-10-01 ENCOUNTER — Ambulatory Visit (INDEPENDENT_AMBULATORY_CARE_PROVIDER_SITE_OTHER): Payer: PRIVATE HEALTH INSURANCE | Admitting: *Deleted

## 2011-10-01 DIAGNOSIS — Z954 Presence of other heart-valve replacement: Secondary | ICD-10-CM

## 2011-10-01 DIAGNOSIS — Z7901 Long term (current) use of anticoagulants: Secondary | ICD-10-CM

## 2011-10-01 DIAGNOSIS — Z952 Presence of prosthetic heart valve: Secondary | ICD-10-CM

## 2011-10-05 ENCOUNTER — Emergency Department (HOSPITAL_COMMUNITY): Payer: PRIVATE HEALTH INSURANCE

## 2011-10-05 ENCOUNTER — Encounter (HOSPITAL_COMMUNITY): Payer: Self-pay | Admitting: *Deleted

## 2011-10-05 ENCOUNTER — Emergency Department (HOSPITAL_COMMUNITY)
Admission: EM | Admit: 2011-10-05 | Discharge: 2011-10-05 | Disposition: A | Discharge status: Home / Self Care | Payer: PRIVATE HEALTH INSURANCE | Attending: Emergency Medicine | Admitting: Emergency Medicine

## 2011-10-05 DIAGNOSIS — R079 Chest pain, unspecified: Secondary | ICD-10-CM | POA: Insufficient documentation

## 2011-10-05 DIAGNOSIS — R6884 Jaw pain: Secondary | ICD-10-CM | POA: Insufficient documentation

## 2011-10-05 DIAGNOSIS — Z79899 Other long term (current) drug therapy: Secondary | ICD-10-CM | POA: Insufficient documentation

## 2011-10-05 DIAGNOSIS — Z8673 Personal history of transient ischemic attack (TIA), and cerebral infarction without residual deficits: Secondary | ICD-10-CM | POA: Insufficient documentation

## 2011-10-05 DIAGNOSIS — I252 Old myocardial infarction: Secondary | ICD-10-CM | POA: Insufficient documentation

## 2011-10-05 DIAGNOSIS — R1110 Vomiting, unspecified: Secondary | ICD-10-CM | POA: Insufficient documentation

## 2011-10-05 DIAGNOSIS — M549 Dorsalgia, unspecified: Secondary | ICD-10-CM | POA: Insufficient documentation

## 2011-10-05 DIAGNOSIS — F411 Generalized anxiety disorder: Secondary | ICD-10-CM | POA: Insufficient documentation

## 2011-10-05 DIAGNOSIS — M129 Arthropathy, unspecified: Secondary | ICD-10-CM | POA: Insufficient documentation

## 2011-10-05 DIAGNOSIS — I2510 Atherosclerotic heart disease of native coronary artery without angina pectoris: Secondary | ICD-10-CM | POA: Insufficient documentation

## 2011-10-05 DIAGNOSIS — Z7901 Long term (current) use of anticoagulants: Secondary | ICD-10-CM | POA: Insufficient documentation

## 2011-10-05 DIAGNOSIS — M25519 Pain in unspecified shoulder: Secondary | ICD-10-CM | POA: Insufficient documentation

## 2011-10-05 DIAGNOSIS — K219 Gastro-esophageal reflux disease without esophagitis: Secondary | ICD-10-CM | POA: Insufficient documentation

## 2011-10-05 DIAGNOSIS — G8929 Other chronic pain: Secondary | ICD-10-CM | POA: Insufficient documentation

## 2011-10-05 DIAGNOSIS — F3289 Other specified depressive episodes: Secondary | ICD-10-CM | POA: Insufficient documentation

## 2011-10-05 DIAGNOSIS — G40909 Epilepsy, unspecified, not intractable, without status epilepticus: Secondary | ICD-10-CM | POA: Insufficient documentation

## 2011-10-05 DIAGNOSIS — F329 Major depressive disorder, single episode, unspecified: Secondary | ICD-10-CM | POA: Insufficient documentation

## 2011-10-05 DIAGNOSIS — I739 Peripheral vascular disease, unspecified: Secondary | ICD-10-CM | POA: Insufficient documentation

## 2011-10-05 DIAGNOSIS — E785 Hyperlipidemia, unspecified: Secondary | ICD-10-CM | POA: Insufficient documentation

## 2011-10-05 LAB — CBC
Hemoglobin: 11.3 g/dL — ABNORMAL LOW (ref 13.0–17.0)
MCHC: 31.7 g/dL (ref 30.0–36.0)
Platelets: 205 10*3/uL (ref 150–400)
RBC: 3.98 MIL/uL — ABNORMAL LOW (ref 4.22–5.81)
RDW: 16.4 % — ABNORMAL HIGH (ref 11.5–15.5)

## 2011-10-05 LAB — COMPREHENSIVE METABOLIC PANEL
AST: 17 U/L (ref 0–37)
Albumin: 3.5 g/dL (ref 3.5–5.2)
BUN: 7 mg/dL (ref 6–23)
Calcium: 9.2 mg/dL (ref 8.4–10.5)
Creatinine, Ser: 0.77 mg/dL (ref 0.50–1.35)
Total Bilirubin: 0.6 mg/dL (ref 0.3–1.2)
Total Protein: 7.9 g/dL (ref 6.0–8.3)

## 2011-10-05 LAB — PROTIME-INR
INR: 2.47 — ABNORMAL HIGH (ref 0.00–1.49)
Prothrombin Time: 27.2 seconds — ABNORMAL HIGH (ref 11.6–15.2)

## 2011-10-05 LAB — TROPONIN I
Troponin I: <0.3 ng/mL (ref <0.30)
Troponin I: <0.3 ng/mL (ref <0.30)

## 2011-10-05 MED ORDER — HYDROMORPHONE HCL PF 1 MG/ML IJ SOLN
1.0000 mg | Freq: Once | INTRAMUSCULAR | Status: AC
  Administered 2011-10-05: 1 mg via INTRAVENOUS
  Filled 2011-10-05: qty 1

## 2011-10-05 MED ORDER — ACETAMINOPHEN 325 MG PO TABS
650.0000 mg | ORAL_TABLET | Freq: Once | ORAL | Status: AC
  Administered 2011-10-05: 650 mg via ORAL
  Filled 2011-10-05: qty 2

## 2011-10-05 MED ORDER — ONDANSETRON HCL 4 MG PO TABS: 4.0000 mg | ORAL_TABLET | Freq: Four times a day (QID) | ORAL | Status: DC

## 2011-10-05 MED ORDER — OXYCODONE-ACETAMINOPHEN 5-325 MG PO TABS: 2.0000 | ORAL_TABLET | ORAL | Status: DC | PRN

## 2011-10-05 MED ORDER — ONDANSETRON HCL 4 MG/2ML IJ SOLN
4.0000 mg | Freq: Once | INTRAMUSCULAR | Status: AC
  Administered 2011-10-05: 4 mg via INTRAVENOUS
  Filled 2011-10-05: qty 2

## 2011-10-05 NOTE — ED Notes (Signed)
Pt presents to department for evaluation of midsternal chest pain and bilateral jaw pain. Pt states jaw pain started last night, but CP began this morning. States 4/10 constant pressure sensation at the time. Also states SOB. Respirations unlabored. Lung sounds clear and equal bilaterally. Skin warm and dry. Pt is conscious alert and oriented x4. No signs of distress noted at the present.

## 2011-10-05 NOTE — ED Notes (Signed)
Patient vomited denies pain

## 2011-10-05 NOTE — ED Notes (Signed)
Neck and jaw pain since last night, moved into substernal chest today.  He has more pain in his jaw than chest.  Patient with n/v x 1.  Patient given aspirin 324mg  and nitro x 1.  Patient has saline lock to left ac.  Patient bp 118/82.  Hr 104.  Patient has hx of aortic valve replacement 08-13-11.  He is seen by Dr Andee Lineman and Dr Garner Nash.

## 2011-10-05 NOTE — ED Notes (Signed)
Pt resting quietly at the time. Remains on cardiac monitor. Denies chest pain. Family at bedside. No signs of distress noted.

## 2011-10-05 NOTE — ED Notes (Signed)
cbg 166 per ems

## 2011-10-05 NOTE — ED Provider Notes (Signed)
History     CSN: [id number]  Arrival date & time 10/05/11  1558   First MD Initiated Contact with Patient 10/05/11 1604      Chief Complaint  Patient presents with  . Chest Pain     The history is provided by the patient.   patient reports developing chest pain jaw pain neck pain and bilateral shoulder pain that has been persistent since last night.  He reports the discomfort in his jaws worse than the chest.  He has no shortness of breath.  He denies nausea at this time.  He did vomit one time in route.  He was given EMS and nitroglycerin x1 by EMS.  He recently had an aortic valve that was replaced on 08/13/2011.  He denies a history of coronary artery disease.  His discomfort has been persistent and constant since last night.  Pain is worsened by movement of his arms and by palpation of his bilateral trapezius muscles.  He denies fevers or chills.  His had no cough or congestion.  Denies unilateral leg swelling.  Past Medical History  Diagnosis Date  . Murmur     Trace mitral regurgitation by echocardiogram February 2012  . Hyperlipidemia     takes Simvastatin nightly  . Mitral regurgitation     New murmur at the apex consistent with mitral regurgitation  . Aortic stenosis     Bicuspid aortic valve with mild-to-moderate aortic stenosis echocardiogram February 2012 mean gradient 34 mmHg, aortic valve area 0.7 cm, peak velocity 2.43 m/scomment by catheterization mean gradient 18 mmHg December 2010 status post TEE  . CAD (coronary artery disease)     native vessel, nonobstructive, by catheterization, last catheterization December 2010  . Dyslipidemia   . Fibromyalgia   . Peripheral vascular disease   . Myocardial infarction 10 yrs ago  . Shortness of breath     sitting/lying/exertion  . Bronchitis     hx of-48yrs ago  . Ringing in ears     since seizure 40yrs ago-he hit his head  . Seizures     6 yrs ago from withdrawal of xanax to quickly.  . Dizziness   . Confusion   .  Short-term memory loss   . Long-term memory loss   . Stroke     behind right eye-occ sees double 12 yrs ago  . Arthritis   . Joint pain   . Pain and swelling of forearm   . Chronic back pain     lumbar spondylosis  . Dry skin   . GERD (gastroesophageal reflux disease)     takes Protonix daily  . Constipation     takes Miralax prn  . Hx of colonic polyps   . Slow urinary stream   . Nocturia   . Burning with urination   . Major depressive disorder     wih prior suicidal attempts;takes Effexor bid  . Anxiety     takes Xanax 5 times day    Past Surgical History  Procedure Date  . Inguinal exploration     right  . Femoral hernia repair     x2  . Umbilical hernia repair     [facility]  . Finger contracture release     Tendon release operation on his right little finger  . Appendectomy   . Cholecystectomy     [facility]  . Incisional hernia repair 05/01/2011    Procedure: HERNIA REPAIR INCISIONAL;  Surgeon: Dalia Heading, MD;  Location: [facility];  Service:  General;  Laterality: N/A;  with Mesh  . Tonsillectomy     at age 48  . Circumcision   . Cardiac catheterization   . Colonoscopy   . Esophagogastroduodenoscopy 04/07/08/13  . Aortic valve replacement 08/15/2011    Procedure: AORTIC VALVE REPLACEMENT (AVR);  Surgeon: Loreli Slot, MD;  Location: [facility] OR;  Service: Open Heart Surgery;  Laterality: N/A;    Family History  Problem Relation Age of Onset  . Cancer    . Stroke    . Anesthesia problems Neg Hx   . Hypotension Neg Hx   . Malignant hyperthermia Neg Hx   . Pseudochol deficiency Neg Hx     History  Substance Use Topics  . Smoking status: Former Smoker -- 0.5 packs/day for 48 years    Types: Cigarettes    Quit date: 08/28/2011  . Smokeless tobacco: Never Used  . Alcohol Use: No     quit 33yr ago      Review of Systems  All other systems reviewed and are negative.    Allergies  Review of patient's allergies indicates no known allergies.  Home  Medications   Current Outpatient Rx  Name Route Sig Dispense Refill  . ALPRAZOLAM 1 MG PO TABS Oral Take 1 mg by mouth 3 (three) times daily as needed.     . ASPIRIN EC 81 MG PO TBEC Oral Take 81 mg by mouth daily.    . FUROSEMIDE 20 MG PO TABS Oral Take 20 mg by mouth daily.     [name] HYDROCODONE-ACETAMINOPHEN 5-325 MG PO TABS Oral Take 1 tablet by mouth Three times a day.    [name] METOPROLOL TARTRATE 12.5 MG HALF TABLET Oral Take 12.5 mg by mouth 2 (two) times daily.    [name] MUPIROCIN 2 % EX OINT  Apply as directed    . OXYCODONE HCL 5 MG PO TABS Oral Take 1-2 tablets by mouth Every 6 hours as needed.    [name] PANTOPRAZOLE SODIUM 40 MG PO TBEC Oral Take 40 mg by mouth daily.      [name] POLYETHYLENE GLYCOL 3350 PO POWD Oral Take 17 g by mouth daily as needed. For constipation    . PROMETHAZINE HCL 25 MG PO TABS Oral Take 25 mg by mouth every 6 (six) hours as needed. For nausea    . SIMVASTATIN 40 MG PO TABS Oral Take 40 mg by mouth at bedtime.      . VENLAFAXINE HCL ER 150 MG PO CP24 Oral Take 150 mg by mouth 2 (two) times daily.     . WARFARIN SODIUM 2 MG PO TABS Oral Take 2 mg by mouth daily.      BP 141/82  Pulse 99  Temp 98.5 F (36.9 C) (Oral)  Resp 18  SpO2 96%  Physical Exam  Nursing note and vitals reviewed. Constitutional: He is oriented to person, place, and time. He appears well-developed and well-nourished.  HENT:  Head: Normocephalic and atraumatic.       Edentulous  Eyes: EOM are normal.  Neck: Normal range of motion. Neck supple.  Cardiovascular: Normal rate, regular rhythm, normal heart sounds and intact distal pulses.   Pulmonary/Chest: Effort normal and breath sounds normal. No respiratory distress.  Abdominal: Soft. He exhibits no distension. There is no tenderness.  Musculoskeletal: Normal range of motion.       Soreness of the bilateral trapezius muscles.  No cervical spine tenderness  Neurological: He is alert and oriented to person, place, and time.  Skin: Skin is warm  and dry.  Psychiatric: He has a normal mood and affect. Judgment normal.    ED Course  Procedures    Date: 10/05/2011  Rate: 103  Rhythm: normal sinus rhythm  QRS Axis: normal  Intervals: normal  ST/T Wave abnormalities: normal  Conduction Disutrbances: RBBB  Narrative Interpretation:   Old EKG Reviewed: No significant changes noted    Labs Reviewed  CBC - Abnormal; Notable for the following:    WBC 13.3 (*)     RBC 3.98 (*)     Hemoglobin 11.3 (*)     HCT 35.7 (*)     RDW 16.4 (*)     All other components within normal limits  COMPREHENSIVE METABOLIC PANEL - Abnormal; Notable for the following:    Sodium 132 (*)     Glucose, Bld 138 (*)     All other components within normal limits  PROTIME-INR - Abnormal; Notable for the following:    Prothrombin Time 27.2 (*)     INR 2.47 (*)     All other components within normal limits  TROPONIN I  TROPONIN I   Dg Chest 2 View  10/05/2011  *RADIOLOGY REPORT*  Clinical Data: Pain in the neck and throat.  Previous aortic valve replacement.  CHEST - 2 VIEW  Comparison: 09/17/2011  Findings: There has been previous median sternotomy and aortic valve replacement.  The heart is mildly enlarged.  Aortic shadows appear the same.  Question mild venous hypertension without frank edema.  No effusions.  IMPRESSION: Previous AVR.  Possible venous hypertension/early edema.  Original Report Authenticated By: Thomasenia Sales, M.D.    I personally reviewed the imaging tests through PACS system  I reviewed available ER/hospitalization records thought the EMR   1. Chest pain       MDM  The patient's pain is been constant this does not appear to be related to his new valve nor is this a presentation of ACS.  Troponin x2 are negative.  EKG is normal.  His INR is therapeutic.  He did vomit one more time in the emergency department his abdominal exam is benign he has no abdominal pain at this time.  Patient be discharged home with pain medicine a  short course of antinausea medicine.  Patient is instructed to followup with his Dr. on Monday for persistent symptoms.  The patient understands return the emergency apartment for new or worsening symptoms        Lyanne Co, MD 10/05/11 2133

## 2011-10-07 ENCOUNTER — Inpatient Hospital Stay (HOSPITAL_COMMUNITY): Payer: Medicare Other

## 2011-10-07 ENCOUNTER — Encounter (HOSPITAL_COMMUNITY): Payer: Self-pay | Admitting: Anesthesiology

## 2011-10-07 ENCOUNTER — Inpatient Hospital Stay (HOSPITAL_COMMUNITY): Payer: Medicare Other | Admitting: Anesthesiology

## 2011-10-07 ENCOUNTER — Encounter (HOSPITAL_COMMUNITY)
Admission: EM | Disposition: A | Discharge status: Home / Self Care | Payer: Self-pay | Source: Other Acute Inpatient Hospital | Attending: Thoracic Surgery (Cardiothoracic Vascular Surgery)

## 2011-10-07 ENCOUNTER — Inpatient Hospital Stay (HOSPITAL_COMMUNITY)
Admission: EM | Admit: 2011-10-07 | Discharge: 2011-10-23 | DRG: 219 | Disposition: A | Discharge status: Home / Self Care | Payer: Medicare Other | Source: Other Acute Inpatient Hospital | Attending: Thoracic Surgery (Cardiothoracic Vascular Surgery) | Admitting: Thoracic Surgery (Cardiothoracic Vascular Surgery)

## 2011-10-07 DIAGNOSIS — R5381 Other malaise: Secondary | ICD-10-CM | POA: Diagnosis present

## 2011-10-07 DIAGNOSIS — N170 Acute kidney failure with tubular necrosis: Secondary | ICD-10-CM | POA: Diagnosis not present

## 2011-10-07 DIAGNOSIS — E876 Hypokalemia: Secondary | ICD-10-CM | POA: Diagnosis not present

## 2011-10-07 DIAGNOSIS — R55 Syncope and collapse: Secondary | ICD-10-CM | POA: Diagnosis present

## 2011-10-07 DIAGNOSIS — Z7982 Long term (current) use of aspirin: Secondary | ICD-10-CM

## 2011-10-07 DIAGNOSIS — I219 Acute myocardial infarction, unspecified: Secondary | ICD-10-CM | POA: Diagnosis present

## 2011-10-07 DIAGNOSIS — I252 Old myocardial infarction: Secondary | ICD-10-CM

## 2011-10-07 DIAGNOSIS — I2510 Atherosclerotic heart disease of native coronary artery without angina pectoris: Secondary | ICD-10-CM | POA: Diagnosis present

## 2011-10-07 DIAGNOSIS — R570 Cardiogenic shock: Secondary | ICD-10-CM | POA: Diagnosis present

## 2011-10-07 DIAGNOSIS — IMO0001 Reserved for inherently not codable concepts without codable children: Secondary | ICD-10-CM | POA: Diagnosis present

## 2011-10-07 DIAGNOSIS — M549 Dorsalgia, unspecified: Secondary | ICD-10-CM | POA: Diagnosis present

## 2011-10-07 DIAGNOSIS — K5900 Constipation, unspecified: Secondary | ICD-10-CM | POA: Diagnosis present

## 2011-10-07 DIAGNOSIS — Z954 Presence of other heart-valve replacement: Secondary | ICD-10-CM

## 2011-10-07 DIAGNOSIS — Z87891 Personal history of nicotine dependence: Secondary | ICD-10-CM

## 2011-10-07 DIAGNOSIS — I080 Rheumatic disorders of both mitral and aortic valves: Secondary | ICD-10-CM | POA: Diagnosis present

## 2011-10-07 DIAGNOSIS — I711 Thoracic aortic aneurysm, ruptured, unspecified: Principal | ICD-10-CM | POA: Diagnosis present

## 2011-10-07 DIAGNOSIS — I4891 Unspecified atrial fibrillation: Secondary | ICD-10-CM

## 2011-10-07 DIAGNOSIS — I319 Disease of pericardium, unspecified: Secondary | ICD-10-CM | POA: Diagnosis present

## 2011-10-07 DIAGNOSIS — R000 Tachycardia, unspecified: Secondary | ICD-10-CM | POA: Diagnosis present

## 2011-10-07 DIAGNOSIS — D649 Anemia, unspecified: Secondary | ICD-10-CM | POA: Diagnosis present

## 2011-10-07 DIAGNOSIS — E785 Hyperlipidemia, unspecified: Secondary | ICD-10-CM | POA: Diagnosis present

## 2011-10-07 DIAGNOSIS — R791 Abnormal coagulation profile: Secondary | ICD-10-CM | POA: Diagnosis not present

## 2011-10-07 DIAGNOSIS — F341 Dysthymic disorder: Secondary | ICD-10-CM | POA: Diagnosis present

## 2011-10-07 DIAGNOSIS — Z8673 Personal history of transient ischemic attack (TIA), and cerebral infarction without residual deficits: Secondary | ICD-10-CM

## 2011-10-07 DIAGNOSIS — Z7901 Long term (current) use of anticoagulants: Secondary | ICD-10-CM

## 2011-10-07 DIAGNOSIS — I350 Nonrheumatic aortic (valve) stenosis: Secondary | ICD-10-CM

## 2011-10-07 DIAGNOSIS — T45515A Adverse effect of anticoagulants, initial encounter: Secondary | ICD-10-CM | POA: Diagnosis present

## 2011-10-07 DIAGNOSIS — I739 Peripheral vascular disease, unspecified: Secondary | ICD-10-CM | POA: Diagnosis present

## 2011-10-07 DIAGNOSIS — D696 Thrombocytopenia, unspecified: Secondary | ICD-10-CM | POA: Diagnosis present

## 2011-10-07 DIAGNOSIS — D72829 Elevated white blood cell count, unspecified: Secondary | ICD-10-CM | POA: Diagnosis not present

## 2011-10-07 DIAGNOSIS — J90 Pleural effusion, not elsewhere classified: Secondary | ICD-10-CM | POA: Diagnosis present

## 2011-10-07 HISTORY — PX: STERNOTOMY: SHX1057

## 2011-10-07 HISTORY — PX: THORACIC AORTIC ANEURYSM REPAIR: SHX799

## 2011-10-07 LAB — POCT I-STAT 4, (NA,K, GLUC, HGB,HCT)
Glucose, Bld: 124 mg/dL — ABNORMAL HIGH (ref 70–99)
Glucose, Bld: 148 mg/dL — ABNORMAL HIGH (ref 70–99)
Glucose, Bld: 152 mg/dL — ABNORMAL HIGH (ref 70–99)
Glucose, Bld: 285 mg/dL — ABNORMAL HIGH (ref 70–99)
Glucose, Bld: 241 mg/dL — ABNORMAL HIGH (ref 70–99)
HCT: 27 % — ABNORMAL LOW (ref 39.0–52.0)
HCT: 20 % — ABNORMAL LOW (ref 39.0–52.0)
HCT: 21 % — ABNORMAL LOW (ref 39.0–52.0)
HCT: 21 % — ABNORMAL LOW (ref 39.0–52.0)
HCT: 21 % — ABNORMAL LOW (ref 39.0–52.0)
HCT: 21 % — ABNORMAL LOW (ref 39.0–52.0)
Hemoglobin: 9.2 g/dL — ABNORMAL LOW (ref 13.0–17.0)
Hemoglobin: 6.8 g/dL — CL (ref 13.0–17.0)
Hemoglobin: 6.8 g/dL — CL (ref 13.0–17.0)
Hemoglobin: 7.1 g/dL — ABNORMAL LOW (ref 13.0–17.0)
Hemoglobin: 6.8 g/dL — CL (ref 13.0–17.0)
Hemoglobin: 8.2 g/dL — ABNORMAL LOW (ref 13.0–17.0)
Hemoglobin: 8.8 g/dL — ABNORMAL LOW (ref 13.0–17.0)
Hemoglobin: 9.2 g/dL — ABNORMAL LOW (ref 13.0–17.0)
Hemoglobin: 7.1 g/dL — ABNORMAL LOW (ref 13.0–17.0)
Hemoglobin: 7.8 g/dL — ABNORMAL LOW (ref 13.0–17.0)
Potassium: 4.6 mEq/L (ref 3.5–5.1)
Potassium: 4.4 mEq/L (ref 3.5–5.1)
Potassium: 5.2 mEq/L — ABNORMAL HIGH (ref 3.5–5.1)
Potassium: 7.8 mEq/L (ref 3.5–5.1)
Potassium: 6.9 mEq/L (ref 3.5–5.1)
Potassium: 5.8 mEq/L — ABNORMAL HIGH (ref 3.5–5.1)
Potassium: 5.9 mEq/L — ABNORMAL HIGH (ref 3.5–5.1)
Sodium: 127 mEq/L — ABNORMAL LOW (ref 135–145)
Sodium: 128 mEq/L — ABNORMAL LOW (ref 135–145)
Sodium: 123 mEq/L — ABNORMAL LOW (ref 135–145)
Sodium: 131 mEq/L — ABNORMAL LOW (ref 135–145)
Sodium: 129 mEq/L — ABNORMAL LOW (ref 135–145)
Sodium: 133 mEq/L — ABNORMAL LOW (ref 135–145)
Sodium: 141 mEq/L (ref 135–145)

## 2011-10-07 LAB — PROTIME-INR
INR: 2.45 — ABNORMAL HIGH (ref 0.00–1.49)
INR: 1.67 — ABNORMAL HIGH (ref 0.00–1.49)
INR: 1.58 — ABNORMAL HIGH (ref 0.00–1.49)
Prothrombin Time: 20 s — ABNORMAL HIGH (ref 11.6–15.2)
Prothrombin Time: 19.2 seconds — ABNORMAL HIGH (ref 11.6–15.2)

## 2011-10-07 LAB — POCT I-STAT 3, ART BLOOD GAS (G3+)
Acid-Base Excess: 4 mmol/L — ABNORMAL HIGH (ref 0.0–2.0)
Acid-Base Excess: 8 mmol/L — ABNORMAL HIGH (ref 0.0–2.0)
Acid-base deficit: 6 mmol/L — ABNORMAL HIGH (ref 0.0–2.0)
Acid-base deficit: 13 mmol/L — ABNORMAL HIGH (ref 0.0–2.0)
Acid-base deficit: 9 mmol/L — ABNORMAL HIGH (ref 0.0–2.0)
Acid-base deficit: 10 mmol/L — ABNORMAL HIGH (ref 0.0–2.0)
Acid-base deficit: 9 mmol/L — ABNORMAL HIGH (ref 0.0–2.0)
Bicarbonate: 20 mEq/L (ref 20.0–24.0)
Bicarbonate: 15.3 mEq/L — ABNORMAL LOW (ref 20.0–24.0)
Bicarbonate: 17.2 mEq/L — ABNORMAL LOW (ref 20.0–24.0)
Bicarbonate: 18 mEq/L — ABNORMAL LOW (ref 20.0–24.0)
Bicarbonate: 28.9 mEq/L — ABNORMAL HIGH (ref 20.0–24.0)
Bicarbonate: 25.2 mEq/L — ABNORMAL HIGH (ref 20.0–24.0)
Bicarbonate: 30.8 mEq/L — ABNORMAL HIGH (ref 20.0–24.0)
O2 Saturation: 74 %
O2 Saturation: 77 %
O2 Saturation: 84 %
O2 Saturation: 85 %
O2 Saturation: 96 %
Patient temperature: 36.3
Patient temperature: 36
TCO2: 17 mmol/L (ref 0–100)
TCO2: 19 mmol/L (ref 0–100)
TCO2: 19 mmol/L (ref 0–100)
TCO2: 30 mmol/L (ref 0–100)
TCO2: 27 mmol/L (ref 0–100)
pCO2 arterial: 39.3 mmHg (ref 35.0–45.0)
pCO2 arterial: 39.7 mmHg (ref 35.0–45.0)
pCO2 arterial: 39.7 mmHg (ref 35.0–45.0)
pCO2 arterial: 40 mmHg (ref 35.0–45.0)
pCO2 arterial: 42.2 mmHg (ref 35.0–45.0)
pCO2 arterial: 42.9 mmHg (ref 35.0–45.0)
pCO2 arterial: 42.5 mmHg (ref 35.0–45.0)
pH, Arterial: 7.314 — ABNORMAL LOW (ref 7.350–7.450)
pH, Arterial: 7.244 — ABNORMAL LOW (ref 7.350–7.450)
pH, Arterial: 7.265 — ABNORMAL LOW (ref 7.350–7.450)
pH, Arterial: 7.459 — ABNORMAL HIGH (ref 7.350–7.450)
pH, Arterial: 7.487 — ABNORMAL HIGH (ref 7.350–7.450)
pO2, Arterial: 43 mmHg — ABNORMAL LOW (ref 80.0–100.0)
pO2, Arterial: 52 mmHg — ABNORMAL LOW (ref 80.0–100.0)
pO2, Arterial: 48 mmHg — ABNORMAL LOW (ref 80.0–100.0)
pO2, Arterial: 56 mmHg — ABNORMAL LOW (ref 80.0–100.0)
pO2, Arterial: 57 mmHg — ABNORMAL LOW (ref 80.0–100.0)
pO2, Arterial: 98 mmHg (ref 80.0–100.0)
pO2, Arterial: 46 mmHg — ABNORMAL LOW (ref 80.0–100.0)
pO2, Arterial: 55 mmHg — ABNORMAL LOW (ref 80.0–100.0)

## 2011-10-07 LAB — BASIC METABOLIC PANEL
BUN: 23 mg/dL (ref 6–23)
Calcium: 7.8 mg/dL — ABNORMAL LOW (ref 8.4–10.5)
Chloride: 101 mEq/L (ref 96–112)
Creatinine, Ser: 1.22 mg/dL (ref 0.50–1.35)
GFR calc Af Amer: 73 mL/min — ABNORMAL LOW (ref >90)

## 2011-10-07 LAB — HEMOGLOBIN AND HEMATOCRIT, BLOOD
HCT: 21.2 % — ABNORMAL LOW (ref 39.0–52.0)
Hemoglobin: 7.2 g/dL — ABNORMAL LOW (ref 13.0–17.0)

## 2011-10-07 LAB — DIC (DISSEMINATED INTRAVASCULAR COAGULATION)PANEL
Fibrinogen: 245 mg/dL (ref 204–475)
Prothrombin Time: 23 seconds — ABNORMAL HIGH (ref 11.6–15.2)
Smear Review: NONE SEEN
aPTT: 70 seconds — ABNORMAL HIGH (ref 24–37)

## 2011-10-07 LAB — CBC
HCT: 27.7 % — ABNORMAL LOW (ref 39.0–52.0)
HCT: 21 % — ABNORMAL LOW (ref 39.0–52.0)
HCT: 23.6 % — ABNORMAL LOW (ref 39.0–52.0)
HCT: 25.7 % — ABNORMAL LOW (ref 39.0–52.0)
Hemoglobin: 7.3 g/dL — ABNORMAL LOW (ref 13.0–17.0)
Hemoglobin: 8.2 g/dL — ABNORMAL LOW (ref 13.0–17.0)
Hemoglobin: 9.1 g/dL — ABNORMAL LOW (ref 13.0–17.0)
MCHC: 32.9 g/dL (ref 30.0–36.0)
MCHC: 34.8 g/dL (ref 30.0–36.0)
MCHC: 34.7 g/dL (ref 30.0–36.0)
MCV: 87.9 fL (ref 78.0–100.0)
MCV: 85.2 fL (ref 78.0–100.0)
Platelets: 198 10*3/uL (ref 150–400)
RBC: 3.02 MIL/uL — ABNORMAL LOW (ref 4.22–5.81)
RDW: 16.7 % — ABNORMAL HIGH (ref 11.5–15.5)
RDW: 15.8 % — ABNORMAL HIGH (ref 11.5–15.5)
WBC: 8.4 10*3/uL (ref 4.0–10.5)

## 2011-10-07 LAB — PREPARE CRYOPRECIPITATE: Unit division: 0

## 2011-10-07 LAB — POCT I-STAT, CHEM 8
BUN: 26 mg/dL — ABNORMAL HIGH (ref 6–23)
Calcium, Ion: 1.41 mmol/L — ABNORMAL HIGH (ref 1.12–1.23)
Chloride: 97 mEq/L (ref 96–112)
Glucose, Bld: 78 mg/dL (ref 70–99)

## 2011-10-07 LAB — GLUCOSE, CAPILLARY: Glucose-Capillary: 96 mg/dL (ref 70–99)

## 2011-10-07 LAB — APTT: aPTT: 138 seconds — ABNORMAL HIGH (ref 24–37)

## 2011-10-07 SURGERY — STERNOTOMY
Anesthesia: General | Wound class: Clean

## 2011-10-07 MED ORDER — EPINEPHRINE HCL 1 MG/ML IJ SOLN: 0.5000 ug/min | INTRAVENOUS | Status: DC

## 2011-10-07 MED ORDER — ACETAMINOPHEN 160 MG/5ML PO SOLN
650.0000 mg | ORAL | Status: AC
  Filled 2011-10-07 (×2): qty 20.3

## 2011-10-07 MED ORDER — DEXMEDETOMIDINE HCL IN NACL 400 MCG/100ML IV SOLN
0.1000 ug/kg/h | INTRAVENOUS | Status: DC
  Filled 2011-10-07: qty 100

## 2011-10-07 MED ORDER — METOPROLOL TARTRATE 12.5 MG HALF TABLET
12.5000 mg | ORAL_TABLET | Freq: Two times a day (BID) | ORAL | Status: DC
  Filled 2011-10-07 (×3): qty 1

## 2011-10-07 MED ORDER — SODIUM CHLORIDE 0.45 % IV SOLN
INTRAVENOUS | Status: DC
  Administered 2011-10-09 – 2011-10-20 (×5): via INTRAVENOUS

## 2011-10-07 MED ORDER — 0.9 % SODIUM CHLORIDE (POUR BTL) OPTIME
TOPICAL | Status: DC | PRN
  Administered 2011-10-07: 6000 mL

## 2011-10-07 MED ORDER — DEXTROSE 5 % IV SOLN
1.5000 g | Freq: Two times a day (BID) | INTRAVENOUS | Status: DC
  Administered 2011-10-07 – 2011-10-08 (×2): 1.5 g via INTRAVENOUS
  Filled 2011-10-07 (×3): qty 1.5

## 2011-10-07 MED ORDER — MAGNESIUM SULFATE 50 % IJ SOLN: 40.0000 meq | INTRAMUSCULAR | Status: DC

## 2011-10-07 MED ORDER — SODIUM CHLORIDE 0.9 % IV SOLN
100.0000 [IU] | INTRAVENOUS | Status: DC | PRN
  Administered 2011-10-07: 1 [IU]/h via INTRAVENOUS

## 2011-10-07 MED ORDER — EPINEPHRINE HCL 0.1 MG/ML IJ SOLN
INTRAMUSCULAR | Status: DC | PRN
  Administered 2011-10-07 (×2): .05 mg via INTRAVENOUS
  Administered 2011-10-07 (×4): 0.1 mg via INTRAVENOUS

## 2011-10-07 MED ORDER — BISACODYL 10 MG RE SUPP: 10.0000 mg | Freq: Every day | RECTAL | Status: DC

## 2011-10-07 MED ORDER — TRANEXAMIC ACID (OHS) BOLUS VIA INFUSION: 15.0000 mg/kg | INTRAVENOUS | Status: DC

## 2011-10-07 MED ORDER — NITROGLYCERIN IN D5W 200-5 MCG/ML-% IV SOLN: 2.0000 ug/min | INTRAVENOUS | Status: DC

## 2011-10-07 MED ORDER — SODIUM BICARBONATE 8.4 % IV SOLN
INTRAVENOUS | Status: DC
  Filled 2011-10-07: qty 2.5

## 2011-10-07 MED ORDER — ASPIRIN 81 MG PO CHEW: 324.0000 mg | CHEWABLE_TABLET | Freq: Every day | ORAL | Status: DC

## 2011-10-07 MED ORDER — ONDANSETRON HCL 4 MG/2ML IJ SOLN: 4.0000 mg | Freq: Four times a day (QID) | INTRAMUSCULAR | Status: DC | PRN

## 2011-10-07 MED ORDER — AMIODARONE HCL IN DEXTROSE 360-4.14 MG/200ML-% IV SOLN
1.0000 mg/min | INTRAVENOUS | Status: AC
  Administered 2011-10-07 (×2): 1 mg/min via INTRAVENOUS
  Filled 2011-10-07 (×2): qty 200

## 2011-10-07 MED ORDER — NITROGLYCERIN 5 MG/ML IV SOLN: 2.0000 ug/min | INTRAVENOUS | Status: DC

## 2011-10-07 MED ORDER — POTASSIUM CHLORIDE 2 MEQ/ML IV SOLN: 80.0000 meq | INTRAVENOUS | Status: DC

## 2011-10-07 MED ORDER — CALCIUM CHLORIDE 10 % IV SOLN
1.0000 g | Freq: Once | INTRAVENOUS | Status: AC
  Administered 2011-10-07: 1 g via INTRAVENOUS

## 2011-10-07 MED ORDER — FENTANYL CITRATE 0.05 MG/ML IJ SOLN
INTRAMUSCULAR | Status: DC | PRN
  Administered 2011-10-07: 250 ug via INTRAVENOUS
  Administered 2011-10-07: 100 ug via INTRAVENOUS
  Administered 2011-10-07: 50 ug via INTRAVENOUS
  Administered 2011-10-07: 200 ug via INTRAVENOUS
  Administered 2011-10-07: 150 ug via INTRAVENOUS
  Administered 2011-10-07: 250 ug via INTRAVENOUS

## 2011-10-07 MED ORDER — DEXMEDETOMIDINE HCL IN NACL 200 MCG/50ML IV SOLN
0.4000 ug/kg/h | INTRAVENOUS | Status: DC
  Filled 2011-10-07: qty 50

## 2011-10-07 MED ORDER — INSULIN ASPART 100 UNIT/ML ~~LOC~~ SOLN
SUBCUTANEOUS | Status: DC | PRN
  Administered 2011-10-07 (×2): 20 [IU] via SUBCUTANEOUS
  Administered 2011-10-07: 10 [IU] via SUBCUTANEOUS

## 2011-10-07 MED ORDER — POTASSIUM CHLORIDE 10 MEQ/50ML IV SOLN: 10.0000 meq | INTRAVENOUS | Status: AC

## 2011-10-07 MED ORDER — DOPAMINE-DEXTROSE 3.2-5 MG/ML-% IV SOLN: 2.0000 ug/kg/min | INTRAVENOUS | Status: DC

## 2011-10-07 MED ORDER — MILRINONE IN DEXTROSE 200-5 MCG/ML-% IV SOLN
INTRAVENOUS | Status: DC | PRN
  Administered 2011-10-07: .3 mg/kg/min via INTRAVENOUS

## 2011-10-07 MED ORDER — LACTATED RINGERS IV SOLN
INTRAVENOUS | Status: DC | PRN
  Administered 2011-10-07 (×3): via INTRAVENOUS

## 2011-10-07 MED ORDER — ALBUMIN HUMAN 5 % IV SOLN
12.5000 g | Freq: Once | INTRAVENOUS | Status: AC
  Administered 2011-10-07: 12.5 g via INTRAVENOUS

## 2011-10-07 MED ORDER — CALCIUM CHLORIDE 10 % IV SOLN
INTRAVENOUS | Status: DC | PRN
  Administered 2011-10-07: 1000 mg via INTRAVENOUS
  Administered 2011-10-07: 100 mg via INTRAVENOUS
  Administered 2011-10-07: 900 mg via INTRAVENOUS
  Administered 2011-10-07: 1000 mg via INTRAVENOUS

## 2011-10-07 MED ORDER — AMIODARONE HCL IN DEXTROSE 360-4.14 MG/200ML-% IV SOLN
0.5000 mg/min | INTRAVENOUS | Status: DC
  Administered 2011-10-08 – 2011-10-16 (×14): 0.5 mg/min via INTRAVENOUS
  Filled 2011-10-07 (×39): qty 200

## 2011-10-07 MED ORDER — MIDAZOLAM HCL 5 MG/5ML IJ SOLN
INTRAMUSCULAR | Status: DC | PRN
  Administered 2011-10-07: 4 mg via INTRAVENOUS
  Administered 2011-10-07: 6 mg via INTRAVENOUS

## 2011-10-07 MED ORDER — SODIUM BICARBONATE 8.4 % IV SOLN: INTRAVENOUS | Status: DC

## 2011-10-07 MED ORDER — SIMVASTATIN 40 MG PO TABS
40.0000 mg | ORAL_TABLET | Freq: Every day | ORAL | Status: DC
  Filled 2011-10-07 (×2): qty 1

## 2011-10-07 MED ORDER — ALPRAZOLAM 0.5 MG PO TABS
1.0000 mg | ORAL_TABLET | Freq: Three times a day (TID) | ORAL | Status: DC | PRN
  Administered 2011-10-15 – 2011-10-17 (×3): 1 mg via ORAL
  Administered 2011-10-19: 0.5 mg via ORAL
  Filled 2011-10-07: qty 2
  Filled 2011-10-07: qty 1
  Filled 2011-10-07: qty 2
  Filled 2011-10-07 (×2): qty 1
  Filled 2011-10-07: qty 2

## 2011-10-07 MED ORDER — LACTATED RINGERS IV SOLN: 500.0000 mL | Freq: Once | INTRAVENOUS | Status: AC | PRN

## 2011-10-07 MED ORDER — DEXMEDETOMIDINE HCL IN NACL 400 MCG/100ML IV SOLN: 0.1000 ug/kg/h | INTRAVENOUS | Status: DC

## 2011-10-07 MED ORDER — WARFARIN SODIUM 2 MG PO TABS: 2.0000 mg | ORAL_TABLET | Freq: Every day | ORAL | Status: DC

## 2011-10-07 MED ORDER — PHENYLEPHRINE HCL 10 MG/ML IJ SOLN
30.0000 ug/min | INTRAVENOUS | Status: DC
  Filled 2011-10-07: qty 2

## 2011-10-07 MED ORDER — ETOMIDATE 2 MG/ML IV SOLN
INTRAVENOUS | Status: DC | PRN
  Administered 2011-10-07: 20 mg via INTRAVENOUS

## 2011-10-07 MED ORDER — POTASSIUM CHLORIDE 10 MEQ/50ML IV SOLN
10.0000 meq | Freq: Once | INTRAVENOUS | Status: DC
  Administered 2011-10-07: 10 meq via INTRAVENOUS

## 2011-10-07 MED ORDER — SODIUM CHLORIDE 0.9 % IV SOLN
250.0000 mL | INTRAVENOUS | Status: DC
  Administered 2011-10-08: 250 mL via INTRAVENOUS

## 2011-10-07 MED ORDER — SODIUM CHLORIDE 0.9 % IV SOLN
INTRAVENOUS | Status: DC
  Filled 2011-10-07: qty 1

## 2011-10-07 MED ORDER — ASPIRIN EC 325 MG PO TBEC
325.0000 mg | DELAYED_RELEASE_TABLET | Freq: Every day | ORAL | Status: DC
  Administered 2011-10-08: 325 mg via ORAL
  Filled 2011-10-07: qty 1

## 2011-10-07 MED ORDER — DEXTROSE 5 % IV SOLN: 1.5000 g | INTRAVENOUS | Status: DC

## 2011-10-07 MED ORDER — BISACODYL 5 MG PO TBEC: 10.0000 mg | DELAYED_RELEASE_TABLET | Freq: Every day | ORAL | Status: DC

## 2011-10-07 MED ORDER — EPINEPHRINE HCL 1 MG/ML IJ SOLN
0.5000 ug/min | INTRAVENOUS | Status: DC
  Filled 2011-10-07: qty 4

## 2011-10-07 MED ORDER — HEMOSTATIC AGENTS (NO CHARGE) OPTIME
TOPICAL | Status: DC | PRN
  Administered 2011-10-07: 1 via TOPICAL

## 2011-10-07 MED ORDER — ALBUMIN HUMAN 5 % IV SOLN
INTRAVENOUS | Status: DC | PRN
  Administered 2011-10-07 (×2): via INTRAVENOUS

## 2011-10-07 MED ORDER — POTASSIUM CHLORIDE 10 MEQ/50ML IV SOLN
10.0000 meq | INTRAVENOUS | Status: AC
  Administered 2011-10-07 – 2011-10-08 (×3): 10 meq via INTRAVENOUS

## 2011-10-07 MED ORDER — TRANEXAMIC ACID 100 MG/ML IV SOLN
1.5000 mg/kg/h | INTRAVENOUS | Status: DC
  Filled 2011-10-07: qty 25

## 2011-10-07 MED ORDER — MAGNESIUM SULFATE 50 % IJ SOLN
40.0000 meq | INTRAMUSCULAR | Status: DC
  Filled 2011-10-07: qty 10

## 2011-10-07 MED ORDER — MORPHINE SULFATE 2 MG/ML IJ SOLN: 1.0000 mg | INTRAMUSCULAR | Status: AC | PRN

## 2011-10-07 MED ORDER — DEXTROSE 5 % IV SOLN
750.0000 mg | INTRAVENOUS | Status: DC
  Filled 2011-10-07: qty 750

## 2011-10-07 MED ORDER — COAGULATION FACTOR VIIA RECOMB 1 MG IV SOLR
90.0000 ug/kg | Freq: Once | INTRAVENOUS | Status: AC
  Administered 2011-10-07: 8000 ug via INTRAVENOUS
  Filled 2011-10-07: qty 8

## 2011-10-07 MED ORDER — OXYCODONE HCL 5 MG PO TABS: 5.0000 mg | ORAL_TABLET | ORAL | Status: DC | PRN

## 2011-10-07 MED ORDER — DEXTROSE 5 % IV SOLN: 750.0000 mg | INTRAVENOUS | Status: DC

## 2011-10-07 MED ORDER — ALBUTEROL SULFATE HFA 108 (90 BASE) MCG/ACT IN AERS
INHALATION_SPRAY | RESPIRATORY_TRACT | Status: DC | PRN
  Administered 2011-10-07 (×5): 2 via RESPIRATORY_TRACT

## 2011-10-07 MED ORDER — DEXMEDETOMIDINE HCL IN NACL 200 MCG/50ML IV SOLN
0.1000 ug/kg/h | INTRAVENOUS | Status: DC
  Administered 2011-10-07 – 2011-10-08 (×3): 0.7 ug/kg/h via INTRAVENOUS
  Filled 2011-10-07 (×4): qty 50

## 2011-10-07 MED ORDER — SODIUM BICARBONATE 8.4 % IV SOLN
INTRAVENOUS | Status: DC | PRN
  Administered 2011-10-07: 12:00:00

## 2011-10-07 MED ORDER — SODIUM CHLORIDE 0.9 % IV SOLN
200.0000 ug | INTRAVENOUS | Status: DC | PRN
  Administered 2011-10-07: 0.2 ug/kg/h via INTRAVENOUS

## 2011-10-07 MED ORDER — FUROSEMIDE 10 MG/ML IJ SOLN
INTRAMUSCULAR | Status: DC | PRN
  Administered 2011-10-07: 40 mg via INTRAMUSCULAR
  Administered 2011-10-07: 120 mg via INTRAMUSCULAR

## 2011-10-07 MED ORDER — PHENYLEPHRINE HCL 10 MG/ML IJ SOLN: 30.0000 ug/min | INTRAVENOUS | Status: DC

## 2011-10-07 MED ORDER — TRANEXAMIC ACID (OHS) PUMP PRIME SOLUTION
2.0000 mg/kg | INTRAVENOUS | Status: DC
  Filled 2011-10-07: qty 1.76

## 2011-10-07 MED ORDER — ACETAMINOPHEN 500 MG PO TABS
1000.0000 mg | ORAL_TABLET | Freq: Four times a day (QID) | ORAL | Status: DC
  Filled 2011-10-07 (×4): qty 2

## 2011-10-07 MED ORDER — PANTOPRAZOLE SODIUM 40 MG PO TBEC: 40.0000 mg | DELAYED_RELEASE_TABLET | Freq: Every day | ORAL | Status: DC

## 2011-10-07 MED ORDER — ALBUMIN HUMAN 5 % IV SOLN
250.0000 mL | INTRAVENOUS | Status: AC | PRN
  Administered 2011-10-07: 250 mL via INTRAVENOUS
  Filled 2011-10-07 (×2): qty 250

## 2011-10-07 MED ORDER — ACETAMINOPHEN 160 MG/5ML PO SOLN
975.0000 mg | Freq: Four times a day (QID) | ORAL | Status: DC
  Administered 2011-10-08 (×3): 975 mg
  Filled 2011-10-07: qty 40.6

## 2011-10-07 MED ORDER — GELATIN ABSORBABLE MT POWD
OROMUCOSAL | Status: DC | PRN
  Administered 2011-10-07: 12:00:00 via TOPICAL

## 2011-10-07 MED ORDER — INSULIN REGULAR BOLUS VIA INFUSION
0.0000 [IU] | Freq: Three times a day (TID) | INTRAVENOUS | Status: DC
  Filled 2011-10-07: qty 10

## 2011-10-07 MED ORDER — DOPAMINE-DEXTROSE 1.6-5 MG/ML-% IV SOLN
INTRAVENOUS | Status: DC | PRN
  Administered 2011-10-07: 5 ug/kg/min via INTRAVENOUS

## 2011-10-07 MED ORDER — EPINEPHRINE HCL 1 MG/ML IJ SOLN
1000.0000 ug | INTRAVENOUS | Status: DC | PRN
  Administered 2011-10-07: 2 ug/min via INTRAVENOUS

## 2011-10-07 MED ORDER — SODIUM CHLORIDE 0.9 % IJ SOLN
3.0000 mL | INTRAMUSCULAR | Status: DC | PRN
  Administered 2011-10-09: 3 mL via INTRAVENOUS

## 2011-10-07 MED ORDER — PROTAMINE SULFATE 10 MG/ML IV SOLN
INTRAVENOUS | Status: DC | PRN
  Administered 2011-10-07: 50 mg via INTRAVENOUS
  Administered 2011-10-07: 40 mg via INTRAVENOUS
  Administered 2011-10-07: 10 mg via INTRAVENOUS
  Administered 2011-10-07 (×3): 50 mg via INTRAVENOUS

## 2011-10-07 MED ORDER — VENLAFAXINE HCL ER 150 MG PO CP24
150.0000 mg | ORAL_CAPSULE | Freq: Two times a day (BID) | ORAL | Status: DC
  Administered 2011-10-12 – 2011-10-23 (×17): 150 mg via ORAL
  Filled 2011-10-07 (×35): qty 1

## 2011-10-07 MED ORDER — DOCUSATE SODIUM 100 MG PO CAPS: 200.0000 mg | ORAL_CAPSULE | Freq: Every day | ORAL | Status: DC

## 2011-10-07 MED ORDER — AMIODARONE LOAD VIA INFUSION
150.0000 mg | Freq: Once | INTRAVENOUS | Status: AC
  Administered 2011-10-07: 150 mg via INTRAVENOUS
  Filled 2011-10-07: qty 83.34

## 2011-10-07 MED ORDER — METOPROLOL TARTRATE 25 MG/10 ML ORAL SUSPENSION
12.5000 mg | Freq: Two times a day (BID) | ORAL | Status: DC
  Filled 2011-10-07 (×3): qty 5

## 2011-10-07 MED ORDER — VANCOMYCIN HCL 1000 MG IV SOLR: 1500.0000 mg | INTRAVENOUS | Status: DC

## 2011-10-07 MED ORDER — SODIUM CHLORIDE 0.9 % IV SOLN
INTRAVENOUS | Status: DC
  Administered 2011-10-07: 13.2 [IU]/h via INTRAVENOUS
  Administered 2011-10-08: 2 [IU]/h via INTRAVENOUS
  Filled 2011-10-07 (×2): qty 1

## 2011-10-07 MED ORDER — SODIUM CHLORIDE 0.9 % IV SOLN
INTRAVENOUS | Status: DC | PRN
  Administered 2011-10-07: 18:00:00 via INTRAVENOUS

## 2011-10-07 MED ORDER — TRANEXAMIC ACID (OHS) PUMP PRIME SOLUTION: 2.0000 mg/kg | INTRAVENOUS | Status: DC

## 2011-10-07 MED ORDER — HEPARIN SODIUM (PORCINE) 1000 UNIT/ML IJ SOLN
INTRAMUSCULAR | Status: DC | PRN
  Administered 2011-10-07: 5000 [IU] via INTRAVENOUS
  Administered 2011-10-07: 20000 [IU] via INTRAVENOUS

## 2011-10-07 MED ORDER — NITROGLYCERIN IN D5W 200-5 MCG/ML-% IV SOLN: 0.0000 ug/min | INTRAVENOUS | Status: DC

## 2011-10-07 MED ORDER — SODIUM BICARBONATE 8.4 % IV SOLN
INTRAVENOUS | Status: DC | PRN
  Administered 2011-10-07 (×5): 50 meq via INTRAVENOUS
  Administered 2011-10-07 (×2): 100 meq via INTRAVENOUS

## 2011-10-07 MED ORDER — NITROGLYCERIN IN D5W 200-5 MCG/ML-% IV SOLN
INTRAVENOUS | Status: DC | PRN
  Administered 2011-10-07 (×2): 5 ug/min via INTRAVENOUS

## 2011-10-07 MED ORDER — DEXTROSE 5 % IV SOLN
1.5000 g | INTRAVENOUS | Status: AC
  Filled 2011-10-07: qty 1.5

## 2011-10-07 MED ORDER — DEXTROSE 5 % IV SOLN
1.5000 g | INTRAVENOUS | Status: DC | PRN
  Administered 2011-10-07: 1.5 g via INTRAVENOUS

## 2011-10-07 MED ORDER — VECURONIUM BROMIDE 10 MG IV SOLR
INTRAVENOUS | Status: DC | PRN
  Administered 2011-10-07: 10 mg via INTRAVENOUS
  Administered 2011-10-07: 7 mg via INTRAVENOUS
  Administered 2011-10-07 (×2): 10 mg via INTRAVENOUS
  Administered 2011-10-07: 3 mg via INTRAVENOUS

## 2011-10-07 MED ORDER — ACETAMINOPHEN 650 MG RE SUPP: 650.0000 mg | RECTAL | Status: AC

## 2011-10-07 MED ORDER — TRANEXAMIC ACID (OHS) BOLUS VIA INFUSION
15.0000 mg/kg | INTRAVENOUS | Status: DC
  Filled 2011-10-07: qty 1323

## 2011-10-07 MED ORDER — FAMOTIDINE IN NACL 20-0.9 MG/50ML-% IV SOLN
20.0000 mg | Freq: Two times a day (BID) | INTRAVENOUS | Status: AC
  Administered 2011-10-07 – 2011-10-08 (×2): 20 mg via INTRAVENOUS
  Filled 2011-10-07: qty 50

## 2011-10-07 MED ORDER — SODIUM CHLORIDE 0.9 % IJ SOLN
3.0000 mL | Freq: Two times a day (BID) | INTRAMUSCULAR | Status: DC
  Administered 2011-10-08: 3 mL via INTRAVENOUS

## 2011-10-07 MED ORDER — NOREPINEPHRINE BITARTRATE 1 MG/ML IJ SOLN
5.0000 ug/min | INTRAVENOUS | Status: DC
  Filled 2011-10-07 (×2): qty 8

## 2011-10-07 MED ORDER — TRANEXAMIC ACID 100 MG/ML IV SOLN
2500.0000 mg | INTRAVENOUS | Status: DC | PRN
  Administered 2011-10-07: 1.5 mg/kg/h via INTRAVENOUS

## 2011-10-07 MED ORDER — TRANEXAMIC ACID 100 MG/ML IV SOLN: 1.5000 mg/kg/h | INTRAVENOUS | Status: DC

## 2011-10-07 MED ORDER — HEMOSTATIC AGENTS (NO CHARGE) OPTIME
TOPICAL | Status: DC | PRN
  Administered 2011-10-07: 10 via TOPICAL

## 2011-10-07 MED ORDER — MORPHINE SULFATE 2 MG/ML IJ SOLN
2.0000 mg | INTRAMUSCULAR | Status: DC | PRN
  Administered 2011-10-08 (×6): 2 mg via INTRAVENOUS
  Filled 2011-10-07 (×6): qty 1

## 2011-10-07 MED ORDER — MIDAZOLAM HCL 2 MG/2ML IJ SOLN
2.0000 mg | INTRAMUSCULAR | Status: DC | PRN
  Administered 2011-10-08 (×6): 2 mg via INTRAVENOUS
  Filled 2011-10-07 (×6): qty 2

## 2011-10-07 MED ORDER — SODIUM CHLORIDE 0.9 % IV SOLN
INTRAVENOUS | Status: DC
  Administered 2011-10-07: 19:00:00 via INTRAVENOUS

## 2011-10-07 MED ORDER — LACTATED RINGERS IV SOLN
INTRAVENOUS | Status: DC
  Administered 2011-10-07: 21:00:00 via INTRAVENOUS

## 2011-10-07 MED ORDER — SUCCINYLCHOLINE CHLORIDE 20 MG/ML IJ SOLN
INTRAMUSCULAR | Status: DC | PRN
  Administered 2011-10-07: 100 mg via INTRAVENOUS

## 2011-10-07 MED ORDER — VANCOMYCIN HCL IN DEXTROSE 1-5 GM/200ML-% IV SOLN
1000.0000 mg | Freq: Once | INTRAVENOUS | Status: AC
  Administered 2011-10-07: 1000 mg via INTRAVENOUS
  Filled 2011-10-07: qty 200

## 2011-10-07 MED ORDER — SODIUM CHLORIDE 0.9 % IV SOLN: INTRAVENOUS | Status: DC

## 2011-10-07 MED ORDER — PHENYLEPHRINE HCL 10 MG/ML IJ SOLN
0.0000 ug/min | INTRAVENOUS | Status: DC
  Administered 2011-10-07: 100 ug/min via INTRAVENOUS
  Administered 2011-10-07: 30 ug/min via INTRAVENOUS
  Administered 2011-10-08: 50 ug/min via INTRAVENOUS
  Filled 2011-10-07 (×3): qty 2

## 2011-10-07 MED ORDER — PROTAMINE SULFATE 10 MG/ML IV SOLN
25.0000 mg | Freq: Once | INTRAVENOUS | Status: AC
  Administered 2011-10-07: 25 mg via INTRAVENOUS
  Filled 2011-10-07: qty 5

## 2011-10-07 MED ORDER — VANCOMYCIN HCL 1000 MG IV SOLR
1250.0000 mg | INTRAVENOUS | Status: AC
  Filled 2011-10-07: qty 1250

## 2011-10-07 MED ORDER — MAGNESIUM SULFATE 40 MG/ML IJ SOLN
4.0000 g | Freq: Once | INTRAMUSCULAR | Status: AC
  Administered 2011-10-07: 4 g via INTRAVENOUS
  Filled 2011-10-07: qty 100

## 2011-10-07 MED ORDER — PHENYLEPHRINE HCL 10 MG/ML IJ SOLN
20.0000 mg | INTRAVENOUS | Status: DC | PRN
  Administered 2011-10-07: 25 ug/min via INTRAVENOUS

## 2011-10-07 MED ORDER — DEXTROSE 50 % IV SOLN
INTRAVENOUS | Status: DC | PRN
  Administered 2011-10-07: 1 via INTRAVENOUS
  Administered 2011-10-07: .5 via INTRAVENOUS

## 2011-10-07 MED ORDER — MILRINONE IN DEXTROSE 200-5 MCG/ML-% IV SOLN
0.3000 ug/kg/min | INTRAVENOUS | Status: DC
  Administered 2011-10-08 (×2): 0.3 ug/kg/min via INTRAVENOUS
  Filled 2011-10-07 (×3): qty 100

## 2011-10-07 MED ORDER — VANCOMYCIN HCL 1000 MG IV SOLR
1500.0000 mg | INTRAVENOUS | Status: DC | PRN
  Administered 2011-10-07: 1500 mg via INTRAVENOUS

## 2011-10-07 MED ORDER — POTASSIUM CHLORIDE 2 MEQ/ML IV SOLN
80.0000 meq | INTRAVENOUS | Status: DC
  Filled 2011-10-07: qty 40

## 2011-10-07 MED ORDER — METOPROLOL TARTRATE 1 MG/ML IV SOLN: 2.5000 mg | INTRAVENOUS | Status: DC | PRN

## 2011-10-07 SURGICAL SUPPLY — 135 items
ADAPTER CARDIO PERF ANTE/RETRO (ADAPTER) ×2 IMPLANT
ADPR PRFSN 84XANTGRD RTRGD (ADAPTER) ×1
ADPR TBG 2 MALE LL ART (MISCELLANEOUS) ×1
APL SKNCLS STERI-STRIP NONHPOA (GAUZE/BANDAGES/DRESSINGS)
APPLICATOR COTTON TIP 6IN STRL (MISCELLANEOUS) ×2 IMPLANT
BAG DECANTER FOR FLEXI CONT (MISCELLANEOUS) ×2 IMPLANT
BANDAGE ESMARK 6X9 LF (GAUZE/BANDAGES/DRESSINGS) ×1 IMPLANT
BANDAGE HEMOSTAT MRDH 4X4 STRL (MISCELLANEOUS) ×1 IMPLANT
BENZOIN TINCTURE PRP APPL 2/3 (GAUZE/BANDAGES/DRESSINGS) IMPLANT
BLADE STERNUM SYSTEM 6 (BLADE) ×2 IMPLANT
BLADE SURG 15 STRL LF DISP TIS (BLADE) ×1 IMPLANT
BLADE SURG 15 STRL SS (BLADE) ×2
BLADE SURG ROTATE 9660 (MISCELLANEOUS) ×2 IMPLANT
BLOOD HAEMOCONCENTR 700 MIDI (MISCELLANEOUS) ×2 IMPLANT
BNDG CMPR 9X6 STRL LF SNTH (GAUZE/BANDAGES/DRESSINGS) ×1
BNDG ESMARK 6X9 LF (GAUZE/BANDAGES/DRESSINGS) ×2
BNDG HEMOSTAT MRDH 4X4 STRL (MISCELLANEOUS) ×2
CABLE PACING FASLOC BIEGE (MISCELLANEOUS) ×2 IMPLANT
CANISTER SUCTION 2500CC (MISCELLANEOUS) ×6 IMPLANT
CANN PRFSN .5XCNCT 15X34-48 (MISCELLANEOUS)
CANNULA ARTERIAL 007325 (MISCELLANEOUS) IMPLANT
CANNULA ARTERIAL 14F 007324 (MISCELLANEOUS) IMPLANT
CANNULA ARTERIAL 18F 007308 (MISCELLANEOUS) IMPLANT
CANNULA ARTERIAL 20F L7309 (MISCELLANEOUS) IMPLANT
CANNULA ARTERIAL 22F 007310 (MISCELLANEOUS) IMPLANT
CANNULA ARTERIAL 24F 007311 (MISCELLANEOUS) IMPLANT
CANNULA OPTISITE PERFUSION 22F (CANNULA) ×2 IMPLANT
CANNULA PRFSN .5XCNCT 15X34-48 (MISCELLANEOUS) IMPLANT
CANNULA VEN 2 STAGE (MISCELLANEOUS)
CATH HEART VENT LEFT (CATHETERS) ×1 IMPLANT
CATH THORACIC 28FR (CATHETERS) ×2 IMPLANT
CATH THORACIC 36FR RT ANG (CATHETERS) ×2 IMPLANT
CAUTERY EYE LOW TEMP 1300F FIN (OPHTHALMIC RELATED) ×2 IMPLANT
CLIP TI MEDIUM 6 (CLIP) ×2 IMPLANT
CLIP TI WIDE RED SMALL 24 (CLIP) IMPLANT
CLOTH BEACON ORANGE TIMEOUT ST (SAFETY) ×2 IMPLANT
CONN 1/2X1/2X1/2  BEN (MISCELLANEOUS)
CONN 1/2X1/2X1/2 BEN (MISCELLANEOUS) IMPLANT
CONN 3/8X3/8 GISH STERILE (MISCELLANEOUS) IMPLANT
CONN Y 3/8X3/8X3/8  BEN (MISCELLANEOUS)
CONN Y 3/8X3/8X3/8 BEN (MISCELLANEOUS) IMPLANT
CONNECTOR 1/2X3/8X1/2 3 WAY (MISCELLANEOUS) ×1
CONNECTOR 1/2X3/8X1/2 3WAY (MISCELLANEOUS) ×1 IMPLANT
CONT SPEC 4OZ CLIKSEAL STRL BL (MISCELLANEOUS) ×6 IMPLANT
COVER MAYO STAND STRL (DRAPES) ×2 IMPLANT
COVER SURGICAL LIGHT HANDLE (MISCELLANEOUS) ×4 IMPLANT
CRADLE DONUT ADULT HEAD (MISCELLANEOUS) IMPLANT
DRAIN CHANNEL 32F RND 10.7 FF (WOUND CARE) ×4 IMPLANT
DRAPE INCISE IOBAN 66X45 STRL (DRAPES) ×2 IMPLANT
DRESSING OPSITE X SMALL 2X3 (GAUZE/BANDAGES/DRESSINGS) ×2 IMPLANT
DRSG COVADERM 4X14 (GAUZE/BANDAGES/DRESSINGS) ×2 IMPLANT
DRSG PAD ABDOMINAL 8X10 ST (GAUZE/BANDAGES/DRESSINGS) ×2 IMPLANT
ELECT REM PT RETURN 9FT ADLT (ELECTROSURGICAL) ×4
ELECTRODE REM PT RTRN 9FT ADLT (ELECTROSURGICAL) ×2 IMPLANT
FELT TEFLON 6X6 (MISCELLANEOUS) IMPLANT
FEMORAL VENOUS CANN RAP (CANNULA) ×2 IMPLANT
GLOVE BIO SURGEON STRL SZ 6 (GLOVE) ×4 IMPLANT
GLOVE BIO SURGEON STRL SZ 6.5 (GLOVE) ×2 IMPLANT
GLOVE BIO SURGEON STRL SZ8 (GLOVE) ×4 IMPLANT
GLOVE BIOGEL PI IND STRL 6 (GLOVE) ×4 IMPLANT
GLOVE BIOGEL PI IND STRL 6.5 (GLOVE) ×6 IMPLANT
GLOVE BIOGEL PI IND STRL 7.0 (GLOVE) ×2 IMPLANT
GLOVE BIOGEL PI INDICATOR 6 (GLOVE) ×4
GLOVE BIOGEL PI INDICATOR 6.5 (GLOVE) ×6
GLOVE BIOGEL PI INDICATOR 7.0 (GLOVE) ×2
GLOVE EUDERMIC 7 POWDERFREE (GLOVE) ×4 IMPLANT
GOWN PREVENTION PLUS XLARGE (GOWN DISPOSABLE) ×2 IMPLANT
GOWN STRL NON-REIN LRG LVL3 (GOWN DISPOSABLE) ×14 IMPLANT
GRAFT HEMASHIELD 8MM (Vascular Products) ×2 IMPLANT
GRAFT VASC STRG 30X8KNIT (Vascular Products) ×1 IMPLANT
GRAFT WOVEN D/V 28DX30L (Vascular Products) ×2 IMPLANT
HEMOSTAT SURGICEL 2X14 (HEMOSTASIS) ×20 IMPLANT
INSERT FOGARTY SM (MISCELLANEOUS) ×2 IMPLANT
IV ADAPTER SYR DOUBLE MALE LL (MISCELLANEOUS) ×2 IMPLANT
KIT BASIN OR (CUSTOM PROCEDURE TRAY) ×2 IMPLANT
KIT DILATOR VASC 18G NDL (KITS) ×4 IMPLANT
KIT DRAINAGE VACCUM ASSIST (KITS) ×2 IMPLANT
KIT PAIN CUSTOM (MISCELLANEOUS) ×2 IMPLANT
KIT ROOM TURNOVER OR (KITS) ×2 IMPLANT
KIT SUCTION CATH 14FR (SUCTIONS) IMPLANT
LEAD PACING MYOCARDI (MISCELLANEOUS) ×2 IMPLANT
LINE EXTENSION DELIVERY (MISCELLANEOUS) ×2 IMPLANT
LINE VENT (MISCELLANEOUS) ×2 IMPLANT
LOOP VESSEL MINI RED (MISCELLANEOUS) ×2 IMPLANT
NEEDLE AORTIC AIR ASPIRATING (NEEDLE) IMPLANT
NS IRRIG 1000ML POUR BTL (IV SOLUTION) ×12 IMPLANT
PACK OPEN HEART (CUSTOM PROCEDURE TRAY) ×2 IMPLANT
PAD ARMBOARD 7.5X6 YLW CONV (MISCELLANEOUS) ×4 IMPLANT
SEALANT SURG COSEAL 4ML (VASCULAR PRODUCTS) ×2 IMPLANT
SEALANT SURG COSEAL 8ML (VASCULAR PRODUCTS) ×4 IMPLANT
SET CARDIOPLEGIA MPS 5001102 (MISCELLANEOUS) ×2 IMPLANT
SPONGE GAUZE 4X4 12PLY (GAUZE/BANDAGES/DRESSINGS) ×2 IMPLANT
SPONGE LAP 18X18 X RAY DECT (DISPOSABLE) ×2 IMPLANT
SPONGE LAP 4X18 X RAY DECT (DISPOSABLE) IMPLANT
STAPLER VISISTAT 35W (STAPLE) ×2 IMPLANT
STOPCOCK 4 WAY LG BORE MALE ST (IV SETS) ×2 IMPLANT
STRIP CLOSURE SKIN 1/2X4 (GAUZE/BANDAGES/DRESSINGS) IMPLANT
SUCKER INTRACARDIAC WEIGHTED (SUCKER) ×2 IMPLANT
SUT MNCRL AB 3-0 PS2 18 (SUTURE) IMPLANT
SUT PROLENE 3 0 RB 1 (SUTURE) ×2 IMPLANT
SUT PROLENE 3 0 SH DA (SUTURE) ×6 IMPLANT
SUT PROLENE 4 0 RB 1 (SUTURE) ×10
SUT PROLENE 4 0 SH DA (SUTURE) ×18 IMPLANT
SUT PROLENE 4-0 RB1 .5 CRCL 36 (SUTURE) ×5 IMPLANT
SUT PROLENE 5 0 C 1 36 (SUTURE) ×8 IMPLANT
SUT PROLENE 6 0 CC (SUTURE) IMPLANT
SUT PROLENE 7 0 BV 1 (SUTURE) IMPLANT
SUT PROLENE 7 0 BV1 MDA (SUTURE) IMPLANT
SUT SILK  1 MH (SUTURE) ×7
SUT SILK 1 MH (SUTURE) ×7 IMPLANT
SUT STEEL STERNAL CCS#1 18IN (SUTURE) IMPLANT
SUT STEEL SZ 6 DBL 3X14 BALL (SUTURE) IMPLANT
SUT VIC AB 1 CT1 18XCR BRD 8 (SUTURE) IMPLANT
SUT VIC AB 1 CT1 8-18 (SUTURE)
SUT VIC AB 1 CTX 27 (SUTURE) ×4 IMPLANT
SUT VIC AB 2-0 CT1 27 (SUTURE)
SUT VIC AB 2-0 CT1 TAPERPNT 27 (SUTURE) IMPLANT
SUT VIC AB 2-0 CTX 36 (SUTURE) ×8 IMPLANT
SUT VIC AB 2-0 UR6 27 (SUTURE) ×2 IMPLANT
SUT VIC AB 3-0 SH 27 (SUTURE)
SUT VIC AB 3-0 SH 27X BRD (SUTURE) IMPLANT
SUT VIC AB 3-0 X1 27 (SUTURE) ×6 IMPLANT
SUT VICRYL 4-0 PS2 18IN ABS (SUTURE) IMPLANT
SUTURE E-PAK OPEN HEART (SUTURE) ×2 IMPLANT
SYSTEM SAHARA CHEST DRAIN ATS (WOUND CARE) ×2 IMPLANT
TAPE CLOTH SURG 4X10 WHT LF (GAUZE/BANDAGES/DRESSINGS) ×4 IMPLANT
TOWEL OR 17X24 6PK STRL BLUE (TOWEL DISPOSABLE) ×2 IMPLANT
TOWEL OR 17X26 10 PK STRL BLUE (TOWEL DISPOSABLE) ×2 IMPLANT
TRAY CATH LUMEN 1 20CM STRL (SET/KITS/TRAYS/PACK) ×2 IMPLANT
TRAY FOLEY IC TEMP SENS 14FR (CATHETERS) ×2 IMPLANT
TUBE CONNECTING 12X1/4 (SUCTIONS) ×4 IMPLANT
TUBING ART PRESS 48 MALE/FEM (TUBING) ×4 IMPLANT
VENT LEFT HEART 12002 (CATHETERS) ×2
WATER STERILE IRR 1000ML POUR (IV SOLUTION) ×4 IMPLANT
YANKAUER SUCT BULB TIP NO VENT (SUCTIONS) ×6 IMPLANT

## 2011-10-07 NOTE — Anesthesia Preprocedure Evaluation (Addendum)
Anesthesia Evaluation  Patient identified by MRN, date of birth, ID band Patient awake    Reviewed: Allergy & Precautions, H&P , NPO status , Patient's Chart, lab work & pertinent test results, reviewed documented beta blocker date and time   History of Anesthesia Complications Negative for: history of anesthetic complications  Airway Mallampati: II TM Distance: >3 FB Neck ROM: Full    Dental  (+) Dental Advisory Given   Pulmonary shortness of breath and at rest,   Decreased breath sounds on the left side  + decreased breath sounds      Cardiovascular + CAD and + Past MI + dysrhythmias + Valvular Problems/Murmurs Rhythm:Regular Rate:Normal     Neuro/Psych Seizures -,  PSYCHIATRIC DISORDERS  Neuromuscular disease CVA    GI/Hepatic GERD-  ,  Endo/Other    Renal/GU      Musculoskeletal   Abdominal   Peds  Hematology   Anesthesia Other Findings   Reproductive/Obstetrics                          Anesthesia Physical Anesthesia Plan  ASA: IV and Emergent  Anesthesia Plan: General   Post-op Pain Management:    Induction: Intravenous  Airway Management Planned: Oral ETT  Additional Equipment: Arterial line, CVP, PA Cath and TEE  Intra-op Plan:   Post-operative Plan: Post-operative intubation/ventilation  Informed Consent:   Plan Discussed with: CRNA, Anesthesiologist and Surgeon  Anesthesia Plan Comments:        Anesthesia Quick Evaluation

## 2011-10-07 NOTE — Progress Notes (Signed)
  Amiodarone Drug - Drug Interaction Consult Note  Recommendations: 1. Recommend reducing Simvastatin 20 mg daily due to risk of myopathy with the interaction with amiodarone.  2. Recommend monitoring of QTc interval while on Amiodarone and Effexor XR.  3. Monitor for bradycardia as increased risk with both beta blockers and amiodarone.   Amiodarone is metabolized by the cytochrome P450 system and therefore has the potential to cause many drug interactions. Amiodarone has an average plasma half-life of 50 days (range 20 to 100 days).   There is potential for drug interactions to occur several weeks or months after stopping treatment and the onset of drug interactions may be slow after initiating amiodarone.   [x]  Statins: Increased risk of myopathy. Simvastatin- restrict dose to 20mg  daily. Other statins: counsel patients to report any muscle pain or weakness immediately.  []  Anticoagulants: Amiodarone can increase anticoagulant effect. Consider warfarin dose reduction. Patients should be monitored closely and the dose of anticoagulant altered accordingly, remembering that amiodarone levels take several weeks to stabilize.  []  Antiepileptics: Amiodarone can increase plasma concentration of phenytoin, phenytoin dose should be reduced. Note that small changes in phenytoin dose can result in large changes in phenytoin levels. Monitor patient closely and counsel on signs of toxicity.  [x]  Beta blockers: increased risk of bradycardia, AV block and myocardial depression. Sotalol - avoid concomitant use.  []   Calcium channel blockers (diltiazem and verapamil): increased risk of bradycardia, AV block and myocardial depression.  []   Cyclosporine: Amiodarone increases levels of cyclosporine. Reduced dose of cyclosporine is recommended.  []  Digoxin dose should be halved when amiodarone is started.  []  Diuretics: increased risk of cardiotoxicity if hypokalemia occurs.  []  Oral hypoglycemic agents  (glyburide, glipizide, glimepiride): increased risk of hypoglycemia. Patient's glucose levels should be monitored closely when initiating amiodarone therapy.   [x]  Drugs that prolong the QT interval: Concurrent therapy is contraindicated due to the increased risk of torsades de pointes; . Antibiotics: e.g. fluoroquinolones, erythromycin. . Antiarrhythmics: e.g. quinidine, procainamide, disopyramide, sotalol. . Antipsychotics: e.g. phenothiazines, haloperidol.  . Lithium, tricyclic antidepressants, and methadone. Thank You,  Fayne Norrie  10/07/2011 9:25 PM

## 2011-10-07 NOTE — Op Note (Signed)
NAMEMACKLEN, [name] NO.:  [id number]  MEDICAL RECORD NO.:  [id number]  LOCATION:  2316                         FACILITY:  [facility]  PHYSICIAN:  Salvatore Decent. 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  PROCEDURE:  Emergency resection and grafting of ascending aorta for repair of a contained ruptured ascending aortic pseudoaneurysm with a 28- mm Hemashield graft via redo median sternotomy.  SURGEON:  Salvatore Decent. Dorris Fetch, MD  ASSISTANT:  Lowella Dandy, PA-C  ANESTHESIA:  General.  FINDINGS:  The patient was in shock on arrival.  Transesophageal echocardiography revealed good function of the prosthetic valve with no perivalvular leaks.  There was a large mediastinal hematoma.  Extensive intrapericardial adhesions, right ventricular hypokinesis preoperatively, tear in aorta distal to the prior aortotomy site in the vicinity of a pledgeted suture, extensive clot and hematoma, question possible infection.  Postbypass, severe coagulopathy, anemia and thrombocytopenia, preserved left ventricular function, poor right ventricular function.  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  He presented to [facility] today after experiencing a syncopal spell, although he had been feeling poorly for several days prior to presentation.  He had an extensive workup, which revealed on CT scan a contained rupture of an ascending aortic pseudoaneurysm.  The patient was in shock at [facility] and was transferred emergently to [facility].  He was brought directly to the operating room and was in  early shock on arrival, although he was conscious and oriented.  I discussed with the patient that he needed emergent repair of his ascending aorta. He understood that this was a high risk procedure, but accepted the risks and agreed to proceed.  OPERATIVE NOTE:  [name] had been brought directly to the operating Room. The Anesthesia Service placed a Swan-Ganz catheter and an arterial blood pressure monitoring line.  Of note, the patient was hypotensive with systolic pressure in the 80s, although he still was alert and oriented.  His extremities were cool, consistent with hypovolemic and/or cardiogenic shock.  He was anesthetized and intubated.  A Foley catheter was placed.  Transesophageal echocardiography was performed. It revealed good left ventricular function.  There was severe right ventricular dilatation and hypokinesis.  This was felt probably to be due to compression on the pulmonary artery, which was clearly visible on the CT of the chest done at [facility].  The prosthetic valve showed good function with both leaflets moving freely and no perivalvular leaks.  The valve was well seated in the anulus with no rocking or movement noted.  The chest, abdomen, and legs were prepped and draped in usual sterile fashion.  An incision was made in the right deltopectoral groove.  The axillary artery was dissected out, it was relatively small. It did have a good pulse.  5000 units of heparin was administered.  Clamps were placed proximally and distally on the axillary artery.  An arteriotomy was made and an 8-mm Hemashield graft was sewn to the axillary artery with a running 5-0 Prolene suture.  The clamps were released and the graft was de-aired.  A 24-French femoral arterial cannula then was placed into the graft and connected to the bypass circuit.  Next, the right femoral vein was accessed percutaneously using the Seldinger technique, a wire was passed, it was visualized going into  the right atrium by echo.  The tract was sequentially dilated, and a 25-French venous cannula was advanced into the right atrium, and was connected to the bypass Circuit. The full anticoagulation dose of heparin then was given.  After confirming adequate anticoagulation with ACT measurement, cardiopulmonary bypass was commenced.  Flows were maintained per protocol.  The patient was cooled to 32 degrees Celsius.  The previous median sternotomy scar was excised.  The subcutaneous tissue was divided.  The previous wires were cut and removed.  A redo median sternotomy was performed with an oscillating saw.  The tissue was dissected off the posterior aspect of the sternum to allow placement of the retractor, which was then placed. Dissection then was begun along the diaphragmatic surface of the heart. There were extensive adhesions in the pericardium and there was some inflammatory changes of the pericardium as well with being thickened. Once the diaphragmatic surface had totally freed, the dissection was carried up again using sharp dissection anteriorly where the pericardium had been closed, this was divided over the right ventricle, but not carried up over the aorta at this point.  Next, the right atrium was dissected out and it came off fairly easily.  At this point, the pericardium was divided up over the ascending aorta. There was extensive clot in the area. While attempting to remove just enough clot to allow visualization for placement of the aortic root Vent, the clot was disturbed and there was significant arterial bleeding, this was controlled with digital pressure.  A retrograde cardioplegic cannula was placed via pursestring suture in the right atrium and directed into the coronary sinus.  A left ventricular vent was placed via pursestring suture in the right superior pulmonary vein. Continuing to hold pressure on the aorta, the aorta was dissected out more distally to allow  placement of crossclamp.  The aorta was crossclamped.  Cardiac arrest was initially achieved with cold retrograde blood cardioplegia and topical iced saline.  There was good flow through the retrograde cannula.  One liter of cardioplegia was administered retrograde.  There was a good diastolic arrest and the myocardial septal temperature fell to 12 degrees Celsius.  The aorta then was incised just distal to the previous aortotomy closure and antegrade cardioplegia was given directly into the coronary ostia with 400 mL given via the left main ostia with additional septal cooling to less than 10 degrees Celsius and then 200 mL of cardioplegia was administered via the right coronary ostia, although this was difficult to visualize.  The valve was inspected, it was clearly in good position. Both leaflets moved freely.  There was no evidence of infection, and there was nothing abnormal appearing about the prosthetic valve.  Next, attention was turned to the aorta more distally and there was a tear in the aorta distal to the previous suture line.  The aorta was transected proximally at the level of the previous aortotomy and distally just proximal to the crossclamp.  The aorta sized for a 28-mm Hemashield graft.  Additional  cardioplegia was administered at 15-20 minutes intervals during the crossclamp portion of the procedure.  The proximal aorta was wrapped With a Teflon felt strip and the Hemashield graft was anastomosed end-to- side with a running 4-0 Prolene suture.  At completion of the anastomosis, CoSeal was applied.  The graft then was cut to length and the distal anastomosis was likewise performed with a running 4-0 Prolene suture again with a Teflon felt buttress on the aortic side.  It should be noted there was no evidence of dissection of the aorta, although, there was extensive adventitial hematoma in the aorta. The distal anastomosis again was performed with a running 4-0  Prolene suture.  The anastomosis was completed.  The patient was placed in Trendelenburg position, a warm dose of retrograde cardioplegia was administered.  The patient was placed in Trendelenburg position.  The left ventricular vent was turned off.  The lungs were inflated and de- airing maneuvers were performed prior to releasing the aortic cross clamp.  The total crossclamp time was 47 minutes.  Both proximal and distal anastomoses were inspected for hemostasis.  Additional 4-0 Prolene pledgeted sutures were used where required to control bleeding.  After removal of the crossclamp he was in complete heart block and remained in complete heart block.  He was noted to be hyperkalemic. Epicardial pacing wires were placed on the right ventricle and right atrium, but even with pacing, there was relatively minimal myocardial movement.  A dopamine infusion was started at 5 mcg/kg/minute.  After administering insulin and D50 as well as bicarbonate, there was noted to be return of cardiac contractility.  It should be noted there was extensive bleeding from the pericardial and epicardial surfaces as well as all surfaces consistent with coagulopathy.  The patient did have thrombocytopenia as well as having been fully anticoagulate with Coumadin, prior to surgery. FFP and platelets were prepared.  When the patient had warmed to 37 degrees Celsius, a trial was made to wean from bypass with dopamine infusion at 5 mcg/kg/minute.  It was obvious that this attempt was going to fail, due to right ventricular hypokinesis and dilatation.  There was good left ventricular function by echocardiography.  Milrinone was started, the patient was initially loaded and then an infusion was begun at 0.3 mcg/kg/minute.  After the hyperkalemia had improved, the patient was then weaned from cardiopulmonary bypass.  He was DDD paced at 90 beats per minute and on the drips as noted.  Total bypass time was 193 minutes.   Initially, his cardiac index was approximately 1.7 liters/minute/meter squared, but did improve with resuscitation while the patient remained very hemodynamically unstable particularly in the early postbypass period.  FFP and platelets were administered; however, they were continued to be ongoing coagulopathic bleeding and the patient required continued resuscitation with packed red blood cells, fresh frozen plasma, platelets and cryoprecipitate.  He also was given NovoSeven ultimately because of ongoing coagulopathic bleeding.  The femoral venous cannula was removed and direct pressure held on the site for 30 minutes. The hemashield conduit to the axillary artery was divided near the anastamosis and oversewn with a 5-0 Prolene suture. The incision was later closed with a 2-0 Vicryl subcutaneous suture and a 3-0 Vicryl subcuticular suture.  Postbypass transesophageal echocardiography revealed good function of the prosthetic valve with preserved left ventricular function,  although there was some septal dyskinesis from pacing.  There was continued right ventricular hypokinesis.  Despite continued resuscitation with blood products, the patient continued to have coagulopathic bleeding.  At this point, it was felt there were no further surgical measures to take.  Two mediastinal and one pleural chest tubes were placed through separate subcostal incisions and secured with #1 silk sutures.  The sternum was closed with interrupted heavy-gauge stainless steel wires, but after closing the sternum, there was an immediate and rapid drop in systolic pressure as well as an increase in pulmonary arterial pressures.  The sternal wires were removed and hemodynamics allowed to stabilize while the patient was started on an epinephrine drip to help stabilize the hemodynamics.  At this point, the chest could not be closed and Esmarch was sutured to the skin with running 3-0 Prolene sutures.  The patient at  this point was still in critical condition and still overall unstable, but was relatively more stable.  He was observed in the operating room for approximately an hour and continued to show signs of gradual improvemnet.  He then was transported from the operating room to the surgical intensive care unit in critical condition.     Salvatore Decent Dorris Fetch, M.D.     SCH/MEDQ  D:  10/07/2011  T:  10/07/2011  Job:  [id number]

## 2011-10-07 NOTE — Anesthesia Procedure Notes (Signed)
Procedure Name: Intubation Date/Time: 10/07/2011 10:37 AM Performed by: Elon Alas Pre-anesthesia Checklist: Patient identified, Timeout performed, Emergency Drugs available, Suction available and Patient being monitored Patient Re-evaluated:Patient Re-evaluated prior to inductionOxygen Delivery Method: Circle system utilized Preoxygenation: Pre-oxygenation with 100% oxygen Intubation Type: IV induction, Cricoid Pressure applied and Rapid sequence Laryngoscope Size: Mac and 4 Grade View: Grade I Tube type: Oral Tube size: 8.0 mm Number of attempts: 1 Airway Equipment and Method: Stylet Placement Confirmation: positive ETCO2,  ETT inserted through vocal cords under direct vision and breath sounds checked- equal and bilateral Secured at: 21 cm Tube secured with: Tape Dental Injury: Teeth and Oropharynx as per pre-operative assessment

## 2011-10-07 NOTE — Preoperative (Signed)
Beta Blockers   Reason not to administer Beta Blockers:Hold beta blocker due to hypotension 

## 2011-10-07 NOTE — Brief Op Note (Addendum)
10/07/2011  2:24 PM  PATIENT:  [name]  61 y.o. male  PRE-OPERATIVE DIAGNOSIS: Contained Rupture of Aortic Pseudoaneurysm, shock  POST-OPERATIVE DIAGNOSIS: Contained Rupture of Aortic Pseudoaneurysm,shock  PROCEDURE:  Procedure(s) (LRB):  REDO STERNOTOMY (N/A)  RESECTION AND GRAFTING OF ASCENDING AORTA WITH 28 mm HEMASHIELD GRAFT FOR REPAIR OF CONTAINED RUPTURE OF ASCENDING AORTIC PSEUDOANEURYSM  DRAINAGE OF LEFT PLEURAL EFFUSION  SURGEON:  Surgeon(s) and Role:    * Loreli Slot, MD - Primary  PHYSICIAN ASSISTANT: Lowella Dandy PA-C  ANESTHESIA:   general  EBL:  Total I/O In: 1000 [I.V.:1000] Out: 430 [Urine:430]  BLOOD ADMINISTERED:  4 units PRBC     10 units FFP     2 bags of Platelets     1 bag of cryoprecipitate  DRAINS: medistinal chest drain, left pleural chest tube   LOCAL MEDICATIONS USED:  NONE  SPECIMEN:  Source of Specimen:  ascending aorta, mediastinal clot  DISPOSITION OF SPECIMEN:  PATHOLOGY, microbiology  COUNTS:  YES  PLAN OF CARE: Admit to inpatient   PATIENT DISPOSITION:  TO ICU hemodynamically unstable and critical condition   Delay start of Pharmacological VTE agent (>24hrs) due to surgical blood loss or risk of bleeding: yes  XC= 47 min' CPB= 193 min OFF CPB  On dopamine and milrinone, epi subsequently started

## 2011-10-07 NOTE — Progress Notes (Signed)
Stable on pressors and Fio2 100% Bleeding is sig better Repeat CXR shows clear lung fields w/o increase in cardiac size Urine output and acid base also improved  Po2 remains about 50 on PEEP 10- will try recruitment and airway irrigation

## 2011-10-07 NOTE — Transfer of Care (Addendum)
Immediate Anesthesia Transfer of Care Note  Patient: [name]  Procedure(s) Performed: Procedure(s) (LRB): STERNOTOMY (N/A) THORACIC ASCENDING ANEURYSM REPAIR (AAA) (N/A)  Patient Location: SICU  Anesthesia Type: General  Level of Consciousness: Patient remains intubated per anesthesia plan  Airway & Oxygen Therapy: Patient remains intubated per anesthesia plan and Patient placed on Ventilator (see vital sign flow sheet for setting)  Post-op Assessment: Post -op Vital signs reviewed and unstable, anesthesiologist aware  Post vital signs: Reviewed  Complications: No apparent anesthesia complications

## 2011-10-08 ENCOUNTER — Encounter (HOSPITAL_COMMUNITY): Payer: Self-pay

## 2011-10-08 ENCOUNTER — Inpatient Hospital Stay (HOSPITAL_COMMUNITY): Payer: Medicare Other

## 2011-10-08 ENCOUNTER — Encounter (HOSPITAL_COMMUNITY)
Admission: EM | Disposition: A | Discharge status: Home / Self Care | Payer: Self-pay | Source: Other Acute Inpatient Hospital | Attending: Thoracic Surgery (Cardiothoracic Vascular Surgery)

## 2011-10-08 ENCOUNTER — Encounter (HOSPITAL_COMMUNITY): Payer: Self-pay | Admitting: Thoracic Surgery (Cardiothoracic Vascular Surgery)

## 2011-10-08 DIAGNOSIS — I711 Thoracic aortic aneurysm, ruptured: Secondary | ICD-10-CM

## 2011-10-08 HISTORY — PX: MEDIASTINAL EXPLORATION: SHX5065

## 2011-10-08 LAB — BASIC METABOLIC PANEL
BUN: 31 mg/dL — ABNORMAL HIGH (ref 6–23)
CO2: 32 mEq/L (ref 19–32)
Chloride: 102 mEq/L (ref 96–112)
GFR calc Af Amer: 61 mL/min — ABNORMAL LOW (ref >90)
Glucose, Bld: 98 mg/dL (ref 70–99)
Potassium: 2.2 mEq/L — CL (ref 3.5–5.1)
Potassium: 3.7 mEq/L (ref 3.5–5.1)
Sodium: 145 mEq/L (ref 135–145)

## 2011-10-08 LAB — POCT I-STAT 3, ART BLOOD GAS (G3+)
Acid-Base Excess: 9 mmol/L — ABNORMAL HIGH (ref 0.0–2.0)
Bicarbonate: 31.9 mEq/L — ABNORMAL HIGH (ref 20.0–24.0)
O2 Saturation: 91 %
TCO2: 33 mmol/L (ref 0–100)
pO2, Arterial: 49 mmHg — ABNORMAL LOW (ref 80.0–100.0)

## 2011-10-08 LAB — CBC
HCT: 27.6 % — ABNORMAL LOW (ref 39.0–52.0)
HCT: 27.7 % — ABNORMAL LOW (ref 39.0–52.0)
Hemoglobin: 9.8 g/dL — ABNORMAL LOW (ref 13.0–17.0)
Hemoglobin: 9.8 g/dL — ABNORMAL LOW (ref 13.0–17.0)
MCH: 29.4 pg (ref 26.0–34.0)
MCH: 29.5 pg (ref 26.0–34.0)
MCHC: 35.4 g/dL (ref 30.0–36.0)
MCV: 83.4 fL (ref 78.0–100.0)
RBC: 3.33 MIL/uL — ABNORMAL LOW (ref 4.22–5.81)

## 2011-10-08 LAB — GLUCOSE, CAPILLARY
Glucose-Capillary: 116 mg/dL — ABNORMAL HIGH (ref 70–99)
Glucose-Capillary: 138 mg/dL — ABNORMAL HIGH (ref 70–99)
Glucose-Capillary: 79 mg/dL (ref 70–99)
Glucose-Capillary: 82 mg/dL (ref 70–99)
Glucose-Capillary: 96 mg/dL (ref 70–99)
Glucose-Capillary: 111 mg/dL — ABNORMAL HIGH (ref 70–99)
Glucose-Capillary: 132 mg/dL — ABNORMAL HIGH (ref 70–99)
Glucose-Capillary: 127 mg/dL — ABNORMAL HIGH (ref 70–99)
Glucose-Capillary: 129 mg/dL — ABNORMAL HIGH (ref 70–99)
Glucose-Capillary: 134 mg/dL — ABNORMAL HIGH (ref 70–99)
Glucose-Capillary: 141 mg/dL — ABNORMAL HIGH (ref 70–99)

## 2011-10-08 LAB — PREPARE PLATELET PHERESIS: Unit division: 0

## 2011-10-08 LAB — POCT I-STAT 4, (NA,K, GLUC, HGB,HCT)
Glucose, Bld: 123 mg/dL — ABNORMAL HIGH (ref 70–99)
HCT: 34 % — ABNORMAL LOW (ref 39.0–52.0)

## 2011-10-08 LAB — CARDIAC PANEL(CRET KIN+CKTOT+MB+TROPI)
CK, MB: 252.5 ng/mL (ref 0.3–4.0)
Relative Index: 3.3 — ABNORMAL HIGH (ref 0.0–2.5)
Total CK: 7597 U/L — ABNORMAL HIGH (ref 7–232)
Troponin I: >20 ng/mL (ref <0.30)

## 2011-10-08 LAB — PROTIME-INR
INR: 1.79 — ABNORMAL HIGH (ref 0.00–1.49)
Prothrombin Time: 21.1 seconds — ABNORMAL HIGH (ref 11.6–15.2)

## 2011-10-08 SURGERY — EXPLORATION, MEDIASTINUM
Anesthesia: General | Site: Chest | Wound class: Clean Contaminated

## 2011-10-08 MED ORDER — CHLORHEXIDINE GLUCONATE 0.12 % MT SOLN
OROMUCOSAL | Status: AC
  Administered 2011-10-08: 15 mL
  Filled 2011-10-08: qty 15

## 2011-10-08 MED ORDER — DOPAMINE-DEXTROSE 3.2-5 MG/ML-% IV SOLN
0.0000 ug/kg/min | INTRAVENOUS | Status: DC
  Administered 2011-10-08: 10 ug/kg/min via INTRAVENOUS
  Filled 2011-10-08: qty 250

## 2011-10-08 MED ORDER — POTASSIUM CHLORIDE 10 MEQ/50ML IV SOLN: 10.0000 meq | INTRAVENOUS | Status: DC

## 2011-10-08 MED ORDER — DEXMEDETOMIDINE HCL IN NACL 400 MCG/100ML IV SOLN
0.1000 ug/kg/h | INTRAVENOUS | Status: DC
  Administered 2011-10-09 – 2011-10-14 (×17): 0.7 ug/kg/h via INTRAVENOUS
  Filled 2011-10-08 (×24): qty 100

## 2011-10-08 MED ORDER — SODIUM CHLORIDE 0.9 % IV SOLN
INTRAVENOUS | Status: DC
  Administered 2011-10-09: 5 [IU]/h via INTRAVENOUS
  Filled 2011-10-08 (×2): qty 1

## 2011-10-08 MED ORDER — BISACODYL 5 MG PO TBEC
10.0000 mg | DELAYED_RELEASE_TABLET | Freq: Every day | ORAL | Status: DC
  Administered 2011-10-16 – 2011-10-20 (×2): 10 mg via ORAL
  Filled 2011-10-08 (×4): qty 2

## 2011-10-08 MED ORDER — MORPHINE SULFATE 2 MG/ML IJ SOLN: 1.0000 mg | INTRAMUSCULAR | Status: AC | PRN

## 2011-10-08 MED ORDER — SODIUM CHLORIDE 0.9 % IV SOLN
INTRAVENOUS | Status: DC
  Administered 2011-10-13 – 2011-10-14 (×2): via INTRAVENOUS

## 2011-10-08 MED ORDER — ALBUMIN HUMAN 5 % IV SOLN
12.5000 g | Freq: Once | INTRAVENOUS | Status: AC
  Administered 2011-10-08: 12.5 g via INTRAVENOUS

## 2011-10-08 MED ORDER — LACTATED RINGERS IV SOLN
INTRAVENOUS | Status: DC | PRN
  Administered 2011-10-08: 16:00:00 via INTRAVENOUS

## 2011-10-08 MED ORDER — EPINEPHRINE HCL 1 MG/ML IJ SOLN
0.5000 ug/min | INTRAVENOUS | Status: DC
  Administered 2011-10-08: 5 ug/min via INTRAVENOUS
  Filled 2011-10-08: qty 4

## 2011-10-08 MED ORDER — PANTOPRAZOLE SODIUM 40 MG PO TBEC
40.0000 mg | DELAYED_RELEASE_TABLET | Freq: Every day | ORAL | Status: DC
  Administered 2011-10-10 – 2011-10-11 (×2): 40 mg via ORAL
  Filled 2011-10-08 (×3): qty 1

## 2011-10-08 MED ORDER — SODIUM CHLORIDE 0.9 % IV SOLN
250.0000 mL | INTRAVENOUS | Status: DC
  Administered 2011-10-09: 13:00:00 via INTRAVENOUS

## 2011-10-08 MED ORDER — BIOTENE DRY MOUTH MT LIQD
15.0000 mL | Freq: Four times a day (QID) | OROMUCOSAL | Status: DC
  Administered 2011-10-08 – 2011-10-16 (×30): 15 mL via OROMUCOSAL

## 2011-10-08 MED ORDER — SODIUM CHLORIDE 0.9 % IJ SOLN: 3.0000 mL | INTRAMUSCULAR | Status: DC | PRN

## 2011-10-08 MED ORDER — DEXTROSE 5 % IV SOLN
1.5000 g | Freq: Two times a day (BID) | INTRAVENOUS | Status: AC
  Administered 2011-10-08 – 2011-10-10 (×4): 1.5 g via INTRAVENOUS
  Filled 2011-10-08 (×4): qty 1.5

## 2011-10-08 MED ORDER — ACETAMINOPHEN 160 MG/5ML PO SOLN
975.0000 mg | Freq: Four times a day (QID) | ORAL | Status: AC
  Administered 2011-10-09 – 2011-10-13 (×11): 975 mg
  Filled 2011-10-08 (×9): qty 40.6

## 2011-10-08 MED ORDER — FENTANYL CITRATE 0.05 MG/ML IJ SOLN
INTRAMUSCULAR | Status: DC | PRN
  Administered 2011-10-08 (×2): 100 ug via INTRAVENOUS

## 2011-10-08 MED ORDER — METOPROLOL TARTRATE 12.5 MG HALF TABLET
12.5000 mg | ORAL_TABLET | Freq: Two times a day (BID) | ORAL | Status: DC
  Filled 2011-10-08 (×9): qty 1

## 2011-10-08 MED ORDER — BISACODYL 10 MG RE SUPP
10.0000 mg | Freq: Every day | RECTAL | Status: DC
  Administered 2011-10-10 – 2011-10-15 (×6): 10 mg via RECTAL
  Filled 2011-10-08 (×6): qty 1

## 2011-10-08 MED ORDER — SODIUM CHLORIDE 0.45 % IV SOLN: INTRAVENOUS | Status: DC

## 2011-10-08 MED ORDER — SODIUM CHLORIDE 0.9 % IJ SOLN: OROMUCOSAL | Status: DC | PRN

## 2011-10-08 MED ORDER — FAMOTIDINE IN NACL 20-0.9 MG/50ML-% IV SOLN
20.0000 mg | Freq: Two times a day (BID) | INTRAVENOUS | Status: AC
  Administered 2011-10-08 – 2011-10-09 (×2): 20 mg via INTRAVENOUS
  Filled 2011-10-08 (×2): qty 50

## 2011-10-08 MED ORDER — NITROGLYCERIN IN D5W 200-5 MCG/ML-% IV SOLN: 0.0000 ug/min | INTRAVENOUS | Status: DC

## 2011-10-08 MED ORDER — FUROSEMIDE 10 MG/ML IJ SOLN
6.0000 mg/h | INTRAVENOUS | Status: DC
  Administered 2011-10-08 – 2011-10-10 (×3): 10 mg/h via INTRAVENOUS
  Administered 2011-10-12: 6 mg/h via INTRAVENOUS
  Administered 2011-10-13: 10 mg/h via INTRAVENOUS
  Administered 2011-10-14: 6 mg/h via INTRAVENOUS
  Filled 2011-10-08 (×14): qty 25

## 2011-10-08 MED ORDER — MORPHINE SULFATE 2 MG/ML IJ SOLN
2.0000 mg | INTRAMUSCULAR | Status: DC | PRN
  Administered 2011-10-08 – 2011-10-09 (×4): 2 mg via INTRAVENOUS
  Administered 2011-10-09: 4 mg via INTRAVENOUS
  Administered 2011-10-09 – 2011-10-10 (×4): 2 mg via INTRAVENOUS
  Administered 2011-10-11 (×3): 4 mg via INTRAVENOUS
  Administered 2011-10-11: 2 mg via INTRAVENOUS
  Administered 2011-10-11: 4 mg via INTRAVENOUS
  Administered 2011-10-11 – 2011-10-12 (×3): 2 mg via INTRAVENOUS
  Administered 2011-10-12 (×2): 4 mg via INTRAVENOUS
  Administered 2011-10-13 (×2): 2 mg via INTRAVENOUS
  Administered 2011-10-13: 4 mg via INTRAVENOUS
  Administered 2011-10-13 – 2011-10-15 (×5): 2 mg via INTRAVENOUS
  Filled 2011-10-08: qty 2
  Filled 2011-10-08 (×7): qty 1
  Filled 2011-10-08: qty 2
  Filled 2011-10-08: qty 1
  Filled 2011-10-08: qty 2
  Filled 2011-10-08: qty 1
  Filled 2011-10-08: qty 2
  Filled 2011-10-08 (×3): qty 1
  Filled 2011-10-08: qty 2
  Filled 2011-10-08 (×4): qty 1
  Filled 2011-10-08 (×2): qty 2
  Filled 2011-10-08: qty 1
  Filled 2011-10-08 (×2): qty 2

## 2011-10-08 MED ORDER — SODIUM CHLORIDE 0.9 % IV SOLN
100.0000 [IU] | INTRAVENOUS | Status: DC | PRN
  Administered 2011-10-08: 2 [IU]/h via INTRAVENOUS
  Administered 2011-10-08: 18:00:00 via INTRAVENOUS

## 2011-10-08 MED ORDER — ACETAMINOPHEN 650 MG RE SUPP: 650.0000 mg | RECTAL | Status: AC

## 2011-10-08 MED ORDER — METOPROLOL TARTRATE 1 MG/ML IV SOLN
2.5000 mg | INTRAVENOUS | Status: DC | PRN
Start: 2011-10-08 — End: 2011-10-21

## 2011-10-08 MED ORDER — MILRINONE IN DEXTROSE 200-5 MCG/ML-% IV SOLN
0.2000 ug/kg/min | INTRAVENOUS | Status: DC
  Administered 2011-10-09 – 2011-10-11 (×4): 0.3 ug/kg/min via INTRAVENOUS
  Administered 2011-10-12 – 2011-10-14 (×3): 0.2 ug/kg/min via INTRAVENOUS
  Filled 2011-10-08 (×11): qty 100

## 2011-10-08 MED ORDER — SODIUM CHLORIDE 0.9 % IV SOLN
200.0000 ug | INTRAVENOUS | Status: DC | PRN
  Administered 2011-10-08: 200 ug
  Administered 2011-10-08: .7 ug/kg/h via INTRAVENOUS

## 2011-10-08 MED ORDER — ALBUMIN HUMAN 5 % IV SOLN: 250.0000 mL | INTRAVENOUS | Status: AC | PRN

## 2011-10-08 MED ORDER — ACETAMINOPHEN 160 MG/5ML PO SOLN
650.0000 mg | ORAL | Status: AC
  Filled 2011-10-08: qty 40.6

## 2011-10-08 MED ORDER — MIDAZOLAM HCL 2 MG/2ML IJ SOLN
2.0000 mg | INTRAMUSCULAR | Status: DC | PRN
  Administered 2011-10-08 – 2011-10-14 (×26): 2 mg via INTRAVENOUS
  Filled 2011-10-08 (×27): qty 2

## 2011-10-08 MED ORDER — LACTATED RINGERS IV SOLN: INTRAVENOUS | Status: DC

## 2011-10-08 MED ORDER — VECURONIUM BROMIDE 10 MG IV SOLR
INTRAVENOUS | Status: DC | PRN
  Administered 2011-10-08: 10 mg via INTRAVENOUS

## 2011-10-08 MED ORDER — 0.9 % SODIUM CHLORIDE (POUR BTL) OPTIME
TOPICAL | Status: DC | PRN
  Administered 2011-10-08: 7000 mL

## 2011-10-08 MED ORDER — VANCOMYCIN HCL IN DEXTROSE 1-5 GM/200ML-% IV SOLN
1000.0000 mg | Freq: Once | INTRAVENOUS | Status: AC
  Administered 2011-10-09: 1000 mg via INTRAVENOUS
  Filled 2011-10-08: qty 200

## 2011-10-08 MED ORDER — DEXMEDETOMIDINE HCL IN NACL 200 MCG/50ML IV SOLN
0.1000 ug/kg/h | INTRAVENOUS | Status: DC
  Filled 2011-10-08: qty 50

## 2011-10-08 MED ORDER — ACETAMINOPHEN 500 MG PO TABS
1000.0000 mg | ORAL_TABLET | Freq: Four times a day (QID) | ORAL | Status: AC
  Administered 2011-10-10: 1000 mg via ORAL
  Filled 2011-10-08 (×18): qty 2

## 2011-10-08 MED ORDER — FUROSEMIDE 10 MG/ML IJ SOLN
120.0000 mg | Freq: Once | INTRAMUSCULAR | Status: AC
  Administered 2011-10-08: 120 mg via INTRAVENOUS
  Filled 2011-10-08: qty 12

## 2011-10-08 MED ORDER — CHLORHEXIDINE GLUCONATE 0.12 % MT SOLN
15.0000 mL | Freq: Two times a day (BID) | OROMUCOSAL | Status: DC
  Administered 2011-10-08 – 2011-10-15 (×16): 15 mL via OROMUCOSAL
  Filled 2011-10-08 (×15): qty 15

## 2011-10-08 MED ORDER — METOPROLOL TARTRATE 25 MG/10 ML ORAL SUSPENSION
12.5000 mg | Freq: Two times a day (BID) | ORAL | Status: DC
  Filled 2011-10-08 (×9): qty 5

## 2011-10-08 MED ORDER — ONDANSETRON HCL 4 MG/2ML IJ SOLN
4.0000 mg | Freq: Four times a day (QID) | INTRAMUSCULAR | Status: DC | PRN
  Administered 2011-10-15: 4 mg via INTRAVENOUS
  Filled 2011-10-08: qty 2

## 2011-10-08 MED ORDER — POTASSIUM CHLORIDE 10 MEQ/50ML IV SOLN
10.0000 meq | INTRAVENOUS | Status: AC
  Administered 2011-10-08 (×4): 10 meq via INTRAVENOUS
  Filled 2011-10-08: qty 100

## 2011-10-08 MED ORDER — DEXMEDETOMIDINE HCL IN NACL 400 MCG/100ML IV SOLN
0.1000 ug/kg/h | INTRAVENOUS | Status: DC
  Administered 2011-10-08 (×2): 0.7 ug/kg/h via INTRAVENOUS
  Filled 2011-10-08 (×2): qty 100

## 2011-10-08 MED ORDER — PHENYLEPHRINE HCL 10 MG/ML IJ SOLN
20.0000 mg | INTRAVENOUS | Status: DC | PRN
  Administered 2011-10-08: 50 ug/min via INTRAVENOUS
  Administered 2011-10-08: 17:00:00 via INTRAVENOUS

## 2011-10-08 MED ORDER — DOCUSATE SODIUM 100 MG PO CAPS
200.0000 mg | ORAL_CAPSULE | Freq: Every day | ORAL | Status: DC
  Administered 2011-10-16 – 2011-10-22 (×3): 200 mg via ORAL
  Filled 2011-10-08 (×6): qty 2

## 2011-10-08 MED ORDER — ASPIRIN 81 MG PO CHEW
324.0000 mg | CHEWABLE_TABLET | Freq: Every day | ORAL | Status: DC
  Administered 2011-10-09 – 2011-10-10 (×2): 324 mg
  Filled 2011-10-08 (×2): qty 4

## 2011-10-08 MED ORDER — DOPAMINE-DEXTROSE 3.2-5 MG/ML-% IV SOLN
3.0000 ug/kg/min | INTRAVENOUS | Status: DC
  Administered 2011-10-09: 10 ug/kg/min via INTRAVENOUS
  Administered 2011-10-10: 8 ug/kg/min via INTRAVENOUS
  Administered 2011-10-13 – 2011-10-14 (×2): 5 ug/kg/min via INTRAVENOUS
  Filled 2011-10-08 (×9): qty 250

## 2011-10-08 MED ORDER — OXYCODONE HCL 5 MG PO TABS
5.0000 mg | ORAL_TABLET | ORAL | Status: DC | PRN
  Administered 2011-10-16 – 2011-10-18 (×2): 5 mg via ORAL
  Administered 2011-10-19: 10 mg via ORAL
  Filled 2011-10-08 (×3): qty 1
  Filled 2011-10-08: qty 2

## 2011-10-08 MED ORDER — MIDAZOLAM HCL 5 MG/5ML IJ SOLN
INTRAMUSCULAR | Status: DC | PRN
  Administered 2011-10-08: 3 mg via INTRAVENOUS

## 2011-10-08 MED ORDER — PHENYLEPHRINE HCL 10 MG/ML IJ SOLN
0.0000 ug/min | INTRAVENOUS | Status: DC
  Filled 2011-10-08: qty 2

## 2011-10-08 MED ORDER — INSULIN REGULAR BOLUS VIA INFUSION
0.0000 [IU] | Freq: Three times a day (TID) | INTRAVENOUS | Status: DC
  Filled 2011-10-08: qty 10

## 2011-10-08 MED ORDER — ASPIRIN EC 325 MG PO TBEC
325.0000 mg | DELAYED_RELEASE_TABLET | Freq: Every day | ORAL | Status: DC
  Filled 2011-10-08 (×4): qty 1

## 2011-10-08 MED ORDER — EPINEPHRINE HCL 1 MG/ML IJ SOLN
0.0000 ug/min | INTRAVENOUS | Status: DC
  Administered 2011-10-09: 5 ug/min via INTRAVENOUS
  Administered 2011-10-09: 6 ug/min via INTRAVENOUS
  Filled 2011-10-08 (×5): qty 4

## 2011-10-08 MED ORDER — SODIUM CHLORIDE 0.9 % IJ SOLN
3.0000 mL | Freq: Two times a day (BID) | INTRAMUSCULAR | Status: DC
  Administered 2011-10-09 – 2011-10-11 (×5): 3 mL via INTRAVENOUS
  Administered 2011-10-12: 10 mL via INTRAVENOUS
  Administered 2011-10-13 – 2011-10-17 (×5): 3 mL via INTRAVENOUS
  Administered 2011-10-17: 10:00:00 via INTRAVENOUS
  Administered 2011-10-18: 3 mL via INTRAVENOUS

## 2011-10-08 MED ORDER — LACTATED RINGERS IV SOLN: 500.0000 mL | Freq: Once | INTRAVENOUS | Status: AC | PRN

## 2011-10-08 SURGICAL SUPPLY — 43 items
ATTRACTOMAT 16X20 MAGNETIC DRP (DRAPES) ×2 IMPLANT
CANISTER SUCTION 2500CC (MISCELLANEOUS) ×4 IMPLANT
CATH THORACIC 36FR (CATHETERS) ×2 IMPLANT
COVER SURGICAL LIGHT HANDLE (MISCELLANEOUS) ×2 IMPLANT
CRADLE DONUT ADULT HEAD (MISCELLANEOUS) ×2 IMPLANT
DRAPE CARDIOVASCULAR INCISE (DRAPES) ×2
DRAPE SLUSH/WARMER DISC (DRAPES) ×2 IMPLANT
DRAPE SRG 135X102X78XABS (DRAPES) ×1 IMPLANT
DRSG COVADERM 4X14 (GAUZE/BANDAGES/DRESSINGS) ×2 IMPLANT
ELECT REM PT RETURN 9FT ADLT (ELECTROSURGICAL) ×4
ELECTRODE REM PT RTRN 9FT ADLT (ELECTROSURGICAL) ×2 IMPLANT
GLOVE BIOGEL PI IND STRL 6.5 (GLOVE) ×2 IMPLANT
GLOVE BIOGEL PI IND STRL 7.0 (GLOVE) ×4 IMPLANT
GLOVE BIOGEL PI IND STRL 7.5 (GLOVE) ×1 IMPLANT
GLOVE BIOGEL PI INDICATOR 6.5 (GLOVE) ×2
GLOVE BIOGEL PI INDICATOR 7.0 (GLOVE) ×4
GLOVE BIOGEL PI INDICATOR 7.5 (GLOVE) ×1
GLOVE EUDERMIC 7 POWDERFREE (GLOVE) ×6 IMPLANT
GOWN EXTRA PROTECTION XL (GOWNS) ×2 IMPLANT
GOWN STRL NON-REIN LRG LVL3 (GOWN DISPOSABLE) ×8 IMPLANT
HEMOSTAT POWDER SURGIFOAM 1G (HEMOSTASIS) ×6 IMPLANT
HEMOSTAT SURGICEL 2X14 (HEMOSTASIS) ×2 IMPLANT
KIT BASIN OR (CUSTOM PROCEDURE TRAY) ×2 IMPLANT
KIT ROOM TURNOVER OR (KITS) ×2 IMPLANT
KIT SUCTION CATH 14FR (SUCTIONS) ×2 IMPLANT
NS IRRIG 1000ML POUR BTL (IV SOLUTION) ×14 IMPLANT
PACK OPEN HEART (CUSTOM PROCEDURE TRAY) ×2 IMPLANT
PAD ARMBOARD 7.5X6 YLW CONV (MISCELLANEOUS) ×4 IMPLANT
SPONGE GAUZE 4X4 12PLY (GAUZE/BANDAGES/DRESSINGS) ×2 IMPLANT
STAPLER VISISTAT 35W (STAPLE) ×4 IMPLANT
SUT BONE WAX W31G (SUTURE) ×2 IMPLANT
SUT PROLENE 3 0 SH DA (SUTURE) ×2 IMPLANT
SUT PROLENE 4 0 RB 1 (SUTURE) ×2
SUT PROLENE 4-0 RB1 .5 CRCL 36 (SUTURE) ×1 IMPLANT
SUT STEEL SZ 6 DBL 3X14 BALL (SUTURE) ×6 IMPLANT
SUT VIC AB 1 CTX 36 (SUTURE) ×6
SUT VIC AB 1 CTX36XBRD ANBCTR (SUTURE) ×3 IMPLANT
SUTURE E-PAK OPEN HEART (SUTURE) ×2 IMPLANT
SYSTEM SAHARA CHEST DRAIN ATS (WOUND CARE) ×2 IMPLANT
TAPE CLOTH SURG 4X10 WHT LF (GAUZE/BANDAGES/DRESSINGS) ×2 IMPLANT
TOWEL OR 17X24 6PK STRL BLUE (TOWEL DISPOSABLE) ×4 IMPLANT
TOWEL OR 17X26 10 PK STRL BLUE (TOWEL DISPOSABLE) ×4 IMPLANT
WATER STERILE IRR 1000ML POUR (IV SOLUTION) ×2 IMPLANT

## 2011-10-08 NOTE — Preoperative (Signed)
Beta Blockers   Reason not to administer Beta Blockers:Not Applicable 

## 2011-10-08 NOTE — Progress Notes (Signed)
INITIAL ADULT NUTRITION ASSESSMENT Date: 10/08/2011   Time: 2:42 PM  INTERVENTION:  If EN started, recommend Osmolite 1.5 formula at 20 ml/hr and increase by 10 ml every 4 hours to goal rate of 50 ml/hr with Prostat liquid protein 4 times daily via tube to provide 2200 total kcals, 135 gm protein, 914 ml of free water  RD to follow for nutrition care plan  Reason for Assessment: VDRF  ASSESSMENT: Male 61 y.o.  Dx: contained ruptured pseudoaneurysm of ascending aorta with shock  Hx:  Past Medical History  Diagnosis Date  . Murmur     Trace mitral regurgitation by echocardiogram February 2012  . Hyperlipidemia     takes Simvastatin nightly  . Mitral regurgitation     New murmur at the apex consistent with mitral regurgitation  . Aortic stenosis     Bicuspid aortic valve with mild-to-moderate aortic stenosis echocardiogram February 2012 mean gradient 34 mmHg, aortic valve area 0.7 cm, peak velocity 2.43 m/scomment by catheterization mean gradient 18 mmHg December 2010 status post TEE  . CAD (coronary artery disease)     native vessel, nonobstructive, by catheterization, last catheterization December 2010  . Dyslipidemia   . Fibromyalgia   . Peripheral vascular disease   . Myocardial infarction 10 yrs ago  . Shortness of breath     sitting/lying/exertion  . Bronchitis     hx of-73yrs ago  . Ringing in ears     since seizure 68yrs ago-he hit his head  . Seizures     6 yrs ago from withdrawal of xanax to quickly.  . Dizziness   . Confusion   . Short-term memory loss   . Long-term memory loss   . Stroke     behind right eye-occ sees double 12 yrs ago  . Arthritis   . Joint pain   . Pain and swelling of forearm   . Chronic back pain     lumbar spondylosis  . Dry skin   . GERD (gastroesophageal reflux disease)     takes Protonix daily  . Constipation     takes Miralax prn  . Hx of colonic polyps   . Slow urinary stream   . Nocturia   . Burning with urination   .  Major depressive disorder     wih prior suicidal attempts;takes Effexor bid  . Anxiety     takes Xanax 5 times day    Related Meds:     . acetaminophen (TYLENOL) oral liquid 160 mg/5 mL  650 mg Per Tube NOW   Or  . acetaminophen  650 mg Rectal NOW  . acetaminophen  1,000 mg Oral Q6H   Or  . acetaminophen (TYLENOL) oral liquid 160 mg/5 mL  975 mg Per Tube Q6H  . albumin human  12.5 g Intravenous Once  . albumin human  12.5 g Intravenous Once  . amiodarone  150 mg Intravenous Once  . antiseptic oral rinse  15 mL Mouth Rinse QID  . aspirin EC  325 mg Oral Daily   Or  . aspirin  324 mg Per Tube Daily  . bisacodyl  10 mg Oral Daily   Or  . bisacodyl  10 mg Rectal Daily  . calcium chloride  1 g Intravenous Once  . cefUROXime (ZINACEF)  IV  1.5 g Intravenous To OR  . cefUROXime (ZINACEF)  IV  1.5 g Intravenous Q12H  . chlorhexidine  15 mL Mouth Rinse BID  . chlorhexidine      .  coagulation factor VIIa recomb  90 mcg/kg Intravenous Once  . docusate sodium  200 mg Oral Daily  . famotidine (PEPCID) IV  20 mg Intravenous Q12H  . furosemide  120 mg Intravenous Once  . insulin regular  0-10 Units Intravenous TID WC  . magnesium sulfate  4 g Intravenous Once  . metoprolol tartrate  12.5 mg Oral BID   Or  . metoprolol tartrate  12.5 mg Per Tube BID  . pantoprazole  40 mg Oral Q1200  . potassium chloride  10 mEq Intravenous Q1 Hr x 3  . potassium chloride  10 mEq Intravenous Q1 Hr x 3  . potassium chloride  10 mEq Intravenous Q1 Hr x 4  . protamine  25 mg Intravenous Once  . sodium chloride  3 mL Intravenous Q12H  . vancomycin  1,250 mg Intravenous To OR  . vancomycin  1,000 mg Intravenous Once  . venlafaxine XR  150 mg Oral BID  . DISCONTD: dexmedetomidine  0.4-1.2 mcg/kg/hr Intravenous To PACU  . DISCONTD: insulin (NOVOLIN-R) infusion   Intravenous To PACU  . DISCONTD: phenylephrine (NEO-SYNEPHRINE) Adult infusion  30-200 mcg/min Intravenous To PACU  . DISCONTD: potassium  chloride  10 mEq Intravenous Once  . DISCONTD: potassium chloride  10 mEq Intravenous Q1 Hr x 3  . DISCONTD: simvastatin  40 mg Oral QHS  . DISCONTD: warfarin  2 mg Oral Daily    Ht: 6' (182.9 cm)  Wt: 194 lb (88.2 kg) -- admission weight  Ideal Wt: 81 kg % Ideal Wt: 108%  Usual Wt: 197 lb -- per office visit records % Usual Wt: 98%  BMI = 26.4 kg/m2 -- based on admission weight  Food/Nutrition Related Hx: admission nutrition screen incomplete  Labs:  CMP     Component Value Date/Time   NA 145 10/08/2011 0530   K 3.7 10/08/2011 0530   CL 100 10/08/2011 0530   CO2 32 10/08/2011 0530   GLUCOSE 98 10/08/2011 0530   BUN 31* 10/08/2011 0530   CREATININE 1.99* 10/08/2011 0530   CALCIUM 10.2 10/08/2011 0530   PROT 7.9 10/05/2011 1608   ALBUMIN 3.5 10/05/2011 1608   AST 17 10/05/2011 1608   ALT 22 10/05/2011 1608   ALKPHOS 97 10/05/2011 1608   BILITOT 0.6 10/05/2011 1608   GFRNONAA 35* 10/08/2011 0530   GFRAA 41* 10/08/2011 0530     Intake/Output Summary (Last 24 hours) at 10/08/11 1444 Last data filed at 10/08/11 1400  Gross per 24 hour  Intake 53696.85 ml  Output   5065 ml  Net 48631.85 ml    CBG (last 3)   Basename 10/08/11 1255 10/08/11 1152 10/08/11 1049  GLUCAP 127* 132* 115*    Diet Order: NPO  Supplements/Tube Feeding: N/A  IVF:    sodium chloride Last Rate: 20 mL/hr at 10/07/11 1845  sodium chloride Last Rate: 10 mL/hr at 10/07/11 1845  sodium chloride Last Rate: 250 mL (10/08/11 0502)  amiodarone (NEXTERONE PREMIX) 360 mg/200 mL dextrose Last Rate: 1 mg/min (10/08/11 0000)  Followed by   amiodarone (NEXTERONE PREMIX) 360 mg/200 mL dextrose Last Rate: 0.5 mg/min (10/08/11 0300)  dexmedetomidine Last Rate: 0.7 mcg/kg/hr (10/08/11 1400)  DOPamine Last Rate: Stopped (10/08/11 1000)  epinephrine Last Rate: 3 mcg/min (10/08/11 1400)  insulin (NOVOLIN-R) infusion Last Rate: 2 Units/hr (10/08/11 1610)  lactated ringers Last Rate: 20 mL/hr at 10/07/11 2127  milrinone Last Rate: 0.3  mcg/kg/min (10/08/11 1400)  nitroGLYCERIN Last Rate: Stopped (10/07/11 1845)  norepinephrine (LEVOPHED) Adult infusion  Last Rate: Stopped (10/07/11 2145)  phenylephrine (NEO-SYNEPHRINE) Adult infusion Last Rate: 50 mcg/min (10/08/11 1400)  DISCONTD: dexmedetomidine Last Rate: 0.7 mcg/kg/hr (10/08/11 0400)    Estimated Nutritional Needs:   Kcal: 2100-2200 Protein: 140-150 gm Fluid: 2.1-2.2 L  RD unable to obtain nutrition hx from patient; intubated and sedated; OGT in place; s/p emergent sternotomy & thoracic ascending aneurysm repair 7/8; noted plan to back to OR this afternoon for washout, possible chest closure; + surgical incisions; low braden score of 12 places patient at risk for skin breakdown  NUTRITION DIAGNOSIS: -Inadequate oral intake (NI-2.1).  Status: Ongoing  RELATED TO: inability to eat  AS EVIDENCE BY: NPO status  MONITORING/EVALUATION(Goals): Goal: Initiate EN support in next 24-48 hours if extubation not expected to meet >/= 90% of estimated nutrition needs Monitor: EN support initiation, respiratory status, weight, labs, I/O's  EDUCATION NEEDS: -No education needs identified at this time  Dietitian #: [phone]  DOCUMENTATION CODES Per approved criteria  -Not Applicable      Alger Memos 10/08/2011, 2:42 PM

## 2011-10-08 NOTE — Plan of Care (Signed)
Problem: Phase I Progression Outcomes Goal: Patient status is OR emergent or Short Stay Outcome: Completed/Met Date Met:  10/08/11 Patient status is OR emergent per care plan.

## 2011-10-08 NOTE — Anesthesia Preprocedure Evaluation (Signed)
Anesthesia Evaluation  Patient identified by MRN, date of birth, ID band Patient awake    Airway       Dental   Pulmonary shortness of breath,          Cardiovascular + CAD and + Past MI + dysrhythmias + Valvular Problems/Murmurs     Neuro/Psych Seizures -,  Anxiety Depression CVA    GI/Hepatic GERD-  ,  Endo/Other    Renal/GU      Musculoskeletal   Abdominal   Peds  Hematology   Anesthesia Other Findings   Reproductive/Obstetrics                           Anesthesia Physical Anesthesia Plan  ASA: IV  Anesthesia Plan: General   Post-op Pain Management:    Induction:   Airway Management Planned: Oral ETT  Additional Equipment: Arterial line, CVP and PA Cath  Intra-op Plan:   Post-operative Plan: Post-operative intubation/ventilation  Informed Consent: I have reviewed the patients History and Physical, chart, labs and discussed the procedure including the risks, benefits and alternatives for the proposed anesthesia with the patient or authorized representative who has indicated his/her understanding and acceptance.   Dental advisory given  Plan Discussed with: CRNA, Anesthesiologist and Surgeon  Anesthesia Plan Comments:         Anesthesia Quick Evaluation

## 2011-10-08 NOTE — Progress Notes (Addendum)
[facility] BRIEF SICU PROGRESS NOTE Day Post-Op Procedure(s) (LRB):  STERNOTOMY (N/A)  THORACIC ASCENDING ANEURYSM REPAIR (AAA) (N/A)  Day of Surgery  S/P Procedure(s) (LRB): MEDIASTINAL EXPLORATION (N/A)   Sedated on vent Sinus tach, BP 108/58 and fairly stable off Neo drip Epi @ 2, Dop @ 10, Milrinone @ 0.3 O2 sats 90% on 100% FiO2, PaO2 49 Remains oliguric on lasix Chest tube output low K+ 3.9  Hgb 11.6 Plt 43k INR 1.8 receiving FFP  Plan: Continue current plan.  Try PRVC vent mode.  Transfuse platelets.  Wean drips slowly as tolerated  [name] H 10/08/2011 7:11 PM

## 2011-10-08 NOTE — Progress Notes (Signed)
1 Day Post-Op Procedure(s) (LRB): STERNOTOMY (N/A) THORACIC ASCENDING ANEURYSM REPAIR (AAA) (N/A) Subjective: Intubated and sedated  Objective: Vital signs in last 24 hours: Temp:  [96.1 F (35.6 C)-99.5 F (37.5 C)] 99.5 F (37.5 C) (07/09 0737) Pulse Rate:  [71-144] 117  (07/09 0700) Cardiac Rhythm:  [-] Sinus tachycardia (07/09 0400) Resp:  [12-19] 12  (07/09 0700) BP: (70-144)/(33-78) 99/69 mmHg (07/09 0700) SpO2:  [89 %-100 %] 98 % (07/09 0700) Arterial Line BP: (94-157)/(33-73) 141/63 mmHg (07/09 0700) FiO2 (%):  [100 %] 100 % (07/09 0330) Weight:  [194 lb 7.1 oz (88.2 kg)-226 lb 6.6 oz (102.7 kg)] 226 lb 6.6 oz (102.7 kg) (07/09 0600)  Hemodynamic parameters for last 24 hours: PAP: (15-33)/(9-25) 21/17 mmHg CO:  [3.4 L/min-4.9 L/min] 3.7 L/min CI:  [1.6 L/min/m2-2.3 L/min/m2] 1.8 L/min/m2  Intake/Output from previous day: 07/08 0701 - 07/09 0700 In: 45409.8 [I.V.:42061.5; JXBJY:7829; NG/GT:60; IV Piggyback:1550] Out: 5250 [Urine:1870; Blood:2500; Chest Tube:880] Intake/Output this shift:    Neurologic: PERRL, followed grip command B earlier for RN Heart: tachy, regular, good valve click Lungs: clear to auscultation bilaterally Extremities: palpable distal pulses Wound: covered, esmark not tense  Lab Results:  Basename 10/08/11 0530 10/07/11 2337 10/07/11 2204  WBC 11.2* -- 8.4  HGB 9.8* 8.5* --  HCT 27.6* 25.0* --  PLT 52* -- 66*   BMET:  Basename 10/08/11 0530 10/07/11 2337 10/07/11 1927  NA 145 146* --  K 3.7 2.4* --  CL 100 97 --  CO2 32 -- 22  GLUCOSE 98 78 --  BUN 31* 26* --  CREATININE 1.99* 1.70* --  CALCIUM 10.2 -- 9.7    PT/INR:  Basename 10/08/11 0530  LABPROT 21.1*  INR 1.79*   ABG    Component Value Date/Time   PHART 7.487* 10/07/2011 2317   HCO3 32.5* 10/07/2011 2317   TCO2 29 10/07/2011 2337   ACIDBASEDEF 9.0* 10/07/2011 1801   O2SAT 91.0 10/07/2011 2317   CBG (last 3)   Basename 10/08/11 0636 10/08/11 0531 10/08/11 0429  GLUCAP  100* 90 97    Assessment/Plan: S/P Procedure(s) (LRB): STERNOTOMY (N/A) THORACIC ASCENDING ANEURYSM REPAIR (AAA) (N/A) POD # 1 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 Will wean gtts as tolerated but at best will be a slow process  RESP- oxygenation remains marginal, out of proportion to findings on CXR, continue ventilator support  RENAL- Acute renal failure, oliguric. UO was poor from start of surgery, briefly improved early postop and now has tapered off again, lytes OK at present  ID- no obvious infection, but concern for possible indolent infection causing pseudoaneurysm, will continue antibiotics until cultures return and chest closed  ENDO- CBG well controlled  ThROMBOCYTOPENIA- give platelets  COAGULOPATHY- will treat with FFP  ANEMIA- improved with transfusion  Will plan to take back to OR this afternoon for washout and possible chest closure although I'm not optimistic that chest can be closed today   LOS: 1 day    Milinda Sweeney C 10/08/2011

## 2011-10-08 NOTE — Anesthesia Postprocedure Evaluation (Signed)
  Anesthesia Post-op Note  Patient: [name]  Procedure(s) Performed: Procedure(s) (LRB): STERNOTOMY (N/A) THORACIC ASCENDING ANEURYSM REPAIR (AAA) (N/A)  Patient Location: SICU  Anesthesia Type: General  Level of Consciousness: sedated and unresponsive  Airway and Oxygen Therapy: patient remains intubated; patient in multiple vasopressors and CV infusions  Post-op Pain: unable to determine  Post-op Assessment: Post-op Vital signs reviewed, Patient's Cardiovascular Status Stable and Respiratory Function Stable  Post-op Vital Signs: Reviewed  Complications: No apparent anesthesia complications

## 2011-10-08 NOTE — Clinical Documentation Improvement (Signed)
Anemia Blood Loss Clarification  THIS DOCUMENT IS NOT A PERMANENT PART OF THE MEDICAL RECORD  RESPOND TO THE THIS QUERY, FOLLOW THE INSTRUCTIONS BELOW:  1. If needed, update documentation for the patient's encounter via the notes activity.  2. Access this query again and click edit on the In [employer].  3. After updating, or not, click F2 to complete all highlighted (required) fields concerning your review. Select "additional documentation in the medical record" OR "no additional documentation provided".  4. Click Sign note button.  5. The deficiency will fall out of your In Basket *Please let us know if you are not able to complete this workflow by phone or e-mail (listed below).        10/08/11  Dear Dr.Dempsy Damiano Marton Redwood  In an effort to better capture your patient's severity of illness, reflect appropriate length of stay and utilization of resources, a review of the patient medical record has revealed the following indicators.    Based on your clinical judgment, please clarify and document in a progress note and/or discharge summary the clinical condition associated with the following supporting information:  In responding to this query please exercise your independent judgment.  The fact that a query is asked, does not imply that any particular answer is desired or expected.  Possible Clinical Conditions?   " Expected Acute Blood Loss Anemia  " Acute Blood Loss Anemia  " Acute on chronic blood loss anemia  " Precipitous drop in Hematocrit  " Other Condition  " Cannot Clinically Determine    Supporting Information:  Signs and Symptoms: Anemia improved with transfusion noted per 7/9 progress notes EBL per 7/8 Anesthesia record: .  Diagnostics: H&H on 7/6:  35.7/11.3 H&H on 7/8:  21.0/7.1  Transfusion: 7/8 PRBC's: per Anesthesia record  IV fluids / plasma expanders: 7/8:  Cell saver: per Anesthesia record 7/8:  Cryoprecipitate:  per Anesthesia record 7/8:  FFP: per Anesthesia record  Reviewed: additional documentation in the medical record  Thank You,  Marciano Sequin,  Clinical Documentation Specialist:  Pager: [phone]  Health Information Management [place]

## 2011-10-08 NOTE — Brief Op Note (Signed)
10/07/2011 - 10/08/2011  6:19 PM  PATIENT:  [name]  61 y.o. male  PRE-OPERATIVE DIAGNOSIS:  Open chest s/p repair ruptured aortic pseudoaneurysm  POST-OPERATIVE DIAGNOSIS:  same  PROCEDURE:  Procedure(s) (LRB): MEDIASTINAL EXPLORATION (N/A), STERNAL CLOSURE  SURGEON:  Surgeon(s) and Role:    * Loreli Slot, MD - Primary  ANESTHESIA:   general  EBL:  Total I/O In: 4707.4 [I.V.:1575.4; Blood:2630; NG/GT:60; IV Piggyback:442] Out: 685 [Urine:160; Blood:300; Chest Tube:225]  DRAINS: 3 Chest Tube(s) in the Mediastinum and left pleura   LOCAL MEDICATIONS USED:  NONE  SPECIMEN:  No Specimen  DISPOSITION OF SPECIMEN:  N/A  COUNTS:  YES  TOURNIQUET:  * No tourniquets in log *  PLAN OF CARE: Admit to inpatient   PATIENT DISPOSITION:  ICU - intubated and hemodynamically stable.   Delay start of Pharmacological VTE agent (>24hrs) due to surgical blood loss or risk of bleeding: yes

## 2011-10-08 NOTE — Anesthesia Postprocedure Evaluation (Signed)
  Anesthesia Post-op Note  Patient: [name]  Procedure(s) Performed: Procedure(s) (LRB): MEDIASTINAL EXPLORATION (N/A)  Patient Location: SICU  Anesthesia Type: General  Level of Consciousness: sedated and Patient remains intubated per anesthesia plan  Airway and Oxygen Therapy: Patient remains intubated per anesthesia plan and Patient placed on Ventilator (see vital sign flow sheet for setting)  Post-op Pain: none  Post-op Assessment: Post-op Vital signs reviewed and Patient's Cardiovascular Status Stable  Post-op Vital Signs: stable  Complications: No apparent anesthesia complications

## 2011-10-08 NOTE — Progress Notes (Signed)
Recruitment done times two minutes at a PC mode 30 PC,16f,+10peep and 100%. Tolerated well and post recruitment sat's raised from 90% to 94%.

## 2011-10-08 NOTE — Transfer of Care (Signed)
Immediate Anesthesia Transfer of Care Note  Patient: [name]  Procedure(s) Performed: Procedure(s) (LRB): MEDIASTINAL EXPLORATION (N/A)  Patient Location: SICU  Anesthesia Type: General  Level of Consciousness: Patient remains intubated per anesthesia plan  Airway & Oxygen Therapy: Patient remains intubated per anesthesia plan and Patient placed on Ventilator (see vital sign flow sheet for setting)  Post-op Assessment: Post -op Vital signs reviewed and stable  Post vital signs: Reviewed and stable  Complications: No apparent anesthesia complications

## 2011-10-09 ENCOUNTER — Inpatient Hospital Stay (HOSPITAL_COMMUNITY): Payer: Medicare Other

## 2011-10-09 ENCOUNTER — Encounter (HOSPITAL_COMMUNITY): Payer: Self-pay | Admitting: Thoracic Surgery (Cardiothoracic Vascular Surgery)

## 2011-10-09 LAB — BASIC METABOLIC PANEL
BUN: 36 mg/dL — ABNORMAL HIGH (ref 6–23)
CO2: 28 mEq/L (ref 19–32)
Chloride: 99 mEq/L (ref 96–112)
Creatinine, Ser: 2.82 mg/dL — ABNORMAL HIGH (ref 0.50–1.35)
GFR calc Af Amer: 27 mL/min — ABNORMAL LOW (ref >90)

## 2011-10-09 LAB — PREPARE FRESH FROZEN PLASMA
Unit division: 0
Unit division: 0
Unit division: 0
Unit division: 0
Unit division: 0
Unit division: 0
Unit division: 0
Unit division: 0
Unit division: 0

## 2011-10-09 LAB — POCT I-STAT 3, ART BLOOD GAS (G3+)
Bicarbonate: 30.5 mEq/L — ABNORMAL HIGH (ref 20.0–24.0)
O2 Saturation: 98 %
Patient temperature: 36.7
Patient temperature: 37.1
TCO2: 32 mmol/L (ref 0–100)
pCO2 arterial: 32.9 mmHg — ABNORMAL LOW (ref 35.0–45.0)
pCO2 arterial: 35.2 mmHg (ref 35.0–45.0)
pH, Arterial: 7.574 — ABNORMAL HIGH (ref 7.350–7.450)
pH, Arterial: 7.55 — ABNORMAL HIGH (ref 7.350–7.450)

## 2011-10-09 LAB — PREPARE RBC (CROSSMATCH)

## 2011-10-09 LAB — POCT I-STAT, CHEM 8
Glucose, Bld: 117 mg/dL — ABNORMAL HIGH (ref 70–99)
HCT: 24 % — ABNORMAL LOW (ref 39.0–52.0)
Hemoglobin: 8.2 g/dL — ABNORMAL LOW (ref 13.0–17.0)
Potassium: 3.3 mEq/L — ABNORMAL LOW (ref 3.5–5.1)
Sodium: 139 mEq/L (ref 135–145)

## 2011-10-09 LAB — PREPARE PLATELET PHERESIS: Unit division: 0

## 2011-10-09 LAB — GLUCOSE, CAPILLARY
Glucose-Capillary: 100 mg/dL — ABNORMAL HIGH (ref 70–99)
Glucose-Capillary: 105 mg/dL — ABNORMAL HIGH (ref 70–99)
Glucose-Capillary: 106 mg/dL — ABNORMAL HIGH (ref 70–99)
Glucose-Capillary: 111 mg/dL — ABNORMAL HIGH (ref 70–99)
Glucose-Capillary: 118 mg/dL — ABNORMAL HIGH (ref 70–99)
Glucose-Capillary: 118 mg/dL — ABNORMAL HIGH (ref 70–99)
Glucose-Capillary: 120 mg/dL — ABNORMAL HIGH (ref 70–99)
Glucose-Capillary: 120 mg/dL — ABNORMAL HIGH (ref 70–99)
Glucose-Capillary: 124 mg/dL — ABNORMAL HIGH (ref 70–99)
Glucose-Capillary: 124 mg/dL — ABNORMAL HIGH (ref 70–99)
Glucose-Capillary: 125 mg/dL — ABNORMAL HIGH (ref 70–99)
Glucose-Capillary: 125 mg/dL — ABNORMAL HIGH (ref 70–99)
Glucose-Capillary: 125 mg/dL — ABNORMAL HIGH (ref 70–99)
Glucose-Capillary: 128 mg/dL — ABNORMAL HIGH (ref 70–99)
Glucose-Capillary: 134 mg/dL — ABNORMAL HIGH (ref 70–99)

## 2011-10-09 LAB — CBC
HCT: 27.3 % — ABNORMAL LOW (ref 39.0–52.0)
HCT: 24.3 % — ABNORMAL LOW (ref 39.0–52.0)
MCH: 30.2 pg (ref 26.0–34.0)
MCV: 82.2 fL (ref 78.0–100.0)
MCV: 83.5 fL (ref 78.0–100.0)
Platelets: 72 10*3/uL — ABNORMAL LOW (ref 150–400)
RDW: 15.4 % (ref 11.5–15.5)
RDW: 15.8 % — ABNORMAL HIGH (ref 11.5–15.5)
WBC: 10.5 10*3/uL (ref 4.0–10.5)

## 2011-10-09 LAB — CREATININE, SERUM: GFR calc Af Amer: 22 mL/min — ABNORMAL LOW (ref >90)

## 2011-10-09 LAB — PROTIME-INR: Prothrombin Time: 21 seconds — ABNORMAL HIGH (ref 11.6–15.2)

## 2011-10-09 MED ORDER — SODIUM CHLORIDE 0.9 % IV SOLN
30.0000 ug | Freq: Once | INTRAVENOUS | Status: AC
  Administered 2011-10-09: 30 ug via INTRAVENOUS
  Filled 2011-10-09: qty 7.5

## 2011-10-09 MED FILL — Lidocaine HCl IV Inj 20 MG/ML: INTRAVENOUS | Qty: 5 | Status: AC

## 2011-10-09 MED FILL — Vancomycin HCl For IV Soln 1 GM (Base Equivalent): INTRAVENOUS | Qty: 1000 | Status: AC

## 2011-10-09 MED FILL — Heparin Sodium (Porcine) Inj 1000 Unit/ML: INTRAMUSCULAR | Qty: 10 | Status: AC

## 2011-10-09 MED FILL — Sodium Bicarbonate IV Soln 8.4%: INTRAVENOUS | Qty: 3 | Status: AC

## 2011-10-09 MED FILL — Dexmedetomidine HCl IV Soln 200 MCG/2ML: INTRAVENOUS | Qty: 4 | Status: AC

## 2011-10-09 MED FILL — Sodium Bicarbonate IV Soln 8.4%: INTRAVENOUS | Qty: 5 | Status: AC

## 2011-10-09 MED FILL — Mannitol IV Soln 20%: INTRAVENOUS | Qty: 2500 | Status: AC

## 2011-10-09 MED FILL — Dopamine HCl Inj 40 MG/ML: INTRAVENOUS | Qty: 10 | Status: AC

## 2011-10-09 MED FILL — Nitroglycerin IV Soln 5 MG/ML: INTRAVENOUS | Qty: 10 | Status: AC

## 2011-10-09 MED FILL — Heparin Sodium (Porcine) Inj 1000 Unit/ML: INTRAMUSCULAR | Qty: 2500 | Status: AC

## 2011-10-09 MED FILL — Tranexamic Acid Inj 100 MG/ML: INTRAVENOUS | Qty: 2500 | Status: AC

## 2011-10-09 MED FILL — Sodium Chloride IV Soln 0.9%: INTRAVENOUS | Qty: 3000 | Status: AC

## 2011-10-09 MED FILL — Phenylephrine HCl Inj 10 MG/ML: INTRAMUSCULAR | Qty: 2 | Status: AC

## 2011-10-09 MED FILL — Heparin Sodium (Porcine) Inj 1000 Unit/ML: INTRAMUSCULAR | Qty: 30 | Status: AC

## 2011-10-09 MED FILL — Electrolyte-R (PH 7.4) Solution: INTRAVENOUS | Qty: 4000 | Status: AC

## 2011-10-09 NOTE — Progress Notes (Signed)
Spoke with Dr. Donata Clay about unit of PRBC he wanted pt to receive.  He told me to hold off on that unit for now.  I will cont to monitor.

## 2011-10-09 NOTE — Progress Notes (Signed)
1 Day Post-Op Procedure(s) (LRB): MEDIASTINAL EXPLORATION (N/A) Subjective:coagulopathy  And  mutisystem failure after emerg ASC aneutysm repair  Objective: Vital signs in last 24 hours: Temp:  [95.9 F (35.5 C)-99.3 F (37.4 C)] 99.3 F (37.4 C) (07/10 1900) Pulse Rate:  [99-119] 115  (07/10 1940) Cardiac Rhythm:  [-] Sinus tachycardia (07/10 1900) Resp:  [0-19] 15  (07/10 1940) BP: (88-117)/(45-65) 112/54 mmHg (07/10 1940) SpO2:  [95 %-98 %] 98 % (07/10 1940) Arterial Line BP: (84-122)/(43-70) 108/53 mmHg (07/10 1900) FiO2 (%):  [80 %-100 %] 80 % (07/10 1940) Weight:  [242 lb 11.6 oz (110.1 kg)] 242 lb 11.6 oz (110.1 kg) (07/10 0600)  Hemodynamic parameters for last 24 hours: PAP: (19-35)/(15-29) 33/19 mmHg CO:  [4.5 L/min-5.9 L/min] 5.9 L/min CI:  [2.1 L/min/m2-2.8 L/min/m2] 2.8 L/min/m2  Intake/Output from previous day: 07/09 0701 - 07/10 0700 In: 7856.4 [I.V.:3503.4; MWUXL:2440; NG/GT:150; IV Piggyback:742] Out: 1795 [Urine:350; Emesis/NG output:500; Blood:300; Chest Tube:645] Intake/Output this shift:    Sedated on Vent,multiple pressors Blood draining from sternal incision Min u/o  Lab Results:  Basename 10/09/11 1641 10/09/11 1630 10/09/11 0300  WBC -- 13.2* 10.5  HGB 8.2* 8.8* --  HCT 24.0* 24.3* --  PLT -- 72* 55*   BMET:  Basename 10/09/11 1641 10/09/11 1630 10/09/11 0300 10/08/11 0530  NA 139 -- 143 --  K 3.3* -- 3.2* --  CL 95* -- 99 --  CO2 -- -- 28 32  GLUCOSE 117* -- 122* --  BUN 36* -- 36* --  CREATININE 3.70* 3.33* -- --  CALCIUM -- -- 8.2* 10.2    PT/INR:  Basename 10/09/11 0300  LABPROT 21.0*  INR 1.78*   ABG    Component Value Date/Time   PHART 7.550* 10/09/2011 1416   HCO3 30.7* 10/09/2011 1416   TCO2 27 10/09/2011 1641   ACIDBASEDEF 9.0* 10/07/2011 1801   O2SAT 98.0 10/09/2011 1416   CBG (last 3)   Basename 10/09/11 1850 10/09/11 1757 10/09/11 1600  GLUCAP 98 106* 106*    Assessment/Plan: S/P Procedure(s) (LRB): MEDIASTINAL  EXPLORATION (N/A) Blood product replacement O2 sats improving cont PeeP   LOS: 2 days    [name],[name] 10/09/2011

## 2011-10-09 NOTE — Op Note (Signed)
NAMEJAYME, CHAM NO.:  [id number]  MEDICAL RECORD NO.:  [id number]  LOCATION:  2316                         FACILITY:  [facility]  PHYSICIAN:  Salvatore Decent. Dorris Fetch, M.D.DATE OF BIRTH:  12/26/1950  DATE OF PROCEDURE:  10/08/2011 DATE OF DISCHARGE:                              OPERATIVE REPORT   PREOPERATIVE DIAGNOSIS:  Open chest status post repair of ruptured aortic pseudoaneurysm.  POSTOPERATIVE DIAGNOSIS:  Open chest status post repair of ruptured aortic pseudoaneurysm.  PROCEDURE:  Reexploration of mediastinum and sternal closure.  SURGEON:  Salvatore Decent. Dorris Fetch, MD  ASSISTANT:  Ronn Melena, RNFA.  ANESTHESIA:  General.  FINDINGS:  Approximately 500 mL of blood and clot within the mediastinum.  The patient tolerated sternal closure without significant hemodynamic change.  CLINICAL NOTE:  Mr. Bannister is a 61 year old gentleman who had presented the day prior to surgery with a contained ruptured aortic pseudoaneurysm.  He had undergone emergent repair of this and postoperative course had been marked by significant coagulopathy and hemodynamic instability requiring multiple pressors.  The patient had severe right ventricular dysfunction and the sternum was unable to be closed.  The wound was covered with Esmarch.  The patient had stabilized overnight and was felt stable enough to return to the OR to wash out the clot, reinspect the mediastinum, and potentially close the chest.  The patient's family gave consent for the procedure.  OPERATIVE NOTE:  Mr. Orona was brought directly from the ICU to the operating room on October 08, 2011.  He remained hemodynamically stable throughout transport.  After induction of general anesthesia, the dressings were removed and the chest was prepped and draped in usual sterile fashion.  The sutures holding the Esmarch covering in place were removed and the Esmarch was taken off.  There was clot within the anterior  mediastinum, this was evacuated, there was approximately 500 mL of clot total when accounting for the mediastinum as well as the pericardial and pleural spaces.  There was no hemodynamic change with removal of the clot.  The tubes were each individually suctioned out to remove any residual blood within them.  The anterior tube was replaced with a larger bore tube. The proximal and distal anastomoses were inspected and there was no bleeding, in fact there was no active bleeding from anywhere within the mediastinum, there was bleeding from the sternal edges as well as the skin and subcutaneous tissue.  The chest was copiously irrigated with approximately a liter and a half of warm saline.  The decision was made to attempt sternal closure.  Heavy gauge double stainless steel wires were placed and the sternum was brought together.  The patient tolerated this well hemodynamically.  The wires were tightened.  After closure of the sternum, the patient remained relatively hemodynamically stable.  His index did drop to below 2 transiently, but responded to atrial pacing at 100 beats per minute and volume administration.  The cardiac index then was greater than 2 L/meter squared without changes in the ionotropic agents. The pectoralis fascia was closed with a running #1 Vicryl suture.  The subcutaneous tissue was closed with a 2-0 Vicryl suture and the skin  was closed with staples.  The patient was observed in the operating room.  He remained hemodynamically stable.  He then was transported from the operating room to the surgical intensive care unit in critical, but stable condition.     Salvatore Decent Dorris Fetch, M.D.     SCH/MEDQ  D:  10/08/2011  T:  10/09/2011  Job:  [id number]

## 2011-10-09 NOTE — Progress Notes (Signed)
Clinical Social Work-CSW received referral for psychosocial support-CSW attempted to assess pt-pt intubated-no family at bedside-CSW will follow up and attempt to contact family as appropriate- Jodean Lima, [phone]

## 2011-10-10 ENCOUNTER — Inpatient Hospital Stay (HOSPITAL_COMMUNITY): Payer: Medicare Other

## 2011-10-10 LAB — BASIC METABOLIC PANEL
CO2: 28 mEq/L (ref 19–32)
Calcium: 7.9 mg/dL — ABNORMAL LOW (ref 8.4–10.5)
Glucose, Bld: 151 mg/dL — ABNORMAL HIGH (ref 70–99)
Sodium: 139 mEq/L (ref 135–145)

## 2011-10-10 LAB — CBC
HCT: 27.5 % — ABNORMAL LOW (ref 39.0–52.0)
Hemoglobin: 9.8 g/dL — ABNORMAL LOW (ref 13.0–17.0)
MCH: 30 pg (ref 26.0–34.0)
MCHC: 35.6 g/dL (ref 30.0–36.0)
MCV: 84.1 fL (ref 78.0–100.0)
Platelets: 47 10*3/uL — ABNORMAL LOW (ref 150–400)
RBC: 3.27 MIL/uL — ABNORMAL LOW (ref 4.22–5.81)
RDW: 15.8 % — ABNORMAL HIGH (ref 11.5–15.5)
WBC: 15.2 10*3/uL — ABNORMAL HIGH (ref 4.0–10.5)

## 2011-10-10 LAB — GLUCOSE, CAPILLARY
Glucose-Capillary: 87 mg/dL (ref 70–99)
Glucose-Capillary: 85 mg/dL (ref 70–99)
Glucose-Capillary: 131 mg/dL — ABNORMAL HIGH (ref 70–99)
Glucose-Capillary: 134 mg/dL — ABNORMAL HIGH (ref 70–99)
Glucose-Capillary: 122 mg/dL — ABNORMAL HIGH (ref 70–99)

## 2011-10-10 LAB — COMPREHENSIVE METABOLIC PANEL
ALT: 1207 U/L — ABNORMAL HIGH (ref 0–53)
AST: 918 U/L — ABNORMAL HIGH (ref 0–37)
Albumin: 2.5 g/dL — ABNORMAL LOW (ref 3.5–5.2)
Alkaline Phosphatase: 93 U/L (ref 39–117)
BUN: 43 mg/dL — ABNORMAL HIGH (ref 6–23)
CO2: 28 mEq/L (ref 19–32)
Calcium: 7.9 mg/dL — ABNORMAL LOW (ref 8.4–10.5)
Chloride: 95 mEq/L — ABNORMAL LOW (ref 96–112)
Creatinine, Ser: 3.27 mg/dL — ABNORMAL HIGH (ref 0.50–1.35)
GFR calc Af Amer: 22 mL/min — ABNORMAL LOW (ref >90)
GFR calc non Af Amer: 19 mL/min — ABNORMAL LOW (ref >90)
Glucose, Bld: 89 mg/dL (ref 70–99)
Potassium: 3.2 mEq/L — ABNORMAL LOW (ref 3.5–5.1)
Sodium: 137 mEq/L (ref 135–145)
Total Bilirubin: 1.5 mg/dL — ABNORMAL HIGH (ref 0.3–1.2)
Total Protein: 5.5 g/dL — ABNORMAL LOW (ref 6.0–8.3)

## 2011-10-10 LAB — TISSUE CULTURE
Culture: NO GROWTH
Gram Stain: NONE SEEN

## 2011-10-10 LAB — BODY FLUID CULTURE

## 2011-10-10 LAB — PREPARE PLATELET PHERESIS
Unit division: 0
Unit division: 0

## 2011-10-10 LAB — PROTIME-INR
INR: 2.09 — ABNORMAL HIGH (ref 0.00–1.49)
Prothrombin Time: 23.8 seconds — ABNORMAL HIGH (ref 11.6–15.2)

## 2011-10-10 LAB — APTT: aPTT: 42 seconds — ABNORMAL HIGH (ref 24–37)

## 2011-10-10 MED ORDER — INSULIN ASPART 100 UNIT/ML ~~LOC~~ SOLN: 0.0000 [IU] | SUBCUTANEOUS | Status: AC

## 2011-10-10 MED ORDER — POTASSIUM CHLORIDE 10 MEQ/50ML IV SOLN
10.0000 meq | INTRAVENOUS | Status: AC
  Administered 2011-10-10 (×2): 10 meq via INTRAVENOUS
  Filled 2011-10-10: qty 50

## 2011-10-10 MED ORDER — POTASSIUM CHLORIDE 10 MEQ/50ML IV SOLN
10.0000 meq | INTRAVENOUS | Status: AC
  Administered 2011-10-10 (×2): 10 meq via INTRAVENOUS
  Filled 2011-10-10 (×2): qty 50

## 2011-10-10 MED ORDER — INSULIN ASPART 100 UNIT/ML ~~LOC~~ SOLN
0.0000 [IU] | SUBCUTANEOUS | Status: DC
  Administered 2011-10-10 – 2011-10-18 (×13): 2 [IU] via SUBCUTANEOUS

## 2011-10-10 NOTE — Progress Notes (Signed)
2 Days Post-Op Procedure(s) (LRB): MEDIASTINAL EXPLORATION (N/A) Subjective: Intubated, sedated  Objective: Vital signs in last 24 hours: Temp:  [98.1 F (36.7 C)-99.3 F (37.4 C)] 99.3 F (37.4 C) (07/11 0734) Pulse Rate:  [104-119] 105  (07/11 0800) Cardiac Rhythm:  [-] Sinus tachycardia (07/11 0600) Resp:  [0-19] 19  (07/11 0800) BP: (88-115)/(45-60) 115/60 mmHg (07/11 0800) SpO2:  [96 %-98 %] 97 % (07/11 0800) Arterial Line BP: (84-117)/(43-59) 109/56 mmHg (07/11 0700) FiO2 (%):  [80 %] 80 % (07/11 0800) Weight:  [239 lb 3.2 oz (108.5 kg)] 239 lb 3.2 oz (108.5 kg) (07/11 0400)  Hemodynamic parameters for last 24 hours: PAP: (19-33)/(15-21) 29/20 mmHg CO:  [5 L/min-5.9 L/min] 5 L/min CI:  [2.4 L/min/m2-2.8 L/min/m2] 2.4 L/min/m2  Intake/Output from previous day: 07/10 0701 - 07/11 0700 In: 4475.2 [I.V.:3375.2; Blood:660; NG/GT:240; IV Piggyback:200] Out: 0102 [Urine:2995; Emesis/NG output:700; Chest Tube:270] Intake/Output this shift:    Heart: regular rate and rhythm Lungs: clear to auscultation bilaterally Abdomen: mildly distended, hypoactive BS Wound: some serosanguinous drainage  Lab Results:  Basename 10/10/11 0400 10/09/11 1641 10/09/11 1630  WBC 15.2* -- 13.2*  HGB 9.8* 8.2* --  HCT 27.5* 24.0* --  PLT 47* -- 72*   BMET:  Basename 10/10/11 0400 10/09/11 1641 10/09/11 0300  NA 137 139 --  K 3.2* 3.3* --  CL 95* 95* --  CO2 28 -- 28  GLUCOSE 89 117* --  BUN 43* 36* --  CREATININE 3.27* 3.70* --  CALCIUM 7.9* -- 8.2*    PT/INR:  Basename 10/10/11 0400  LABPROT 23.8*  INR 2.09*   ABG    Component Value Date/Time   PHART 7.550* 10/09/2011 1416   HCO3 30.7* 10/09/2011 1416   TCO2 27 10/09/2011 1641   ACIDBASEDEF 9.0* 10/07/2011 1801   O2SAT 98.0 10/09/2011 1416   CBG (last 3)   Basename 10/10/11 0734 10/10/11 0255 10/10/11 0156  GLUCAP 103* 87 102*    Assessment/Plan: S/P Procedure(s) (LRB): MEDIASTINAL EXPLORATION (N/A) POD # 3 repair  ruptured aortic pseudoaneurysm CV- hemodynamics stable, good index on Milrinone and Dopamine  Will keep swan today to follow cardiac output, filling pressures with diuresis  RESP- oxygenation improved but still requiring relatively high levels of PEEP and O2(8 and 80%)  Wean FiO2 to 60 then try to wean PEEP  RENAL- now in nonoliguric recovery phase of ATN- creatinine down from 3.7 to 3.2, continue diuresis with lasix gtt  HYPOKALEMIA- supplement  ENDO- CBG well controlled  NUTRITION- will see how much progress we make over next 24 hours, but may need to consider panda tube if unable to progress with vent wean  ID- afebrile, cultures from OR negative, d/c zinacef after CT  Out  D/C Chest tubes  ANEMIA- Hct stable  THROMBOCYTOPENIA- stable, avoid heparin  COAGULOPATHY- stable, INR 2.09, no coumadin today, will restart when INR < 2.0, goal INR will be 2.5-3.5    LOS: 3 days    [name] C 10/10/2011

## 2011-10-10 NOTE — Progress Notes (Signed)
Stable day BP 91/55  Pulse 93  Temp 98.6 F (37 C) (Core (Comment))  Resp 15  Ht 6' (1.829 m)  Wt 239 lb 3.2 oz (108.5 kg)  BMI 32.44 kg/m2  SpO2 95% Currently on 60% and 5 of PEEP- sat 95% Epi at 2.5 mcg/min Diuresing well K 3.5 Creatinine 3.2

## 2011-10-10 NOTE — Progress Notes (Signed)
Increased FIO2 to 70% due to desat issue.

## 2011-10-11 ENCOUNTER — Inpatient Hospital Stay (HOSPITAL_COMMUNITY): Payer: Medicare Other

## 2011-10-11 LAB — POCT I-STAT 3, ART BLOOD GAS (G3+)
Acid-Base Excess: 7 mmol/L — ABNORMAL HIGH (ref 0.0–2.0)
Bicarbonate: 30 mEq/L — ABNORMAL HIGH (ref 20.0–24.0)
O2 Saturation: 99 %
Patient temperature: 36.3
TCO2: 31 mmol/L (ref 0–100)
pH, Arterial: 7.547 — ABNORMAL HIGH (ref 7.350–7.450)

## 2011-10-11 LAB — COMPREHENSIVE METABOLIC PANEL
BUN: 50 mg/dL — ABNORMAL HIGH (ref 6–23)
Calcium: 8.3 mg/dL — ABNORMAL LOW (ref 8.4–10.5)
Creatinine, Ser: 2.78 mg/dL — ABNORMAL HIGH (ref 0.50–1.35)
GFR calc Af Amer: 27 mL/min — ABNORMAL LOW (ref >90)
Glucose, Bld: 136 mg/dL — ABNORMAL HIGH (ref 70–99)
Sodium: 139 mEq/L (ref 135–145)
Total Protein: 6 g/dL (ref 6.0–8.3)

## 2011-10-11 LAB — TYPE AND SCREEN
ABO/RH(D): A NEG
Unit division: 0
Unit division: 0
Unit division: 0
Unit division: 0
Unit division: 0
Unit division: 0
Unit division: 0
Unit division: 0

## 2011-10-11 LAB — GLUCOSE, CAPILLARY
Glucose-Capillary: 128 mg/dL — ABNORMAL HIGH (ref 70–99)
Glucose-Capillary: 113 mg/dL — ABNORMAL HIGH (ref 70–99)
Glucose-Capillary: 99 mg/dL (ref 70–99)
Glucose-Capillary: 153 mg/dL — ABNORMAL HIGH (ref 70–99)
Glucose-Capillary: 62 mg/dL — ABNORMAL LOW (ref 70–99)

## 2011-10-11 LAB — POCT I-STAT, CHEM 8
Calcium, Ion: 1.13 mmol/L (ref 1.12–1.23)
Creatinine, Ser: 2.7 mg/dL — ABNORMAL HIGH (ref 0.50–1.35)
Glucose, Bld: 110 mg/dL — ABNORMAL HIGH (ref 70–99)
Hemoglobin: 8.8 g/dL — ABNORMAL LOW (ref 13.0–17.0)
Potassium: 3.1 mEq/L — ABNORMAL LOW (ref 3.5–5.1)
TCO2: 25 mmol/L (ref 0–100)

## 2011-10-11 LAB — CBC
HCT: 28.8 % — ABNORMAL LOW (ref 39.0–52.0)
Hemoglobin: 9.9 g/dL — ABNORMAL LOW (ref 13.0–17.0)
RBC: 3.37 MIL/uL — ABNORMAL LOW (ref 4.22–5.81)
WBC: 17.2 10*3/uL — ABNORMAL HIGH (ref 4.0–10.5)

## 2011-10-11 MED ORDER — EPINEPHRINE HCL 1 MG/ML IJ SOLN
0.5000 ug/min | INTRAVENOUS | Status: DC
  Administered 2011-10-11: 2 ug/min via INTRAVENOUS
  Filled 2011-10-11: qty 1

## 2011-10-11 MED ORDER — ALBUMIN HUMAN 5 % IV SOLN
INTRAVENOUS | Status: AC
  Filled 2011-10-11: qty 250

## 2011-10-11 MED ORDER — DEXTROSE 50 % IV SOLN: 25.0000 mL | Freq: Once | INTRAVENOUS | Status: AC | PRN

## 2011-10-11 MED ORDER — POTASSIUM CHLORIDE 10 MEQ/50ML IV SOLN
INTRAVENOUS | Status: AC
  Filled 2011-10-11: qty 50

## 2011-10-11 MED ORDER — ALBUMIN HUMAN 5 % IV SOLN
12.5000 g | Freq: Once | INTRAVENOUS | Status: AC
  Administered 2011-10-11: 12.5 g via INTRAVENOUS

## 2011-10-11 MED ORDER — DEXTROSE 50 % IV SOLN
INTRAVENOUS | Status: AC
  Administered 2011-10-11: 25 mL
  Filled 2011-10-11: qty 50

## 2011-10-11 MED ORDER — CALCIUM CHLORIDE 10 % IV SOLN
1.0000 g | Freq: Once | INTRAVENOUS | Status: AC
  Administered 2011-10-11: 1 g via INTRAVENOUS

## 2011-10-11 MED ORDER — POTASSIUM CHLORIDE 10 MEQ/50ML IV SOLN
10.0000 meq | INTRAVENOUS | Status: AC
  Administered 2011-10-11 – 2011-10-12 (×5): 10 meq via INTRAVENOUS
  Filled 2011-10-11: qty 200

## 2011-10-11 MED ORDER — POTASSIUM CHLORIDE 10 MEQ/50ML IV SOLN
10.0000 meq | INTRAVENOUS | Status: AC
  Administered 2011-10-11 (×3): 10 meq via INTRAVENOUS
  Filled 2011-10-11: qty 50
  Filled 2011-10-11: qty 100

## 2011-10-11 MED ORDER — DIPHENHYDRAMINE HCL 50 MG/ML IJ SOLN
25.0000 mg | Freq: Once | INTRAMUSCULAR | Status: AC
  Administered 2011-10-11: 25 mg via INTRAVENOUS
  Filled 2011-10-11: qty 1

## 2011-10-11 NOTE — Progress Notes (Signed)
CBG: 62 at 1940  Treatment: D50 IV 25 mL  Symptoms: None  Follow-up CBG: Time:2000 CBG Result:153  Possible Reasons for Event: Unknown  Comments/MD notified:Dr. Annabelle Harman, Rochele Raring

## 2011-10-11 NOTE — Progress Notes (Signed)
Nutrition Follow-up  Intervention:    Once Panda tube placed & verified, recommend initiation of Osmolite 1.5 formula at 20 ml/hr and increase by 10 ml every 4 hours to goal rate of 50 ml/hr with Prostat liquid protein 3 times daily via tube to provide 2100 total kcals, 120 gm protein, 914 ml of free water RD to follow for nutrition care plan  Assessment:   Patient remains intubated & sedated. S/p re-exploration of mediastinum and sternal closure 7/10. OGT to suction. Per RN, plan is to initiate EN; Panda tube to be placed today (tip post-pyloric). Diuresing well.   Diet Order:  NPO  Meds: Scheduled Meds:   . acetaminophen  1,000 mg Oral Q6H   Or  . acetaminophen (TYLENOL) oral liquid 160 mg/5 mL  975 mg Per Tube Q6H  . antiseptic oral rinse  15 mL Mouth Rinse QID  . aspirin EC  325 mg Oral Daily   Or  . aspirin  324 mg Per Tube Daily  . bisacodyl  10 mg Oral Daily   Or  . bisacodyl  10 mg Rectal Daily  . cefUROXime (ZINACEF)  IV  1.5 g Intravenous Q12H  . chlorhexidine  15 mL Mouth Rinse BID  . diphenhydrAMINE  25 mg Intravenous Once  . docusate sodium  200 mg Oral Daily  . insulin aspart  0-24 Units Subcutaneous Q2H   Followed by  . insulin aspart  0-24 Units Subcutaneous Q4H  . metoprolol tartrate  12.5 mg Oral BID   Or  . metoprolol tartrate  12.5 mg Per Tube BID  . pantoprazole  40 mg Oral Q1200  . potassium chloride  10 mEq Intravenous Q1 Hr x 2  . potassium chloride  10 mEq Intravenous Q1 Hr x 2  . potassium chloride  10 mEq Intravenous Q1 Hr x 3  . sodium chloride  3 mL Intravenous Q12H  . venlafaxine XR  150 mg Oral BID   Continuous Infusions:   . sodium chloride 20 mL/hr at 10/11/11 0600  . sodium chloride Stopped (10/08/11 1830)  . sodium chloride 10 mL/hr at 10/07/11 1845  . sodium chloride 250 mL (10/08/11 0502)  . sodium chloride Stopped (10/08/11 1830)  . sodium chloride 250 mL (10/11/11 0600)  . amiodarone (NEXTERONE PREMIX) 360 mg/200 mL dextrose 30  mg/hr (10/11/11 0400)  . dexmedetomidine 0.7 mcg/kg/hr (10/11/11 0400)  . DOPamine 8 mcg/kg/min (10/11/11 0400)  . furosemide (LASIX) infusion 10 mg/hr (10/11/11 0400)  . insulin (NOVOLIN-R) infusion Stopped (10/10/11 0305)  . lactated ringers 20 mL/hr at 10/08/11 1830  . milrinone 0.2 mcg/kg/min (10/11/11 0800)  . nitroGLYCERIN Stopped (10/08/11 1830)  . norepinephrine (LEVOPHED) Adult infusion Stopped (10/07/11 2145)  . phenylephrine (NEO-SYNEPHRINE) Adult infusion Stopped (10/09/11 0981)  . DISCONTD: EPINEPHrine infusion Stopped (10/11/11 0700)   PRN Meds:.ALPRAZolam, metoprolol, metoprolol, midazolam, morphine injection, ondansetron (ZOFRAN) IV, oxyCODONE, sodium chloride  Labs:  CMP     Component Value Date/Time   NA 139 10/11/2011 0349   K 3.1* 10/11/2011 0349   CL 98 10/11/2011 0349   CO2 27 10/11/2011 0349   GLUCOSE 136* 10/11/2011 0349   BUN 50* 10/11/2011 0349   CREATININE 2.78* 10/11/2011 0349   CALCIUM 8.3* 10/11/2011 0349   PROT 6.0 10/11/2011 0349   ALBUMIN 2.6* 10/11/2011 0349   AST 304* 10/11/2011 0349   ALT 863* 10/11/2011 0349   ALKPHOS 110 10/11/2011 0349   BILITOT 1.3* 10/11/2011 0349   GFRNONAA 23* 10/11/2011 0349   GFRAA 27* 10/11/2011  1610     Intake/Output Summary (Last 24 hours) at 10/11/11 0943 Last data filed at 10/11/11 0900  Gross per 24 hour  Intake 3029.2 ml  Output   5780 ml  Net -2750.8 ml    CBG (last 3)   Basename 10/11/11 0721 10/11/11 0357 10/10/11 2329  GLUCAP 113* 128* 125*    Weight Status:  102.7 kg (7/12) -- fluctuating given fluid  Re-estimated needs:  2000-2100 kcals, 120-130 gm protein  Nutrition Dx:  Inadequate Oral Intake r/t inability to eat as evidenced by NPO status, ongoing  New Goal:  EN to meet >/= 90% of estimated nutrition needs, progressing  Monitor:  EN initiation & regimen, respiratory status, weight, labs, I/O's   Alger Memos, RD, LDN Pager #: [phone] After-Hours Pager #: [phone]

## 2011-10-11 NOTE — H&P (Signed)
[name] is an 61 y.o. male.   Chief Complaint: [name] out HPI: 61 yo Wm s/p AVR in May. Presented to [facility] ED with cc/o syncope. He had been feeling poorly for about 4 days PTA. He says he felt very tired, weak and ill for about 4 days. The morning of admission he passed out. 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 He was hypotensive and in shock. He was transported emergently to the OR at [facility] by EMS.  History and PE very limited due to urgency of clinical situation.  Past Medical History  Diagnosis Date  . Murmur     Trace mitral regurgitation by echocardiogram February 2012  . Hyperlipidemia     takes Simvastatin nightly  . Mitral regurgitation     New murmur at the apex consistent with mitral regurgitation  . Aortic stenosis     Bicuspid aortic valve with mild-to-moderate aortic stenosis echocardiogram February 2012 mean gradient 34 mmHg, aortic valve area 0.7 cm, peak velocity 2.43 m/scomment by catheterization mean gradient 18 mmHg December 2010 status post TEE  . CAD (coronary artery disease)     native vessel, nonobstructive, by catheterization, last catheterization December 2010  . Dyslipidemia   . Fibromyalgia   . Peripheral vascular disease   . Myocardial infarction 10 yrs ago  . Shortness of breath     sitting/lying/exertion  . Bronchitis     hx of-53yrs ago  . Ringing in ears     since seizure 75yrs ago-he hit his head  . Seizures     6 yrs ago from withdrawal of xanax to quickly.  . Dizziness   . Confusion   . Short-term memory loss   . Long-term memory loss   . Stroke     behind right eye-occ sees double 12 yrs ago  . Arthritis   . Joint pain   . Pain and swelling of forearm   . Chronic back pain     lumbar spondylosis  . Dry skin   . GERD (gastroesophageal reflux disease)     takes Protonix daily  . Constipation     takes Miralax prn  . Hx of colonic polyps   .  Slow urinary stream   . Nocturia   . Burning with urination   . Major depressive disorder     wih prior suicidal attempts;takes Effexor bid  . Anxiety     takes Xanax 5 times day    Past Surgical History  Procedure Date  . Inguinal exploration     right  . Femoral hernia repair     x2  . Umbilical hernia repair     [facility]  . Finger contracture release     Tendon release operation on his right little finger  . Appendectomy   . Cholecystectomy     [facility]  . Incisional hernia repair 05/01/2011    Procedure: HERNIA REPAIR INCISIONAL;  Surgeon: Dalia Heading, MD;  Location: [facility];  Service: General;  Laterality: N/A;  with Mesh  . Tonsillectomy     at age 68  . Circumcision   . Cardiac catheterization   . Colonoscopy   . Esophagogastroduodenoscopy 04/07/08/13  . Aortic valve replacement 08/15/2011    Procedure: AORTIC VALVE REPLACEMENT (AVR);  Surgeon: Loreli Slot, MD;  Location: [facility] OR;  Service: Open Heart Surgery;  Laterality: N/A;  . Sternotomy 10/07/2011  Procedure: STERNOTOMY;  Surgeon: Loreli Slot, MD;  Location: [facility] OR;  Service: Open Heart Surgery;  Laterality: N/A;  . Thoracic aortic aneurysm repair 10/07/2011    Procedure: THORACIC ASCENDING ANEURYSM REPAIR (AAA);  Surgeon: Loreli Slot, MD;  Location: [facility] OR;  Service: Open Heart Surgery;  Laterality: N/A;  . Mediastinal exploration 10/08/2011    Procedure: MEDIASTINAL EXPLORATION;  Surgeon: Loreli Slot, MD;  Location: [facility] OR;  Service: Open Heart Surgery;  Laterality: N/A;    Family History  Problem Relation Age of Onset  . Cancer    . Stroke    . Anesthesia problems Neg Hx   . Hypotension Neg Hx   . Malignant hyperthermia Neg Hx   . Pseudochol deficiency Neg Hx    Social History:  reports that he quit smoking about 6 weeks ago. His smoking use included Cigarettes. He has a 24 pack-year smoking history. He has never used smokeless tobacco. He reports that he does not drink alcohol or use  illicit drugs.  Allergies: No Known Allergies  Medications Prior to Admission  Medication Sig Dispense Refill  . ALPRAZolam (XANAX) 1 MG tablet Take 1 mg by mouth 3 (three) times daily as needed.       [name] amoxicillin-clavulanate (AUGMENTIN) 875-125 MG per tablet Take 1 tablet by mouth 2 (two) times daily.      [name] aspirin EC 81 MG tablet Take 81 mg by mouth daily.      [name] HYDROcodone-acetaminophen (NORCO) 5-325 MG per tablet Take 1 tablet by mouth Three times a day.      . metoprolol tartrate (LOPRESSOR) 12.5 mg TABS Take 12.5 mg by mouth 2 (two) times daily.      . pantoprazole (PROTONIX) 40 MG tablet Take 40 mg by mouth daily.        . promethazine (PHENERGAN) 25 MG tablet Take 25 mg by mouth every 6 (six) hours as needed. For nausea      . simvastatin (ZOCOR) 40 MG tablet Take 40 mg by mouth at bedtime.        [name] venlafaxine (EFFEXOR-XR) 150 MG 24 hr capsule Take 150 mg by mouth 2 (two) times daily.       [name] warfarin (COUMADIN) 2 MG tablet Take 2-3 mg by mouth daily. Take 2 MG every day except for Mondays; on Mondays, take 3 MG        Results for orders placed during the hospital encounter of 10/07/11 (from the past 48 hour(s))  PREPARE PLATELET PHERESIS     Status: Normal   Collection Time   10/09/11  3:00 PM      Component Value Range Comment   Unit Number [id number]      Blood Component Type PLTPHER LR3      Unit division 00      Status of Unit ISSUED,FINAL      Transfusion Status OK TO TRANSFUSE     GLUCOSE, CAPILLARY     Status: Abnormal   Collection Time   10/09/11  3:04 PM      Component Value Range Comment   Glucose-Capillary 124 (*) 70 - 99 mg/dL   GLUCOSE, CAPILLARY     Status: Abnormal   Collection Time   10/09/11  4:00 PM      Component Value Range Comment   Glucose-Capillary 106 (*) 70 - 99 mg/dL   MAGNESIUM     Status: Normal   Collection Time   10/09/11  4:30 PM  Component Value Range Comment   Magnesium 2.2  1.5 - 2.5 mg/dL   CBC     Status: Abnormal    Collection Time   10/09/11  4:30 PM      Component Value Range Comment   WBC 13.2 (*) 4.0 - 10.5 K/uL    RBC 2.91 (*) 4.22 - 5.81 MIL/uL    Hemoglobin 8.8 (*) 13.0 - 17.0 g/dL    HCT 16.1 (*) 09.6 - 52.0 %    MCV 83.5  78.0 - 100.0 fL    MCH 30.2  26.0 - 34.0 pg    MCHC 36.2 (*) 30.0 - 36.0 g/dL    RDW 04.5 (*) 40.9 - 15.5 %    Platelets 72 (*) 150 - 400 K/uL CONSISTENT WITH PREVIOUS RESULT  CREATININE, SERUM     Status: Abnormal   Collection Time   10/09/11  4:30 PM      Component Value Range Comment   Creatinine, Ser 3.33 (*) 0.50 - 1.35 mg/dL    GFR calc non Af Amer 19 (*) >90 mL/min    GFR calc Af Amer 22 (*) >90 mL/min   POCT I-STAT, CHEM 8     Status: Abnormal   Collection Time   10/09/11  4:41 PM      Component Value Range Comment   Sodium 139  135 - 145 mEq/L    Potassium 3.3 (*) 3.5 - 5.1 mEq/L    Chloride 95 (*) 96 - 112 mEq/L    BUN 36 (*) 6 - 23 mg/dL    Creatinine, Ser 8.11 (*) 0.50 - 1.35 mg/dL    Glucose, Bld 914 (*) 70 - 99 mg/dL    Calcium, Ion 7.82 (*) 1.12 - 1.23 mmol/L    TCO2 27  0 - 100 mmol/L    Hemoglobin 8.2 (*) 13.0 - 17.0 g/dL    HCT 95.6 (*) 21.3 - 52.0 %   PREPARE RBC (CROSSMATCH)     Status: Normal   Collection Time   10/09/11  5:19 PM      Component Value Range Comment   Order Confirmation ORDER PROCESSED BY BLOOD BANK     GLUCOSE, CAPILLARY     Status: Abnormal   Collection Time   10/09/11  5:57 PM      Component Value Range Comment   Glucose-Capillary 106 (*) 70 - 99 mg/dL   GLUCOSE, CAPILLARY     Status: Normal   Collection Time   10/09/11  6:50 PM      Component Value Range Comment   Glucose-Capillary 98  70 - 99 mg/dL   GLUCOSE, CAPILLARY     Status: Abnormal   Collection Time   10/09/11  8:00 PM      Component Value Range Comment   Glucose-Capillary 118 (*) 70 - 99 mg/dL   PREPARE RBC (CROSSMATCH)     Status: Normal   Collection Time   10/09/11  9:00 PM      Component Value Range Comment   Order Confirmation ORDER PROCESSED BY  BLOOD BANK     GLUCOSE, CAPILLARY     Status: Abnormal   Collection Time   10/09/11  9:02 PM      Component Value Range Comment   Glucose-Capillary 109 (*) 70 - 99 mg/dL   GLUCOSE, CAPILLARY     Status: Normal   Collection Time   10/09/11  9:53 PM      Component Value Range Comment   Glucose-Capillary 99  70 -  99 mg/dL   GLUCOSE, CAPILLARY     Status: Abnormal   Collection Time   10/09/11 11:04 PM      Component Value Range Comment   Glucose-Capillary 105 (*) 70 - 99 mg/dL   GLUCOSE, CAPILLARY     Status: Abnormal   Collection Time   10/10/11 12:01 AM      Component Value Range Comment   Glucose-Capillary 100 (*) 70 - 99 mg/dL   GLUCOSE, CAPILLARY     Status: Abnormal   Collection Time   10/10/11 12:59 AM      Component Value Range Comment   Glucose-Capillary 114 (*) 70 - 99 mg/dL   GLUCOSE, CAPILLARY     Status: Abnormal   Collection Time   10/10/11  1:56 AM      Component Value Range Comment   Glucose-Capillary 102 (*) 70 - 99 mg/dL   GLUCOSE, CAPILLARY     Status: Normal   Collection Time   10/10/11  2:55 AM      Component Value Range Comment   Glucose-Capillary 87  70 - 99 mg/dL   GLUCOSE, CAPILLARY     Status: Normal   Collection Time   10/10/11  3:54 AM      Component Value Range Comment   Glucose-Capillary 85  70 - 99 mg/dL   PROTIME-INR     Status: Abnormal   Collection Time   10/10/11  4:00 AM      Component Value Range Comment   Prothrombin Time 23.8 (*) 11.6 - 15.2 seconds    INR 2.09 (*) 0.00 - 1.49   CBC     Status: Abnormal   Collection Time   10/10/11  4:00 AM      Component Value Range Comment   WBC 15.2 (*) 4.0 - 10.5 K/uL    RBC 3.27 (*) 4.22 - 5.81 MIL/uL    Hemoglobin 9.8 (*) 13.0 - 17.0 g/dL    HCT 16.1 (*) 09.6 - 52.0 %    MCV 84.1  78.0 - 100.0 fL    MCH 30.0  26.0 - 34.0 pg    MCHC 35.6  30.0 - 36.0 g/dL    RDW 04.5 (*) 40.9 - 15.5 %    Platelets 47 (*) 150 - 400 K/uL   COMPREHENSIVE METABOLIC PANEL     Status: Abnormal   Collection Time    10/10/11  4:00 AM      Component Value Range Comment   Sodium 137  135 - 145 mEq/L    Potassium 3.2 (*) 3.5 - 5.1 mEq/L    Chloride 95 (*) 96 - 112 mEq/L    CO2 28  19 - 32 mEq/L    Glucose, Bld 89  70 - 99 mg/dL    BUN 43 (*) 6 - 23 mg/dL    Creatinine, Ser 8.11 (*) 0.50 - 1.35 mg/dL    Calcium 7.9 (*) 8.4 - 10.5 mg/dL    Total Protein 5.5 (*) 6.0 - 8.3 g/dL    Albumin 2.5 (*) 3.5 - 5.2 g/dL    AST 914 (*) 0 - 37 U/L    ALT 1207 (*) 0 - 53 U/L    Alkaline Phosphatase 93  39 - 117 U/L    Total Bilirubin 1.5 (*) 0.3 - 1.2 mg/dL    GFR calc non Af Amer 19 (*) >90 mL/min    GFR calc Af Amer 22 (*) >90 mL/min   APTT     Status: Abnormal  Collection Time   10/10/11  4:00 AM      Component Value Range Comment   aPTT 42 (*) 24 - 37 seconds   GLUCOSE, CAPILLARY     Status: Abnormal   Collection Time   10/10/11  7:34 AM      Component Value Range Comment   Glucose-Capillary 103 (*) 70 - 99 mg/dL    Comment 1 Documented in Chart      Comment 2 Notify RN     APTT     Status: Abnormal   Collection Time   10/10/11  8:53 AM      Component Value Range Comment   aPTT 41 (*) 24 - 37 seconds   GLUCOSE, CAPILLARY     Status: Abnormal   Collection Time   10/10/11 11:56 AM      Component Value Range Comment   Glucose-Capillary 131 (*) 70 - 99 mg/dL    Comment 1 Documented in Chart      Comment 2 Notify RN     GLUCOSE, CAPILLARY     Status: Abnormal   Collection Time   10/10/11  3:35 PM      Component Value Range Comment   Glucose-Capillary 134 (*) 70 - 99 mg/dL    Comment 1 Documented in Chart      Comment 2 Notify RN     BASIC METABOLIC PANEL     Status: Abnormal   Collection Time   10/10/11  4:15 PM      Component Value Range Comment   Sodium 139  135 - 145 mEq/L    Potassium 3.5  3.5 - 5.1 mEq/L    Chloride 97  96 - 112 mEq/L    CO2 28  19 - 32 mEq/L    Glucose, Bld 151 (*) 70 - 99 mg/dL    BUN 48 (*) 6 - 23 mg/dL    Creatinine, Ser 1.61 (*) 0.50 - 1.35 mg/dL    Calcium 7.9 (*)  8.4 - 10.5 mg/dL    GFR calc non Af Amer 20 (*) >90 mL/min    GFR calc Af Amer 23 (*) >90 mL/min   GLUCOSE, CAPILLARY     Status: Abnormal   Collection Time   10/10/11  7:28 PM      Component Value Range Comment   Glucose-Capillary 122 (*) 70 - 99 mg/dL    Comment 1 Documented in Chart      Comment 2 Notify RN     GLUCOSE, CAPILLARY     Status: Abnormal   Collection Time   10/10/11 11:29 PM      Component Value Range Comment   Glucose-Capillary 125 (*) 70 - 99 mg/dL   PROTIME-INR     Status: Abnormal   Collection Time   10/11/11  3:49 AM      Component Value Range Comment   Prothrombin Time 26.7 (*) 11.6 - 15.2 seconds    INR 2.42 (*) 0.00 - 1.49   COMPREHENSIVE METABOLIC PANEL     Status: Abnormal   Collection Time   10/11/11  3:49 AM      Component Value Range Comment   Sodium 139  135 - 145 mEq/L    Potassium 3.1 (*) 3.5 - 5.1 mEq/L    Chloride 98  96 - 112 mEq/L    CO2 27  19 - 32 mEq/L    Glucose, Bld 136 (*) 70 - 99 mg/dL    BUN 50 (*) 6 - 23 mg/dL  Creatinine, Ser 2.78 (*) 0.50 - 1.35 mg/dL    Calcium 8.3 (*) 8.4 - 10.5 mg/dL    Total Protein 6.0  6.0 - 8.3 g/dL    Albumin 2.6 (*) 3.5 - 5.2 g/dL    AST 956 (*) 0 - 37 U/L    ALT 863 (*) 0 - 53 U/L    Alkaline Phosphatase 110  39 - 117 U/L    Total Bilirubin 1.3 (*) 0.3 - 1.2 mg/dL    GFR calc non Af Amer 23 (*) >90 mL/min    GFR calc Af Amer 27 (*) >90 mL/min   CBC     Status: Abnormal   Collection Time   10/11/11  3:49 AM      Component Value Range Comment   WBC 17.2 (*) 4.0 - 10.5 K/uL    RBC 3.37 (*) 4.22 - 5.81 MIL/uL    Hemoglobin 9.9 (*) 13.0 - 17.0 g/dL    HCT 21.3 (*) 08.6 - 52.0 %    MCV 85.5  78.0 - 100.0 fL    MCH 29.4  26.0 - 34.0 pg    MCHC 34.4  30.0 - 36.0 g/dL    RDW 57.8 (*) 46.9 - 15.5 %    Platelets 31 (*) 150 - 400 K/uL CONSISTENT WITH PREVIOUS RESULT  GLUCOSE, CAPILLARY     Status: Abnormal   Collection Time   10/11/11  3:57 AM      Component Value Range Comment   Glucose-Capillary 128  (*) 70 - 99 mg/dL   GLUCOSE, CAPILLARY     Status: Abnormal   Collection Time   10/11/11  7:21 AM      Component Value Range Comment   Glucose-Capillary 113 (*) 70 - 99 mg/dL    Comment 1 Documented in Chart      Comment 2 Notify RN     PREPARE PLATELET PHERESIS     Status: Normal (Preliminary result)   Collection Time   10/11/11  8:11 AM      Component Value Range Comment   Unit Number [id number]      Blood Component Type PLTPHER LR1      Unit division 00      Status of Unit ISSUED      Transfusion Status OK TO TRANSFUSE     PREPARE FRESH FROZEN PLASMA     Status: Normal (Preliminary result)   Collection Time   10/11/11 10:58 AM      Component Value Range Comment   Unit Number [id number]      Blood Component Type FPT, CRYO DEP      Unit division 00      Status of Unit ALLOCATED      Transfusion Status OK TO TRANSFUSE      Unit Number [id number]      Blood Component Type FPT, CRYO DEP      Unit division 00      Status of Unit ALLOCATED      Transfusion Status OK TO TRANSFUSE      Unit Number [id number]      Blood Component Type THAWED PLASMA      Unit division 00      Status of Unit ISSUED      Transfusion Status OK TO TRANSFUSE      Unit Number [id number]      Blood Component Type THAWED PLASMA      Unit division 00      Status of Unit ISSUED  Transfusion Status OK TO TRANSFUSE     POCT I-STAT 3, BLOOD GAS (G3+)     Status: Abnormal   Collection Time   10/11/11 11:06 AM      Component Value Range Comment   pH, Arterial 7.547 (*) 7.350 - 7.450    pCO2 arterial 34.4 (*) 35.0 - 45.0 mmHg    pO2, Arterial 103.0 (*) 80.0 - 100.0 mmHg    Bicarbonate 30.0 (*) 20.0 - 24.0 mEq/L    TCO2 31  0 - 100 mmol/L    O2 Saturation 99.0      Acid-Base Excess 7.0 (*) 0.0 - 2.0 mmol/L    Patient temperature 36.3 C      Collection site ARTERIAL LINE      Drawn by RT      Sample type ARTERIAL     GLUCOSE, CAPILLARY     Status: Normal   Collection Time   10/11/11 11:44 AM      Component  Value Range Comment   Glucose-Capillary 99  70 - 99 mg/dL    Comment 1 Documented in Chart      Comment 2 Notify RN     POCT I-STAT, CHEM 8     Status: Abnormal   Collection Time   10/11/11  2:37 PM      Component Value Range Comment   Sodium 139  135 - 145 mEq/L    Potassium 3.1 (*) 3.5 - 5.1 mEq/L    Chloride 99  96 - 112 mEq/L    BUN 49 (*) 6 - 23 mg/dL    Creatinine, Ser 1.61 (*) 0.50 - 1.35 mg/dL    Glucose, Bld 096 (*) 70 - 99 mg/dL    Calcium, Ion 0.45  4.09 - 1.23 mmol/L    TCO2 25  0 - 100 mmol/L    Hemoglobin 8.8 (*) 13.0 - 17.0 g/dL    HCT 81.1 (*) 91.4 - 52.0 %    Dg Chest Port 1 View  10/11/2011  *RADIOLOGY REPORT*  Clinical Data: Respiratory stress  PORTABLE CHEST - 1 VIEW  Comparison: Chest radiograph 10/11/2011  Findings: Endotracheal tube, NG tube, Swan-Ganz catheter unchanged. Stable enlarged heart silhouette.  There is perihilar air space disease not changed from prior.  Left lower lobe atelectasis and effusions unchanged.  No pneumothorax.  IMPRESSION:  1.  Stable support apparatus.  No significant change. 2.  Bilateral pulmonary edema, left lower lobe atelectasis and effusion.  Call report  Original Report Authenticated By: Genevive Bi, M.D.   Dg Chest Port 1 View  10/11/2011  *RADIOLOGY REPORT*  Clinical Data: 61 year old male status post mediastinal exploration.  Chest pain shortness of breath.  Ruptured aortic pseudoaneurysm repair.  PORTABLE CHEST - 1 VIEW  Comparison: 10/10/2011 and earlier.  Findings: Semi upright AP portable view at 0549 hours.  Stable endotracheal tube tip at the level of clavicles.  Right IJ approach Swan-Ganz catheter, tip at the level of the main pulmonary artery. Enteric tube courses to the abdomen, side hole the level the stomach.  Stable lung volumes and mediastinal contours.  Patchy indistinct bilateral basilar predominant opacity has progressively increased since 10/08/2011, also slightly progressed since yesterday.  No pneumothorax or  definite effusion.  IMPRESSION: 1. Stable lines and tubes. 2.  Continued increase in basilar predominant patchy opacity which could reflect edema or infection.  Original Report Authenticated By: Harley Hallmark, M.D.   Dg Chest Port 1 View  10/10/2011  *RADIOLOGY REPORT*  Clinical Data: None of  post mediastinal exploration.  History of ruptured thoracic aortic pseudoaneurysm.  PORTABLE CHEST - 1 VIEW  Comparison: 10/09/2011.  Findings: Tip of endotracheal tube terminates 4.5 cm above the carina.  Enteric tube enters area of stomach.  Tip is not included on image.  Tip of Swan Ganz catheter is in the main pulmonary artery region.  Chest drains are in unchanged position.  There is stable cardiac silhouette enlargement.  Mediastinal and hilar contours appear stable.  Previous median sternotomy and valvular replacement procedures have been performed.  No evidence of pneumothorax.  Vascular congestion pattern accentuated by semi- erect positioning.  Interval increase in basilar hazy opacity bilaterally with atelectasis, infiltrative densities, and possible elements of pleural effusion.  IMPRESSION: Stable support apparatus detailed above.  Stable cardiac silhouette enlargement.  Stable mediastinal contours.  Vascular congestion edema pattern.  Interval increase in basilar hazy opacity bilaterally with atelectasis, infiltrative densities, and possible elements of pleural effusion.  Original Report Authenticated By: Crawford Givens, M.D.    Review of Systems  Constitutional: Positive for fever and malaise/fatigue.  Respiratory: Positive for shortness of breath.   Cardiovascular: Positive for chest pain.  Neurological: Positive for weakness.    Blood pressure 133/81, pulse 79, temperature 97.5 F (36.4 C), temperature source Core (Comment), resp. rate 15, height 6' (1.829 m), weight 226 lb 6.6 oz (102.7 kg), SpO2 97.00%. Physical Exam  Vitals reviewed. Constitutional: He appears distressed.       Pale but alert    HENT:  Head: Normocephalic and atraumatic.  Eyes: Pupils are equal, round, and reactive to light.  Cardiovascular:       Tachycardic Good valve click Faint distal pulses  Respiratory: Breath sounds normal.  GI: Soft. There is no tenderness.  Neurological: He is alert.       No obvious focal deficit to limited exam  Skin:       Cool pale extremities    IMAGING CT ang[facility] from [facility] reviewed. Finding of an ascending aortic pseudoaneurysm with large mediastinal hematoma c/w contained rupture.  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 He was in shock, hypovolemic +/- cardiogenic, on arrival. He needs urgent surgical repair. This is a high risk surgical procedure but alternative is near certain death. He was informed of the need for surgical repair with the plan to cannulate the axillary artery and femoral vein and go on bypass before opening chest. This is extremely high risk for significant morbidity and mortality. He understands risks of death, stroke, MI, bleeding, need for transfusions, infection, respiratory failure, renal failure, GI complications. He agrees to proceed.  Mussa Groesbeck C 10/11/2011, 2:59 PM

## 2011-10-11 NOTE — Progress Notes (Addendum)
3 Days Post-Op Procedure(s) (LRB): MEDIASTINAL EXPLORATION (N/A) Subjective: Intubated and sedated Some agitation over night  Objective: Vital signs in last 24 hours: Temp:  [97.9 F (36.6 C)-99.1 F (37.3 C)] 97.9 F (36.6 C) (07/12 0700) Pulse Rate:  [83-104] 86  (07/12 0700) Cardiac Rhythm:  [-] Normal sinus rhythm (07/12 0600) Resp:  [15-17] 15  (07/12 0700) BP: (89-126)/(52-71) 92/63 mmHg (07/12 0700) SpO2:  [92 %-97 %] 97 % (07/12 0700) Arterial Line BP: (108-143)/(52-70) 115/62 mmHg (07/12 0700) FiO2 (%):  [60 %-70 %] 60 % (07/12 0600) Weight:  [226 lb 6.6 oz (102.7 kg)] 226 lb 6.6 oz (102.7 kg) (07/12 0600)  Hemodynamic parameters for last 24 hours: PAP: (21-34)/(12-25) 25/16 mmHg CO:  [3.9 L/min-4.5 L/min] 4 L/min CI:  [1.9 L/min/m2-2.1 L/min/m2] 1.9 L/min/m2  Intake/Output from previous day: 07/11 0701 - 07/12 0700 In: 3177.6 [I.V.:2777.6; NG/GT:150; IV Piggyback:250] Out: 6055 [Urine:5605; Emesis/NG output:450] Intake/Output this shift:    Neurologic: moves all 4, did not follow commands last night, sedated now Heart: regular rate and rhythm Lungs: bronchophony bilaterally Abdomen: mildly distended, + BS Extremities: warm Wound: minimal serosanguinous drainage  Lab Results:  Basename 10/11/11 0349 10/10/11 0400  WBC 17.2* 15.2*  HGB 9.9* 9.8*  HCT 28.8* 27.5*  PLT 31* 47*   BMET:  Basename 10/11/11 0349 10/10/11 1615  NA 139 139  K 3.1* 3.5  CL 98 97  CO2 27 28  GLUCOSE 136* 151*  BUN 50* 48*  CREATININE 2.78* 3.20*  CALCIUM 8.3* 7.9*    PT/INR:  Basename 10/11/11 0349  LABPROT 26.7*  INR 2.42*   ABG    Component Value Date/Time   PHART 7.550* 10/09/2011 1416   HCO3 30.7* 10/09/2011 1416   TCO2 27 10/09/2011 1641   ACIDBASEDEF 9.0* 10/07/2011 1801   O2SAT 98.0 10/09/2011 1416   CBG (last 3)   Basename 10/11/11 0721 10/11/11 0357 10/10/11 2329  GLUCAP 113* 128* 125*    Assessment/Plan: S/P Procedure(s) (LRB): MEDIASTINAL EXPLORATION  (N/A) - CV- hemodynamics stable, will try to wean milrinone today  Wean dopamine to 5 if BP tolerates  MECHANICAL AVR- INR 2.4 without coumadin  RESP- Ventilator dependent respiratory failure secondary to shock/ surgery  CXR shows more edema today, PEEP decreased may be reason it is more apparent  Wean FiO2 as tolerated  I don't think he's ready to wean from vent yet, hopefully if we can get some more volume off he will be soon  RENAL- resolving ATN, in nonoliguric phase, diuresing well, creatinine decreasing  HYPOKALEMIA- supplement  THROMBOCYTOPENIA- PLT count down to 31K, not receiving heparin- will transfuse, check HIT panel  ANEMIA- secondary to acute blood loss  NUTRITION- will place panda today, no feeds until postpyloric  Remains critically ill   LOS: 4 days    Liadan Guizar C 10/11/2011

## 2011-10-11 NOTE — Clinical Documentation Improvement (Signed)
RESPIRATORY FAILURE DOCUMENTATION CLARIFICATION QUERY   THIS DOCUMENT IS NOT A PERMANENT PART OF THE MEDICAL RECORD  TO RESPOND TO THE THIS QUERY, FOLLOW THE INSTRUCTIONS BELOW:  1. If needed, update documentation for the patient's encounter via the notes activity.  2. Access this query again and click edit on the In [employer].  3. After updating, or not, click F2 to complete all highlighted (required) fields concerning your review. Select "additional documentation in the medical record" OR "no additional documentation provided".  4. Click Sign note button.  5. The deficiency will fall out of your In Basket *Please let us know if you are not able to complete this workflow by phone or e-mail (listed below).  Please update your documentation within the medical record to reflect your response to this query.                                                                                    10/11/11  Dear Dr.Uva Runkel Marton Redwood,  In a better effort to capture your patient's severity of illness, reflect appropriate length of stay and utilization of resources, a review of the patient medical record has revealed the following indicators.    Based on your clinical judgment, please clarify and document in a progress note and/or discharge summary the clinical condition associated with the following supporting information:  In responding to this query please exercise your independent judgment.  The fact that a query is asked, does not imply that any particular answer is desired or expected.  Possible Clinical Conditions?  _______Acute Respiratory Failure  _______Vent Dependent Respiratory Failure following surgery or trauma  _______Other Condition  _______Cannot Clinically Determine    Supporting Information:  Risk Factors: Patient remains intubated and sedated, not ready to wean from vent yet, wean FiO2 as tolerated per 7/12 progress notes.                 You may use possible,  probable, or suspect with inpatient documentation. possible, probable, suspected diagnoses MUST be documented at the time of discharge  Reviewed: additional documentation in the medical record  Thank You,  Marciano Sequin,  Clinical Documentation Specialist:  Pager: [phone]  Health Information Management [place]

## 2011-10-11 NOTE — Progress Notes (Signed)
Patient ID: [name], male   DOB: Jun 06, 1950, 61 y.o.   MRN: [id number]  Filed Vitals:   10/11/11 1600 10/11/11 1700 10/11/11 1800 10/11/11 1900  BP: 93/64 119/74 130/79 132/79  Pulse: 68 73 73 72  Temp: 97.2 F (36.2 C) 97 F (36.1 C) 96.8 F (36 C) 96.8 F (36 C)  TempSrc:      Resp: 15 15 15 15   Height:      Weight:      SpO2: 94% 95% 97% 97%   Remains on dopamine 5 and milrinone 0.2.  CI= 1.7  Sedated on vent  Urine output good on lasix drip  BMET    Component Value Date/Time   NA 139 10/11/2011 1437   K 3.1* 10/11/2011 1437   CL 99 10/11/2011 1437   CO2 27 10/11/2011 0349   GLUCOSE 110* 10/11/2011 1437   BUN 49* 10/11/2011 1437   CREATININE 2.70* 10/11/2011 1437   CALCIUM 8.3* 10/11/2011 0349   GFRNONAA 23* 10/11/2011 0349   GFRAA 27* 10/11/2011 0349    CBC    Component Value Date/Time   WBC 17.2* 10/11/2011 0349   RBC 3.37* 10/11/2011 0349   HGB 8.8* 10/11/2011 1437   HCT 26.0* 10/11/2011 1437   PLT 31* 10/11/2011 0349   MCV 85.5 10/11/2011 0349   MCH 29.4 10/11/2011 0349   MCHC 34.4 10/11/2011 0349   RDW 15.6* 10/11/2011 0349   LYMPHSABS 2.7 04/29/2011 1030   MONOABS 0.6 04/29/2011 1030   EOSABS 0.1 04/29/2011 1030   BASOSABS 0.0 04/29/2011 1030    Given platelets and FFP today for coagulopathy.  A/P: Remains critically ill with MSOF but improving trend.  Replace K+

## 2011-10-11 NOTE — Progress Notes (Signed)
Pt's SPB dropped to 60's- sustaining with correlation in a 3 BP locations. Pt paced at AAI 90 anf drips increased to maximum. MD Hedrickson paged to make aware. New orders recieved

## 2011-10-11 NOTE — Progress Notes (Signed)
TCTS BRIEF PROGRESS NOTE   Called to see patient because of relatively sudden drop in BP Patient sedated on vent NSR - now AAI paced BP 80/40 now up a little following volume administration PA pressures quite low C.I. 1.5  Breath sounds symmetrical Abdomen soft Extremities warm, well perfused UOP > 300 mL/hr, I/O's negative  IMP - I think he's intravascularly volume-depleted.  He is responding to volume replacement  PLAN - stop lasix drip Continue volume replacement with blood products as ordered by Dr Remo Lipps H 10/11/2011 11:09 AM

## 2011-10-11 NOTE — Progress Notes (Signed)
Approx 1 hour After Panda placement, pt had frank blood coming from mouth and nose. Nose packed with gauze and mouth suctioned. MD Dorris Fetch called to make aware- no new orders- will monitor. Bleeding stopped 20 minutes after event.  MD hendrickson gave VO to hold additional 2 units of FFP out of the 4 ordered  [name]

## 2011-10-11 NOTE — Progress Notes (Signed)
Clinical Social Work-CSW received referral for SNF Placement as noted on 10/09/11-CSW has been unable to meet with pt or family due to pt declining and no family at bedside-CSW will follow peripherally for psychosocial needs as they arise-Lakyn Alsteen-MSW, [phone]

## 2011-10-11 NOTE — Care Management Note (Signed)
    Page 1 of 2   10/23/2011     4:17:17 PM   CARE MANAGEMENT NOTE 10/23/2011  Patient:  [name], [name]   Account Number:  [id number]  Date Initiated:  10/11/2011  Documentation initiated by:  [name]  Subjective/Objective Assessment:   PT S/P OPEN REPAIR OF RUPTURED PSEUDOANEURYSM ON 10/08/11. PTA, PT REPORTEDLY LIVES WITH HIS DAUGHTER.     Action/Plan:   PT REMAINS GRAVELY ILL ON THE VENTILATOR AT THIS TIME. WILL EVALUATE FOR DISCHARGE NEEDS WHEN CONDITION MORE STABLE. CSW FOLLOWING TO ASSIST, IF NEEDED.   Anticipated DC Date:  10/21/2011   Anticipated DC Plan:  HOME W HOME HEALTH SERVICES  In-house referral  Clinical Social Worker      DC Planning Services  CM consult      [facility] Choice  HOME HEALTH   Choice offered to / List presented to:  C-4 Adult Children   DME arranged  [facility]ALK[facility] - [name]      DME agency  [facility]     HH arranged  HH-1 RN  HH-2 PT      [facility] agency  [facility]   Status of service:  Completed, signed off Medicare Important Message given?   (If response is "NO", the following Medicare IM given date fields will be blank) Date Medicare IM given:   Date Additional Medicare IM given:    Discharge Disposition:  HOME W HOME HEALTH SERVICES  Per UR Regulation:  Reviewed for med. necessity/level of care/duration of stay  If discussed at Long Length of Stay Meetings, dates discussed:    Comments:  10/23/11 Allen Riggan,RN,BSN 1600 [name] CALLED BACK, STATES THAT HIS SISTER WILL NOW TAKE PT HOME; STATES 24HR CARE WILL BE PROVIDED.  DISCUSSED HOME CARE ARRANGEMENTS.  REFERRAL TO [facility] FOR HOME HEALTH AND DME NEEDS. DC ADDRESS [facility] 'S HOME):  [name]   [address]  [place], [place] [address]   PHONE [phone].  [name] TO ARRIVE AFTER 5PM TO TAKE PT HOME.   10/23/11 Allen Slone,RN,BSN 1530 STILL NO WORD FROM INSURANCE CO.; SPOKE WITH [name], [name], Wright:  HE STATES THAT HE AND HIS SISTER HAD  DISCUSSED THIS PLAN AND IS VERY UNHAPPY THAT SHE IS NOW CHANGING PLANS.  HE STATES HE WILL CALL HER TO DISCUSS IT, AND CALL ME BACK.  10/23/11 Allen Fredericksen,RN,BSN 1000 CALLED DAUGHTER TO DISCUSS HH CHOICE.  SHE STATES SHE IS NOT CERTAIN THAT PT CAN DC TO HER HOME; STATES SHE WORKS 10HR DAYS AND PT WILL NOT HAVE 24HR SUPERVISION.  SHE REQUESTS THAT WE INVESTIGATE POSSIBLE SNF AS AN OPTION, THOUGH LIKELY AT THIS POINT SNF WILL NOT BE COVERED BY INSURANCE.  WILL DEFER TO CSW TO CHECK ON THIS.  10/17/11 Allen Baskin,RN,BSN PT NOW PLANS TO DC HOME WITH DAUGHTER AND FAMILY TO ASSIST WITH CARE.  WILL NEED HOME HEALTH FOLLOW UP AT DC.  10/15/11 Allen Dau,RN,BSN 1130 PT NOW EXTUBATED AND ON DIET.  CSW TO FOLLOW UP WITH FAMILY REGARDING POSSIBLE SKILLED NURSING FACILITY PLACEMENT AT DISCHARGE FOR REHAB.  WILL FOLLOW/ASSIST WITH DC PLANNING.

## 2011-10-12 ENCOUNTER — Inpatient Hospital Stay (HOSPITAL_COMMUNITY): Payer: Medicare Other

## 2011-10-12 LAB — CBC
HCT: 28 % — ABNORMAL LOW (ref 39.0–52.0)
MCH: 29.5 pg (ref 26.0–34.0)
MCV: 87 fL (ref 78.0–100.0)
RDW: 15.2 % (ref 11.5–15.5)
WBC: 13.4 10*3/uL — ABNORMAL HIGH (ref 4.0–10.5)

## 2011-10-12 LAB — COMPREHENSIVE METABOLIC PANEL
Albumin: 2.7 g/dL — ABNORMAL LOW (ref 3.5–5.2)
BUN: 55 mg/dL — ABNORMAL HIGH (ref 6–23)
CO2: 26 mEq/L (ref 19–32)
Calcium: 9 mg/dL (ref 8.4–10.5)
Chloride: 100 mEq/L (ref 96–112)
Creatinine, Ser: 2.02 mg/dL — ABNORMAL HIGH (ref 0.50–1.35)
GFR calc non Af Amer: 34 mL/min — ABNORMAL LOW (ref >90)
Total Bilirubin: 1.2 mg/dL (ref 0.3–1.2)

## 2011-10-12 LAB — PREPARE FRESH FROZEN PLASMA
Unit division: 0
Unit division: 0
Unit division: 0

## 2011-10-12 LAB — POCT I-STAT, CHEM 8
BUN: 49 mg/dL — ABNORMAL HIGH (ref 6–23)
Creatinine, Ser: 1.9 mg/dL — ABNORMAL HIGH (ref 0.50–1.35)
Potassium: 3.4 mEq/L — ABNORMAL LOW (ref 3.5–5.1)
Sodium: 139 mEq/L (ref 135–145)

## 2011-10-12 LAB — POCT I-STAT 4, (NA,K, GLUC, HGB,HCT)
Glucose, Bld: 141 mg/dL — ABNORMAL HIGH (ref 70–99)
HCT: 28 % — ABNORMAL LOW (ref 39.0–52.0)
Hemoglobin: 9.5 g/dL — ABNORMAL LOW (ref 13.0–17.0)
Sodium: 141 mEq/L (ref 135–145)

## 2011-10-12 LAB — PREPARE PLATELET PHERESIS

## 2011-10-12 MED ORDER — POTASSIUM CHLORIDE 10 MEQ/50ML IV SOLN
INTRAVENOUS | Status: AC
  Administered 2011-10-12: 10 meq via INTRAVENOUS
  Filled 2011-10-12: qty 50

## 2011-10-12 MED ORDER — PRO-STAT SUGAR FREE PO LIQD
30.0000 mL | Freq: Three times a day (TID) | ORAL | Status: DC
  Administered 2011-10-12 – 2011-10-13 (×4): 30 mL
  Filled 2011-10-12 (×9): qty 30

## 2011-10-12 MED ORDER — POTASSIUM CHLORIDE 10 MEQ/50ML IV SOLN
10.0000 meq | INTRAVENOUS | Status: AC
  Administered 2011-10-12 (×3): 10 meq via INTRAVENOUS
  Filled 2011-10-12: qty 150

## 2011-10-12 MED ORDER — PANTOPRAZOLE SODIUM 40 MG PO PACK
40.0000 mg | PACK | Freq: Every day | ORAL | Status: DC
  Administered 2011-10-12 – 2011-10-16 (×3): 40 mg
  Filled 2011-10-12 (×6): qty 20

## 2011-10-12 MED ORDER — POTASSIUM CHLORIDE 10 MEQ/50ML IV SOLN
INTRAVENOUS | Status: AC
  Administered 2011-10-12: 10 meq via INTRAVENOUS
  Filled 2011-10-12: qty 150

## 2011-10-12 MED ORDER — POTASSIUM CHLORIDE 10 MEQ/50ML IV SOLN
10.0000 meq | INTRAVENOUS | Status: AC
  Administered 2011-10-12 (×2): 10 meq via INTRAVENOUS
  Filled 2011-10-12 (×2): qty 50

## 2011-10-12 MED ORDER — POTASSIUM CHLORIDE 10 MEQ/50ML IV SOLN
INTRAVENOUS | Status: AC
  Filled 2011-10-12: qty 150

## 2011-10-12 MED ORDER — POTASSIUM CHLORIDE 10 MEQ/50ML IV SOLN
10.0000 meq | INTRAVENOUS | Status: AC | PRN
  Administered 2011-10-12 (×3): 10 meq via INTRAVENOUS

## 2011-10-12 MED ORDER — POTASSIUM CHLORIDE 10 MEQ/50ML IV SOLN
10.0000 meq | INTRAVENOUS | Status: AC | PRN
  Administered 2011-10-12 (×2): 10 meq via INTRAVENOUS

## 2011-10-12 MED ORDER — POTASSIUM CHLORIDE 10 MEQ/50ML IV SOLN
10.0000 meq | INTRAVENOUS | Status: AC | PRN
  Administered 2011-10-12 – 2011-10-13 (×3): 10 meq via INTRAVENOUS
  Filled 2011-10-12 (×2): qty 50

## 2011-10-12 MED ORDER — OSMOLITE 1.5 CAL PO LIQD
1000.0000 mL | ORAL | Status: DC
  Administered 2011-10-12 – 2011-10-13 (×2): 1000 mL
  Filled 2011-10-12 (×4): qty 1000

## 2011-10-12 NOTE — Progress Notes (Signed)
Patient ID: [name], male   DOB: 1951/01/21, 61 y.o.   MRN: [id number]  Filed Vitals:   10/12/11 1700 10/12/11 1800 10/12/11 1900 10/12/11 2000  BP: 101/66 105/66 103/66 104/66  Pulse: 73 72 73 74  Temp: 98.6 F (37 C) 98.6 F (37 C) 98.6 F (37 C) 98.6 F (37 C)  TempSrc:    Core (Comment)  Resp: 15 15 15 15   Height:      Weight:      SpO2: 98% 98% 99% 99%   CI= 1.5 on dop and milrinone  Diuresing well on lasix drip.  Replacing K+  Remains on vent

## 2011-10-12 NOTE — Progress Notes (Signed)
4 Days Post-Op Procedure(s) (LRB): MEDIASTINAL EXPLORATION (N/A) Subjective: Intubated and sedated  Objective: Vital signs in last 24 hours: Temp:  [96.8 F (36 C)-98.4 F (36.9 C)] 98.4 F (36.9 C) (07/13 1000) Pulse Rate:  [66-93] 68  (07/13 1000) Cardiac Rhythm:  [-] Normal sinus rhythm (07/13 0800) Resp:  [14-24] 15  (07/13 1000) BP: (64-138)/(43-86) 81/52 mmHg (07/13 1000) SpO2:  [94 %-100 %] 98 % (07/13 1000) Arterial Line BP: (63-175)/(38-93) 152/83 mmHg (07/12 1400) FiO2 (%):  [60 %-100 %] 60 % (07/13 0900) Weight:  [101.1 kg (222 lb 14.2 oz)] 101.1 kg (222 lb 14.2 oz) (07/13 0500)  Hemodynamic parameters for last 24 hours: PAP: (18-30)/(14-22) 27/19 mmHg CO:  [3 L/min-3.8 L/min] 3.6 L/min CI:  [1.4 L/min/m2-1.8 L/min/m2] 1.7 L/min/m2  Intake/Output from previous day: 07/12 0701 - 07/13 0700 In: 2976.6 [I.V.:2061.6; Blood:45; NG/GT:120; IV Piggyback:750] Out: 4920 [Urine:4520; Emesis/NG output:400] Intake/Output this shift: Total I/O In: 299.5 [I.V.:269.5; NG/GT:30] Out: 575 [Urine:575]  General appearance: intubated and sedated Heart: regular rate and rhythm and crisp valve click Lungs: rales bilaterally Abdomen: hypoactive bowel sounds, protuberant Extremities: edema moderate peripheral Wound: serosanguinous drainage from all  Lab Results:  Basename 10/12/11 0401 10/11/11 1437 10/11/11 0349  WBC 13.4* -- 17.2*  HGB 9.5* 8.8* --  HCT 28.0* 26.0* --  PLT 38* -- 31*   BMET:  Basename 10/12/11 0401 10/11/11 1437 10/11/11 0349  NA 139 139 --  K 3.1* 3.1* --  CL 100 99 --  CO2 26 -- 27  GLUCOSE 104* 110* --  BUN 55* 49* --  CREATININE 2.02* 2.70* --  CALCIUM 9.0 -- 8.3*    PT/INR:  Basename 10/12/11 0401  LABPROT 23.9*  INR 2.10*   ABG    Component Value Date/Time   PHART 7.547* 10/11/2011 1106   HCO3 30.0* 10/11/2011 1106   TCO2 25 10/11/2011 1437   ACIDBASEDEF 9.0* 10/07/2011 1801   O2SAT 99.0 10/11/2011 1106   CBG (last 3)   Basename  10/12/11 0717 10/12/11 0350 10/11/11 2336  GLUCAP 99 92 104*   CXR:  Bilateral patchy air space disease that is likely edema.  Decreased aeration of lung bases compared to yesterday.  Assessment/Plan: Hemodynamics fairly stable on current inotropes with cardiac index 1.7. Will continue these for now. Continue diuretic and kcl replacement. Continue ventilator support until pulmonary edema clears Thrombocytopenia: stable. HIT pending.  No signs of thrombosis. Coagulopathy with INR 2.1. No coumadin at this time. Likely due to shock liver. Panda inserted into duodenum.  Start tube feeds slowly.   LOS: 5 days    Gilmar Bua K 10/12/2011

## 2011-10-13 ENCOUNTER — Inpatient Hospital Stay (HOSPITAL_COMMUNITY): Payer: Medicare Other

## 2011-10-13 LAB — CBC
MCHC: 33.3 g/dL (ref 30.0–36.0)
Platelets: 51 10*3/uL — ABNORMAL LOW (ref 150–400)
RDW: 15.1 % (ref 11.5–15.5)
WBC: 12.5 10*3/uL — ABNORMAL HIGH (ref 4.0–10.5)

## 2011-10-13 LAB — COMPREHENSIVE METABOLIC PANEL
AST: 61 U/L — ABNORMAL HIGH (ref 0–37)
Albumin: 2.5 g/dL — ABNORMAL LOW (ref 3.5–5.2)
Alkaline Phosphatase: 161 U/L — ABNORMAL HIGH (ref 39–117)
Chloride: 103 mEq/L (ref 96–112)
Creatinine, Ser: 1.54 mg/dL — ABNORMAL HIGH (ref 0.50–1.35)
Potassium: 3.4 mEq/L — ABNORMAL LOW (ref 3.5–5.1)
Total Bilirubin: 1 mg/dL (ref 0.3–1.2)
Total Protein: 6.2 g/dL (ref 6.0–8.3)

## 2011-10-13 LAB — GLUCOSE, CAPILLARY: Glucose-Capillary: 109 mg/dL — ABNORMAL HIGH (ref 70–99)

## 2011-10-13 LAB — POCT I-STAT 3, ART BLOOD GAS (G3+)
Bicarbonate: 26.5 mEq/L — ABNORMAL HIGH (ref 20.0–24.0)
Bicarbonate: 27.2 mEq/L — ABNORMAL HIGH (ref 20.0–24.0)
Patient temperature: 36.4
Patient temperature: 36.6
TCO2: 27 mmol/L (ref 0–100)
TCO2: 28 mmol/L (ref 0–100)
pCO2 arterial: 36.6 mmHg (ref 35.0–45.0)
pH, Arterial: 7.546 — ABNORMAL HIGH (ref 7.350–7.450)
pH, Arterial: 7.477 — ABNORMAL HIGH (ref 7.350–7.450)
pO2, Arterial: 75 mmHg — ABNORMAL LOW (ref 80.0–100.0)
pO2, Arterial: 71 mmHg — ABNORMAL LOW (ref 80.0–100.0)

## 2011-10-13 LAB — PROTIME-INR: INR: 2.79 — ABNORMAL HIGH (ref 0.00–1.49)

## 2011-10-13 MED ORDER — POTASSIUM CHLORIDE 10 MEQ/50ML IV SOLN
10.0000 meq | INTRAVENOUS | Status: AC | PRN
  Administered 2011-10-13 (×3): 10 meq via INTRAVENOUS
  Filled 2011-10-13: qty 100

## 2011-10-13 MED ORDER — POTASSIUM CHLORIDE 10 MEQ/50ML IV SOLN
INTRAVENOUS | Status: AC
  Administered 2011-10-13: 10 meq via INTRAVENOUS
  Filled 2011-10-13: qty 50

## 2011-10-13 MED ORDER — POTASSIUM CHLORIDE 20 MEQ/15ML (10%) PO LIQD
40.0000 meq | Freq: Three times a day (TID) | ORAL | Status: DC
  Administered 2011-10-13 (×3): 40 meq
  Filled 2011-10-13 (×9): qty 30

## 2011-10-13 NOTE — Progress Notes (Signed)
5 Days Post-Op Procedure(s) (LRB): MEDIASTINAL EXPLORATION (N/A) Subjective: Intubated on vent  Objective: Vital signs in last 24 hours: Temp:  [97.5 F (36.4 C)-99.1 F (37.3 C)] 98.1 F (36.7 C) (07/14 1000) Pulse Rate:  [65-77] 70  (07/14 1000) Cardiac Rhythm:  [-] Normal sinus rhythm (07/14 0800) Resp:  [12-20] 14  (07/14 1000) BP: (92-144)/(50-72) 95/58 mmHg (07/14 1000) SpO2:  [95 %-100 %] 98 % (07/14 1000) FiO2 (%):  [40 %-60 %] 40 % (07/14 0917) Weight:  [103.2 kg (227 lb 8.2 oz)] 103.2 kg (227 lb 8.2 oz) (07/14 0300)  Hemodynamic parameters for last 24 hours: PAP: (21-31)/(12-25) 22/14 mmHg CO:  [3.1 L/min-3.7 L/min] 3.3 L/min CI:  [1.5 L/min/m2-1.7 L/min/m2] 1.6 L/min/m2  Intake/Output from previous day: 07/13 0701 - 07/14 0700 In: 3376 [I.V.:2006; NG/GT:800; IV Piggyback:550] Out: 4180 [Urine:3930; Emesis/NG output:250] Intake/Output this shift: Total I/O In: 489.5 [I.V.:269.5; NG/GT:120; IV Piggyback:100] Out: 950 [Urine:950]  General appearance: intubated but responds Heart: regular rate and rhythm, S1, S2 normal, no murmur, click, rub or gallop Lungs: rales bilaterally Abdomen: soft, protuberant, few bowel sounds Extremities: edema moderate Wound: dressing dry  Lab Results:  Basename 10/13/11 0443 10/12/11 2318 10/12/11 0401  WBC 12.5* -- 13.4*  HGB 9.6* 9.5* --  HCT 28.8* 28.0* --  PLT 51* -- 38*   BMET:  Basename 10/13/11 0443 10/12/11 2318 10/12/11 1539 10/12/11 0401  NA 141 141 -- --  K 3.4* 3.7 -- --  CL 103 -- 101 --  CO2 25 -- -- 26  GLUCOSE 124* 141* -- --  BUN 53* -- 49* --  CREATININE 1.54* -- 1.90* --  CALCIUM 8.8 -- -- 9.0    PT/INR:  Basename 10/13/11 0443  LABPROT 29.9*  INR 2.79*   ABG    Component Value Date/Time   PHART 7.547* 10/11/2011 1106   HCO3 30.0* 10/11/2011 1106   TCO2 25 10/12/2011 1539   ACIDBASEDEF 9.0* 10/07/2011 1801   O2SAT 99.0 10/11/2011 1106   CBG (last 3)   Basename 10/13/11 0723 10/13/11 0444  10/13/11 0031  GLUCAP 136* 119* 131*   CXR:  Patchy edema seems improved with slightly better aeration. Assessment/Plan: S/P Procedure(s) (LRB): MEDIASTINAL EXPLORATION (N/A) Diuresis Continue foley due to diuresing patient, patient critically ill, patient in ICU and urinary output monitoring Continue dopamine and milrinone. Atrial pace at 90 to maximize cardiac output He needs further diuresis before weaning vent   LOS: 6 days    Kenetha Cozza K 10/13/2011

## 2011-10-14 ENCOUNTER — Inpatient Hospital Stay (HOSPITAL_COMMUNITY): Payer: Medicare Other

## 2011-10-14 LAB — COMPREHENSIVE METABOLIC PANEL
ALT: 262 U/L — ABNORMAL HIGH (ref 0–53)
AST: 53 U/L — ABNORMAL HIGH (ref 0–37)
Albumin: 2.7 g/dL — ABNORMAL LOW (ref 3.5–5.2)
CO2: 27 mEq/L (ref 19–32)
Chloride: 102 mEq/L (ref 96–112)
Creatinine, Ser: 1.17 mg/dL (ref 0.50–1.35)
GFR calc non Af Amer: 67 mL/min — ABNORMAL LOW (ref >90)
Potassium: 3.7 mEq/L (ref 3.5–5.1)
Sodium: 141 mEq/L (ref 135–145)
Total Bilirubin: 0.8 mg/dL (ref 0.3–1.2)

## 2011-10-14 LAB — CBC
MCV: 89.7 fL (ref 78.0–100.0)
Platelets: 73 10*3/uL — ABNORMAL LOW (ref 150–400)
RBC: 3.41 MIL/uL — ABNORMAL LOW (ref 4.22–5.81)
RDW: 15.3 % (ref 11.5–15.5)
WBC: 15.9 10*3/uL — ABNORMAL HIGH (ref 4.0–10.5)

## 2011-10-14 LAB — POCT I-STAT 3, ART BLOOD GAS (G3+)
Acid-Base Excess: 7 mmol/L — ABNORMAL HIGH (ref 0.0–2.0)
Bicarbonate: 30.8 mEq/L — ABNORMAL HIGH (ref 20.0–24.0)
O2 Saturation: 97 %
TCO2: 32 mmol/L (ref 0–100)
pCO2 arterial: 38.8 mmHg (ref 35.0–45.0)
pO2, Arterial: 80 mmHg (ref 80.0–100.0)

## 2011-10-14 LAB — GLUCOSE, CAPILLARY
Glucose-Capillary: 107 mg/dL — ABNORMAL HIGH (ref 70–99)
Glucose-Capillary: 112 mg/dL — ABNORMAL HIGH (ref 70–99)
Glucose-Capillary: 119 mg/dL — ABNORMAL HIGH (ref 70–99)

## 2011-10-14 LAB — PROTIME-INR
INR: 2.06 — ABNORMAL HIGH (ref 0.00–1.49)
Prothrombin Time: 23.6 seconds — ABNORMAL HIGH (ref 11.6–15.2)

## 2011-10-14 MED ORDER — POTASSIUM CHLORIDE 10 MEQ/50ML IV SOLN
INTRAVENOUS | Status: AC
  Filled 2011-10-14: qty 50

## 2011-10-14 MED ORDER — POTASSIUM CHLORIDE 10 MEQ/50ML IV SOLN
10.0000 meq | INTRAVENOUS | Status: AC
  Administered 2011-10-14 (×3): 10 meq via INTRAVENOUS
  Filled 2011-10-14: qty 100

## 2011-10-14 MED ORDER — LABETALOL HCL 5 MG/ML IV SOLN
5.0000 mg | INTRAVENOUS | Status: DC | PRN
  Administered 2011-10-14: 5 mg via INTRAVENOUS
  Filled 2011-10-14 (×2): qty 4

## 2011-10-14 NOTE — Progress Notes (Signed)
RT decreased patients VT from to . RT measured patient at 72". Order written for 8cc = . Patient is returning volumes of 640-680. RT will continue to monitor.

## 2011-10-14 NOTE — Procedures (Signed)
Extubation Procedure Note  Patient Details:   Name: [name] DOB: [birth year] MRN: [id number]   Airway Documentation:     Evaluation  O2 sats: stable throughout Complications: No apparent complications Patient did tolerate procedure well. Bilateral Breath Sounds: Diminished;Rhonchi Suctioning: Oral Yes pt able to speak.   Pt able to follow commands by squeezing hands, wiggling toes, and lifting head for 5+ sec off pillow. HR 90, RR 20 BBS diminished with no stridor noted at this time, Sp02 100% on 5 lpm [place]. Mouth and OETT suctioned prior to tube removal. RT will continue to monitor.   Levada Schilling 10/14/2011, 1:00 PM

## 2011-10-14 NOTE — Progress Notes (Addendum)
TCTS BRIEF SICU PROGRESS NOTE  6 Days Post-Op  S/P Procedure(s) (LRB): MEDIASTINAL EXPLORATION (N/A)   Stable day Extubated uneventfully AAI paced, BP somewhat increased 152/84 Diuresing very well  Plan: Will turn pacer off.  May need beta blocker increased if BP stays elevated.    OWEN,CLARENCE H 10/14/2011 8:05 PM

## 2011-10-14 NOTE — Progress Notes (Signed)
Patient found with post-pyloric feeding tube removed.  Tube feeding was immediately turned off, mouth suctioned.  No tube feeding present in oral cavity.  Patient's lungs suctioned.  No change in color of secretions.  Lung secretions remained white.  Patient not exhibiting any distress.  Patient calm and tolerating ventilator well.  Placement of OG tube re-checked.  Dr. Laneta Simmers notified.  Bilateral wrist restraints applied.

## 2011-10-14 NOTE — Progress Notes (Signed)
Nutrition Follow-up  Intervention:    If extubation unsuccessful, resume previous EN regimen of Osmolite 1.5 formula at 20 ml/hr and increase by 10 ml every 4 hours to goal rate of 50 ml/hr with Prostat liquid protein 3 times daily via tube to provide 2100 total kcals, 120 gm protein, 914 ml of free water RD to follow for nutrition care plan  Assessment:   Patient remains intubated & sedated. Pulled Panda tube out. EN discontinued. Per RN, to wean vent with hopeful extubation today.  Diet Order:  NPO  Meds: Scheduled Meds:   . acetaminophen  1,000 mg Oral Q6H   Or  . acetaminophen (TYLENOL) oral liquid 160 mg/5 mL  975 mg Per Tube Q6H  . antiseptic oral rinse  15 mL Mouth Rinse QID  . bisacodyl  10 mg Oral Daily   Or  . bisacodyl  10 mg Rectal Daily  . chlorhexidine  15 mL Mouth Rinse BID  . docusate sodium  200 mg Oral Daily  . insulin aspart  0-24 Units Subcutaneous Q4H  . pantoprazole sodium  40 mg Per Tube Q1200  . potassium chloride  10 mEq Intravenous Q1 Hr x 3  . potassium chloride  40 mEq Per Tube TID  . sodium chloride  3 mL Intravenous Q12H  . venlafaxine XR  150 mg Oral BID  . DISCONTD: feeding supplement  30 mL Per Tube TID WC   Continuous Infusions:   . sodium chloride 20 mL/hr at 10/13/11 0730  . sodium chloride Stopped (10/08/11 1830)  . sodium chloride 10 mL/hr at 10/07/11 1845  . sodium chloride 250 mL (10/08/11 0502)  . sodium chloride 20 mL/hr at 10/14/11 0758  . sodium chloride 250 mL (10/12/11 2000)  . amiodarone (NEXTERONE PREMIX) 360 mg/200 mL dextrose 0.5 mg/min (10/14/11 0913)  . dexmedetomidine Stopped (10/14/11 1045)  . DOPamine 5 mcg/kg/min (10/14/11 0800)  . furosemide (LASIX) infusion 6 mg/hr (10/14/11 0830)  . milrinone 0.2 mcg/kg/min (10/14/11 0800)  . DISCONTD: feeding supplement (OSMOLITE 1.5 CAL) 1,000 mL (10/13/11 1712)  . DISCONTD: insulin (NOVOLIN-R) infusion Stopped (10/10/11 0305)  . DISCONTD: nitroGLYCERIN Stopped (10/08/11 1830)     PRN Meds:.ALPRAZolam, metoprolol, metoprolol, midazolam, morphine injection, ondansetron (ZOFRAN) IV, oxyCODONE, sodium chloride  Labs:  CMP     Component Value Date/Time   NA 141 10/14/2011 0430   K 3.7 10/14/2011 0430   CL 102 10/14/2011 0430   CO2 27 10/14/2011 0430   GLUCOSE 127* 10/14/2011 0430   BUN 50* 10/14/2011 0430   CREATININE 1.17 10/14/2011 0430   CALCIUM 8.8 10/14/2011 0430   PROT 6.6 10/14/2011 0430   ALBUMIN 2.7* 10/14/2011 0430   AST 53* 10/14/2011 0430   ALT 262* 10/14/2011 0430   ALKPHOS 172* 10/14/2011 0430   BILITOT 0.8 10/14/2011 0430   GFRNONAA 67* 10/14/2011 0430   GFRAA 77* 10/14/2011 0430     Intake/Output Summary (Last 24 hours) at 10/14/11 1102 Last data filed at 10/14/11 1000  Gross per 24 hour  Intake 3417.75 ml  Output   8410 ml  Net -4992.25 ml    CBG (last 3)   Basename 10/14/11 0727 10/14/11 0418 10/13/11 2335  GLUCAP 107* 112* 119*    Weight Status:  99.1 kg (7/15) -- trending down with diuresis   Re-estimated needs:  2000-2100 kcals, 120-130 gm protein  Nutrition Dx:  Inadequate Oral Intake, ongoing  Goal:  EN vs PO diet to meet >/= 90% of estimated nutrition needs, currently unmet  Monitor:  EN re-initiation vs PO diet advancement, respiratory status, weight, labs, I/O's   Alger Memos, RD, LDN Pager #: [phone] After-Hours Pager #: [phone]

## 2011-10-14 NOTE — Progress Notes (Signed)
6 Days Post-Op Procedure(s) (LRB): MEDIASTINAL EXPLORATION (N/A) Subjective: Sedated on vent but responds appropriately  Objective: Vital signs in last 24 hours: Temp:  [97.2 F (36.2 C)-98.1 F (36.7 C)] 97.9 F (36.6 C) (07/15 0725) Pulse Rate:  [67-93] 74  (07/15 0700) Cardiac Rhythm:  [-] Atrial paced (07/15 0200) Resp:  [12-21] 12  (07/15 0700) BP: (85-137)/(50-88) 119/78 mmHg (07/15 0700) SpO2:  [96 %-100 %] 97 % (07/15 0700) FiO2 (%):  [40 %] 40 % (07/15 0600) Weight:  [99.1 kg (218 lb 7.6 oz)] 99.1 kg (218 lb 7.6 oz) (07/15 0500)  Hemodynamic parameters for last 24 hours: PAP: (13-23)/(6-17) 21/10 mmHg CO:  [4.5 L/min-4.9 L/min] 4.5 L/min CI:  [2.2 L/min/m2-2.3 L/min/m2] 2.2 L/min/m2  Intake/Output from previous day: 07/14 0701 - 07/15 0700 In: 3566 [I.V.:2326; NG/GT:1090; IV Piggyback:150] Out: 8700 [Urine:8700] Intake/Output this shift:    General appearance: sedated but responds and follows commands Neurologic: grossly intact Heart: regular rate and rhythm and valve sounds ok Lungs: clear to auscultation bilaterally Abdomen: soft and nontender, bowel sounds present Extremities: edema moderate Wound: dressing dry  Lab Results:  Basename 10/14/11 0430 10/13/11 0443  WBC 15.9* 12.5*  HGB 10.3* 9.6*  HCT 30.6* 28.8*  PLT 73* 51*   BMET:  Basename 10/14/11 0430 10/13/11 0443  NA 141 141  K 3.7 3.4*  CL 102 103  CO2 27 25  GLUCOSE 127* 124*  BUN 50* 53*  CREATININE 1.17 1.54*  CALCIUM 8.8 8.8    PT/INR:  Basename 10/14/11 0430  LABPROT 23.6*  INR 2.06*   ABG    Component Value Date/Time   PHART 7.477* 10/13/2011 1046   HCO3 27.2* 10/13/2011 1046   TCO2 28 10/13/2011 1046   ACIDBASEDEF 9.0* 10/07/2011 1801   O2SAT 96.0 10/13/2011 1046   CBG (last 3)   Basename 10/14/11 0727 10/14/11 0418 10/13/11 2335  GLUCAP 107* 112* 119*   CXR:  Pulmonary edema continues to resolve and aeration improved   Assessment/Plan: S/P Procedure(s)  (LRB): MEDIASTINAL EXPLORATION (N/A) Replacement of ascending aorta Volume overload and pulmonary edema continue to improve with diuresis. Probably need to slow diuresis to avoid hypotension.  Continue dopamine and wean off milrinone  Decrease sedation and try to wean vent  He pulled Panda out overnight.  Will leave out for now and stop tube feeds while trying to wean vent.    LOS: 7 days    BARTLE,BRYAN K 10/14/2011

## 2011-10-15 ENCOUNTER — Encounter (HOSPITAL_COMMUNITY): Payer: Self-pay

## 2011-10-15 ENCOUNTER — Inpatient Hospital Stay (HOSPITAL_COMMUNITY): Payer: Medicare Other

## 2011-10-15 LAB — GLUCOSE, CAPILLARY
Glucose-Capillary: 100 mg/dL — ABNORMAL HIGH (ref 70–99)
Glucose-Capillary: 123 mg/dL — ABNORMAL HIGH (ref 70–99)
Glucose-Capillary: 129 mg/dL — ABNORMAL HIGH (ref 70–99)
Glucose-Capillary: 82 mg/dL (ref 70–99)
Glucose-Capillary: 97 mg/dL (ref 70–99)

## 2011-10-15 LAB — CBC
HCT: 36.3 % — ABNORMAL LOW (ref 39.0–52.0)
Hemoglobin: 11.9 g/dL — ABNORMAL LOW (ref 13.0–17.0)
MCH: 29.4 pg (ref 26.0–34.0)
MCHC: 32.8 g/dL (ref 30.0–36.0)
MCV: 89.6 fL (ref 78.0–100.0)
RBC: 4.05 MIL/uL — ABNORMAL LOW (ref 4.22–5.81)

## 2011-10-15 LAB — HEPARIN INDUCED THROMBOCYTOPENIA PNL
Heparin Induced Plt Ab: NEGATIVE
UFH Low Dose 0.1 IU/mL: 0 % Release
UFH SRA Result: NEGATIVE

## 2011-10-15 LAB — BASIC METABOLIC PANEL
BUN: 35 mg/dL — ABNORMAL HIGH (ref 6–23)
CO2: 30 mEq/L (ref 19–32)
Calcium: 9.4 mg/dL (ref 8.4–10.5)
GFR calc non Af Amer: >90 mL/min (ref >90)
Glucose, Bld: 113 mg/dL — ABNORMAL HIGH (ref 70–99)

## 2011-10-15 MED ORDER — POTASSIUM CHLORIDE 10 MEQ/50ML IV SOLN
10.0000 meq | INTRAVENOUS | Status: AC
  Administered 2011-10-15 (×2): 10 meq via INTRAVENOUS
  Filled 2011-10-15: qty 100

## 2011-10-15 MED ORDER — WARFARIN SODIUM 2.5 MG PO TABS
2.5000 mg | ORAL_TABLET | Freq: Every day | ORAL | Status: DC
  Administered 2011-10-15: 2.5 mg via ORAL
  Filled 2011-10-15 (×2): qty 1

## 2011-10-15 MED ORDER — SODIUM CHLORIDE 0.9 % IJ SOLN
10.0000 mL | Freq: Two times a day (BID) | INTRAMUSCULAR | Status: DC
  Administered 2011-10-15 – 2011-10-16 (×3): 10 mL
  Administered 2011-10-17: 3 mL
  Administered 2011-10-17 – 2011-10-20 (×6): 10 mL

## 2011-10-15 MED ORDER — WARFARIN - PHYSICIAN DOSING INPATIENT
Freq: Every day | Status: DC
  Administered 2011-10-16 – 2011-10-22 (×3)

## 2011-10-15 MED ORDER — POTASSIUM CHLORIDE 10 MEQ/50ML IV SOLN
INTRAVENOUS | Status: AC
  Administered 2011-10-15: 10 meq via INTRAVENOUS
  Filled 2011-10-15: qty 50

## 2011-10-15 MED ORDER — POTASSIUM CHLORIDE 10 MEQ/50ML IV SOLN
10.0000 meq | INTRAVENOUS | Status: AC | PRN
  Administered 2011-10-15 (×3): 10 meq via INTRAVENOUS
  Filled 2011-10-15: qty 100

## 2011-10-15 MED ORDER — SODIUM CHLORIDE 0.9 % IJ SOLN: 10.0000 mL | INTRAMUSCULAR | Status: DC | PRN

## 2011-10-15 MED ORDER — WARFARIN SODIUM 5 MG PO TABS
5.0000 mg | ORAL_TABLET | Freq: Once | ORAL | Status: DC
  Filled 2011-10-15: qty 1

## 2011-10-15 NOTE — Progress Notes (Signed)
Patient ID: [name], male   DOB: 1950-05-15, 61 y.o.   MRN: [id number]                   [address]            [name] [address]          [phone]     7 Days Post-Op Procedure(s) (LRB): MEDIASTINAL EXPLORATION (N/A)  Total Length of Stay:  LOS: 8 days  BP 149/72  Pulse 78  Temp 97.3 F (36.3 C) (Oral)  Resp 25  Ht 6' (1.829 m)  Wt 195 lb 5.2 oz (88.6 kg)  BMI 26.49 kg/m2  SpO2 96%     . sodium chloride 20 mL/hr at 10/14/11 1522  . sodium chloride 250 mL (10/08/11 0502)  . sodium chloride 20 mL/hr at 10/14/11 0758  . sodium chloride 250 mL (10/12/11 2000)  . amiodarone (NEXTERONE PREMIX) 360 mg/200 mL dextrose 0.5 mg/min (10/15/11 1000)  . dexmedetomidine Stopped (10/14/11 1045)  . DOPamine Stopped (10/15/11 1000)  . DISCONTD: sodium chloride Stopped (10/08/11 1830)  . DISCONTD: sodium chloride 10 mL/hr at 10/07/11 1845  . DISCONTD: furosemide (LASIX) infusion 6 mg/hr (10/14/11 2014)  . DISCONTD: milrinone Stopped (10/14/11 1400)     Lab Results  Component Value Date   WBC 20.2* 10/15/2011   HGB 11.9* 10/15/2011   HCT 36.3* 10/15/2011   PLT 123* 10/15/2011   GLUCOSE 113* 10/15/2011   ALT 262* 10/14/2011   AST 53* 10/14/2011   NA 143 10/15/2011   K 3.1* 10/15/2011   CL 100 10/15/2011   CREATININE 0.85 10/15/2011   BUN 35* 10/15/2011   CO2 30 10/15/2011   INR 1.67* 10/15/2011   HGBA1C 6.1* 08/13/2011   Stable day pic line placed Now on diet  [name] MD  Beeper [phone] Office [phone] 10/15/2011 5:49 PM

## 2011-10-15 NOTE — Progress Notes (Signed)
Peripherally Inserted Central Catheter/Midline Placement  The IV Nurse has discussed with the patient and/or persons authorized to consent for the patient, the purpose of this procedure and the potential benefits and risks involved with this procedure.  The benefits include less needle sticks, lab draws from the catheter and patient may be discharged home with the catheter.  Risks include, but not limited to, infection, bleeding, blood clot (thrombus formation), and puncture of an artery; nerve damage and irregular heat beat.  Alternatives to this procedure were also discussed.  PICC/Midline Placement Documentation        [name] 10/15/2011, 11:33 AM

## 2011-10-15 NOTE — Consult Note (Deleted)
ANTICOAGULATION CONSULT NOTE - Initial Consult  Pharmacy Consult for Coumadin Indication: Mechanical AVR  Allergies: No Known Allergies  Height/Weight: Height: 6' (182.9 cm) Weight: 195 lb 5.2 oz (88.6 kg) IBW/kg (Calculated) : 77.6   Vital Signs: Blood pressure 139/75, pulse 79, temperature 98.1 F (36.7 C), temperature source Core (Comment), resp. rate 23, height 6' (1.829 m), weight 195 lb 5.2 oz (88.6 kg), SpO2 95.00%.  Labs:  [facility] 10/15/11 0416 10/14/11 0430 10/13/11 0443  HGB 11.9* 10.3* 9.6*  HCT 36.3* 30.6* 28.8*  PLT 123* 73* 51*  APTT -- -- --  LABPROT 20.0* 23.6* 29.9*  INR 1.67* 2.06* 2.79*  HEPARINUNFRC -- -- --  CREATININE 0.85 1.17 1.54*  CKTOTAL -- -- --  CKMB -- -- --  TROPONINI -- -- --   No results found for this basename: [facility]   Lab Results  Component Value Date   INR 1.67* 10/15/2011   INR 2.06* 10/14/2011   INR 2.79* 10/13/2011   Estimated Creatinine Clearance: 102.7 ml/min (by C-G formula based on Cr of 0.85).  Medical / Surgical History: Past Medical History  Diagnosis Date  . Murmur     Trace mitral regurgitation by echocardiogram February 2012  . Hyperlipidemia     takes Simvastatin nightly  . Mitral regurgitation     New murmur at the apex consistent with mitral regurgitation  . Aortic stenosis     Bicuspid aortic valve with mild-to-moderate aortic stenosis echocardiogram February 2012 mean gradient 34 mmHg, aortic valve area 0.7 cm, peak velocity 2.43 m/scomment by catheterization mean gradient 18 mmHg December 2010 status post TEE  . CAD (coronary artery disease)     native vessel, nonobstructive, by catheterization, last catheterization December 2010  . Dyslipidemia   . Fibromyalgia   . Peripheral vascular disease   . Myocardial infarction 10 yrs ago  . Shortness of breath     sitting/lying/exertion  . Bronchitis     hx of-52yrs ago  . Ringing in ears     since seizure 65yrs ago-he hit his head  . Seizures     6  yrs ago from withdrawal of xanax to quickly.  . Dizziness   . Confusion   . Short-term memory loss   . Long-term memory loss   . Stroke     behind right eye-occ sees double 12 yrs ago  . Arthritis   . Joint pain   . Pain and swelling of forearm   . Chronic back pain     lumbar spondylosis  . Dry skin   . GERD (gastroesophageal reflux disease)     takes Protonix daily  . Constipation     takes Miralax prn  . Hx of colonic polyps   . Slow urinary stream   . Nocturia   . Burning with urination   . Major depressive disorder     wih prior suicidal attempts;takes Effexor bid  . Anxiety     takes Xanax 5 times day   Past Surgical History  Procedure Date  . Inguinal exploration     right  . Femoral hernia repair     x2  . Umbilical hernia repair     [facility]  . Finger contracture release     Tendon release operation on his right little finger  . Appendectomy   . Cholecystectomy     [facility]  . Incisional hernia repair 05/01/2011    Procedure: HERNIA REPAIR INCISIONAL;  Surgeon: Dalia Heading, MD;  Location: [facility];  Service: General;  Laterality: N/A;  with Mesh  . Tonsillectomy     at age 14  . Circumcision   . Cardiac catheterization   . Colonoscopy   . Esophagogastroduodenoscopy 04/07/08/13  . Aortic valve replacement 08/15/2011    Procedure: AORTIC VALVE REPLACEMENT (AVR);  Surgeon: Loreli Slot, MD;  Location: [facility] OR;  Service: Open Heart Surgery;  Laterality: N/A;  . Sternotomy 10/07/2011    Procedure: STERNOTOMY;  Surgeon: Loreli Slot, MD;  Location: [facility] OR;  Service: Open Heart Surgery;  Laterality: N/A;  . Thoracic aortic aneurysm repair 10/07/2011    Procedure: THORACIC ASCENDING ANEURYSM REPAIR (AAA);  Surgeon: Loreli Slot, MD;  Location: [facility] OR;  Service: Open Heart Surgery;  Laterality: N/A;  . Mediastinal exploration 10/08/2011    Procedure: MEDIASTINAL EXPLORATION;  Surgeon: Loreli Slot, MD;  Location: [facility] OR;  Service: Open Heart Surgery;   Laterality: N/A;    Medications:  Prescriptions prior to admission  Medication Sig Dispense Refill  . ALPRAZolam (XANAX) 1 MG tablet Take 1 mg by mouth 3 (three) times daily as needed.       [name] amoxicillin-clavulanate (AUGMENTIN) 875-125 MG per tablet Take 1 tablet by mouth 2 (two) times daily.      [name] aspirin EC 81 MG tablet Take 81 mg by mouth daily.      [name] HYDROcodone-acetaminophen (NORCO) 5-325 MG per tablet Take 1 tablet by mouth Three times a day.      . metoprolol tartrate (LOPRESSOR) 12.5 mg TABS Take 12.5 mg by mouth 2 (two) times daily.      . pantoprazole (PROTONIX) 40 MG tablet Take 40 mg by mouth daily.        . promethazine (PHENERGAN) 25 MG tablet Take 25 mg by mouth every 6 (six) hours as needed. For nausea      . simvastatin (ZOCOR) 40 MG tablet Take 40 mg by mouth at bedtime.        [name] venlafaxine (EFFEXOR-XR) 150 MG 24 hr capsule Take 150 mg by mouth 2 (two) times daily.       [name] warfarin (COUMADIN) 2 MG tablet Take 2-3 mg by mouth daily. Take 2 MG every day except for Mondays; on Mondays, take 3 MG       Scheduled:    . antiseptic oral rinse  15 mL Mouth Rinse QID  . bisacodyl  10 mg Oral Daily   Or  . bisacodyl  10 mg Rectal Daily  . chlorhexidine  15 mL Mouth Rinse BID  . docusate sodium  200 mg Oral Daily  . insulin aspart  0-24 Units Subcutaneous Q4H  . pantoprazole sodium  40 mg Per Tube Q1200  . potassium chloride  10 mEq Intravenous Q1 Hr x 3  . potassium chloride  10 mEq Intravenous Q1 Hr x 2  . sodium chloride  3 mL Intravenous Q12H  . venlafaxine XR  150 mg Oral BID  . warfarin  2.5 mg Oral q1800  . Warfarin - Physician Dosing Inpatient   Does not apply q1800  . DISCONTD: potassium chloride  40 mEq Per Tube TID   Infusions:    . sodium chloride 20 mL/hr at 10/14/11 1522  . sodium chloride 250 mL (10/08/11 0502)  . sodium chloride 20 mL/hr at 10/14/11 0758  . sodium chloride 250 mL (10/12/11 2000)  . amiodarone (NEXTERONE PREMIX) 360 mg/200 mL  dextrose 0.5 mg/min (10/14/11 2247)  . dexmedetomidine Stopped (10/14/11 1045)  . DOPamine 5  mcg/kg/min (10/14/11 2000)  . DISCONTD: sodium chloride Stopped (10/08/11 1830)  . DISCONTD: sodium chloride 10 mL/hr at 10/07/11 1845  . DISCONTD: furosemide (LASIX) infusion 6 mg/hr (10/14/11 2014)  . DISCONTD: milrinone Stopped (10/14/11 1400)    Assessment:  60 y.o.male admitted for repair of a ruptured aortic pseudoaneurysm.  S/p mechanical AVR 5/13 for which patient was on chronic Coumadin PTA.  Patient extubated, ARF resolved with fluid overload correction.  Goal of Therapy:   Target INR of 2.5-3.5   Plan:   Resume Coumadin today.  Will give 5 mg x 1 today.  Daily INR's, CBC.   Holle Sprick, Elisha Headland, Pharm.D. 10/15/2011 8:40 AM

## 2011-10-15 NOTE — Progress Notes (Signed)
7 Days Post-Op Procedure(s) (LRB): MEDIASTINAL EXPLORATION (N/A) Subjective: No complaints this AM Denies pain and nausea  Objective: Vital signs in last 24 hours: Temp:  [97.7 F (36.5 C)-99.3 F (37.4 C)] 97.9 F (36.6 C) (07/16 0737) Pulse Rate:  [73-90] 79  (07/16 0700) Cardiac Rhythm:  [-] Normal sinus rhythm (07/16 0600) Resp:  [14-27] 23  (07/16 0700) BP: (90-152)/(60-85) 129/81 mmHg (07/16 0700) SpO2:  [91 %-100 %] 95 % (07/16 0700) FiO2 (%):  [40 %] 40 % (07/15 1200) Weight:  [195 lb 5.2 oz (88.6 kg)] 195 lb 5.2 oz (88.6 kg) (07/16 0500)  Hemodynamic parameters for last 24 hours: PAP: (16-35)/(6-22) 22/11 mmHg CO:  [3.9 L/min-6.1 L/min] 3.9 L/min CI:  [1.9 L/min/m2-2.9 L/min/m2] 1.9 L/min/m2  Intake/Output from previous day: 07/15 0701 - 07/16 0700 In: 2076.3 [I.V.:1844.3; NG/GT:30; IV Piggyback:202] Out: 8435 [Urine:8435] Intake/Output this shift:    General appearance: cooperative and no distress Neurologic: some psychomotor slowness, no focal deficit Heart: regular rate and rhythm Lungs: diminished breath sounds bibasilar Abdomen: normal findings: soft, non-tender Wound: clean, dry and intact  Lab Results:  Basename 10/15/11 0416 10/14/11 0430  WBC 20.2* 15.9*  HGB 11.9* 10.3*  HCT 36.3* 30.6*  PLT 123* 73*   BMET:  Basename 10/15/11 0416 10/14/11 0430  NA 143 141  K 3.1* 3.7  CL 100 102  CO2 30 27  GLUCOSE 113* 127*  BUN 35* 50*  CREATININE 0.85 1.17  CALCIUM 9.4 8.8    PT/INR:  Basename 10/15/11 0416  LABPROT 20.0*  INR 1.67*   ABG    Component Value Date/Time   PHART 7.507* 10/14/2011 1233   HCO3 30.8* 10/14/2011 1233   TCO2 32 10/14/2011 1233   ACIDBASEDEF 9.0* 10/07/2011 1801   O2SAT 97.0 10/14/2011 1233   CBG (last 3)   Basename 10/15/11 0343 10/14/11 2344 10/14/11 1929  GLUCAP 104* 123* 116*    Assessment/Plan: S/P Procedure(s) (LRB): MEDIASTINAL EXPLORATION (N/A) POD # 7 Repair ascending aortic pseudoaneurysm CV- stable,  maintaining SR AVR- mechanical- restart coumadin RESP- VDRF resolved, extubated yesterday, pulmonary toilet RENAL- ARF secondary to ATN- resolved, 10 liters negative over past 2 days, d/c lasix, wean dopamine NEURO- some mild psychomotor slowness- expected NUTRITION- question of aspiration, will get bedside swallow today   LOS: 8 days    HENDRICKSON,STEVEN C 10/15/2011

## 2011-10-15 NOTE — Evaluation (Signed)
Clinical/Bedside Swallow Evaluation Patient Details  Name: [name] MRN: [id number] Date of Birth: 01-14-1951  Today's Date: 10/15/2011 Time: 1410-1433 SLP Time Calculation (min): 23 min  Past Medical History:  Past Medical History  Diagnosis Date  . Murmur     Trace mitral regurgitation by echocardiogram February 2012  . Hyperlipidemia     takes Simvastatin nightly  . Mitral regurgitation     New murmur at the apex consistent with mitral regurgitation  . Aortic stenosis     Bicuspid aortic valve with mild-to-moderate aortic stenosis echocardiogram February 2012 mean gradient 34 mmHg, aortic valve area 0.7 cm, peak velocity 2.43 m/scomment by catheterization mean gradient 18 mmHg December 2010 status post TEE  . CAD (coronary artery disease)     native vessel, nonobstructive, by catheterization, last catheterization December 2010  . Dyslipidemia   . Fibromyalgia   . Peripheral vascular disease   . Myocardial infarction 10 yrs ago  . Shortness of breath     sitting/lying/exertion  . Bronchitis     hx of-79yrs ago  . Ringing in ears     since seizure 65yrs ago-he hit his head  . Seizures     6 yrs ago from withdrawal of xanax to quickly.  . Dizziness   . Confusion   . Short-term memory loss   . Long-term memory loss   . Stroke     behind right eye-occ sees double 12 yrs ago  . Arthritis   . Joint pain   . Pain and swelling of forearm   . Chronic back pain     lumbar spondylosis  . Dry skin   . GERD (gastroesophageal reflux disease)     takes Protonix daily  . Constipation     takes Miralax prn  . Hx of colonic polyps   . Slow urinary stream   . Nocturia   . Burning with urination   . Major depressive disorder     wih prior suicidal attempts;takes Effexor bid  . Anxiety     takes Xanax 5 times day   Past Surgical History:  Past Surgical History  Procedure Date  . Inguinal exploration     right  . Femoral hernia repair     x2  . Umbilical hernia repair     [facility]  . Finger contracture release     Tendon release operation on his right little finger  . Appendectomy   . Cholecystectomy     [facility]  . Incisional hernia repair 05/01/2011    Procedure: HERNIA REPAIR INCISIONAL;  Surgeon: Dalia Heading, MD;  Location: [facility];  Service: General;  Laterality: N/A;  with Mesh  . Tonsillectomy     at age 46  . Circumcision   . Cardiac catheterization   . Colonoscopy   . Esophagogastroduodenoscopy 04/07/08/13  . Aortic valve replacement 08/15/2011    Procedure: AORTIC VALVE REPLACEMENT (AVR);  Surgeon: Loreli Slot, MD;  Location: [facility] OR;  Service: Open Heart Surgery;  Laterality: N/A;  . Sternotomy 10/07/2011    Procedure: STERNOTOMY;  Surgeon: Loreli Slot, MD;  Location: [facility] OR;  Service: Open Heart Surgery;  Laterality: N/A;  . Thoracic aortic aneurysm repair 10/07/2011    Procedure: THORACIC ASCENDING ANEURYSM REPAIR (AAA);  Surgeon: Loreli Slot, MD;  Location: [facility] OR;  Service: Open Heart Surgery;  Laterality: N/A;  . Mediastinal exploration 10/08/2011    Procedure: MEDIASTINAL EXPLORATION;  Surgeon: Loreli Slot, MD;  Location: [facility] OR;  Service:  Open Heart Surgery;  Laterality: N/A;   HPI:  61 yo Wm s/p AVR in May. Presented to [facility] ED with c/o syncope. He had been feeling poorly for about 4 days PTA. He says he felt very tired, weak and ill for about 4 days. The morning of admission he passed out. At [facility] ED w/u included CT chest to r/o PE and he was found to a contained rupture of an ascending aortic pseudoaneurysm. He was hypotensive and in shock. He was transported emergently to the OR at [facility] by EMS.  S/p repair of ruptured ascending aortic pseudoanyeurysm on 7/9.  RN reported some coughing with liquids after extubation on 7/15, warranting a bedside swallow evaluation order.   Assessment / Plan / Recommendation Clinical Impression  Demonstrates an overall functional oral-pharyngeal swallow with no overt clinical  indicators of aspiration risk within this assessment.  Patient's dentures not available during the assessment and he reports that he is unable to masticate without them.  Spoke with his sister on the telephone who plans to bring them this evening after 5pm.  No further skilled SLP services at this time.    Aspiration Risk  None    Diet Recommendation Regular;Thin liquid   Liquid Administration via: Cup;Straw Medication Administration: Whole meds with liquid Supervision: Patient able to self feed;Full supervision/cueing for compensatory strategies;Staff feed patient Compensations: Slow rate;Small sips/bites Postural Changes and/or Swallow Maneuvers: Seated upright 90 degrees;Upright 30-60 min after meal    Follow Up Recommendations  None    Pertinent Vitals/Pain n/a    Swallow Study Oral/Motor/Sensory Function Overall Oral Motor/Sensory Function: Appears within functional limits for tasks assessed   Ice Chips Ice chips: Within functional limits   Thin Liquid Thin Liquid: Within functional limits Presentation: Cup;Straw    Nectar Thick Nectar Thick Liquid: Not tested   Honey Thick Honey Thick Liquid: Not tested   Puree Puree: Within functional limits Presentation: Spoon;Self Fed   Solid Solid: Not tested (Patient unable to chew without dentures)    Myra Rude, M.S.,CCC-SLP Pager [phone] 10/15/2011,2:41 PM

## 2011-10-15 NOTE — Progress Notes (Signed)
Clinical Social Work-CSW left message for pt sister to discuss possible d/c planning. CSW initiated FL2 and per CM family is agreeable to ST SNF placement. CSW will continue to follow and facilitate d/c planning- Jodean Lima, [phone]

## 2011-10-16 ENCOUNTER — Inpatient Hospital Stay (HOSPITAL_COMMUNITY): Payer: Medicare Other

## 2011-10-16 LAB — COMPREHENSIVE METABOLIC PANEL
BUN: 30 mg/dL — ABNORMAL HIGH (ref 6–23)
Calcium: 8.8 mg/dL (ref 8.4–10.5)
Creatinine, Ser: 0.83 mg/dL (ref 0.50–1.35)
GFR calc Af Amer: >90 mL/min (ref >90)
GFR calc non Af Amer: >90 mL/min (ref >90)
Glucose, Bld: 115 mg/dL — ABNORMAL HIGH (ref 70–99)
Total Protein: 6.8 g/dL (ref 6.0–8.3)

## 2011-10-16 LAB — GLUCOSE, CAPILLARY: Glucose-Capillary: 142 mg/dL — ABNORMAL HIGH (ref 70–99)

## 2011-10-16 LAB — PROTIME-INR: Prothrombin Time: 21.7 seconds — ABNORMAL HIGH (ref 11.6–15.2)

## 2011-10-16 LAB — CBC
HCT: 34.5 % — ABNORMAL LOW (ref 39.0–52.0)
Hemoglobin: 11.1 g/dL — ABNORMAL LOW (ref 13.0–17.0)
MCH: 29.2 pg (ref 26.0–34.0)
MCHC: 32.2 g/dL (ref 30.0–36.0)
MCV: 90.8 fL (ref 78.0–100.0)

## 2011-10-16 MED ORDER — ENSURE COMPLETE PO LIQD
237.0000 mL | Freq: Two times a day (BID) | ORAL | Status: DC
  Administered 2011-10-19 – 2011-10-23 (×7): 237 mL via ORAL

## 2011-10-16 MED ORDER — POTASSIUM CHLORIDE CRYS ER 20 MEQ PO TBCR
40.0000 meq | EXTENDED_RELEASE_TABLET | Freq: Three times a day (TID) | ORAL | Status: AC
  Administered 2011-10-16 (×3): 40 meq via ORAL
  Filled 2011-10-16 (×3): qty 2

## 2011-10-16 MED ORDER — PRO-STAT SUGAR FREE PO LIQD
30.0000 mL | Freq: Two times a day (BID) | ORAL | Status: DC
  Administered 2011-10-16 – 2011-10-23 (×6): 30 mL via ORAL
  Filled 2011-10-16 (×16): qty 30

## 2011-10-16 MED ORDER — AMIODARONE HCL 200 MG PO TABS
400.0000 mg | ORAL_TABLET | Freq: Two times a day (BID) | ORAL | Status: DC
  Administered 2011-10-16 (×2): 400 mg via ORAL
  Filled 2011-10-16 (×4): qty 2

## 2011-10-16 MED ORDER — POTASSIUM CHLORIDE 10 MEQ/50ML IV SOLN
10.0000 meq | INTRAVENOUS | Status: AC | PRN
  Administered 2011-10-16 (×3): 10 meq via INTRAVENOUS

## 2011-10-16 MED ORDER — METOPROLOL TARTRATE 12.5 MG HALF TABLET
12.5000 mg | ORAL_TABLET | Freq: Two times a day (BID) | ORAL | Status: DC
  Administered 2011-10-16 (×2): 12.5 mg via ORAL
  Filled 2011-10-16 (×4): qty 1

## 2011-10-16 MED ORDER — POTASSIUM CHLORIDE 10 MEQ/50ML IV SOLN
INTRAVENOUS | Status: AC
  Administered 2011-10-16: 10 meq via INTRAVENOUS
  Filled 2011-10-16: qty 150

## 2011-10-16 MED ORDER — WARFARIN SODIUM 5 MG PO TABS
5.0000 mg | ORAL_TABLET | Freq: Every day | ORAL | Status: DC
  Administered 2011-10-16: 5 mg via ORAL
  Filled 2011-10-16 (×2): qty 1

## 2011-10-16 NOTE — Progress Notes (Signed)
Clinical Social Work Department BRIEF PSYCHOSOCIAL ASSESSMENT 10/16/2011  Patient:  [name], [name]     Account Number:  [id number]     Admit date:  10/07/2011  Clinical Social Worker:  Conley Simmonds  Date/Time:  10/16/2011 10:15 AM  Referred by:  Care Management  Date Referred:  10/08/2011 Referred for  SNF Placement   Other Referral:   Interview type:  Patient Other interview type:    PSYCHOSOCIAL DATA Living Status:  ALONE Admitted from facility:   Level of care:   Primary support name:  [facility] Primary support relationship to patient:  SIBLING Degree of support available:    CURRENT CONCERNS  Other Concerns:    SOCIAL WORK ASSESSMENT / PLAN CSW met with pt at bedside. Pt alert and oriented x3-eating breakfast. CSW discussed preparing for possible d/c options once pt is medically stable. Pt reports that he lives alone and has had HH but has never been to SNF-  CSW discussed ST rehab/placement purpose and process and pt is agreeable to SNF-  CSW will initiate bed search and provide offers to pt   Assessment/plan status:   Other assessment/ plan:   Information/referral to community resources:   ST SNF    PATIENT'S/FAMILY'S RESPONSE TO PLAN OF CARE: Pt in good spirits and enjoying breakfast. Pt displayed an understanding of the need for SNF placement and will provide pt sister with CSW contact information in the event that family has any questions or needs should they arise.  Jodean Lima, [phone]

## 2011-10-16 NOTE — Progress Notes (Signed)
Nutrition Follow-up  Intervention:    Strawberry Ensure Complete twice daily (350 kcals, 13 gm protein per 8 fl oz bottle)  Prostat liquid protein 30 ml PO twice daily (100 kcals, 15 gm protein dose) RD to follow for nutrition care plan  Assessment:   Patient extubated 7/15. S/p bedside swallow evaluation 7/16 -- demonstrated overall function oral-pharyngeal swallow. PO intake variable; per RN fair intake with breakfast this AM. Patient amenable to Ensure Complete supplements -- RD to order.  Diet Order:  Carbohydrate Modified Medium Calorie  Meds: Scheduled Meds:   . amiodarone  400 mg Oral BID  . bisacodyl  10 mg Oral Daily   Or  . bisacodyl  10 mg Rectal Daily  . docusate sodium  200 mg Oral Daily  . insulin aspart  0-24 Units Subcutaneous Q4H  . metoprolol tartrate  12.5 mg Oral BID  . pantoprazole sodium  40 mg Per Tube Q1200  . potassium chloride  40 mEq Oral TID  . sodium chloride  10-40 mL Intracatheter Q12H  . sodium chloride  3 mL Intravenous Q12H  . venlafaxine XR  150 mg Oral BID  . warfarin  5 mg Oral q1800  . Warfarin - Physician Dosing Inpatient   Does not apply q1800  . DISCONTD: antiseptic oral rinse  15 mL Mouth Rinse QID  . DISCONTD: chlorhexidine  15 mL Mouth Rinse BID  . DISCONTD: warfarin  2.5 mg Oral q1800   Continuous Infusions:   . sodium chloride 20 mL/hr at 10/14/11 1522  . sodium chloride 250 mL (10/08/11 0502)  . sodium chloride 20 mL/hr at 10/14/11 0758  . sodium chloride 250 mL (10/12/11 2000)  . dexmedetomidine Stopped (10/14/11 1045)  . DOPamine Stopped (10/15/11 1000)  . DISCONTD: amiodarone (NEXTERONE PREMIX) 360 mg/200 mL dextrose 0.5 mg/min (10/16/11 0813)   PRN Meds:.ALPRAZolam, labetalol, metoprolol, metoprolol, morphine injection, ondansetron (ZOFRAN) IV, oxyCODONE, potassium chloride, sodium chloride, sodium chloride  Labs:  CMP     Component Value Date/Time   NA 144 10/16/2011 0410   K 2.7* 10/16/2011 0410   CL 102 10/16/2011  0410   CO2 30 10/16/2011 0410   GLUCOSE 115* 10/16/2011 0410   BUN 30* 10/16/2011 0410   CREATININE 0.83 10/16/2011 0410   CALCIUM 8.8 10/16/2011 0410   PROT 6.8 10/16/2011 0410   ALBUMIN 3.1* 10/16/2011 0410   AST 61* 10/16/2011 0410   ALT 185* 10/16/2011 0410   ALKPHOS 156* 10/16/2011 0410   BILITOT 1.2 10/16/2011 0410   GFRNONAA >90 10/16/2011 0410   GFRAA >90 10/16/2011 0410     Intake/Output Summary (Last 24 hours) at 10/16/11 1103 Last data filed at 10/16/11 1000  Gross per 24 hour  Intake  828.1 ml  Output   2215 ml  Net -1386.9 ml    CBG (last 3)   Basename 10/16/11 0751 10/16/11 0404 10/15/11 2336  GLUCAP 105* 111* 97    Weight Status:  86.1 kg (7/17) -- down due to fluid loss  Re-estimated needs:  2100-2300 kcals, 120-130 gm protein  Nutrition Dx:  Inadequate Oral Intake now r/t limited appetite as evidenced by RN report, ongoing  New Goal:  Oral intake with meals & supplements to meet >/= 90% of estimated nutrition needs, currently unmet  Monitor:  PO intake, supplement acceptance, weight, labs, I/O's  Kirkland Hun, RD, LDN Pager #: [phone] After-Hours Pager #: [phone]

## 2011-10-16 NOTE — Progress Notes (Addendum)
[address]            [place] [address]          [phone]     8 Days Post-Op Procedure(s) (LRB): MEDIASTINAL EXPLORATION (N/A)  Subjective: OOB to chair.  No complaints this am.  Tolerating diet, denies dyphagia.  Objective: Vital signs in last 24 hours: Patient Vitals for the past 24 hrs:  BP Temp Temp src Pulse Resp SpO2 Weight  10/16/11 0700 138/71 mmHg - - 76  21  97 % -  10/16/11 0630 - - - - 22  98 % 189 lb 13.1 oz (86.1 kg)  10/16/11 0600 132/76 mmHg - - 73  25  98 % -  10/16/11 0530 139/70 mmHg - - 74  20  100 % -  10/16/11 0500 136/70 mmHg - - 73  22  95 % -  10/16/11 0430 131/72 mmHg - - 73  21  97 % -  10/16/11 0400 131/69 mmHg 97.4 F (36.3 C) Oral 75  22  97 % -  10/16/11 0330 132/71 mmHg - - 78  24  97 % -  10/16/11 0300 132/89 mmHg - - 77  24  97 % -  10/16/11 0240 - - - 78  25  98 % -  10/16/11 0230 138/76 mmHg - - 79  24  95 % -  10/16/11 0200 122/65 mmHg - - 78  23  96 % -  10/16/11 0130 128/63 mmHg - - 78  27  98 % -  10/16/11 0100 121/70 mmHg - - 80  24  96 % -  10/16/11 0030 134/70 mmHg - - 80  24  98 % -  10/16/11 0000 142/65 mmHg 97.7 F (36.5 C) Oral 83  22  97 % -  10/15/11 2330 142/67 mmHg - - 78  25  96 % -  10/15/11 2315 - - - - 20  98 % -  10/15/11 2300 145/71 mmHg - - 70  23  71 % -  10/15/11 2200 154/72 mmHg - - 80  26  97 % -  10/15/11 2100 130/72 mmHg - - 81  24  96 % -  10/15/11 2000 128/75 mmHg - - 91  24  93 % -  10/15/11 1911 - 97.6 F (36.4 C) Oral - - - -  10/15/11 1900 147/77 mmHg - - 87  21  94 % -  10/15/11 1800 133/75 mmHg - - 43  25  93 % -  10/15/11 1700 149/72 mmHg - - 78  25  96 % -  10/15/11 1600 144/72 mmHg - - 82  23  96 % -  10/15/11 1547 - 97.3 F (36.3 C) Oral - - - -  10/15/11 1500 145/71 mmHg - - 82  26  95 % -  10/15/11 1400 150/88 mmHg - - 81  22  95 % -  10/15/11 1300 152/70 mmHg - - 78  27  96 % -  10/15/11 1245 - - - 77  23  96 % -  10/15/11 1230 137/71 mmHg - -  74  25  97 % -  10/15/11 1215 143/68 mmHg - - 76  26  96 % -  10/15/11 1200 145/70 mmHg 99 F (37.2 C) - 76  26  98 % -  10/15/11 1145 135/69 mmHg 98.8  F (37.1 C) - 75  27  96 % -  10/15/11 1130 138/68 mmHg 99 F (37.2 C) - 75  26  97 % -  10/15/11 1115 134/64 mmHg 98.8 F (37.1 C) - 77  26  98 % -  10/15/11 1100 136/63 mmHg 98.4 F (36.9 C) - 76  30  93 % -  10/15/11 1045 - 98.4 F (36.9 C) - 76  23  95 % -  10/15/11 1030 139/67 mmHg 98.4 F (36.9 C) - 80  23  98 % -  10/15/11 1015 - 98.1 F (36.7 C) - 81  25  95 % -  10/15/11 1000 143/89 mmHg 98.2 F (36.8 C) - 82  23  96 % -  10/15/11 0945 - 98.2 F (36.8 C) - 81  22  95 % -  10/15/11 0930 135/95 mmHg 98.1 F (36.7 C) - 81  24  95 % -  10/15/11 0915 - 98.2 F (36.8 C) - 80  30  94 % -  10/15/11 0900 131/73 mmHg 98.4 F (36.9 C) - 79  22  94 % -  10/15/11 0845 - 98.2 F (36.8 C) - 77  24  96 % -  10/15/11 0830 138/77 mmHg 98.2 F (36.8 C) - 78  23  96 % -  10/15/11 0815 - 98.1 F (36.7 C) - 79  25  94 % -  10/15/11 0800 139/75 mmHg 98.1 F (36.7 C) - 79  23  95 % -   Current Weight  10/16/11 189 lb 13.1 oz (86.1 kg)  Pre-op wt= 88.2 kg   Intake/Output from previous day: 07/16 0701 - 07/17 0700 In: 1002.1 [P.O.:250; I.V.:502.1; IV Piggyback:250] Out: 2940 [Urine:2940]  CBGs 100-129-97-111-115-105   PHYSICAL EXAM:  Heart: RRR, good valve click Lungs: slightly decreased in bases Wound: clean and dry Extremities: trace LE edema    Lab Results: CBC: Basename 10/16/11 0410 10/15/11 0416  WBC 19.3* 20.2*  HGB 11.1* 11.9*  HCT 34.5* 36.3*  PLT 158 123*   BMET:  Basename 10/16/11 0410 10/15/11 0416  NA 144 143  K 2.7* 3.1*  CL 102 100  CO2 30 30  GLUCOSE 115* 113*  BUN 30* 35*  CREATININE 0.83 0.85  CALCIUM 8.8 9.4    PT/INR:  Basename 10/16/11 0410  LABPROT 21.7*  INR 1.85*    CXR: IMPRESSION:  Cardiomegaly with mild to moderate interstitial edema, mildly  improved.  Patchy lingular  and left lower lobe opacities, possibly asymmetric  edema or atelectasis, pneumonia not entirely excluded.  Small left pleural effusion, decreased.   Assessment/Plan: S/P Procedure(s) (LRB): MEDIASTINAL EXPLORATION (N/A)  CV- maintaining SR.  BPs significantly improved- will monitor as BP trends up.  Now that he is taking po's, will convert Amio  to po.  Restart po Lopressor.  ARF/ATN- resolved.  Cr stable, UOP excellent.  Pulm- stable off vent.  Continue pulm toilet.  Nutrition- tolerating regular diet.    Hypokalemia- K+ being replaced.  Deconditioning- PT consult.  Vol overload- off diuretics at present.  Watch.  Coumadin for mechanical valve.  Leukocytosis, no fever.  WBC trending down.  Cx's negative.  Likely atelectasis. Off abx.   LOS: 9 days    COLLINS,GINA H 10/16/2011  Patient seen and examined, agree with above A & P Mobilize with PT Will give 5 mg coumadin today as INR still < 2.0

## 2011-10-16 NOTE — Progress Notes (Signed)
Dr. Dorris Fetch paged for Potassium of 2.7. TCTS KCL protocol completed before notifying MD. Order received to give oral KCL throughout day following IV doses.

## 2011-10-16 NOTE — Progress Notes (Signed)
Clinical Social Work Department CLINICAL SOCIAL WORK PLACEMENT NOTE 10/16/2011  Patient:  [name], [name]  Account Number:  [id number] Admit date:  10/07/2011  Clinical Social Worker:  Bonnye Fava, LCSW  Date/time:  10/16/2011 11:30 AM  Clinical Social Work is seeking post-discharge placement for this patient at the following level of care:   SKILLED NURSING   (*CSW will update this form in Epic as items are completed)   10/16/2011  Patient/family provided with [facility] Department of Clinical Social Work's list of facilities offering this level of care within the geographic area requested by the patient (or if unable, by the patient's family).  10/16/2011  Patient/family informed of their freedom to choose among providers that offer the needed level of care, that participate in Medicare, Medicaid or managed care program needed by the patient, have an available bed and are willing to accept the patient.  10/16/2011  Patient/family informed of [facility]' ownership interest in [facility], as well as of the fact that they are under no obligation to receive care at this facility.  PASARR submitted to EDS on  PASARR number received from EDS on   FL2 transmitted to all facilities in geographic area requested by pt/family on  10/16/2011 FL2 transmitted to all facilities within larger geographic area on   Patient informed that his/her managed care company has contracts with or will negotiate with  certain facilities, including the following:     Patient/family informed of bed offers received:   Patient chooses bed at  Physician recommends and patient chooses bed at    Patient to be transferred to  on   Patient to be transferred to facility by   The following physician request were entered in Epic:   Additional Comments: Pt with pre-existing 60 day pasarr  Jodean Lima, [phone]

## 2011-10-17 ENCOUNTER — Inpatient Hospital Stay (HOSPITAL_COMMUNITY): Payer: Medicare Other

## 2011-10-17 LAB — CATH TIP CULTURE: Culture: NO GROWTH

## 2011-10-17 LAB — CBC
Platelets: 188 10*3/uL (ref 150–400)
RBC: 3.75 MIL/uL — ABNORMAL LOW (ref 4.22–5.81)
WBC: 18.2 10*3/uL — ABNORMAL HIGH (ref 4.0–10.5)

## 2011-10-17 LAB — BASIC METABOLIC PANEL
CO2: 27 mEq/L (ref 19–32)
Calcium: 8.7 mg/dL (ref 8.4–10.5)
Chloride: 108 mEq/L (ref 96–112)
Potassium: 3.4 mEq/L — ABNORMAL LOW (ref 3.5–5.1)
Sodium: 144 mEq/L (ref 135–145)

## 2011-10-17 LAB — GLUCOSE, CAPILLARY
Glucose-Capillary: 108 mg/dL — ABNORMAL HIGH (ref 70–99)
Glucose-Capillary: 118 mg/dL — ABNORMAL HIGH (ref 70–99)
Glucose-Capillary: 88 mg/dL (ref 70–99)
Glucose-Capillary: 94 mg/dL (ref 70–99)

## 2011-10-17 LAB — PROTIME-INR: Prothrombin Time: 27.7 seconds — ABNORMAL HIGH (ref 11.6–15.2)

## 2011-10-17 MED ORDER — AMIODARONE HCL 200 MG PO TABS
200.0000 mg | ORAL_TABLET | Freq: Two times a day (BID) | ORAL | Status: DC
  Administered 2011-10-17 – 2011-10-23 (×13): 200 mg via ORAL
  Filled 2011-10-17 (×16): qty 1

## 2011-10-17 MED ORDER — METOPROLOL TARTRATE 25 MG PO TABS
25.0000 mg | ORAL_TABLET | Freq: Two times a day (BID) | ORAL | Status: DC
  Administered 2011-10-17 – 2011-10-23 (×13): 25 mg via ORAL
  Filled 2011-10-17 (×16): qty 1

## 2011-10-17 MED ORDER — POTASSIUM CHLORIDE 10 MEQ/50ML IV SOLN
INTRAVENOUS | Status: AC
  Administered 2011-10-17: 10 meq via INTRAVENOUS
  Filled 2011-10-17: qty 50

## 2011-10-17 MED ORDER — PANTOPRAZOLE SODIUM 40 MG PO TBEC
40.0000 mg | DELAYED_RELEASE_TABLET | Freq: Every day | ORAL | Status: DC
  Administered 2011-10-17 – 2011-10-23 (×7): 40 mg via ORAL
  Filled 2011-10-17 (×6): qty 1

## 2011-10-17 MED ORDER — WARFARIN SODIUM 2.5 MG PO TABS
2.5000 mg | ORAL_TABLET | Freq: Every day | ORAL | Status: DC
  Administered 2011-10-17: 2.5 mg via ORAL
  Filled 2011-10-17 (×2): qty 1

## 2011-10-17 MED ORDER — POTASSIUM CHLORIDE 10 MEQ/50ML IV SOLN
10.0000 meq | INTRAVENOUS | Status: AC | PRN
  Administered 2011-10-17 (×3): 10 meq via INTRAVENOUS
  Filled 2011-10-17: qty 100

## 2011-10-17 NOTE — Progress Notes (Signed)
  Patient ID: [name], male   DOB: 1950/06/23, 61 y.o.   MRN: [id number]  Filed Vitals:   10/17/11 1200 10/17/11 1300 10/17/11 1400 10/17/11 1541  BP: 152/70 126/80 134/79   Pulse: 77 83 79   Temp:    98.2 F (36.8 C)  TempSrc:    Oral  Resp: 23 23 20    Height:      Weight:      SpO2: 96% 96% 96%    No new problems today  Making good progress.

## 2011-10-17 NOTE — Progress Notes (Addendum)
[address]            [place] [address]          [phone]     9 Days Post-Op Procedure(s) (LRB): MEDIASTINAL EXPLORATION (N/A)  Subjective: Still weak, but motivated.  Tolerating diet. No new complaints.  Objective: Vital signs in last 24 hours: Patient Vitals for the past 24 hrs:  BP Temp Temp src Pulse Resp SpO2 Weight  10/17/11 0730 - 97.7 F (36.5 C) Oral - - - -  10/17/11 0700 140/75 mmHg - - 75  23  96 % -  10/17/11 0600 162/78 mmHg - - 79  25  93 % -  10/17/11 0500 131/83 mmHg - - 72  22  92 % 182 lb 5.1 oz (82.7 kg)  10/17/11 0400 140/73 mmHg 97.8 F (36.6 C) Oral 77  - 90 % -  10/17/11 0300 144/79 mmHg - - 73  - 92 % -  10/17/11 0200 138/75 mmHg - - 75  - 90 % -  10/17/11 0100 146/79 mmHg - - 80  - 90 % -  10/17/11 0000 143/76 mmHg 97.4 F (36.3 C) Oral 80  24  91 % -  10/16/11 2300 146/73 mmHg - - 75  23  94 % -  10/16/11 2200 152/66 mmHg - - 82  24  89 % -  10/16/11 2100 112/66 mmHg - - 80  22  87 % -  10/16/11 2000 122/61 mmHg - - 81  21  97 % -  10/16/11 1929 - 97.8 F (36.6 C) Oral - - - -  10/16/11 1900 119/76 mmHg - - 94  17  92 % -  10/16/11 1800 128/80 mmHg - - 82  20  92 % -  10/16/11 1700 139/83 mmHg - - 85  21  95 % -  10/16/11 1600 151/72 mmHg - - 82  25  94 % -  10/16/11 1540 - 98.4 F (36.9 C) Oral - - - -  10/16/11 1500 142/77 mmHg - - 78  24  94 % -  10/16/11 1400 147/81 mmHg - - 80  24  92 % -  10/16/11 1300 158/86 mmHg - - 82  25  91 % -  10/16/11 1200 142/70 mmHg - - 79  25  92 % -  10/16/11 1139 - 97.6 F (36.4 C) Oral - - - -  10/16/11 1100 137/72 mmHg - - 77  24  93 % -  10/16/11 1000 149/72 mmHg - - 83  19  93 % -  10/16/11 0900 155/80 mmHg - - 80  20  95 % -  10/16/11 0800 120/68 mmHg - - 71  23  95 % -  10/16/11 0753 - 97.5 F (36.4 C) Oral - - - -   Current Weight  10/17/11 182 lb 5.1 oz (82.7 kg)   Pre-op wt= 88.2 kg   Intake/Output from previous day: 07/17 0701 - 07/18  0700 In: 1062.8 [P.O.:560; I.V.:402.8; IV Piggyback:100] Out: 1960 [Urine:1960]  CBGs [phone]  PHYSICAL EXAM:  Heart: RRR Lungs: decreased BS in bases Wound: clean and dry Extremities: mild LE edema    Lab Results: CBC: Basename 10/17/11 0450 10/16/11 0410  WBC 18.2* 19.3*  HGB 11.0* 11.1*  HCT 34.1* 34.5*  PLT 188 158   BMET:  Basename 10/17/11 0450 10/16/11 0410  NA 144 144  K 3.4* 2.7*  CL 108 102  CO2 27 30  GLUCOSE 104* 115*  BUN 29* 30*  CREATININE 0.80 0.83  CALCIUM 8.7 8.8    PT/INR:  Basename 10/17/11 0450  LABPROT 27.7*  INR 2.53*   CXR: IMPRESSION:  Mild edema, mild left perihilar air space disease and small left  pleural effusion.    Assessment/Plan: S/P Procedure(s) (LRB): MEDIASTINAL EXPLORATION (N/A) CV- maintaining SR. BPs still up.  Continue Amio, Lopressor.  Titrate beta blocker as toleratd. ARF/ATN- resolved. Cr stable, UOP excellent. ?d/c Foley soon. Pulm- stable off vent. Continue pulm toilet.  Nutrition- tolerating regular diet.  Hypokalemia-replace K+ . Deconditioning- awaiting PT consult. Continue to work on reconditioning.  Still very weak with mobility. Coumadin for mechanical valve. INR up after 5 mg yesterday-watch. Leukocytosis, no fever. WBC trending down. Cx's negative. Likely atelectasis. Off abx.   LOS: 10 days    COLLINS,GINA H 10/17/2011  Patient seen and examined, agree with above Will give 2.5 mg coumadin today Deconditioning is the major issue currently

## 2011-10-17 NOTE — Evaluation (Signed)
Physical Therapy Evaluation Patient Details Name: [name] MRN: [id number] DOB: 1950/04/06 Today's Date: 10/17/2011 Time: 1008-1040 PT Time Calculation (min): 32 min  PT Assessment / Plan / Recommendation Clinical Impression  pt presents s/p Mediastinal Exploration and Repair of Ascending Aortic Aneurysm.  pt globalled weak and debilitated.  pt plans to D/C to SNF for continued rehab.      PT Assessment  Patient needs continued PT services    Follow Up Recommendations  Skilled nursing facility    Barriers to Discharge None      Equipment Recommendations  Defer to next venue    Recommendations for Other Services     Frequency Min 3X/week    Precautions / Restrictions Precautions Precautions: Sternal;Fall Restrictions Weight Bearing Restrictions: No   Pertinent Vitals/Pain Pt indicates tightness around incisions.        Mobility  Bed Mobility Bed Mobility: Sit to Supine Sit to Supine: 1: +2 Total assist Sit to Supine: Patient Percentage: 40% Details for Bed Mobility Assistance: cues for sternal precautions, safe technique Transfers Transfers: Sit to Stand;Stand to Sit;Stand Pivot Transfers Sit to Stand: 1: +2 Total assist;With upper extremity assist;From chair/3-in-1 Sit to Stand: Patient Percentage: 50% Stand to Sit: 1: +2 Total assist;With upper extremity assist;To bed Stand to Sit: Patient Percentage: 60% Stand Pivot Transfers: 1: +2 Total assist Stand Pivot Transfers: Patient Percentage: 50% Details for Transfer Assistance: cues for UEs to knees with transfers and to use momentum with sit to stand.  cues to control descent to bed.   Ambulation/Gait Ambulation/Gait Assistance: Not tested (comment) Stairs: No Wheelchair Mobility Wheelchair Mobility: No    Exercises     PT Diagnosis: Difficulty walking;Acute pain  PT Problem List: Decreased strength;Decreased activity tolerance;Decreased balance;Decreased mobility;Decreased knowledge of use of DME;Decreased  knowledge of precautions;Cardiopulmonary status limiting activity;Pain PT Treatment Interventions: DME instruction;Gait training;Stair training;Functional mobility training;Therapeutic activities;Therapeutic exercise;Balance training;Patient/family education   PT Goals Acute Rehab PT Goals PT Goal Formulation: With patient Time For Goal Achievement: 10/31/11 Potential to Achieve Goals: Good Pt will Roll Supine to Right Side: with supervision PT Goal: Rolling Supine to Right Side - Progress: Goal set today Pt will Roll Supine to Left Side: with supervision PT Goal: Rolling Supine to Left Side - Progress: Goal set today Pt will go Supine/Side to Sit: with supervision PT Goal: Supine/Side to Sit - Progress: Goal set today Pt will go Sit to Supine/Side: with supervision PT Goal: Sit to Supine/Side - Progress: Goal set today Pt will go Sit to Stand: with min assist PT Goal: Sit to Stand - Progress: Goal set today Pt will go Stand to Sit: with min assist PT Goal: Stand to Sit - Progress: Goal set today Pt will Ambulate: >150 feet;with min assist;with rolling walker PT Goal: Ambulate - Progress: Goal set today Additional Goals Additional Goal #1: pt will follow sternal precautions.   PT Goal: Additional Goal #1 - Progress: Goal set today  Visit Information  Last PT Received On: 10/17/11 Assistance Needed: +2    Subjective Data  Subjective: I'm just so weak.   Patient Stated Goal: Live   Prior Functioning  Home Living Lives With: Alone Available Help at Discharge: Family (Sister lives in area, but unable to provide much A.  ) Type of Home: House Home Access: Stairs to enter Secretary/administrator of Steps: 3 Entrance Stairs-Rails: None Home Layout: One level Additional Comments: pt plans on ST-SNF at D/C Prior Function Level of Independence: Independent Able to Take Stairs?: Yes Driving:  Yes Communication Communication: No difficulties    Cognition  Overall Cognitive  Status: Appears within functional limits for tasks assessed/performed Arousal/Alertness: Awake/alert Orientation Level: Appears intact for tasks assessed Behavior During Session: [facility] for tasks performed    Extremity/Trunk Assessment Right Lower Extremity Assessment RLE ROM/Strength/Tone: Deficits RLE ROM/Strength/Tone Deficits: Grossly 3/5 strength RLE Sensation: WFL - Light Touch Left Lower Extremity Assessment LLE ROM/Strength/Tone: Deficits LLE ROM/Strength/Tone Deficits: Grossly 3/5 strength LLE Sensation: WFL - Light Touch   Balance Balance Balance Assessed: Yes Static Sitting Balance Static Sitting - Balance Support: Bilateral upper extremity supported;Feet supported Static Sitting - Level of Assistance: 5: Stand by assistance Static Sitting - Comment/# of Minutes: Sat EOB ~5 mins.    End of Session PT - End of Session Equipment Utilized During Treatment: Gait belt Activity Tolerance: Patient limited by fatigue Patient left: in bed;with call bell/phone within reach Nurse Communication: Mobility status  GP     Sunny Schlein, [place] [phone] 10/17/2011, 11:09 AM

## 2011-10-18 LAB — GLUCOSE, CAPILLARY
Glucose-Capillary: 89 mg/dL (ref 70–99)
Glucose-Capillary: 122 mg/dL — ABNORMAL HIGH (ref 70–99)

## 2011-10-18 LAB — BASIC METABOLIC PANEL
CO2: 23 mEq/L (ref 19–32)
Calcium: 8.6 mg/dL (ref 8.4–10.5)
Creatinine, Ser: 0.74 mg/dL (ref 0.50–1.35)

## 2011-10-18 LAB — PROTIME-INR: INR: 4.16 — ABNORMAL HIGH (ref 0.00–1.49)

## 2011-10-18 LAB — CBC
MCH: 28.6 pg (ref 26.0–34.0)
MCV: 91.1 fL (ref 78.0–100.0)
Platelets: 204 10*3/uL (ref 150–400)
RDW: 16 % — ABNORMAL HIGH (ref 11.5–15.5)
WBC: 15.5 10*3/uL — ABNORMAL HIGH (ref 4.0–10.5)

## 2011-10-18 MED ORDER — POTASSIUM CHLORIDE 10 MEQ/50ML IV SOLN
10.0000 meq | INTRAVENOUS | Status: DC | PRN
  Administered 2011-10-18 (×2): 10 meq via INTRAVENOUS
  Filled 2011-10-18: qty 50
  Filled 2011-10-18: qty 150
  Filled 2011-10-18 (×2): qty 50

## 2011-10-18 MED ORDER — TRAMADOL HCL 50 MG PO TABS
100.0000 mg | ORAL_TABLET | Freq: Four times a day (QID) | ORAL | Status: DC | PRN
  Administered 2011-10-23: 100 mg via ORAL
  Filled 2011-10-18 (×3): qty 1

## 2011-10-18 MED ORDER — TRAMADOL HCL 50 MG PO TABS
50.0000 mg | ORAL_TABLET | Freq: Four times a day (QID) | ORAL | Status: DC | PRN
  Administered 2011-10-18 (×2): 50 mg via ORAL
  Filled 2011-10-18: qty 1

## 2011-10-18 MED ORDER — POTASSIUM CHLORIDE 10 MEQ/50ML IV SOLN
INTRAVENOUS | Status: AC
  Administered 2011-10-18: 10 meq
  Filled 2011-10-18: qty 50

## 2011-10-18 NOTE — Evaluation (Signed)
Occupational Therapy Evaluation Patient Details Name: [name] MRN: [id number] DOB: May 26, 1950 Today's Date: 10/18/2011 Time: 0865-7846 OT Time Calculation (min): 30 min  OT Assessment / Plan / Recommendation Clinical Impression  Pt presents s/p Mediastinal Exploration and Repair of Ascending Aortic Aneurysm.  He has poor activity tolerance for ADL and mobility.  Plan to follow to instruct in ADL adhering to sternal precautions and improve endurance.     OT Assessment  Patient needs continued OT Services     Follow Up Recommendations  Skilled nursing facility    Barriers to Discharge Decreased caregiver support    Equipment Recommendations  Defer to next venue    Recommendations for Other Services    Frequency  Min 2X/week    Precautions / Restrictions Precautions Precautions: Sternal;Fall Restrictions Weight Bearing Restrictions: No   Pertinent Vitals/Pain Sorness under arms    ADL  Eating/Feeding: Simulated;Independent Where Assessed - Eating/Feeding: Chair Grooming: Simulated;Moderate assistance Where Assessed - Grooming: Unsupported sitting Upper Body Bathing: Simulated;Moderate assistance Where Assessed - Upper Body Bathing: Unsupported sitting Lower Body Bathing: Simulated;Moderate assistance Where Assessed - Lower Body Bathing: Supported sit to stand Upper Body Dressing: Performed;Moderate assistance Where Assessed - Upper Body Dressing: Unsupported sitting Lower Body Dressing: Performed;Supervision/safety Where Assessed - Lower Body Dressing: Unsupported sitting (socks only crossing foot over opposite knee) Toilet Transfer: Simulated;Minimal assistance Toilet Transfer Method: Stand pivot Equipment Used: Gait belt Transfers/Ambulation Related to ADLs: ambulated with +2 hand held assist pt 90% chair following, verbal cues to adhere to sternal precautions for sit to stand. ADL Comments: Pt demonstrating weakness and c/o soreness in B shoulders.  Able to FF  only to approximately 60 degrees.    OT Diagnosis: Generalized weakness;Acute pain  OT Problem List: Decreased strength;Decreased range of motion;Decreased activity tolerance;Impaired balance (sitting and/or standing);Decreased knowledge of use of DME or AE;Cardiopulmonary status limiting activity;Impaired UE functional use;Pain OT Treatment Interventions: Self-care/ADL training;DME and/or AE instruction;Patient/family education;Balance training   OT Goals Acute Rehab OT Goals OT Goal Formulation: With patient Time For Goal Achievement: 11/01/11 Potential to Achieve Goals: Good ADL Goals Pt Will Perform Grooming: with supervision;Standing at sink ADL Goal: Grooming - Progress: Goal set today Pt Will Perform Upper Body Bathing: with supervision;Sitting, edge of bed ADL Goal: Upper Body Bathing - Progress: Goal set today Pt Will Perform Lower Body Bathing: with min assist;Sit to stand from bed ADL Goal: Lower Body Bathing - Progress: Goal set today Pt Will Perform Upper Body Dressing: with supervision;Sitting, bed ADL Goal: Upper Body Dressing - Progress: Goal set today Pt Will Perform Lower Body Dressing: with min assist;Sit to stand from bed ADL Goal: Lower Body Dressing - Progress: Goal set today Pt Will Transfer to Toilet: Ambulation;with min assist;with DME;3-in-1 ADL Goal: Toilet Transfer - Progress: Goal set today Pt Will Perform Toileting - Clothing Manipulation: with supervision;Standing ADL Goal: Toileting - Clothing Manipulation - Progress: Goal set today Pt Will Perform Toileting - Hygiene: with supervision;Sit to stand from 3-in-1/toilet ADL Goal: Toileting - Hygiene - Progress: Goal set today Miscellaneous OT Goals Miscellaneous OT Goal #1: Pt will adhere to sternal precautions with min verbal cues during ADL and mobility. OT Goal: Miscellaneous Goal #1 - Progress: Goal set today Miscellaneous OT Goal #2: Pt will independently pace his activity level during ADL to conserve  energy. OT Goal: Miscellaneous Goal #2 - Progress: Goal set today  Visit Information  Last OT Received On: 10/18/11 Assistance Needed: +2 PT/OT Co-Evaluation/Treatment: Yes    Subjective Data  Subjective: "  I need to get my strength back." Patient Stated Goal: Get strength back   Prior Functioning  Vision/Perception  Home Living Lives With: Alone Available Help at Discharge: Family Type of Home: House Home Access: Stairs to enter Secretary/administrator of Steps: 3 Entrance Stairs-Rails: None Home Layout: One level Additional Comments: pt plans on ST-SNF at D/C Prior Function Level of Independence: Independent Able to Take Stairs?: Yes Driving: Yes Communication Communication: No difficulties Dominant Hand: Right      Cognition  Overall Cognitive Status: Impaired Area of Impairment: Problem solving;Other (comment) (slow response/processing speed) Arousal/Alertness: Awake/alert Orientation Level: Appears intact for tasks assessed Behavior During Session: [facility] for tasks performed    Extremity/Trunk Assessment Right Upper Extremity Assessment RUE ROM/Strength/Tone: Unable to fully assess;Due to precautions Left Upper Extremity Assessment LUE ROM/Strength/Tone: Unable to fully assess;Due to precautions Trunk Assessment Trunk Assessment: Normal   Mobility Bed Mobility Bed Mobility: Not assessed Transfers Transfers: Sit to Stand;Stand to Sit Sit to Stand: 4: Min assist;Without upper extremity assist;From chair/3-in-1 Stand to Sit: 4: Min assist;To chair/3-in-1;Without upper extremity assist Details for Transfer Assistance: verbal cues to adhere to sternal precautions with sit to and from stand.   Exercise  Pt instructed to perform AROM B shoulders to 90 degrees several times per day.  Balance Balance Balance Assessed: Yes Static Sitting Balance Static Sitting - Balance Support: Bilateral upper extremity supported;Feet supported Static Sitting - Level of Assistance: 5:  Stand by assistance  End of Session OT - End of Session Activity Tolerance: Patient limited by fatigue Patient left: in chair;with call bell/phone within reach  GO     Evern Bio 10/18/2011, 2:52 PM [phone]

## 2011-10-18 NOTE — Progress Notes (Signed)
Clinical Social Work-CSW continuing to follow for d/c planning PRN-no Facilities able to offer spot in [place]-CSW expanded bed search and will f/u Monday with offers.  Jodean Lima, [phone]

## 2011-10-18 NOTE — Progress Notes (Signed)
Physical Therapy Treatment Patient Details Name: [name] MRN: [id number] DOB: [birth year] Today's Date: 10/18/2011 Time: 4098-1191 PT Time Calculation (min): 30 min  PT Assessment / Plan / Recommendation Comments on Treatment Session  Noted pt with elevated INR (4.10) and coumadin on hold today (last dose 7/18 @ 1800). Pt with no s/s of bleeding and very eager to try to walk. Had tolerated OOB to chair with nursing well and decided to proceed based on risks vs benefits. Pt very appreciative of incr activity.    Follow Up Recommendations  Skilled nursing facility    Barriers to Discharge        Equipment Recommendations  Defer to next venue    Recommendations for Other Services    Frequency Min 3X/week   Plan Discharge plan remains appropriate;Frequency remains appropriate    Precautions / Restrictions Precautions Precautions: Sternal;Fall Restrictions Weight Bearing Restrictions: No   Pertinent Vitals/Pain HR stable on monitor throughout (in ICU)    Mobility  Bed Mobility Bed Mobility: Not assessed Transfers Transfers: Sit to Stand;Stand to Sit Sit to Stand: 4: Min assist;Without upper extremity assist;From chair/3-in-1 Stand to Sit: 4: Min assist;To chair/3-in-1;Without upper extremity assist Details for Transfer Assistance: verbal cues to adhere to sternal precautions with sit to and from stand. Ambulation/Gait Ambulation/Gait Assistance: 1: +2 Total assist Ambulation/Gait: Patient Percentage: 90% Ambulation Distance (Feet): 30 Feet Assistive device: 2 person hand held assist Ambulation/Gait Assistance Details: Pt felt very weak and required 2 people for safety (to manage IV and bring chair);  Gait Pattern: Step-through pattern;Decreased stride length;Wide base of support    Exercises     PT Diagnosis:    PT Problem List:   PT Treatment Interventions:     PT Goals Acute Rehab PT Goals Pt will go Sit to Stand: with min assist PT Goal: Sit to Stand - Progress:  Progressing toward goal Pt will go Stand to Sit: with min assist PT Goal: Stand to Sit - Progress: Progressing toward goal Pt will Ambulate: >150 feet;with min assist;with rolling walker PT Goal: Ambulate - Progress: Progressing toward goal Additional Goals Additional Goal #1: pt will follow sternal precautions.   PT Goal: Additional Goal #1 - Progress: Progressing toward goal  Visit Information  Last PT Received On: 10/18/11 Assistance Needed: +2    Subjective Data  Subjective: I just need some energy Patient Stated Goal: to get stronger   Cognition  Overall Cognitive Status: Impaired Area of Impairment: Problem solving;Other (comment) Arousal/Alertness: Awake/alert Orientation Level: Appears intact for tasks assessed Behavior During Session: [facility] for tasks performed    Balance  Balance Balance Assessed: Yes Static Sitting Balance Static Sitting - Balance Support: Bilateral upper extremity supported;Feet supported Static Sitting - Level of Assistance: 5: Stand by assistance  End of Session PT - End of Session Equipment Utilized During Treatment: Gait belt Activity Tolerance: Patient limited by fatigue Patient left: in chair;with call bell/phone within reach Nurse Communication: Mobility status   GP     [name] 10/18/2011, 5:40 PM Pager [phone]

## 2011-10-18 NOTE — Progress Notes (Signed)
Patient's am K+ 3.2, cr 0.74 and uop>20cc/hr.  Will replace K+ per TCTS protocol.

## 2011-10-18 NOTE — Progress Notes (Addendum)
[address]            [place] [address]          [phone]     10 Days Post-Op Procedure(s) (LRB): MEDIASTINAL EXPLORATION (N/A)  Subjective: Not feeling as well this am. Sore, tired.  Objective: Vital signs in last 24 hours: Patient Vitals for the past 24 hrs:  BP Temp Temp src Pulse Resp SpO2 Weight  10/18/11 0738 - 97.8 F (36.6 C) Oral - - - -  10/18/11 0500 136/73 mmHg - - 72  20  97 % 180 lb 1.9 oz (81.7 kg)  10/18/11 0400 149/70 mmHg 98.5 F (36.9 C) Oral 65  20  99 % -  10/18/11 0300 118/76 mmHg - - 73  22  96 % -  10/18/11 0200 133/74 mmHg - - 67  28  96 % -  10/18/11 0100 136/69 mmHg - - 65  25  99 % -  10/18/11 0000 138/76 mmHg 98 F (36.7 C) Oral 70  21  95 % -  10/17/11 2300 131/75 mmHg - - 71  23  96 % -  10/17/11 2200 134/79 mmHg - - 74  30  96 % -  10/17/11 2159 139/72 mmHg - - 72  - - -  10/17/11 2100 - - - 70  22  97 % -  10/17/11 2000 96/78 mmHg - - 78  22  93 % -  10/17/11 1917 - 98.1 F (36.7 C) Oral - - - -  10/17/11 1900 134/72 mmHg - - 111  - 100 % -  10/17/11 1800 157/87 mmHg - - 77  - 99 % -  10/17/11 1700 131/70 mmHg - - 72  21  95 % -  10/17/11 1600 123/69 mmHg - - 77  22  96 % -  10/17/11 1541 - 98.2 F (36.8 C) Oral - - - -  10/17/11 1500 142/78 mmHg - - 76  21  97 % -  10/17/11 1400 134/79 mmHg - - 79  20  96 % -  10/17/11 1300 126/80 mmHg - - 83  23  96 % -  10/17/11 1200 152/70 mmHg - - 77  23  96 % -  10/17/11 1130 - 98.1 F (36.7 C) Axillary - - - -  10/17/11 1100 131/68 mmHg - - 72  24  93 % -  10/17/11 1000 114/83 mmHg - - 83  23  94 % -  10/17/11 0900 126/75 mmHg - - 76  21  99 % -  10/17/11 0800 142/83 mmHg - - 74  34  95 % -   Current Weight  10/18/11 180 lb 1.9 oz (81.7 kg)   Pre-op wt= 88.2 kg   Intake/Output from previous day: 07/18 0701 - 07/19 0700 In: 823 [P.O.:300; I.V.:423; IV Piggyback:100] Out: 676 [Urine:675; Stool:1]  CBGs 118-121-83-84-92  PHYSICAL  EXAM:  Heart: RRR, good valve click Lungs: decreased in bases, but clear Wound: clean and dry Extremities:no significant LE edema    Lab Results: CBC: Basename 10/18/11 0408 10/17/11 0450  WBC 15.5* 18.2*  HGB 10.6* 11.0*  HCT 33.7* 34.1*  PLT 204 188   BMET:  Basename 10/18/11 0408 10/17/11 0450  NA 144 144  K 3.2* 3.4*  CL 110 108  CO2 23 27  GLUCOSE 92 104*  BUN 23 29*  CREATININE  0.74 0.80  CALCIUM 8.6 8.7    PT/INR:  Basename 10/18/11 0408  LABPROT 40.8*  INR 4.16*      Assessment/Plan: S/P Procedure(s) (LRB): MEDIASTINAL EXPLORATION (N/A) CV- SR. BPs improved. Continue Amio, Lopressor. ARF/ATN- resolved. Cr stable.    Pulm- Continue pulm toilet.  Nutrition- tolerating regular diet.  Hypokalemia-replace K+ .  Deconditioning- Continue PT/rehab.  Supratherapeutic INR.  Will hold Coumadin today. Probably can resume in am for mechanical valve. WBC almost back to baseline.  Monitor.     LOS: 11 days    COLLINS,GINA H 10/18/2011  Patient seen and examined. Agree with above.

## 2011-10-19 LAB — CBC
MCH: 29.2 pg (ref 26.0–34.0)
MCHC: 32 g/dL (ref 30.0–36.0)
Platelets: 213 10*3/uL (ref 150–400)
RBC: 3.59 MIL/uL — ABNORMAL LOW (ref 4.22–5.81)
RDW: 16 % — ABNORMAL HIGH (ref 11.5–15.5)

## 2011-10-19 LAB — BASIC METABOLIC PANEL
Calcium: 8.5 mg/dL (ref 8.4–10.5)
GFR calc non Af Amer: >90 mL/min (ref >90)
Glucose, Bld: 95 mg/dL (ref 70–99)
Sodium: 144 mEq/L (ref 135–145)

## 2011-10-19 LAB — GLUCOSE, CAPILLARY: Glucose-Capillary: 92 mg/dL (ref 70–99)

## 2011-10-19 MED ORDER — ALPRAZOLAM 0.5 MG PO TABS
0.5000 mg | ORAL_TABLET | Freq: Three times a day (TID) | ORAL | Status: DC
Start: 2011-10-19 — End: 2011-10-20
  Administered 2011-10-19 (×3): 0.5 mg via ORAL
  Filled 2011-10-19 (×3): qty 1

## 2011-10-19 MED ORDER — POTASSIUM CHLORIDE 10 MEQ/50ML IV SOLN
10.0000 meq | INTRAVENOUS | Status: AC | PRN
  Administered 2011-10-19 (×3): 10 meq via INTRAVENOUS

## 2011-10-19 NOTE — Progress Notes (Signed)
11 Days Post-Op Procedure(s) (LRB): MEDIASTINAL EXPLORATION (N/A) Subjective: Still very weak Denies pain Appetite fair  Objective: Vital signs in last 24 hours: Temp:  [97.6 F (36.4 C)-98.5 F (36.9 C)] 97.6 F (36.4 C) (07/20 0700) Pulse Rate:  [25-117] 68  (07/20 0700) Cardiac Rhythm:  [-] Normal sinus rhythm (07/20 0600) Resp:  [14-23] 14  (07/20 0500) BP: (111-147)/(66-120) 147/120 mmHg (07/20 0700) SpO2:  [79 %-100 %] 97 % (07/20 0600) Weight:  [180 lb 8.9 oz (81.9 kg)] 180 lb 8.9 oz (81.9 kg) (07/20 0500)  Hemodynamic parameters for last 24 hours:    Intake/Output from previous day: 07/19 0701 - 07/20 0700 In: 1130 [P.O.:560; I.V.:420; IV Piggyback:150] Out: 1725 [Urine:1725] Intake/Output this shift:    Neurologic: intact Heart: regular rate and rhythm Lungs: clear to auscultation bilaterally Abdomen: normal findings: soft, non-tender Wound: clean and dry  Lab Results:  Basename 10/19/11 0400 10/18/11 0408  WBC 16.6* 15.5*  HGB 10.5* 10.6*  HCT 32.8* 33.7*  PLT 213 204   BMET:  Basename 10/19/11 0400 10/18/11 0408  NA 144 144  K 3.6 3.2*  CL 112 110  CO2 21 23  GLUCOSE 95 92  BUN 21 23  CREATININE 0.78 0.74  CALCIUM 8.5 8.6    PT/INR:  Basename 10/19/11 0400  LABPROT 46.4*  INR 4.90*   ABG    Component Value Date/Time   PHART 7.507* 10/14/2011 1233   HCO3 30.8* 10/14/2011 1233   TCO2 32 10/14/2011 1233   ACIDBASEDEF 9.0* 10/07/2011 1801   O2SAT 97.0 10/14/2011 1233   CBG (last 3)   Basename 10/19/11 0713 10/19/11 0412 10/18/11 1941  GLUCAP 80 92 122*    Assessment/Plan: S/P Procedure(s) (LRB): MEDIASTINAL EXPLORATION (N/A) - CV- stable  Anticoagulation for mechanical AVR- INR up again today, continue to hold coumadin  RESP- pulmonary hygiene  RENAL- lytes, creatinine OK  LEUKOCYTOSIS- WBC up slightly today, no apparent source, remains afebrile'  DECONDIIONING- severe, continue PT/OT     LOS: 12 days     Analia Zuk C 10/19/2011

## 2011-10-20 MED ORDER — WARFARIN SODIUM 1 MG PO TABS
1.0000 mg | ORAL_TABLET | Freq: Once | ORAL | Status: AC
  Administered 2011-10-20: 1 mg via ORAL
  Filled 2011-10-20: qty 1

## 2011-10-20 MED ORDER — ALPRAZOLAM 0.5 MG PO TABS
1.0000 mg | ORAL_TABLET | Freq: Three times a day (TID) | ORAL | Status: DC
  Administered 2011-10-20 – 2011-10-23 (×11): 1 mg via ORAL
  Filled 2011-10-20 (×4): qty 2
  Filled 2011-10-20 (×2): qty 1
  Filled 2011-10-20 (×6): qty 2

## 2011-10-20 NOTE — Progress Notes (Signed)
12 Days Post-Op Procedure(s) (LRB): MEDIASTINAL EXPLORATION (N/A) Subjective: Wants to go home Says he was mistreated last PM, but no specific examples  Objective: Vital signs in last 24 hours: Temp:  [97.5 F (36.4 C)-98.2 F (36.8 C)] 97.5 F (36.4 C) (07/21 0719) Pulse Rate:  [74-97] 77  (07/21 0500) Cardiac Rhythm:  [-] Normal sinus rhythm (07/20 2000) Resp:  [16-24] 21  (07/21 0700) BP: (107-144)/(54-91) 110/79 mmHg (07/21 0700) SpO2:  [96 %-100 %] 98 % (07/21 0500) Weight:  [184 lb 4.9 oz (83.6 kg)] 184 lb 4.9 oz (83.6 kg) (07/21 0430)  Hemodynamic parameters for last 24 hours:    Intake/Output from previous day: 07/20 0701 - 07/21 0700 In: 1310 [P.O.:830; I.V.:430; IV Piggyback:50] Out: 2850 [Urine:2850] Intake/Output this shift:    General appearance: alert, no distress and uncooperative Neurologic: intact Heart: regular rate and rhythm Lungs: clear to auscultation bilaterally Wound: clean and dry  Lab Results:  Basename 10/19/11 0400 10/18/11 0408  WBC 16.6* 15.5*  HGB 10.5* 10.6*  HCT 32.8* 33.7*  PLT 213 204   BMET:  Basename 10/19/11 0400 10/18/11 0408  NA 144 144  K 3.6 3.2*  CL 112 110  CO2 21 23  GLUCOSE 95 92  BUN 21 23  CREATININE 0.78 0.74  CALCIUM 8.5 8.6    PT/INR:  Basename 10/20/11 0400  LABPROT 26.8*  INR 2.43*   ABG    Component Value Date/Time   PHART 7.507* 10/14/2011 1233   HCO3 30.8* 10/14/2011 1233   TCO2 32 10/14/2011 1233   ACIDBASEDEF 9.0* 10/07/2011 1801   O2SAT 97.0 10/14/2011 1233   CBG (last 3)   Basename 10/19/11 0713 10/19/11 0412 10/18/11 1941  GLUCAP 80 92 122*    Assessment/Plan: S/P Procedure(s) (LRB): MEDIASTINAL EXPLORATION (N/A) Plan for transfer to step-down: see transfer orders CV- stable, INR 2.4, 1 mg coumadin today  RESP- pulmonary hygiene  RENAL- continues to diurese  DECONDITIONING- did much better with walking yesterday, continue ambulation  Increase xanax to his outpatient dose   LOS: 13 days    Jory Tanguma C 10/20/2011

## 2011-10-21 ENCOUNTER — Encounter (HOSPITAL_COMMUNITY): Payer: Self-pay | Admitting: *Deleted

## 2011-10-21 DIAGNOSIS — I719 Aortic aneurysm of unspecified site, without rupture: Secondary | ICD-10-CM | POA: Insufficient documentation

## 2011-10-21 LAB — CBC
HCT: 32.7 % — ABNORMAL LOW (ref 39.0–52.0)
Hemoglobin: 10.6 g/dL — ABNORMAL LOW (ref 13.0–17.0)
MCH: 30.4 pg (ref 26.0–34.0)
MCHC: 32.4 g/dL (ref 30.0–36.0)
MCV: 93.7 fL (ref 78.0–100.0)
RDW: 17.4 % — ABNORMAL HIGH (ref 11.5–15.5)

## 2011-10-21 LAB — BASIC METABOLIC PANEL
BUN: 18 mg/dL (ref 6–23)
Creatinine, Ser: 0.81 mg/dL (ref 0.50–1.35)
GFR calc Af Amer: >90 mL/min (ref >90)
GFR calc non Af Amer: >90 mL/min (ref >90)
Glucose, Bld: 90 mg/dL (ref 70–99)
Potassium: 3.7 mEq/L (ref 3.5–5.1)

## 2011-10-21 MED ORDER — MAGNESIUM HYDROXIDE 400 MG/5ML PO SUSP: 30.0000 mL | Freq: Every day | ORAL | Status: DC | PRN

## 2011-10-21 MED ORDER — WARFARIN SODIUM 2 MG PO TABS
2.0000 mg | ORAL_TABLET | Freq: Once | ORAL | Status: AC
  Administered 2011-10-21: 2 mg via ORAL
  Filled 2011-10-21: qty 1

## 2011-10-21 MED ORDER — SODIUM CHLORIDE 0.9 % IV SOLN: 250.0000 mL | INTRAVENOUS | Status: DC | PRN

## 2011-10-21 MED ORDER — ACETAMINOPHEN 325 MG PO TABS: 650.0000 mg | ORAL_TABLET | Freq: Four times a day (QID) | ORAL | Status: DC | PRN

## 2011-10-21 MED ORDER — DIPHENHYDRAMINE HCL 25 MG PO CAPS: 25.0000 mg | ORAL_CAPSULE | Freq: Every evening | ORAL | Status: DC | PRN

## 2011-10-21 MED ORDER — SODIUM CHLORIDE 0.9 % IJ SOLN
3.0000 mL | Freq: Two times a day (BID) | INTRAMUSCULAR | Status: DC
  Administered 2011-10-21: 3 mL via INTRAVENOUS

## 2011-10-21 MED ORDER — MOVING RIGHT ALONG BOOK
Freq: Once | Status: DC
  Filled 2011-10-21 (×2): qty 1

## 2011-10-21 MED ORDER — AMOXICILLIN-POT CLAVULANATE 875-125 MG PO TABS: 1.0000 | ORAL_TABLET | Freq: Two times a day (BID) | ORAL | Status: DC

## 2011-10-21 MED ORDER — ALUM & MAG HYDROXIDE-SIMETH 200-200-20 MG/5ML PO SUSP: 15.0000 mL | ORAL | Status: DC | PRN

## 2011-10-21 MED ORDER — AMIODARONE HCL 200 MG PO TABS: 200.0000 mg | ORAL_TABLET | Freq: Two times a day (BID) | ORAL | Status: DC

## 2011-10-21 MED ORDER — GUAIFENESIN-DM 100-10 MG/5ML PO SYRP: 15.0000 mL | ORAL_SOLUTION | ORAL | Status: DC | PRN

## 2011-10-21 MED ORDER — SODIUM CHLORIDE 0.9 % IJ SOLN: 3.0000 mL | INTRAMUSCULAR | Status: DC | PRN

## 2011-10-21 MED ORDER — METOPROLOL TARTRATE 25 MG PO TABS: 25.0000 mg | ORAL_TABLET | Freq: Two times a day (BID) | ORAL | Status: DC

## 2011-10-21 NOTE — Progress Notes (Signed)
Clinical Social Worker continuing to follow for discharge planning. Clinical Social Worker reviewed chart and noted PT now recommending Home Health PT. Clinical Child psychotherapist discussed with RNCM and RNCM aware of updated recommendation for discharge plan and states that pt reports that he has assistance at home. Per PT evaluation, pt is at supervision level for ambulation's and transfers. Due to pt level of ADL's, pt insurance [employer] will not cover SNF placement at this time. RNCM assisting with home health needs. No further social work needs identified at this time. Clinical Social Worker signing off. Please re-consult if further social work needs arise.   Jacklynn Lewis, MSW, LCSWA (covering) Clinical Social Work [phone]

## 2011-10-21 NOTE — Progress Notes (Signed)
CARDIAC REHAB PHASE I   PRE:  Rate/Rhythm: 91 SR    BP: sitting 114/95    SaO2: 98 RA  MODE:  Ambulation: 350 ft   POST:  Rate/Rhythm: 113 ST    BP: sitting 132/70     SaO2: 99 RA  Fairly steady with RW and assist x2, min assist. Needed VCs for safety with RW (to step around it, keep feet inside it). Did not seem to fatigue. Has progressed well since Friday (with PT). Pt adamant re: D/C today. Reviewed sternal precautions, walking daily and CRPII. Voiced understanding.  [id number]  Harriet Masson CES, ACSM

## 2011-10-21 NOTE — Progress Notes (Signed)
EPWs pulled per protocol, no ectopy or other problem noted at this time.  Instructed on 1hr of bedrest.  Will continue to monitor.

## 2011-10-21 NOTE — Progress Notes (Signed)
Spoke with [name] regarding discharge plans for possibly later this week.  The patient states he will be signing out AMA.  During rounds this morning Dr. Dorris Fetch spoke with patient regarding reasons for hospitalization and risks of signing out AMA and patient wishes to proceed with [facility] paperwork.  His pacing wires are in place and I instructed his nurse to remove wires prior to patients discharge.   Follow up appointments have been placed and our office will contact patient with necessary information

## 2011-10-21 NOTE — Progress Notes (Addendum)
13 Days Post-Op Procedure(s) (LRB): MEDIASTINAL EXPLORATION (N/A)  Subjective: Mr. Silvers states he wants to go home today and is planning on having his sister pick him up around noon.  He has no complaints this morning.   Objective: Vital signs in last 24 hours: Temp:  [97 F (36.1 C)-98.2 F (36.8 C)] 98.2 F (36.8 C) (07/22 0500) Pulse Rate:  [71-75] 75  (07/22 0500) Cardiac Rhythm:  [-] Normal sinus rhythm (07/21 1631) Resp:  [16-19] 18  (07/22 0500) BP: (105-140)/(64-92) 116/76 mmHg (07/22 0500) SpO2:  [95 %-99 %] 96 % (07/22 0500) Weight:  [183 lb 3.2 oz (83.099 kg)] 183 lb 3.2 oz (83.099 kg) (07/22 0500)  Intake/Output from previous day: 07/21 0701 - 07/22 0700 In: 560 [P.O.:360; I.V.:200] Out: 1300 [Urine:1300]  General appearance: alert, cooperative and no distress Heart: regular rate and rhythm and + click Lungs: clear to auscultation bilaterally Abdomen: soft, non-tender; bowel sounds normal; no masses,  no organomegaly Extremities: edema trace Wound: clean and dry, staples and sutures remain in place  Lab Results:  Basename 10/21/11 0500 10/19/11 0400  WBC 13.4* 16.6*  HGB 10.6* 10.5*  HCT 32.7* 32.8*  PLT 270 213   BMET:  Basename 10/21/11 0500 10/19/11 0400  NA 141 144  K 3.7 3.6  CL 109 112  CO2 19 21  GLUCOSE 90 95  BUN 18 21  CREATININE 0.81 0.78  CALCIUM 8.8 8.5    PT/INR:  Basename 10/21/11 0500  LABPROT 20.5*  INR 1.72*   ABG    Component Value Date/Time   PHART 7.507* 10/14/2011 1233   HCO3 30.8* 10/14/2011 1233   TCO2 32 10/14/2011 1233   ACIDBASEDEF 9.0* 10/07/2011 1801   O2SAT 97.0 10/14/2011 1233   CBG (last 3)   Basename 10/19/11 0713 10/19/11 0412 10/18/11 1941  GLUCAP 80 92 122*    Assessment/Plan: S/P Procedure(s) (LRB): MEDIASTINAL EXPLORATION (N/A)  1. CV- rate and pressure controlled on lopressor and amiodarone 2. S/P Mechanical AVR, INR 1.72, will give 2mg  tonight, which was home dose 3. Pulm- no acute issues,  continue IS 4. Volume Overload- patients weight is below admission, minimal LE edema, currently on Lasix 5. Deconditioning- PT recs SNF, patient declining states he will be staying with his daughter 19. Dispo- patient progressing, states is going home today, will discuss with staff and make arrangements   LOS: 14 days    BARRETT, ERIN 10/21/2011   Patient seen and examined. In my opinion he is not ready to be discharged at this time. I told him that in no uncertain terms. I explained the implications of signing out AMA. He needs his pacing wires out. His INR is not yet therapeutic. His mobility is still limited, although it is improving.

## 2011-10-21 NOTE — Progress Notes (Signed)
Occupational Therapy Treatment Patient Details Name: [name] MRN: [id number] DOB: 06-25-1950 Today's Date: 10/21/2011 Time: 1215-     OT Assessment / Plan / Recommendation Comments on Treatment Session pt states he is signing out AMa today and will recover better on his couch and he will follow up with family doctor    Follow Up Recommendations    Barriers to Discharge    Equipment Recommendations  3 in 1 bedside comode             Precautions / Restrictions Precautions Precautions: Fall       ADL  Eating/Feeding: Performed;Set up Grooming: Simulated;Wash/dry face;Minimal assistance Lower Body Dressing: Simulated;Minimal assistance Where Assessed - Lower Body Dressing: Supported sit to stand Toilet Transfer: Mining engineer Method: Sit to stand Toileting - Clothing Manipulation and Hygiene: Simulated;Minimal assistance ADL Comments: Pt overall min A and weak during OT sesson. Pt will benefit from bedside commode as he is signing out AMA today. Explained benefits of stayingin hospital     OT Goals ADL Goals ADL Goal: Grooming - Progress: Progressing toward goals ADL Goal: Lower Body Dressing - Progress: Progressing toward goals ADL Goal: Toilet Transfer - Progress: Progressing toward goals Miscellaneous OT Goals Miscellaneous OT Goal #1: Not able to recall initially- then did with verbal cues OT Goal: Miscellaneous Goal #1 - Progress: Progressing toward goals  Visit Information  Last OT Received On: 10/21/11    Subjective Data  Subjective: i am leaving today. i am a grown man and make my own decisions      Cognition  Overall Cognitive Status: Impaired Area of Impairment: Awareness of deficits    Mobility Bed Mobility Bed Mobility: Sit to Supine Rolling Right: 5: Supervision Transfers Transfers: Sit to Stand;Stand to Sit Sit to Stand: From bed;4: Min assist Stand to Sit: To chair/3-in-1         End of Session OT - End of  Session Activity Tolerance: Patient limited by fatigue Patient left: in chair;with call bell/phone within reach  GO     [facility] [place] [facility] 10/21/2011, 12:31 PM

## 2011-10-21 NOTE — Progress Notes (Signed)
Physical Therapy Treatment Patient Details Name: [name] MRN: [id number] DOB: 17-Dec-1950 Today's Date: 10/21/2011 Time: 1256-1310 PT Time Calculation (min): 14 min  PT Assessment / Plan / Recommendation Comments on Treatment Session  Pt reports discharge home today with sister.      Follow Up Recommendations  Home health PT    Barriers to Discharge        Equipment Recommendations  3 in 1 bedside comode    Recommendations for Other Services    Frequency Min 3X/week   Plan Discharge plan remains appropriate;Frequency remains appropriate    Precautions / Restrictions Precautions Precautions: Fall Restrictions Weight Bearing Restrictions: No   Pertinent Vitals/Pain No c/o pain.     Mobility  Bed Mobility Bed Mobility: Left Sidelying to Sit;Sit to Supine;Rolling Right;Rolling Left Rolling Right: 7: Independent Rolling Left: 7: Independent Right Sidelying to Sit: 6: Modified independent (Device/Increase time) Left Sidelying to Sit: Not tested (comment) Sit to Supine: 6: Modified independent (Device/Increase time) Sit to Sidelying Right: Not Tested (comment) Details for Bed Mobility Assistance: Pt required no assistance or instruction. Requires extra effort and time for task.   Transfers Transfers: Sit to Stand;Stand to Sit Sit to Stand: 4: Min assist;From bed;From chair/3-in-1;With upper extremity assist;4: Min guard Sit to Stand: Patient Percentage: 90% Stand to Sit: 4: Min guard;To chair/3-in-1;To bed;With upper extremity assist Stand Pivot Transfers: 5: Supervision Details for Transfer Assistance: Supervision for safety.  Pt requied min assist to stand from chair secondary to LE weakness. Pt relies heavily on UEs to stand.  Reminded pt of sternal precautions.    Ambulation/Gait Ambulation/Gait Assistance: 5: Supervision Ambulation Distance (Feet): 150 Feet Assistive device: None Ambulation/Gait Assistance Details: No assistive device required.  Pt demonstrates  safety with gait.  Gait Pattern: Step-through pattern;Decreased stride length;Wide base of support Stairs: No Wheelchair Mobility Wheelchair Mobility: No    Exercises     PT Diagnosis:    PT Problem List:   PT Treatment Interventions:     PT Goals Acute Rehab PT Goals PT Goal Formulation: With patient Time For Goal Achievement: 10/31/11 Potential to Achieve Goals: Good Pt will Roll Supine to Right Side: with supervision PT Goal: Rolling Supine to Right Side - Progress: Met Pt will Roll Supine to Left Side: with supervision PT Goal: Rolling Supine to Left Side - Progress: Met Pt will go Supine/Side to Sit: with supervision PT Goal: Supine/Side to Sit - Progress: Met Pt will go Sit to Supine/Side: with supervision PT Goal: Sit to Supine/Side - Progress: Met Pt will go Sit to Stand: with min assist PT Goal: Sit to Stand - Progress: Met Pt will go Stand to Sit: with min assist PT Goal: Stand to Sit - Progress: Met Pt will Transfer Bed to Chair/Chair to Bed: with modified independence PT Transfer Goal: Bed to Chair/Chair to Bed - Progress: Met Pt will Ambulate: >150 feet;with min assist;with rolling walker PT Goal: Ambulate - Progress: Met Pt will Go Up / Down Stairs: 1-2 stairs;with modified independence;with least restrictive assistive device PT Goal: Up/Down Stairs - Progress: Not met Additional Goals Additional Goal #1: pt will follow sternal precautions.   PT Goal: Additional Goal #1 - Progress: Met  Visit Information  Last PT Received On: 10/21/11    Subjective Data  Subjective: I am going home today Patient Stated Goal: return to home.    Cognition  Overall Cognitive Status: Appears within functional limits for tasks assessed/performed Area of Impairment: Awareness of deficits    Balance  Balance Balance Assessed: No  End of Session PT - End of Session Equipment Utilized During Treatment: Gait belt Activity Tolerance: Patient tolerated treatment well Patient  left: in chair;with call bell/phone within reach Nurse Communication: Mobility status   GP     [name] 10/21/2011, 2:26 PM [name] DPT [phone]

## 2011-10-21 NOTE — Discharge Summary (Signed)
Physician Discharge Summary  Patient ID: [name] MRN: [id number] DOB/AGE: 1950/05/24 61 y.o.  Admit date: 10/07/2011 Discharge date: 10/21/2011  Admission Diagnoses: Ruptured pseudoaneurysm of ascending aorta  Patient Active Problem List  Diagnosis  . HYPERLIPIDEMIA-MIXED  . DEPRESSION, MAJOR  . CONSTIPATION  . DYSPNEA  . CHEST PAIN, PLEURITIC  . Mitral regurgitation  . CAD (coronary artery disease)  . Aortic stenosis  . Major depressive disorder  . Fibromyalgia  . S/P AVR  . Encounter for long-term (current) use of anticoagulants  . Atrial fibrillation  . Pericardial effusion   Discharge Diagnoses:   Ruptured pseudoaneurysm of ascending aorta Patient Active Problem List  Diagnosis  . HYPERLIPIDEMIA-MIXED  . DEPRESSION, MAJOR  . CONSTIPATION  . DYSPNEA  . CHEST PAIN, PLEURITIC  . Mitral regurgitation  . CAD (coronary artery disease)  . Aortic stenosis  . Major depressive disorder  . Fibromyalgia  . S/P AVR  . Encounter for long-term (current) use of anticoagulants  . Atrial fibrillation  . Pericardial effusion   Discharged Condition: good  History of Present Illness:  [name] is a 61 yo male well known to [facility].  He is S/P AVR performed in May of this year.  The patient presented to [facility] Department on 10/07/2011 with a complaint of syncope.  The patient stated he had been feeling poorly for approximately 4 days.  He states he had felt very tire and weak until he eventually passed out the morning of admission.  In the ED the patient underwent CT chest to rule out pulmonary embolism.  However, he was found to have a contained rupture of an ascending aortic pseudoaneurysm.  The patient was hypotensive and in shock.  He was emergently transferred to [facility] for emergency surgery.  Hospital Course:   Upon arrival he was taken emergently to the operating room.  He underwent resection and grafting of ascending aorta for repair of contained  ruptured ascending aortic pseudoaneurysm with a 28mm Hemashield graft. The patient remained in stable, but critical condition at that end of the procedure and was transferred to the SICU with an open chest covered with Esmarch dressing.  The patient has had a complicated hospital stay.  He has received multiple transfusion of blood products.  POD #1 the patients cardiac enzymes were significantly elevated.  He was requiring multiple inotropic agents to sustain cardiac output.  He remained intubated.  He was taken to the operating room for sternal washout and chest closure.  POD #2  Patient remains intubated on Milrinone and Dopamine.  Patient with nonoliguric ATN, with improving creatinine.  POD #3 patients milrinone and dopamine were weaned as tolerated.  Patient remained intubated.  Patient underwent Panda placement for tube feeds.  POD #4 patient remains intubated.  He remained on inotrope's for additional support.  POD #5 patient requiring atrial pacing to help maximize cardiac output.  He continues to require milrinone and dopamine.  Remains intubated.  POD #6 patient was weaned off milrinone. The patient pulled out his Panda tube overnight. The patient was extubated.  His atrial pacing was discontinued.  POD #7 patient maintaining NSR.  He was restarted on Coumadin for mechanical valve.  He was weaned off dopamine. Patient underwent swallow study to rule out aspiration.  POD #8 patients blood pressure improved, he was restarted on home dose of Lopressor.  Patient was tolerating regular diet.  POD #9  Patient slowly progressing.  PT consult obtained to assess deconditioning and recommended inpatient  rehab at discharge.  POD #10 patients INR was supra therapeutic, and coumadin was held.  POD #11 patient remains severely weak and deconditioned.  Coumadin was held for increased INR.  POD #12 patient slowly progressing.  He medically stable and was transferred to the step down unit.  POD #13  Patient continues to  make slow progress.  His INR is sub therapeutic and he continues to have weakness and limited mobility.  His surgical incisions are healing, but there are some staples and sutures in place that will need to be removed in one weeks times.  His pacing wires also remained in place and I instructed the nurse to remove them prior to patients discharge.  The patient wishes to be discharged home today.  We do not feel the patient is appropriate for discharge at this time, but the patient wishes to sign out AMA.  The patient was educated on the risks of him be discharged prior to medical recommendations and he was willing to take the risks.  He was provided with [facility] paperwork.  Post operative appointments have been placed and our office will contact the patient with follow up appointments.     Treatments: surgery:   Contained ruptured pseudoaneurysm of ascending  aorta with shock.   POSTOPERATIVE DIAGNOSIS: Contained ruptured pseudoaneurysm of ascending  aorta with shock.   PROCEDURE: Emergency resection and grafting of ascending aorta for repair of a contained ruptured ascending aortic pseudoaneurysm with a 28- mm Hemashield graft via redo median sternotomy.    Open chest status post repair of ruptured  aortic pseudoaneurysm.   POSTOPERATIVE DIAGNOSIS: Open chest status post repair of ruptured aortic pseudoaneurysm.   PROCEDURE: Reexploration of mediastinum and sternal closure.  Disposition: 01-Home or Self Care  Discharge Orders    Future Appointments: Provider: Department: Dept Phone: Center:   11/13/2011 2:15 PM June Leap, MD [facility] [phone] [facility]     Medication List  As of 10/21/2011  9:00 AM   TAKE these medications         ALPRAZolam 1 MG tablet   Commonly known as: XANAX   Take 1 mg by mouth 3 (three) times daily as needed.      amiodarone 200 MG tablet   Commonly known as: PACERONE   Take 1 tablet (200 mg total) by mouth 2 (two) times daily.       amoxicillin-clavulanate 875-125 MG per tablet   Commonly known as: AUGMENTIN   Take 1 tablet by mouth 2 (two) times daily.      aspirin EC 81 MG tablet   Take 81 mg by mouth daily.      HYDROcodone-acetaminophen 5-325 MG per tablet   Commonly known as: NORCO/VICODIN   Take 1 tablet by mouth Three times a day.      metoprolol tartrate 25 MG tablet   Commonly known as: LOPRESSOR   Take 1 tablet (25 mg total) by mouth 2 (two) times daily.      pantoprazole 40 MG tablet   Commonly known as: PROTONIX   Take 40 mg by mouth daily.      promethazine 25 MG tablet   Commonly known as: PHENERGAN   Take 25 mg by mouth every 6 (six) hours as needed. For nausea      simvastatin 40 MG tablet   Commonly known as: ZOCOR   Take 40 mg by mouth at bedtime.      venlafaxine XR 150 MG 24 hr capsule   Commonly  known as: EFFEXOR-XR   Take 150 mg by mouth 2 (two) times daily.      warfarin 2 MG tablet   Commonly known as: COUMADIN   Take 2-3 mg by mouth daily. Take 2 MG every day except for Mondays; on Mondays, take 3 MG           Follow-up Information    Follow up with HENDRICKSON,STEVEN C, MD in 3 weeks. (Office will contact you with appointment)    Contact information:   [address] [place] [address] [phone]       Follow up with [facility] imaging in 3 weeks. (please get chest xray one hour prior to your appointment with Dr. Dorris Fetch)    Contact information:   [address], [address]      Follow up with [facility] nurse in 1 week. (Office will contact you for suture removal)       Follow up with Earnestine Leys. (Please follow up with provider monitoring coumadin, have PT/INR checked  on Wednesday)          Signed: BARRETT, ERIN 10/21/2011, 9:00 AM

## 2011-10-22 MED ORDER — WARFARIN SODIUM 2 MG PO TABS
2.0000 mg | ORAL_TABLET | Freq: Once | ORAL | Status: AC
  Administered 2011-10-22: 2 mg via ORAL
  Filled 2011-10-22: qty 1

## 2011-10-22 MED ORDER — SODIUM CHLORIDE 0.9 % IJ SOLN
10.0000 mL | INTRAMUSCULAR | Status: DC | PRN
  Administered 2011-10-22: 10 mL
  Administered 2011-10-23: 20 mL

## 2011-10-22 MED FILL — Venlafaxine HCl Cap ER 24HR 150 MG (Base Equivalent): ORAL | Qty: 1 | Status: AC

## 2011-10-22 MED FILL — Sodium Chloride Flush IV Soln 0.9%: INTRAVENOUS | Qty: 3 | Status: AC

## 2011-10-22 MED FILL — Alprazolam Tab 1 MG: ORAL | Qty: 1 | Status: AC

## 2011-10-22 MED FILL — Metoprolol Tartrate Tab 25 MG: ORAL | Qty: 1 | Status: AC

## 2011-10-22 MED FILL — Amino Acids-Protein Hydrolysate Liquid: ORAL | Qty: 30 | Status: AC

## 2011-10-22 MED FILL — Amiodarone HCl Tab 200 MG: ORAL | Qty: 1 | Status: AC

## 2011-10-22 MED FILL — Warfarin Sodium Tab 1 MG: ORAL | Qty: 1 | Status: AC

## 2011-10-22 NOTE — Progress Notes (Addendum)
14 Days Post-Op Procedure(s) (LRB): MEDIASTINAL EXPLORATION (N/A) Subjective:  [name] has no complaints this morning.  His family was able to talk him out of signing out AMA yesterday.   Objective: Vital signs in last 24 hours: Temp:  [97.3 F (36.3 C)-98 F (36.7 C)] 97.9 F (36.6 C) (07/23 0535) Pulse Rate:  [73-77] 74  (07/23 0535) Cardiac Rhythm:  [-] Normal sinus rhythm (07/22 2001) Resp:  [16-20] 16  (07/23 0535) BP: (108-126)/(77-84) 123/78 mmHg (07/23 0535) SpO2:  [96 %-100 %] 96 % (07/23 0535) Weight:  [179 lb 0.2 oz (81.2 kg)] 179 lb 0.2 oz (81.2 kg) (07/23 0535)  Intake/Output from previous day: 07/22 0701 - 07/23 0700 In: 720 [P.O.:720] Out: 1150 [Urine:1150]  General appearance: alert, cooperative and no distress Heart: regular rate and rhythm Lungs: clear to auscultation bilaterally Abdomen: soft, non-tender; bowel sounds normal; no masses,  no organomegaly Extremities: edema trace Wound: clean and dry  Lab Results:  Basename 10/21/11 0500  WBC 13.4*  HGB 10.6*  HCT 32.7*  PLT 270   BMET:  Basename 10/21/11 0500  NA 141  K 3.7  CL 109  CO2 19  GLUCOSE 90  BUN 18  CREATININE 0.81  CALCIUM 8.8    PT/INR:  Basename 10/22/11 0529  LABPROT 20.3*  INR 1.70*   ABG    Component Value Date/Time   PHART 7.507* 10/14/2011 1233   HCO3 30.8* 10/14/2011 1233   TCO2 32 10/14/2011 1233   ACIDBASEDEF 9.0* 10/07/2011 1801   O2SAT 97.0 10/14/2011 1233   CBG (last 3)  No results found for this basename: GLUCAP:3 in the last 72 hours  Assessment/Plan: S/P Procedure(s) (LRB): MEDIASTINAL EXPLORATION (N/A)  1. CV- NSR, rate and pressure controlled on lopressor and amiodarone 2. S/P Mechanical AVR, INR 1.70 will give 2mg  coumadin tonight which was home dose 3. Pulm- no acute issues, continue IS 4. Volume Overload- patients weight is stable 5. Deconditioning- PT now recs home PT, insurance would not cover SNF 6. Dispo- patient progressing, will need INR to  be therapeutic, patients strength improving will continue PT, if no issues arise can hopefully aim for d/c by Friday  LOS: 15 days    Wright, Allen 10/22/2011   In a much better state of mind this AM.  Will plan to d/c tomorrow with HHPT  2 mg coumadin today. Check INR in AM  Will need INR checked again on Friday  Will d/c remaining staples and sutures prior to d/c

## 2011-10-22 NOTE — Progress Notes (Signed)
Occupational Therapy Treatment Patient Details Name: [name] MRN: [id number] DOB: 12/23/1950 Today's Date: 10/22/2011 Time: 6295-2841 OT Time Calculation (min): 20 min  OT Assessment / Plan / Recommendation Comments on Treatment Session Pt much steadier today with OT. Pt states daugther will assist at DC    Follow Up Recommendations  Home health OT       Equipment Recommendations  3 in 1 bedside comode          Plan Discharge plan needs to be updated    Precautions / Restrictions Restrictions Weight Bearing Restrictions: No       ADL  Grooming: Performed;Brushing hair;Wash/dry face Where Assessed - Grooming: Supported standing;Other (comment) (standing at the sink) Lower Body Dressing: Performed;Minimal assistance Where Assessed - Lower Body Dressing: Unsupported sit to stand Toilet Transfer: Minimal assistance;Performed Toilet Transfer Method: Sit to stand Toilet Transfer Equipment: Comfort height toilet;Grab bars Toileting - Clothing Manipulation and Hygiene: Min guard;Performed Where Assessed - Toileting Clothing Manipulation and Hygiene: Standing Transfers/Ambulation Related to ADLs: Pt much steadier today!  Pt adhered to sternal precautions during sit to stand.        OT Goals ADL Goals ADL Goal: Grooming - Progress: Progressing toward goals ADL Goal: Lower Body Bathing - Progress: Progressing toward goals ADL Goal: Lower Body Dressing - Progress: Progressing toward goals ADL Goal: Toilet Transfer - Progress: Progressing toward goals ADL Goal: Toileting - Clothing Manipulation - Progress: Progressing toward goals ADL Goal: Toileting - Hygiene - Progress: Progressing toward goals  Visit Information  Last OT Received On: 10/22/11    Subjective Data  Subjective: I did decided to stay.  My sister talked some sense into me.      Cognition  Overall Cognitive Status: Appears within functional limits for tasks assessed/performed Awareness of Deficits: increased  awareness of deficits today and safety awareness    Mobility Transfers Transfers: Sit to Stand;Stand to Sit Sit to Stand: 5: Supervision;From bed;Without upper extremity assist Stand to Sit: 5: Supervision;To chair/3-in-1;Without upper extremity assist         End of Session OT - End of Session Activity Tolerance: Patient tolerated treatment well Patient left: in chair;with call bell/phone within reach;with chair alarm set       Alba Cory 10/22/2011, 10:49 AM

## 2011-10-22 NOTE — Progress Notes (Signed)
CARDIAC REHAB PHASE I   PRE:  Rate/Rhythm: 93SR  BP:  Supine:   Sitting:   Standing:    SaO2:  To bathroom and then walked  MODE:  Ambulation: 350 ft   POST:  Rate/Rhythem: 114 ST  BP:  Supine: 121/65  Sitting:   Standing:    SaO2: 99%RA 1207-1240 Assisted pt to bathroom and then he walked 350 ft with handheld asst. Stopped several times to rest. Stated bathroom trip made him tired. Pt wanted to try without walker and just hand held. Steadier with walker. Pt states he would like rolling walker for home use.  To bed after walk.  Duanne Limerick

## 2011-10-23 MED ORDER — WARFARIN SODIUM 2 MG PO TABS: 2.0000 mg | ORAL_TABLET | Freq: Every day | ORAL | Status: DC

## 2011-10-23 MED ORDER — WARFARIN SODIUM 4 MG PO TABS
4.0000 mg | ORAL_TABLET | Freq: Once | ORAL | Status: AC
  Administered 2011-10-23: 4 mg via ORAL
  Filled 2011-10-23: qty 1

## 2011-10-23 NOTE — Progress Notes (Signed)
CARDIAC REHAB PHASE I   PRE:  Rate/Rhythm: 81SR  BP:  Supine: 100/65  Sitting:   Standing:    SaO2:   MODE:  Ambulation: 550 ft   POST:  Rate/Rhythem: 100SR  BP:  Supine:   Sitting: 115/68  Standing:    SaO2: 100%RA 1030-1115 Reviewed ed with pt. Pt would like rolling walker for home use. Notified case Production designer, theatre/television/film. Pt walked 550 ft with rolling walker with steady gait. Pt repeated back sternal precautions and we reviewed some better eating habits. To recliner after walk.  Duanne Limerick

## 2011-10-23 NOTE — Progress Notes (Signed)
15 Days Post-Op Procedure(s) (LRB): MEDIASTINAL EXPLORATION (N/A) Subjective: No complaints this AM  Objective: Vital signs in last 24 hours: Temp:  [97.8 F (36.6 Wright)-98.4 F (36.9 Wright)] 97.8 F (36.6 Wright) (07/24 0407) Pulse Rate:  [72-79] 72  (07/24 0407) Cardiac Rhythm:  [-] Normal sinus rhythm (07/23 2033) Resp:  [16-19] 18  (07/24 0407) BP: (113-137)/(75-81) 113/78 mmHg (07/24 0407) SpO2:  [95 %-97 %] 95 % (07/24 0407) Weight:  [178 lb 1.6 oz (80.786 kg)] 178 lb 1.6 oz (80.786 kg) (07/24 0407)  Hemodynamic parameters for last 24 hours:    Intake/Output from previous day: 07/23 0701 - 07/24 0700 In: 720 [P.O.:720] Out: 1450 [Urine:1450] Intake/Output this shift: Total I/O In: 480 [P.O.:480] Out: -   Heart: regular rate and rhythm Lungs: clear to auscultation bilaterally Wound: clean and dry  Lab Results:  Basename 10/21/11 0500  WBC 13.4*  HGB 10.6*  HCT 32.7*  PLT 270   BMET:  Basename 10/21/11 0500  NA 141  K 3.7  CL 109  CO2 19  GLUCOSE 90  BUN 18  CREATININE 0.81  CALCIUM 8.8    PT/INR:  Basename 10/23/11 0517  LABPROT 18.2*  INR 1.48   ABG    Component Value Date/Time   PHART 7.507* 10/14/2011 1233   HCO3 30.8* 10/14/2011 1233   TCO2 32 10/14/2011 1233   ACIDBASEDEF 9.0* 10/07/2011 1801   O2SAT 97.0 10/14/2011 1233   CBG (last 3)  No results found for this basename: GLUCAP:3 in the last 72 hours  Assessment/Plan: S/P Procedure(s) (LRB): MEDIASTINAL EXPLORATION (N/A) Plan for discharge: see discharge orders INR down this AM- he overshot with 5 mg and 2 mg is not adequate. He was on 2 mg 6 days a week and 3 mg on Mondays PTA.  Will give 4 mg today, then alternate 2 mg with 4 mg. Recheck INR on Friday with result to [facility] [facility].  Will arrange home health RN and home PT  Needs follow up appointment in office next week for staple removal, then follow up in 3 weeks for his postop visit.  Also needs follow up with Dr. Margarita Mail  office    LOS: 16 days    [name] 10/23/2011

## 2011-10-23 NOTE — Progress Notes (Signed)
Per RNCM patient's family is requesting that we pursue SNF insurance auth as they do not feel he will have adequate assistance at home. I have submitted clinical information to [employer] and have left 2 messages for her today for follow up. Will fax the [facility] out for bed offers in case patient is approved. Will update and advise in the morning on progress with these efforts.  Reece Levy, MSW, Theresia Majors [phone]

## 2011-10-23 NOTE — Progress Notes (Signed)
Spoke with pt's daughter, Dimas Chyle, to discuss choice for Home Health agency.  (cell [phone])  Daughter upset, states family was told by MD that pt would be in the hospital for another week, and that she is not prepared to take him home today.  Pt had stated that daughter and other family members would provide 24hr care at discharge.  She states this is not true, that she works 10 hr days, and that she feels pt needs SNF for rehab.  I explained that pt likely has progressed to a level that insurance may not pay for SNF, and she states that she wants this possibility explored.  Will defer to CSW to investigate possible SNF and insurance authorization.  Daughter states she feels pt is not in his "right mind" since surgery.

## 2011-10-23 NOTE — Progress Notes (Signed)
Physical Therapy Treatment Patient Details Name: [name] MRN: [id number] DOB: [birth year] Today's Date: 10/23/2011 Time: 1445-1500 PT Time Calculation (min): 15 min  PT Assessment / Plan / Recommendation Comments on Treatment Session       Follow Up Recommendations  Home health PT    Barriers to Discharge        Equipment Recommendations  3 in 1 bedside comode    Recommendations for Other Services    Frequency     Plan Discharge plan remains appropriate;All goals met and education completed, patient dischaged from PT services    Precautions / Restrictions Precautions Precautions: Fall Restrictions Weight Bearing Restrictions: No   Pertinent Vitals/Pain No c/o pain    Mobility  Bed Mobility Bed Mobility: Not assessed Transfers Transfers: Sit to Stand;Stand to Sit Sit to Stand: 6: Modified independent (Device/Increase time);From bed Stand to Sit: 7: Independent Ambulation/Gait Ambulation/Gait Assistance: 5: Supervision Ambulation Distance (Feet): 100 Feet Assistive device: None Ambulation/Gait Assistance Details: Pt a little unsteady ambulating with no AD.  Pt reports feeling safer with RW.   Gait Pattern: Step-through pattern;Decreased stride length;Wide base of support    Exercises     PT Diagnosis:    PT Problem List:   PT Treatment Interventions:     PT Goals Acute Rehab PT Goals PT Goal Formulation: With patient Time For Goal Achievement: 10/31/11 Potential to Achieve Goals: Good Pt will Go Up / Down Stairs: 1-2 stairs;with modified independence;with least restrictive assistive device PT Goal: Up/Down Stairs - Progress: Met  Visit Information  Last PT Received On: 10/23/11    Subjective Data  Subjective: I need a walker Patient Stated Goal: return to home.    Cognition  Overall Cognitive Status: Appears within functional limits for tasks assessed/performed Arousal/Alertness: Awake/alert Orientation Level: Appears intact for tasks  assessed Behavior During Session: [facility] for tasks performed    Balance     End of Session PT - End of Session Equipment Utilized During Treatment: Gait belt Activity Tolerance: Patient tolerated treatment well Patient left: in bed;with call bell/phone within reach Nurse Communication: Mobility status   GP     [name] 10/23/2011, 4:58 PM Jolie Strohecker L. Deshante Cassell DPT [phone]

## 2011-10-23 NOTE — Progress Notes (Signed)
Nutrition Follow-up  Intervention:    Continue Ensure Complete & Prostat liquid protein twice daily  RD to follow for nutrition care plan  Assessment:   Patient's PO intake variable at 25-100% per flowsheet records. Receiving Ensure & Prostat liquid protein supplements ordered. Medically stable for discharge.  Diet Order:  Carbohydrate Modified Medium Calorie  Meds: Scheduled Meds:   . ALPRAZolam  1 mg Oral TID  . amiodarone  200 mg Oral BID  . bisacodyl  10 mg Oral Daily   Or  . bisacodyl  10 mg Rectal Daily  . docusate sodium  200 mg Oral Daily  . feeding supplement  237 mL Oral BID BM  . feeding supplement  30 mL Oral BID WC  . metoprolol tartrate  25 mg Oral BID  . moving right along book   Does not apply Once  . pantoprazole  40 mg Oral Q1200  . sodium chloride  3 mL Intravenous Q12H  . venlafaxine XR  150 mg Oral BID  . warfarin  2 mg Oral ONCE-1800  . warfarin  4 mg Oral ONCE-1800  . Warfarin - Physician Dosing Inpatient   Does not apply q1800   Continuous Infusions:  PRN Meds:.sodium chloride, acetaminophen, alum & mag hydroxide-simeth, diphenhydrAMINE, guaiFENesin-dextromethorphan, magnesium hydroxide, ondansetron (ZOFRAN) IV, sodium chloride, sodium chloride, traMADol, traMADol  Labs:  CMP     Component Value Date/Time   NA 141 10/21/2011 0500   K 3.7 10/21/2011 0500   CL 109 10/21/2011 0500   CO2 19 10/21/2011 0500   GLUCOSE 90 10/21/2011 0500   BUN 18 10/21/2011 0500   CREATININE 0.81 10/21/2011 0500   CALCIUM 8.8 10/21/2011 0500   PROT 6.8 10/16/2011 0410   ALBUMIN 3.1* 10/16/2011 0410   AST 61* 10/16/2011 0410   ALT 185* 10/16/2011 0410   ALKPHOS 156* 10/16/2011 0410   BILITOT 1.2 10/16/2011 0410   GFRNONAA >90 10/21/2011 0500   GFRAA >90 10/21/2011 0500     Intake/Output Summary (Last 24 hours) at 10/23/11 1224 Last data filed at 10/23/11 0730  Gross per 24 hour  Intake    480 ml  Output    400 ml  Net     80 ml    Weight Status:  80.7 kg (7/24) -- down  due to fluid loss  Re-estimated needs: 2100-2300 kcals, 120-130 gm protein   Nutrition Dx: Inadequate Oral Intake, ongoing   Goal: Oral intake with meals & supplements to meet >/= 90% of estimated nutrition needs, progressing  Monitor: PO intake, weight, labs, I/O's  Kirkland Hun, RD, LDN Pager #: [phone] After-Hours Pager #: [phone]

## 2011-10-23 NOTE — Progress Notes (Signed)
Right arm PICC Line dc'd per order. Line intact at 42cm. Manual pressure held x 5 minutes. No active bleeding noted. Vaseline gauze pressure dressing applied and secured. Pt. Teaching done and understanding verbalized.

## 2011-10-23 NOTE — Discharge Summary (Signed)
                   [address]            [name] [address]          [phone]      Adddendum:  [name] continues to slowly improve. He will have home health arranged for physical therapy and nursing. He is felt to be stable for discharge at this time.His currnet medications ar as follows:  Medication List  As of 10/23/2011  8:52 AM   TAKE these medications         ALPRAZolam 1 MG tablet   Commonly known as: XANAX   Take 1 mg by mouth 3 (three) times daily as needed.      amiodarone 200 MG tablet   Commonly known as: PACERONE   Take 1 tablet (200 mg total) by mouth 2 (two) times daily.      amoxicillin-clavulanate 875-125 MG per tablet   Commonly known as: AUGMENTIN   Take 1 tablet by mouth 2 (two) times daily.      aspirin EC 81 MG tablet   Take 81 mg by mouth daily.      HYDROcodone-acetaminophen 5-325 MG per tablet   Commonly known as: NORCO/VICODIN   Take 1 tablet by mouth Three times a day.      metoprolol tartrate 25 MG tablet   Commonly known as: LOPRESSOR   Take 1 tablet (25 mg total) by mouth 2 (two) times daily.      pantoprazole 40 MG tablet   Commonly known as: PROTONIX   Take 40 mg by mouth daily.      promethazine 25 MG tablet   Commonly known as: PHENERGAN   Take 25 mg by mouth every 6 (six) hours as needed. For nausea      simvastatin 40 MG tablet   Commonly known as: ZOCOR   Take 40 mg by mouth at bedtime.      venlafaxine XR 150 MG 24 hr capsule   Commonly known as: EFFEXOR-XR   Take 150 mg by mouth 2 (two) times daily.      warfarin 2 MG tablet   Commonly known as: COUMADIN   Take 1-2 tablets (2-4 mg total) by mouth daily. Take 2 MG alternating daily with 4mg                Pleasee see previously dictated summary for full details of this hospitalization.

## 2011-10-24 LAB — POCT I-STAT 7, (LYTES, BLD GAS, ICA,H+H)
Acid-Base Excess: 5 mmol/L — ABNORMAL HIGH (ref 0.0–2.0)
Hemoglobin: 7.5 g/dL — ABNORMAL LOW (ref 13.0–17.0)
Hemoglobin: 8.8 g/dL — ABNORMAL LOW (ref 13.0–17.0)
Potassium: 3.7 mEq/L (ref 3.5–5.1)
Sodium: 140 mEq/L (ref 135–145)
TCO2: 31 mmol/L (ref 0–100)
pCO2 arterial: 33.3 mmHg — ABNORMAL LOW (ref 35.0–45.0)
pH, Arterial: 7.57 — ABNORMAL HIGH (ref 7.350–7.450)
pO2, Arterial: 53 mmHg — ABNORMAL LOW (ref 80.0–100.0)

## 2011-10-24 LAB — POCT I-STAT 4, (NA,K, GLUC, HGB,HCT)
Glucose, Bld: 119 mg/dL — ABNORMAL HIGH (ref 70–99)
HCT: 21 % — ABNORMAL LOW (ref 39.0–52.0)
Potassium: 3.8 mEq/L (ref 3.5–5.1)

## 2011-10-26 ENCOUNTER — Telehealth: Payer: Self-pay | Admitting: Physician Assistant

## 2011-10-26 NOTE — Telephone Encounter (Signed)
Called by [facility] regarding lab draw today on Mr. Avetisyan. He is on Coumadin for mechanical AVR and atrial fibrillation with a goal INR 2.0-2.5. INR today was 3.1. He received Coumadin management through [facility]. Advised to hold Coumadin this weekend and recheck PT/INR on Monday with results called to the office. States patient has not been experiencing any active bleeding. Understood and agreed. Further recommendations will be determined at that time.    Jacqulyn Bath, PA-C 10/26/2011 12:24 PM

## 2011-10-28 ENCOUNTER — Other Ambulatory Visit: Payer: Self-pay | Admitting: Physician Assistant

## 2011-10-28 ENCOUNTER — Ambulatory Visit: Payer: Self-pay | Admitting: Pharmacist

## 2011-10-28 DIAGNOSIS — Z952 Presence of prosthetic heart valve: Secondary | ICD-10-CM

## 2011-10-28 DIAGNOSIS — Z7901 Long term (current) use of anticoagulants: Secondary | ICD-10-CM

## 2011-10-29 ENCOUNTER — Ambulatory Visit (INDEPENDENT_AMBULATORY_CARE_PROVIDER_SITE_OTHER): Payer: Self-pay

## 2011-10-29 DIAGNOSIS — D381 Neoplasm of uncertain behavior of trachea, bronchus and lung: Secondary | ICD-10-CM

## 2011-10-29 DIAGNOSIS — Z4802 Encounter for removal of sutures: Secondary | ICD-10-CM

## 2011-10-29 DIAGNOSIS — G8918 Other acute postprocedural pain: Secondary | ICD-10-CM

## 2011-10-29 MED ORDER — HYDROCODONE-ACETAMINOPHEN 5-325 MG PO TABS: 1.0000 | ORAL_TABLET | Freq: Four times a day (QID) | ORAL | Status: AC | PRN

## 2011-10-29 NOTE — Progress Notes (Signed)
Removed 4 chest tube sutures, removed 3 sutures from sternal incision and 18 staples from sternal incision. No signs of infection and pt tolerated well. Call RX for pain medication to pharm. Norco 5/325 mg 1 tab po every 4-6 hours prn/pain #40/ no refill.

## 2011-11-04 ENCOUNTER — Other Ambulatory Visit: Payer: Self-pay | Admitting: Thoracic Surgery (Cardiothoracic Vascular Surgery)

## 2011-11-04 ENCOUNTER — Ambulatory Visit: Payer: Self-pay | Admitting: Cardiology

## 2011-11-04 DIAGNOSIS — D381 Neoplasm of uncertain behavior of trachea, bronchus and lung: Secondary | ICD-10-CM

## 2011-11-04 DIAGNOSIS — Z7901 Long term (current) use of anticoagulants: Secondary | ICD-10-CM

## 2011-11-04 DIAGNOSIS — Z952 Presence of prosthetic heart valve: Secondary | ICD-10-CM

## 2011-11-05 ENCOUNTER — Ambulatory Visit (INDEPENDENT_AMBULATORY_CARE_PROVIDER_SITE_OTHER): Payer: PRIVATE HEALTH INSURANCE | Admitting: *Deleted

## 2011-11-05 DIAGNOSIS — Z7901 Long term (current) use of anticoagulants: Secondary | ICD-10-CM

## 2011-11-05 DIAGNOSIS — Z954 Presence of other heart-valve replacement: Secondary | ICD-10-CM

## 2011-11-05 DIAGNOSIS — Z952 Presence of prosthetic heart valve: Secondary | ICD-10-CM

## 2011-11-07 ENCOUNTER — Ambulatory Visit (INDEPENDENT_AMBULATORY_CARE_PROVIDER_SITE_OTHER): Payer: PRIVATE HEALTH INSURANCE | Admitting: *Deleted

## 2011-11-07 DIAGNOSIS — Z952 Presence of prosthetic heart valve: Secondary | ICD-10-CM

## 2011-11-07 DIAGNOSIS — Z7901 Long term (current) use of anticoagulants: Secondary | ICD-10-CM

## 2011-11-07 DIAGNOSIS — Z954 Presence of other heart-valve replacement: Secondary | ICD-10-CM

## 2011-11-07 LAB — POCT INR: INR: 2.7

## 2011-11-11 ENCOUNTER — Other Ambulatory Visit: Payer: Self-pay | Admitting: *Deleted

## 2011-11-11 ENCOUNTER — Ambulatory Visit (INDEPENDENT_AMBULATORY_CARE_PROVIDER_SITE_OTHER): Payer: Self-pay | Admitting: Surgical

## 2011-11-11 ENCOUNTER — Ambulatory Visit (INDEPENDENT_AMBULATORY_CARE_PROVIDER_SITE_OTHER): Payer: PRIVATE HEALTH INSURANCE | Admitting: *Deleted

## 2011-11-11 ENCOUNTER — Ambulatory Visit
Admission: RE | Admit: 2011-11-11 | Discharge: 2011-11-11 | Disposition: A | Discharge status: Home / Self Care | Payer: Medicare Other | Source: Ambulatory Visit | Attending: Thoracic Surgery (Cardiothoracic Vascular Surgery) | Admitting: Thoracic Surgery (Cardiothoracic Vascular Surgery)

## 2011-11-11 VITALS — Ht 72.0 in | Wt 182.0 lb

## 2011-11-11 DIAGNOSIS — Z954 Presence of other heart-valve replacement: Secondary | ICD-10-CM

## 2011-11-11 DIAGNOSIS — Z9889 Other specified postprocedural states: Secondary | ICD-10-CM

## 2011-11-11 DIAGNOSIS — D381 Neoplasm of uncertain behavior of trachea, bronchus and lung: Secondary | ICD-10-CM

## 2011-11-11 DIAGNOSIS — G8918 Other acute postprocedural pain: Secondary | ICD-10-CM

## 2011-11-11 DIAGNOSIS — I359 Nonrheumatic aortic valve disorder, unspecified: Secondary | ICD-10-CM

## 2011-11-11 DIAGNOSIS — I719 Aortic aneurysm of unspecified site, without rupture: Secondary | ICD-10-CM

## 2011-11-11 DIAGNOSIS — I350 Nonrheumatic aortic (valve) stenosis: Secondary | ICD-10-CM

## 2011-11-11 DIAGNOSIS — Z952 Presence of prosthetic heart valve: Secondary | ICD-10-CM

## 2011-11-11 DIAGNOSIS — Z7901 Long term (current) use of anticoagulants: Secondary | ICD-10-CM

## 2011-11-11 LAB — POCT INR: INR: 1.4

## 2011-11-11 MED ORDER — HYDROCODONE-ACETAMINOPHEN 5-500 MG PO TABS: ORAL_TABLET | ORAL | Status: DC

## 2011-11-11 NOTE — Patient Instructions (Signed)
Patient was given verbal instructions regarding driving, lifting restrictions, and increasing activity progression. He understands and agrees.

## 2011-11-11 NOTE — Progress Notes (Signed)
PCP is Donzetta Sprung, MD Referring Provider is Donzetta Sprung, MD  Chief Complaint  Patient presents with  . Routine Post Op    F/U from surgery with CXR, S/P Ascending aorta repair on 10/07/11     HPI: The patient is seen today in routine office visit followup. He continues to do quite well. He is not having any shortness of breath. He has some sternal incision discomfort and is taking one hydrocodone each evening to assist him with sleeping. He continued to improve in regard to his ambulation and increasing activities. He has had home physical therapy and he reports today was his last visit. He denies fevers, chills or other constitutional symptoms. Overall, he feels as though he is doing quite well.   Past Medical History  Diagnosis Date  . Murmur     Trace mitral regurgitation by echocardiogram February 2012  . Hyperlipidemia     takes Simvastatin nightly  . Mitral regurgitation     New murmur at the apex consistent with mitral regurgitation  . Aortic stenosis     Bicuspid aortic valve with mild-to-moderate aortic stenosis echocardiogram February 2012 mean gradient 34 mmHg, aortic valve area 0.7 cm, peak velocity 2.43 m/scomment by catheterization mean gradient 18 mmHg December 2010 status post TEE  . CAD (coronary artery disease)     native vessel, nonobstructive, by catheterization, last catheterization December 2010  . Dyslipidemia   . Fibromyalgia   . Peripheral vascular disease   . Myocardial infarction 10 yrs ago  . Shortness of breath     sitting/lying/exertion  . Bronchitis     hx of-61yrs ago  . Ringing in ears     since seizure 77yrs ago-he hit his head  . Seizures     6 yrs ago from withdrawal of xanax to quickly.  . Dizziness   . Confusion   . Short-term memory loss   . Long-term memory loss   . Stroke     behind right eye-occ sees double 12 yrs ago  . Arthritis   . Joint pain   . Pain and swelling of forearm   . Chronic back pain     lumbar spondylosis  .  Dry skin   . GERD (gastroesophageal reflux disease)     takes Protonix daily  . Constipation     takes Miralax prn  . Hx of colonic polyps   . Slow urinary stream   . Nocturia   . Burning with urination   . Major depressive disorder     wih prior suicidal attempts;takes Effexor bid  . Anxiety     takes Xanax 5 times day  . Aortic aneurysm, including pseudoaneurysm     ascending aorta- repaired    Past Surgical History  Procedure Date  . Inguinal exploration     right  . Femoral hernia repair     x2  . Umbilical hernia repair     [facility]  . Finger contracture release     Tendon release operation on his right little finger  . Appendectomy   . Cholecystectomy     [facility]  . Incisional hernia repair 05/01/2011    Procedure: HERNIA REPAIR INCISIONAL;  Surgeon: Dalia Heading, MD;  Location: [facility]S;  Service: General;  Laterality: N/A;  with Mesh  . Tonsillectomy     at age 89  . Circumcision   . Cardiac catheterization   . Colonoscopy   . Esophagogastroduodenoscopy 04/07/08/13  . Aortic valve replacement 08/15/2011  Procedure: AORTIC VALVE REPLACEMENT (AVR);  Surgeon: Loreli Slot, MD;  Location: [facility] OR;  Service: Open Heart Surgery;  Laterality: N/A;  . Sternotomy 10/07/2011    Procedure: STERNOTOMY;  Surgeon: Loreli Slot, MD;  Location: [facility] OR;  Service: Open Heart Surgery;  Laterality: N/A;  . Thoracic aortic aneurysm repair 10/07/2011    Procedure: THORACIC ASCENDING ANEURYSM REPAIR (AAA);  Surgeon: Loreli Slot, MD;  Location: [facility] OR;  Service: Open Heart Surgery;  Laterality: N/A;  . Mediastinal exploration 10/08/2011    Procedure: MEDIASTINAL EXPLORATION;  Surgeon: Loreli Slot, MD;  Location: [facility] OR;  Service: Open Heart Surgery;  Laterality: N/A;    Family History  Problem Relation Age of Onset  . Cancer    . Stroke    . Anesthesia problems Neg Hx   . Hypotension Neg Hx   . Malignant hyperthermia Neg Hx   . Pseudochol deficiency Neg Hx      Social History History  Substance Use Topics  . Smoking status: Former Smoker -- 0.5 packs/day for 48 years    Types: Cigarettes    Quit date: 08/28/2011  . Smokeless tobacco: Never Used  . Alcohol Use: No     quit 78yr ago    Current Outpatient Prescriptions  Medication Sig Dispense Refill  . ALPRAZolam (XANAX) 1 MG tablet Take 1 mg by mouth 3 (three) times daily as needed.       [name] amiodarone (PACERONE) 200 MG tablet Take 1 tablet (200 mg total) by mouth 2 (two) times daily.  60 tablet  1  . HYDROcodone-acetaminophen (VICODIN) 5-500 MG per tablet One or two every six hours as needed for pain  30 tablet  0  . metoprolol tartrate (LOPRESSOR) 25 MG tablet Take 1 tablet (25 mg total) by mouth 2 (two) times daily.  60 tablet  1  . pantoprazole (PROTONIX) 40 MG tablet Take 40 mg by mouth daily.        . promethazine (PHENERGAN) 25 MG tablet Take 25 mg by mouth every 6 (six) hours as needed. For nausea      . simvastatin (ZOCOR) 40 MG tablet Take 40 mg by mouth at bedtime.        [name] venlafaxine (EFFEXOR-XR) 150 MG 24 hr capsule Take 150 mg by mouth 2 (two) times daily.       [name] warfarin (COUMADIN) 2 MG tablet Take 1-2 tablets (2-4 mg total) by mouth daily. Take 2 MG alternating daily with 4mg   100 tablet  1    No Known Allergies  Review of Systems otherwise noncontributory  Ht 6' (1.829 m)  Wt 182 lb (82.555 kg)  BMI 24.68 kg/m2 Physical Exam general--well-developed adult male in no acute distress Pulmonary-clear lung fields throughout Cardiac-regular rate and rhythm ,crisp mechanical valve click. no murmur Extremities-no edema Incision-healing well without evidence of infection   Diagnostic Tests: A chest x-ray was obtained on today's date. It reveals improvement in aeration. No findings of congestive failure or pneumonia   Impression: Excellent steady progress.   Plan: We will see him on a when necessary basis or at request. He will continue his Coumadin with management  through Dr. Margarita Mail office. I gave him instructions regarding driving and increasing activities and lifting restrictions.

## 2011-11-13 ENCOUNTER — Ambulatory Visit (INDEPENDENT_AMBULATORY_CARE_PROVIDER_SITE_OTHER): Payer: Medicare Other | Admitting: Physician Assistant

## 2011-11-13 VITALS — BP 130/74 | HR 73 | Ht 72.0 in | Wt 177.4 lb

## 2011-11-13 DIAGNOSIS — I350 Nonrheumatic aortic (valve) stenosis: Secondary | ICD-10-CM

## 2011-11-13 DIAGNOSIS — E785 Hyperlipidemia, unspecified: Secondary | ICD-10-CM

## 2011-11-13 DIAGNOSIS — I359 Nonrheumatic aortic valve disorder, unspecified: Secondary | ICD-10-CM

## 2011-11-13 DIAGNOSIS — Z79899 Other long term (current) drug therapy: Secondary | ICD-10-CM

## 2011-11-13 MED ORDER — ATORVASTATIN CALCIUM 20 MG PO TABS: 20.0000 mg | ORAL_TABLET | Freq: Every day | ORAL | Status: DC

## 2011-11-13 MED ORDER — AMIODARONE HCL 200 MG PO TABS: 200.0000 mg | ORAL_TABLET | Freq: Every day | ORAL | Status: DC

## 2011-11-13 NOTE — Assessment & Plan Note (Signed)
Given that patient remains on amiodarone, will substitute simvastatin with Lipitor 20 mg daily. Reassess lipid status with FLP/LFT profile in 12 weeks.

## 2011-11-13 NOTE — Patient Instructions (Addendum)
Your physician recommends that you schedule a follow-up appointment in: 3 months.  Your physician has recommended you make the following change in your medication: Decrease amiodarone 200 mg to daily. Take this for one month then stop it. Also, stop taking simvastatin and start atorvastatin 20 mg daily. Your metoprolol dose is 25 mg twice daily. Your new prescriptions have been sent to your pharmacy. Your physician recommends that you return for a FASTING lipid/liver profile: in 12 weeks around 02/06/12 at [facility].

## 2011-11-13 NOTE — Assessment & Plan Note (Signed)
Maintaining NSR on amiodarone. Will decrease dose to 200 mg daily (times one month), then discontinue altogether. Patient to remain on Lopressor 25 twice a day.

## 2011-11-13 NOTE — Progress Notes (Signed)
Primary Cardiologist: Lewayne Bunting, MD  HPI: Patient presents in followup from [facility], status post recent emergent open chest repair of a ruptured aortic pseudoaneurysm, with 28 mm Hemashield graft, by Dr. Edwina Barth, on July 9.  Postop course notable for inotropic support with milrinone and dopamine, development of non-oliguric ATN, and maintenance of NSR (history of PAF) on amiodarone. Coumadin anticoagulation was adjusted for increased INR, and has since been followed here in our clinic, by [facility] blood draws. Of note, patient apparently left AMA on July 22.  Patient reportedly was seen for scheduled followup by the surgical team yesterday, and was cleared from their standpoint.  Clinically, he is progressing slowly. He denies tachycardia palpitations.  Twelve-lead EKG today, reviewed by me, indicates NSR with RBBB   No Known Allergies  Current Outpatient Prescriptions  Medication Sig Dispense Refill  . ALPRAZolam (XANAX) 1 MG tablet Take 1 mg by mouth 3 (three) times daily as needed.       [name] amiodarone (PACERONE) 200 MG tablet Take 1 tablet (200 mg total) by mouth daily. TAKE FOR 1 MONTH THEN D/C  30 tablet  0  . atorvastatin (LIPITOR) 20 MG tablet Take 1 tablet (20 mg total) by mouth daily.  30 tablet  6  . HYDROcodone-acetaminophen (VICODIN) 5-500 MG per tablet One or two every six hours as needed for pain  30 tablet  0  . metoprolol tartrate (LOPRESSOR) 25 MG tablet Take 1 tablet (25 mg total) by mouth 2 (two) times daily.  60 tablet  1  . pantoprazole (PROTONIX) 40 MG tablet Take 40 mg by mouth daily.        . promethazine (PHENERGAN) 25 MG tablet Take 25 mg by mouth every 6 (six) hours as needed. For nausea      . venlafaxine (EFFEXOR-XR) 150 MG 24 hr capsule Take 150 mg by mouth 2 (two) times daily.       [name] warfarin (COUMADIN) 2 MG tablet Take 1-2 tablets (2-4 mg total) by mouth daily. Take 2 MG alternating daily with 4mg   100 tablet  1  . DISCONTD: atorvastatin (LIPITOR) 20 MG  tablet Take 20 mg by mouth daily.        Past Medical History  Diagnosis Date  . Murmur     Trace mitral regurgitation by echocardiogram February 2012  . Hyperlipidemia     takes Simvastatin nightly  . Mitral regurgitation     New murmur at the apex consistent with mitral regurgitation  . Aortic stenosis     Bicuspid aortic valve with mild-to-moderate aortic stenosis echocardiogram February 2012 mean gradient 34 mmHg, aortic valve area 0.7 cm, peak velocity 2.43 m/scomment by catheterization mean gradient 18 mmHg December 2010 status post TEE  . CAD (coronary artery disease)     native vessel, nonobstructive, by catheterization, last catheterization December 2010  . Dyslipidemia   . Fibromyalgia   . Peripheral vascular disease   . Myocardial infarction 10 yrs ago  . Shortness of breath     sitting/lying/exertion  . Bronchitis     hx of-25yrs ago  . Ringing in ears     since seizure 63yrs ago-he hit his head  . Seizures     6 yrs ago from withdrawal of xanax to quickly.  . Dizziness   . Confusion   . Short-term memory loss   . Long-term memory loss   . Stroke     behind right eye-occ sees double 12 yrs ago  . Arthritis   .  Joint pain   . Pain and swelling of forearm   . Chronic back pain     lumbar spondylosis  . Dry skin   . GERD (gastroesophageal reflux disease)     takes Protonix daily  . Constipation     takes Miralax prn  . Hx of colonic polyps   . Slow urinary stream   . Nocturia   . Burning with urination   . Major depressive disorder     wih prior suicidal attempts;takes Effexor bid  . Anxiety     takes Xanax 5 times day  . Aortic aneurysm, including pseudoaneurysm     ascending aorta- repaired    Past Surgical History  Procedure Date  . Inguinal exploration     right  . Femoral hernia repair     x2  . Umbilical hernia repair     [facility]  . Finger contracture release     Tendon release operation on his right little finger  . Appendectomy   .  Cholecystectomy     [facility]  . Incisional hernia repair 05/01/2011    Procedure: HERNIA REPAIR INCISIONAL;  Surgeon: Dalia Heading, MD;  Location: [facility];  Service: General;  Laterality: N/A;  with Mesh  . Tonsillectomy     at age 20  . Circumcision   . Cardiac catheterization   . Colonoscopy   . Esophagogastroduodenoscopy 04/07/08/13  . Aortic valve replacement 08/15/2011    Procedure: AORTIC VALVE REPLACEMENT (AVR);  Surgeon: Loreli Slot, MD;  Location: [facility] OR;  Service: Open Heart Surgery;  Laterality: N/A;  . Sternotomy 10/07/2011    Procedure: STERNOTOMY;  Surgeon: Loreli Slot, MD;  Location: [facility] OR;  Service: Open Heart Surgery;  Laterality: N/A;  . Thoracic aortic aneurysm repair 10/07/2011    Procedure: THORACIC ASCENDING ANEURYSM REPAIR (AAA);  Surgeon: Loreli Slot, MD;  Location: [facility] OR;  Service: Open Heart Surgery;  Laterality: N/A;  . Mediastinal exploration 10/08/2011    Procedure: MEDIASTINAL EXPLORATION;  Surgeon: Loreli Slot, MD;  Location: [facility] OR;  Service: Open Heart Surgery;  Laterality: N/A;    History   Social History  . Marital Status: Single    Spouse Name: N/A    Number of Children: N/A  . Years of Education: N/A   Occupational History  . Disabled    Social History Main Topics  . Smoking status: Former Smoker -- 0.5 packs/day for 48 years    Types: Cigarettes    Quit date: 08/28/2011  . Smokeless tobacco: Never Used  . Alcohol Use: No     quit 38yr ago  . Drug Use: No  . Sexually Active: Yes    Birth Control/ Protection: None   Other Topics Concern  . Not on file   Social History Narrative  . No narrative on file   Social History Narrative  . No narrative on file    Problem Relation Age of Onset  . Cancer    . Stroke    . Anesthesia problems Neg Hx   . Hypotension Neg Hx   . Malignant hyperthermia Neg Hx   . Pseudochol deficiency Neg Hx     ROS: no nausea, vomiting; no fever, chills; no melena, hematochezia; no  claudication  PHYSICAL EXAM: BP 130/74  Pulse 73  Ht 6' (1.829 m)  Wt 177 lb 6.4 oz (80.468 kg)  BMI 24.06 kg/m2 GENERAL: 61 year-old male, sitting upright, frail appearing; NAD HEENT: NCAT, PERRLA, EOMI; sclera clear; no  xanthelasma NECK: palpable bilateral carotid pulses, no bruits; no JVD; no TM LUNGS: CTA bilaterally CARDIAC: RRR (S1, S2); 2/6 systolic ejection murmur at base; no rubs or gallops ABDOMEN: soft, non-tender; intact BS EXTREMETIES: intact distal pulses; no significant peripheral edema SKIN: Well-healed midline incision MUSCULOSKELETAL: no joint deformity NEURO: no focal deficit; NL affect   EKG: reviewed and available in Electronic Records   ASSESSMENT & PLAN:  Atrial fibrillation Maintaining NSR on amiodarone. Will decrease dose to 200 mg daily (times one month), then discontinue altogether. Patient to remain on Lopressor 25 twice a day.  Aortic stenosis Patient on chronic Coumadin anticoagulation, followed here in our clinic. Recommended therapeutic INR range (2.0-2.5). As before, recommendation is to DC ASA, once patient attains therapeutic INR levels.  HYPERLIPIDEMIA-MIXED Given that patient remains on amiodarone, will substitute simvastatin with Lipitor 20 mg daily. Reassess lipid status with FLP/LFT profile in 12 weeks.    Gene Maliyah Willets, PAC   HPI:  No Known Allergies  Current Outpatient Prescriptions  Medication Sig Dispense Refill  . ALPRAZolam (XANAX) 1 MG tablet Take 1 mg by mouth 3 (three) times daily as needed.       [name] amiodarone (PACERONE) 200 MG tablet Take 1 tablet (200 mg total) by mouth daily. TAKE FOR 1 MONTH THEN D/C  30 tablet  0  . atorvastatin (LIPITOR) 20 MG tablet Take 1 tablet (20 mg total) by mouth daily.  30 tablet  6  . HYDROcodone-acetaminophen (VICODIN) 5-500 MG per tablet One or two every six hours as needed for pain  30 tablet  0  . metoprolol tartrate (LOPRESSOR) 25 MG tablet Take 1 tablet (25 mg total) by mouth 2 (two)  times daily.  60 tablet  1  . pantoprazole (PROTONIX) 40 MG tablet Take 40 mg by mouth daily.        . promethazine (PHENERGAN) 25 MG tablet Take 25 mg by mouth every 6 (six) hours as needed. For nausea      . venlafaxine (EFFEXOR-XR) 150 MG 24 hr capsule Take 150 mg by mouth 2 (two) times daily.       [name] warfarin (COUMADIN) 2 MG tablet Take 1-2 tablets (2-4 mg total) by mouth daily. Take 2 MG alternating daily with 4mg   100 tablet  1  . DISCONTD: atorvastatin (LIPITOR) 20 MG tablet Take 20 mg by mouth daily.        Past Medical History  Diagnosis Date  . Murmur     Trace mitral regurgitation by echocardiogram February 2012  . Hyperlipidemia     takes Simvastatin nightly  . Mitral regurgitation     New murmur at the apex consistent with mitral regurgitation  . Aortic stenosis     Bicuspid aortic valve with mild-to-moderate aortic stenosis echocardiogram February 2012 mean gradient 34 mmHg, aortic valve area 0.7 cm, peak velocity 2.43 m/scomment by catheterization mean gradient 18 mmHg December 2010 status post TEE  . CAD (coronary artery disease)     native vessel, nonobstructive, by catheterization, last catheterization December 2010  . Dyslipidemia   . Fibromyalgia   . Peripheral vascular disease   . Myocardial infarction 10 yrs ago  . Shortness of breath     sitting/lying/exertion  . Bronchitis     hx of-56yrs ago  . Ringing in ears     since seizure 50yrs ago-he hit his head  . Seizures     6 yrs ago from withdrawal of xanax to quickly.  . Dizziness   . Confusion   .  Short-term memory loss   . Long-term memory loss   . Stroke     behind right eye-occ sees double 12 yrs ago  . Arthritis   . Joint pain   . Pain and swelling of forearm   . Chronic back pain     lumbar spondylosis  . Dry skin   . GERD (gastroesophageal reflux disease)     takes Protonix daily  . Constipation     takes Miralax prn  . Hx of colonic polyps   . Slow urinary stream   . Nocturia   . Burning  with urination   . Major depressive disorder     wih prior suicidal attempts;takes Effexor bid  . Anxiety     takes Xanax 5 times day  . Aortic aneurysm, including pseudoaneurysm     ascending aorta- repaired    Past Surgical History  Procedure Date  . Inguinal exploration     right  . Femoral hernia repair     x2  . Umbilical hernia repair     [facility]  . Finger contracture release     Tendon release operation on his right little finger  . Appendectomy   . Cholecystectomy     [facility]  . Incisional hernia repair 05/01/2011    Procedure: HERNIA REPAIR INCISIONAL;  Surgeon: Dalia Heading, MD;  Location: [facility];  Service: General;  Laterality: N/A;  with Mesh  . Tonsillectomy     at age 35  . Circumcision   . Cardiac catheterization   . Colonoscopy   . Esophagogastroduodenoscopy 04/07/08/13  . Aortic valve replacement 08/15/2011    Procedure: AORTIC VALVE REPLACEMENT (AVR);  Surgeon: Loreli Slot, MD;  Location: [facility] OR;  Service: Open Heart Surgery;  Laterality: N/A;  . Sternotomy 10/07/2011    Procedure: STERNOTOMY;  Surgeon: Loreli Slot, MD;  Location: [facility] OR;  Service: Open Heart Surgery;  Laterality: N/A;  . Thoracic aortic aneurysm repair 10/07/2011    Procedure: THORACIC ASCENDING ANEURYSM REPAIR (AAA);  Surgeon: Loreli Slot, MD;  Location: [facility] OR;  Service: Open Heart Surgery;  Laterality: N/A;  . Mediastinal exploration 10/08/2011    Procedure: MEDIASTINAL EXPLORATION;  Surgeon: Loreli Slot, MD;  Location: [facility] OR;  Service: Open Heart Surgery;  Laterality: N/A;    History   Social History  . Marital Status: Single    Spouse Name: N/A    Number of Children: N/A  . Years of Education: N/A   Occupational History  . Disabled    Social History Main Topics  . Smoking status: Former Smoker -- 0.5 packs/day for 48 years    Types: Cigarettes    Quit date: 08/28/2011  . Smokeless tobacco: Never Used  . Alcohol Use: No     quit 23yr ago  . Drug Use:  No  . Sexually Active: Yes    Birth Control/ Protection: None   Other Topics Concern  . Not on file   Social History Narrative  . No narrative on file   Social History Narrative  . No narrative on file    Problem Relation Age of Onset  . Cancer    . Stroke    . Anesthesia problems Neg Hx   . Hypotension Neg Hx   . Malignant hyperthermia Neg Hx   . Pseudochol deficiency Neg Hx     ROS: no nausea, vomiting; no fever, chills; no melena, hematochezia; no claudication  PHYSICAL EXAM: BP 130/74  Pulse 73  Ht  6' (1.829 m)  Wt 177 lb 6.4 oz (80.468 kg)  BMI 24.06 kg/m2 GENERAL: ; NAD HEENT: NCAT, PERRLA, EOMI; sclera clear; no xanthelasma NECK: palpable bilateral carotid pulses, no bruits; no JVD; no TM LUNGS: CTA bilaterally CARDIAC: RRR (S1, S2); no significant murmurs; no rubs or gallops ABDOMEN: soft, non-tender; intact BS EXTREMETIES: intact distal pulses; no significant peripheral edema SKIN: warm/dry; no obvious rash/lesions MUSCULOSKELETAL: no joint deformity NEURO: no focal deficit; NL affect   EKG: reviewed and available in Electronic Records   ASSESSMENT & PLAN:  Atrial fibrillation Maintaining NSR on amiodarone. Will decrease dose to 200 mg daily (times one month), then discontinue altogether. Patient to remain on Lopressor 25 twice a day.  Aortic stenosis Patient on chronic Coumadin anticoagulation, followed here in our clinic. Recommended therapeutic INR range (2.0-2.5). As before, recommendation is to DC ASA, once patient attains therapeutic INR levels.  HYPERLIPIDEMIA-MIXED Given that patient remains on amiodarone, will substitute simvastatin with Lipitor 20 mg daily. Reassess lipid status with FLP/LFT profile in 12 weeks.    Gene Evalene Vath, PAC

## 2011-11-13 NOTE — Assessment & Plan Note (Signed)
Patient on chronic Coumadin anticoagulation, followed here in our clinic. Recommended therapeutic INR range (2.0-2.5). As before, recommendation is to DC ASA, once patient attains therapeutic INR levels.

## 2011-11-14 ENCOUNTER — Ambulatory Visit (INDEPENDENT_AMBULATORY_CARE_PROVIDER_SITE_OTHER): Payer: Medicare Other | Admitting: *Deleted

## 2011-11-14 DIAGNOSIS — Z954 Presence of other heart-valve replacement: Secondary | ICD-10-CM

## 2011-11-14 DIAGNOSIS — Z7901 Long term (current) use of anticoagulants: Secondary | ICD-10-CM

## 2011-11-14 DIAGNOSIS — Z952 Presence of prosthetic heart valve: Secondary | ICD-10-CM

## 2011-11-14 LAB — POCT INR: INR: 1.4

## 2011-11-18 ENCOUNTER — Ambulatory Visit (INDEPENDENT_AMBULATORY_CARE_PROVIDER_SITE_OTHER): Payer: Medicare Other | Admitting: *Deleted

## 2011-11-18 ENCOUNTER — Telehealth: Payer: Self-pay | Admitting: *Deleted

## 2011-11-18 DIAGNOSIS — Z7901 Long term (current) use of anticoagulants: Secondary | ICD-10-CM

## 2011-11-18 DIAGNOSIS — Z954 Presence of other heart-valve replacement: Secondary | ICD-10-CM

## 2011-11-18 DIAGNOSIS — Z952 Presence of prosthetic heart valve: Secondary | ICD-10-CM

## 2011-11-18 NOTE — Telephone Encounter (Signed)
Called Clydie Braun with coumadin orders.  See coumadin note.

## 2011-11-22 ENCOUNTER — Ambulatory Visit (INDEPENDENT_AMBULATORY_CARE_PROVIDER_SITE_OTHER): Payer: Medicare Other | Admitting: *Deleted

## 2011-11-22 DIAGNOSIS — Z954 Presence of other heart-valve replacement: Secondary | ICD-10-CM

## 2011-11-22 DIAGNOSIS — Z7901 Long term (current) use of anticoagulants: Secondary | ICD-10-CM

## 2011-11-22 DIAGNOSIS — Z952 Presence of prosthetic heart valve: Secondary | ICD-10-CM

## 2011-11-26 ENCOUNTER — Ambulatory Visit: Payer: Self-pay | Admitting: Pharmacist

## 2011-11-26 DIAGNOSIS — Z952 Presence of prosthetic heart valve: Secondary | ICD-10-CM

## 2011-11-26 DIAGNOSIS — Z7901 Long term (current) use of anticoagulants: Secondary | ICD-10-CM

## 2011-11-26 LAB — POCT INR: INR: 2.9

## 2011-12-03 ENCOUNTER — Ambulatory Visit (INDEPENDENT_AMBULATORY_CARE_PROVIDER_SITE_OTHER): Payer: Medicare Other | Admitting: *Deleted

## 2011-12-03 DIAGNOSIS — Z952 Presence of prosthetic heart valve: Secondary | ICD-10-CM

## 2011-12-03 DIAGNOSIS — Z7901 Long term (current) use of anticoagulants: Secondary | ICD-10-CM

## 2011-12-03 DIAGNOSIS — Z954 Presence of other heart-valve replacement: Secondary | ICD-10-CM

## 2011-12-10 ENCOUNTER — Ambulatory Visit (INDEPENDENT_AMBULATORY_CARE_PROVIDER_SITE_OTHER): Payer: Medicare Other | Admitting: *Deleted

## 2011-12-10 DIAGNOSIS — Z952 Presence of prosthetic heart valve: Secondary | ICD-10-CM

## 2011-12-10 DIAGNOSIS — Z7901 Long term (current) use of anticoagulants: Secondary | ICD-10-CM

## 2011-12-10 DIAGNOSIS — Z954 Presence of other heart-valve replacement: Secondary | ICD-10-CM

## 2011-12-19 DIAGNOSIS — Z0271 Encounter for disability determination: Secondary | ICD-10-CM

## 2011-12-20 ENCOUNTER — Ambulatory Visit (INDEPENDENT_AMBULATORY_CARE_PROVIDER_SITE_OTHER): Payer: Medicare Other | Admitting: *Deleted

## 2011-12-20 DIAGNOSIS — Z7901 Long term (current) use of anticoagulants: Secondary | ICD-10-CM

## 2011-12-20 DIAGNOSIS — Z954 Presence of other heart-valve replacement: Secondary | ICD-10-CM

## 2011-12-20 DIAGNOSIS — Z952 Presence of prosthetic heart valve: Secondary | ICD-10-CM

## 2012-01-03 ENCOUNTER — Ambulatory Visit (INDEPENDENT_AMBULATORY_CARE_PROVIDER_SITE_OTHER): Payer: Medicare Other | Admitting: *Deleted

## 2012-01-03 DIAGNOSIS — Z952 Presence of prosthetic heart valve: Secondary | ICD-10-CM

## 2012-01-03 DIAGNOSIS — Z7901 Long term (current) use of anticoagulants: Secondary | ICD-10-CM

## 2012-01-03 DIAGNOSIS — Z954 Presence of other heart-valve replacement: Secondary | ICD-10-CM

## 2012-01-03 LAB — POCT INR: INR: 1.6

## 2012-01-10 ENCOUNTER — Ambulatory Visit (INDEPENDENT_AMBULATORY_CARE_PROVIDER_SITE_OTHER): Payer: Medicare Other | Admitting: *Deleted

## 2012-01-10 DIAGNOSIS — Z954 Presence of other heart-valve replacement: Secondary | ICD-10-CM

## 2012-01-10 DIAGNOSIS — Z7901 Long term (current) use of anticoagulants: Secondary | ICD-10-CM

## 2012-01-10 DIAGNOSIS — Z952 Presence of prosthetic heart valve: Secondary | ICD-10-CM

## 2012-01-21 ENCOUNTER — Ambulatory Visit (INDEPENDENT_AMBULATORY_CARE_PROVIDER_SITE_OTHER): Payer: Medicare Other | Admitting: *Deleted

## 2012-01-21 DIAGNOSIS — Z7901 Long term (current) use of anticoagulants: Secondary | ICD-10-CM

## 2012-01-21 DIAGNOSIS — Z954 Presence of other heart-valve replacement: Secondary | ICD-10-CM

## 2012-01-21 DIAGNOSIS — Z952 Presence of prosthetic heart valve: Secondary | ICD-10-CM

## 2012-01-31 ENCOUNTER — Ambulatory Visit (INDEPENDENT_AMBULATORY_CARE_PROVIDER_SITE_OTHER): Payer: Medicare Other | Admitting: *Deleted

## 2012-01-31 DIAGNOSIS — Z7901 Long term (current) use of anticoagulants: Secondary | ICD-10-CM

## 2012-01-31 DIAGNOSIS — Z952 Presence of prosthetic heart valve: Secondary | ICD-10-CM

## 2012-01-31 DIAGNOSIS — Z954 Presence of other heart-valve replacement: Secondary | ICD-10-CM

## 2012-01-31 LAB — POCT INR: INR: 2.1

## 2012-02-14 ENCOUNTER — Ambulatory Visit (INDEPENDENT_AMBULATORY_CARE_PROVIDER_SITE_OTHER): Payer: Medicare Other | Admitting: *Deleted

## 2012-02-14 DIAGNOSIS — Z952 Presence of prosthetic heart valve: Secondary | ICD-10-CM

## 2012-02-14 DIAGNOSIS — Z954 Presence of other heart-valve replacement: Secondary | ICD-10-CM

## 2012-02-14 DIAGNOSIS — Z7901 Long term (current) use of anticoagulants: Secondary | ICD-10-CM

## 2012-02-14 LAB — POCT INR: INR: 1.9

## 2012-02-19 ENCOUNTER — Ambulatory Visit: Payer: Medicare Other | Admitting: Cardiology

## 2012-02-19 ENCOUNTER — Telehealth: Payer: Self-pay | Admitting: *Deleted

## 2012-02-19 NOTE — Telephone Encounter (Signed)
Spoke with patient r/e pass due labs and patient said he was going to have them done at PCP (Daniel's) office on December 3,2013 and would have results sent to our office.

## 2012-02-20 ENCOUNTER — Encounter: Payer: Self-pay | Admitting: Cardiology

## 2012-02-20 ENCOUNTER — Ambulatory Visit (INDEPENDENT_AMBULATORY_CARE_PROVIDER_SITE_OTHER): Payer: Medicare Other | Admitting: Cardiology

## 2012-02-20 VITALS — BP 146/90 | HR 86 | Ht 72.0 in | Wt 193.0 lb

## 2012-02-20 DIAGNOSIS — I350 Nonrheumatic aortic (valve) stenosis: Secondary | ICD-10-CM

## 2012-02-20 DIAGNOSIS — I4891 Unspecified atrial fibrillation: Secondary | ICD-10-CM

## 2012-02-20 DIAGNOSIS — Z952 Presence of prosthetic heart valve: Secondary | ICD-10-CM

## 2012-02-20 DIAGNOSIS — I719 Aortic aneurysm of unspecified site, without rupture: Secondary | ICD-10-CM

## 2012-02-20 DIAGNOSIS — I359 Nonrheumatic aortic valve disorder, unspecified: Secondary | ICD-10-CM

## 2012-02-20 DIAGNOSIS — Z954 Presence of other heart-valve replacement: Secondary | ICD-10-CM

## 2012-02-20 DIAGNOSIS — I2510 Atherosclerotic heart disease of native coronary artery without angina pectoris: Secondary | ICD-10-CM

## 2012-02-20 DIAGNOSIS — E785 Hyperlipidemia, unspecified: Secondary | ICD-10-CM

## 2012-02-20 DIAGNOSIS — I10 Essential (primary) hypertension: Secondary | ICD-10-CM

## 2012-02-20 MED ORDER — RAMIPRIL 2.5 MG PO CAPS: 2.5000 mg | ORAL_CAPSULE | Freq: Every day | ORAL | Status: DC

## 2012-02-20 NOTE — Assessment & Plan Note (Signed)
Nonobstructive disease, no active angina.

## 2012-02-20 NOTE — Progress Notes (Signed)
Clinical Summary Mr. Encarnacion is a medically complex 61 y.o.male resenting for followup. He is a former patient of Dr. Andee Lineman, prefers to stay with [facility]. He was seen most recently in the office in August by Mr. Serpe. Complex history was reviewed.  He remains short of breath with activity, NYHA class 2-3, states that this has been a chronic problem however. He continues on Coumadin with followup in our clinic, denies any bleeding episodes. He has problems with his memory, does seem to indicate compliance with his medications however. He is due to see Dr. [name] soon for followup lab work.  He has transitioned off of amiodarone since last visit, ECG today showing sinus rhythm with right bundle branch block.  No Known Allergies  Current Outpatient Prescriptions  Medication Sig Dispense Refill  . ALPRAZolam (XANAX) 1 MG tablet Take 1 mg by mouth 3 (three) times daily as needed.       [name] atorvastatin (LIPITOR) 20 MG tablet Take 1 tablet (20 mg total) by mouth daily.  30 tablet  6  . HYDROcodone-acetaminophen (VICODIN) 5-500 MG per tablet One or two every six hours as needed for pain  30 tablet  0  . metoprolol tartrate (LOPRESSOR) 25 MG tablet Take 1 tablet (25 mg total) by mouth 2 (two) times daily.  60 tablet  1  . pantoprazole (PROTONIX) 40 MG tablet Take 40 mg by mouth daily.        . promethazine (PHENERGAN) 25 MG tablet Take 25 mg by mouth every 6 (six) hours as needed. For nausea      . venlafaxine (EFFEXOR-XR) 150 MG 24 hr capsule Take 150 mg by mouth 2 (two) times daily.       [name] warfarin (COUMADIN) 2 MG tablet Take 1-2 tablets (2-4 mg total) by mouth daily. Take 2 MG alternating daily with 4mg   100 tablet  1  . ramipril (ALTACE) 2.5 MG capsule Take 1 capsule (2.5 mg total) by mouth daily.  90 capsule  3    Past Medical History  Diagnosis Date  . Hyperlipidemia   . Aortic stenosis     Status post  5/13  . Coronary atherosclerosis of native coronary artery     Nonobstructive  .  Dyslipidemia   . Fibromyalgia   . Peripheral vascular disease   . Myocardial infarction 10 yrs ago  . Tinnitus     Following prior seizures  . Seizures     6 yrs ago from withdrawal of Xanax to quickly.  . Long-term memory loss   . Stroke   . Arthritis   . Chronic back pain     Lumbar spondylosis  . GERD (gastroesophageal reflux disease)   . Hx of colonic polyps   . Nocturia   . Major depressive disorder     Prior suicidal attempts  . Anxiety   . Thoracic aortic aneurysm     Status post repair 7/13  . Atrial fibrillation     Past Surgical History  Procedure Date  . Inguinal exploration     right  . Femoral hernia repair     x2  . Umbilical hernia repair     [facility]  . Finger contracture release     Tendon release operation on his right little finger  . Appendectomy   . Cholecystectomy     [facility]  . Incisional hernia repair 05/01/2011    Procedure: HERNIA REPAIR INCISIONAL;  Surgeon: Dalia Heading, MD;  Location: [facility];  Service: General;  Laterality: N/A;  with Mesh  . Tonsillectomy     at age 48  . Circumcision   . Cardiac catheterization   . Colonoscopy   . Esophagogastroduodenoscopy 04/07/08/13  . Aortic valve replacement 08/15/2011    Procedure: AORTIC VALVE REPLACEMENT (AVR);  Surgeon: Loreli Slot, MD;  Location: [facility] OR;  Service: Open Heart Surgery;  Laterality: N/A;  . Sternotomy 10/07/2011    Procedure: STERNOTOMY;  Surgeon: Loreli Slot, MD;  Location: [facility] OR;  Service: Open Heart Surgery;  Laterality: N/A;  . Thoracic aortic aneurysm repair 10/07/2011    Procedure: THORACIC ASCENDING ANEURYSM REPAIR (AAA);  Surgeon: Loreli Slot, MD;  Location: [facility] OR;  Service: Open Heart Surgery;  Laterality: N/A;  . Mediastinal exploration 10/08/2011    Procedure: MEDIASTINAL EXPLORATION;  Surgeon: Loreli Slot, MD;  Location: [facility] OR;  Service: Open Heart Surgery;  Laterality: N/A;    Social History Mr. Yoshino reports that he quit smoking about 5  months ago. His smoking use included Cigarettes. He has a 24 pack-year smoking history. He has never used smokeless tobacco. Mr. Gonyea reports that he does not drink alcohol.  Review of Systems Denies any palpitations, dizziness, syncope. Stable appetite. Has a small superficial skin boil on his right chest. Otherwise negative.  Physical Examination Filed Vitals:   02/20/12 0919  BP: 146/90  Pulse: 86   Filed Weights   02/20/12 0919  Weight: 193 lb (87.544 kg)   No acute distress. HEENT: Conjunctiva and lids normal, oropharynx clear. Neck: Supple, no elevated JVP or carotid bruits, no thyromegaly. Lungs: Clear to auscultation, nonlabored breathing at rest. Cardiac: Regular rate and rhythm, loud crisp prosthetic valve click in S2, no S3, soft systolic murmur, no pericardial rub. Abdomen: Soft, nontender, bowel sounds present. Extremities: No pitting edema, distal pulses 2+. Skin: Warm and dry. Musculoskeletal: No kyphosis. Neuropsychiatric: Alert and oriented x3, affect grossly appropriate.   Problem List and Plan   Aortic stenosis Bicuspid valve status post mechanical AVR, on Coumadin. Followup echocardiogram to be obtained.  Atrial fibrillation Maintaining sinus rhythm, and now off amiodarone. Continue beta blocker.  Essential hypertension, benign Plan to add Altace 2.5 mg daily, follow blood pressure. He is to have followup lab work in the next 2 weeks with Dr. [name].  Aortic aneurysm, including pseudoaneurysm Status post emergent thoracic aortic aneurysm repair by Dr. Dorris Fetch 7/13.  HYPERLIPIDEMIA-MIXED He continues on Lipitor, followup lab work pending.  Coronary atherosclerosis of native coronary artery Nonobstructive disease, no active angina.    Jonelle Sidle, M.D., F.A.C.C.

## 2012-02-20 NOTE — Patient Instructions (Addendum)
Your physician recommends that you schedule a follow-up appointment in: 3 months. Your physician has recommended you make the following change in your medication: Start ramipril 2.5 mg daily. Your new prescription has been sent to your pharmacy. All other medications will remain the same. Your physician has requested that you have an echocardiogram. Echocardiography is a painless test that uses sound waves to create images of your heart. It provides your doctor with information about the size and shape of your heart and how well your heart's chambers and valves are working. This procedure takes approximately one hour. There are no restrictions for this procedure.

## 2012-02-20 NOTE — Assessment & Plan Note (Signed)
Bicuspid valve status post mechanical AVR, on Coumadin. Followup echocardiogram to be obtained.

## 2012-02-20 NOTE — Assessment & Plan Note (Signed)
Plan to add Altace 2.5 mg daily, follow blood pressure. He is to have followup lab work in the next 2 weeks with Dr. [name].

## 2012-02-20 NOTE — Assessment & Plan Note (Signed)
Status post emergent thoracic aortic aneurysm repair by Dr. Dorris Fetch 7/13.

## 2012-02-20 NOTE — Assessment & Plan Note (Signed)
Maintaining sinus rhythm, and now off amiodarone. Continue beta blocker.

## 2012-02-20 NOTE — Assessment & Plan Note (Signed)
He continues on Lipitor, followup lab work pending.

## 2012-03-05 ENCOUNTER — Other Ambulatory Visit: Payer: Self-pay

## 2012-03-05 ENCOUNTER — Other Ambulatory Visit (INDEPENDENT_AMBULATORY_CARE_PROVIDER_SITE_OTHER): Payer: Medicare Other

## 2012-03-05 DIAGNOSIS — Z952 Presence of prosthetic heart valve: Secondary | ICD-10-CM

## 2012-03-05 DIAGNOSIS — I359 Nonrheumatic aortic valve disorder, unspecified: Secondary | ICD-10-CM

## 2012-03-06 ENCOUNTER — Ambulatory Visit (INDEPENDENT_AMBULATORY_CARE_PROVIDER_SITE_OTHER): Payer: Medicare Other | Admitting: *Deleted

## 2012-03-06 DIAGNOSIS — Z954 Presence of other heart-valve replacement: Secondary | ICD-10-CM

## 2012-03-06 DIAGNOSIS — Z7901 Long term (current) use of anticoagulants: Secondary | ICD-10-CM

## 2012-03-06 DIAGNOSIS — Z952 Presence of prosthetic heart valve: Secondary | ICD-10-CM

## 2012-03-10 ENCOUNTER — Telehealth: Payer: Self-pay | Admitting: *Deleted

## 2012-03-10 NOTE — Telephone Encounter (Signed)
Patient informed. 

## 2012-03-10 NOTE — Telephone Encounter (Signed)
Message copied by Eustace Moore on Tue Mar 10, 2012 10:27 AM ------      Message from: Jonelle Sidle      Created: Sun Mar 08, 2012  8:21 PM       Normal LVEF and normal prosthetic AV function.

## 2012-03-19 ENCOUNTER — Telehealth: Payer: Self-pay | Admitting: *Deleted

## 2012-03-19 NOTE — Telephone Encounter (Signed)
Left message for patient to call office.  

## 2012-03-20 ENCOUNTER — Ambulatory Visit (INDEPENDENT_AMBULATORY_CARE_PROVIDER_SITE_OTHER): Payer: Medicare Other | Admitting: *Deleted

## 2012-03-20 DIAGNOSIS — Z954 Presence of other heart-valve replacement: Secondary | ICD-10-CM

## 2012-03-20 DIAGNOSIS — Z952 Presence of prosthetic heart valve: Secondary | ICD-10-CM

## 2012-03-20 DIAGNOSIS — Z7901 Long term (current) use of anticoagulants: Secondary | ICD-10-CM

## 2012-03-20 LAB — POCT INR: INR: 2.4

## 2012-03-20 NOTE — Telephone Encounter (Signed)
Patient in office and informed nurse that labs were done last week at his PCP office. Nurse called and requested labs from Dr. Rosann Auerbach office.

## 2012-04-06 ENCOUNTER — Other Ambulatory Visit (HOSPITAL_COMMUNITY): Payer: Self-pay | Admitting: Cardiology

## 2012-04-07 ENCOUNTER — Other Ambulatory Visit: Payer: Self-pay | Admitting: *Deleted

## 2012-04-07 MED ORDER — WARFARIN SODIUM 2 MG PO TABS: 2.0000 mg | ORAL_TABLET | Freq: Every day | ORAL | Status: DC

## 2012-04-10 ENCOUNTER — Ambulatory Visit (INDEPENDENT_AMBULATORY_CARE_PROVIDER_SITE_OTHER): Payer: Medicare Other | Admitting: *Deleted

## 2012-04-10 DIAGNOSIS — Z952 Presence of prosthetic heart valve: Secondary | ICD-10-CM

## 2012-04-10 DIAGNOSIS — Z954 Presence of other heart-valve replacement: Secondary | ICD-10-CM

## 2012-04-10 DIAGNOSIS — Z7901 Long term (current) use of anticoagulants: Secondary | ICD-10-CM

## 2012-04-10 LAB — POCT INR: INR: 2.2

## 2012-05-08 ENCOUNTER — Ambulatory Visit (INDEPENDENT_AMBULATORY_CARE_PROVIDER_SITE_OTHER): Payer: Medicare Other | Admitting: *Deleted

## 2012-05-08 DIAGNOSIS — Z952 Presence of prosthetic heart valve: Secondary | ICD-10-CM

## 2012-05-08 DIAGNOSIS — Z7901 Long term (current) use of anticoagulants: Secondary | ICD-10-CM

## 2012-05-08 DIAGNOSIS — Z954 Presence of other heart-valve replacement: Secondary | ICD-10-CM

## 2012-05-26 ENCOUNTER — Encounter: Payer: Self-pay | Admitting: Cardiology

## 2012-05-26 ENCOUNTER — Ambulatory Visit (INDEPENDENT_AMBULATORY_CARE_PROVIDER_SITE_OTHER): Payer: Medicare Other | Admitting: Cardiology

## 2012-05-26 ENCOUNTER — Ambulatory Visit (INDEPENDENT_AMBULATORY_CARE_PROVIDER_SITE_OTHER): Payer: Medicare Other | Admitting: *Deleted

## 2012-05-26 VITALS — BP 118/78 | HR 86 | Ht 72.0 in | Wt 211.0 lb

## 2012-05-26 DIAGNOSIS — Z954 Presence of other heart-valve replacement: Secondary | ICD-10-CM

## 2012-05-26 DIAGNOSIS — I10 Essential (primary) hypertension: Secondary | ICD-10-CM

## 2012-05-26 DIAGNOSIS — Z7901 Long term (current) use of anticoagulants: Secondary | ICD-10-CM

## 2012-05-26 DIAGNOSIS — I4891 Unspecified atrial fibrillation: Secondary | ICD-10-CM

## 2012-05-26 LAB — POCT INR: INR: 2.5

## 2012-05-26 NOTE — Assessment & Plan Note (Signed)
He has been off Lipitor, reporting some itchiness in his legs to Dr. [name]. Stopping the medication did not help. His symptoms have improved with Claritin. I have asked that he resume Lipitor.

## 2012-05-26 NOTE — Patient Instructions (Addendum)

## 2012-05-26 NOTE — Assessment & Plan Note (Signed)
Normal blood pressure today. 

## 2012-05-26 NOTE — Assessment & Plan Note (Signed)
Quiescent, off amiodarone. He remains in sinus rhythm.

## 2012-05-26 NOTE — Progress Notes (Signed)
Clinical Summary [name] is a medically complex 61 y.o.male presenting for followup. He was last seen in November 2013. He reports decreased energy, occasional paresthesias in his abdomen. No frank abdominal pain or change in appetite. No significant palpitations.  Lab work from December 2013 showed BUN 9, creatinine 1.2, potassium 3.8, AST 18, ALT 16, hemoglobin 13.4, platelets 202, cholesterol 148, triglycerides 180, HDL 28, LDL 84. Echocardiogram also from December showed mild LVH with LVEF 50-65%, stable aortic prosthesis with mean gradient 5 mm mercury, moderate biatrial enlargement. We reviewed this today.  ECG today shows sinus rhythm with right bundle branch block. He reports no problems with his Coumadin, specifically no bleeding episodes.  We discussed a basic walking regimen, as he has not had any regular exercise.   No Known Allergies  Current Outpatient Prescriptions  Medication Sig Dispense Refill  . ALPRAZolam (XANAX) 1 MG tablet Take 1 mg by mouth 3 (three) times daily as needed.       [name] HYDROcodone-acetaminophen (NORCO/VICODIN) 5-325 MG per tablet Take 1 tablet by mouth 3 (three) times daily as needed for pain.      [name] levothyroxine (SYNTHROID, LEVOTHROID) 50 MCG tablet Take 50 mcg by mouth daily.      [name] loratadine (CLARITIN) 10 MG tablet Take 10 mg by mouth daily.      . metoprolol tartrate (LOPRESSOR) 25 MG tablet Take 1 tablet (25 mg total) by mouth 2 (two) times daily.  60 tablet  1  . pantoprazole (PROTONIX) 40 MG tablet Take 40 mg by mouth daily.        . promethazine (PHENERGAN) 25 MG tablet Take 25 mg by mouth every 6 (six) hours as needed. For nausea      . ramipril (ALTACE) 2.5 MG capsule Take 1 capsule (2.5 mg total) by mouth daily.  90 capsule  3  . venlafaxine (EFFEXOR-XR) 150 MG 24 hr capsule Take 150 mg by mouth 2 (two) times daily.       [name] warfarin (COUMADIN) 2 MG tablet Take 1-2 tablets (2-4 mg total) by mouth daily. Take 2 MG alternating daily with 4mg    100 tablet  3   No current facility-administered medications for this visit.    Past Medical History  Diagnosis Date  . Hyperlipidemia   . Aortic stenosis     Status post  5/13  . Coronary atherosclerosis of native coronary artery     Nonobstructive  . Dyslipidemia   . Fibromyalgia   . Peripheral vascular disease   . Myocardial infarction 10 yrs ago  . Tinnitus     Following prior seizures  . Seizures     6 yrs ago from withdrawal of Xanax to quickly.  . Long-term memory loss   . Stroke   . Arthritis   . Chronic back pain     Lumbar spondylosis  . GERD (gastroesophageal reflux disease)   . Hx of colonic polyps   . Nocturia   . Major depressive disorder     Prior suicidal attempts  . Anxiety   . Thoracic aortic aneurysm     Status post repair 7/13  . Atrial fibrillation     Social History [name] reports that he has been smoking Cigarettes.  He has a 24 pack-year smoking history. He has never used smokeless tobacco. [name] reports that he does not drink alcohol.  Review of Systems No fevers or chills. No melena or hematochezia. No syncope. Otherwise negative.   Physical Examination Filed  Vitals:   05/26/12 0819  BP: 118/78  Pulse: 86   Filed Weights   05/26/12 0819  Weight: 211 lb (95.709 kg)    No acute distress.  HEENT: Conjunctiva and lids normal, oropharynx clear.  Neck: Supple, no elevated JVP or carotid bruits, no thyromegaly.  Lungs: Clear to auscultation, nonlabored breathing at rest.  Cardiac: Regular rate and rhythm, loud crisp prosthetic valve click in S2, no S3, soft systolic murmur, no pericardial rub.  Abdomen: Soft, nontender, bowel sounds present. Surgical scars well-healed. Extremities: No pitting edema, distal pulses 2+.  Skin: Warm and dry. No ecchymoses.    Problem List and Plan   Aortic stenosis Status post mechanical AVR in May 2013. Recent echocardiogram shows normal function. Continue Coumadin.  Coronary atherosclerosis  of native coronary artery Nonobstructive, no active angina.  Atrial fibrillation Quiescent, off amiodarone. He remains in sinus rhythm.  Essential hypertension, benign Normal blood pressure today.  HYPERLIPIDEMIA-MIXED He has been off Lipitor, reporting some itchiness in his legs to Dr. [name]. Stopping the medication did not help. His symptoms have improved with Claritin. I have asked that he resume Lipitor.    Jonelle Sidle, M.D., F.A.C.C.

## 2012-05-26 NOTE — Assessment & Plan Note (Signed)
Nonobstructive, no active angina.

## 2012-05-26 NOTE — Assessment & Plan Note (Signed)
Status post mechanical AVR in May 2013. Recent echocardiogram shows normal function. Continue Coumadin.

## 2012-06-10 ENCOUNTER — Other Ambulatory Visit: Payer: Self-pay | Admitting: *Deleted

## 2012-06-10 MED ORDER — WARFARIN SODIUM 2 MG PO TABS: ORAL_TABLET | ORAL | Status: DC

## 2012-07-10 ENCOUNTER — Encounter: Payer: Self-pay | Admitting: Physician Assistant

## 2012-07-14 ENCOUNTER — Telehealth: Payer: Self-pay | Admitting: *Deleted

## 2012-07-14 NOTE — Telephone Encounter (Signed)
Pt states he has been in hospital.  Dr [name] has been managing coumadin and results are messed up per pt.  Wants to come back here for coumadin management.  Appt made for 4/18.  He is on coumadin 4mg  daily at this time.  He will let Dr Rosann Auerbach office know he is coming back here for INRs checks.

## 2012-07-17 ENCOUNTER — Ambulatory Visit (INDEPENDENT_AMBULATORY_CARE_PROVIDER_SITE_OTHER): Payer: Medicare Other | Admitting: *Deleted

## 2012-07-17 DIAGNOSIS — Z954 Presence of other heart-valve replacement: Secondary | ICD-10-CM

## 2012-07-17 DIAGNOSIS — Z952 Presence of prosthetic heart valve: Secondary | ICD-10-CM

## 2012-07-17 DIAGNOSIS — Z7901 Long term (current) use of anticoagulants: Secondary | ICD-10-CM

## 2012-07-17 LAB — POCT INR: INR: 2.5

## 2012-07-21 ENCOUNTER — Encounter (INDEPENDENT_AMBULATORY_CARE_PROVIDER_SITE_OTHER): Payer: Self-pay | Admitting: *Deleted

## 2012-07-23 ENCOUNTER — Encounter: Payer: Self-pay | Admitting: Physician Assistant

## 2012-07-23 ENCOUNTER — Ambulatory Visit (INDEPENDENT_AMBULATORY_CARE_PROVIDER_SITE_OTHER): Payer: Medicare Other | Admitting: Physician Assistant

## 2012-07-23 ENCOUNTER — Telehealth: Payer: Self-pay

## 2012-07-23 VITALS — BP 120/76 | HR 70 | Ht 72.0 in | Wt 207.0 lb

## 2012-07-23 DIAGNOSIS — Z954 Presence of other heart-valve replacement: Secondary | ICD-10-CM

## 2012-07-23 DIAGNOSIS — R079 Chest pain, unspecified: Secondary | ICD-10-CM

## 2012-07-23 DIAGNOSIS — I4891 Unspecified atrial fibrillation: Secondary | ICD-10-CM

## 2012-07-23 DIAGNOSIS — I10 Essential (primary) hypertension: Secondary | ICD-10-CM

## 2012-07-23 DIAGNOSIS — Z952 Presence of prosthetic heart valve: Secondary | ICD-10-CM

## 2012-07-23 NOTE — Telephone Encounter (Signed)
Patient called to request copies of his medical records, specific to surgery performed by Dr. Dorris Fetch in May, 2013 and July, 2013.  He was reminded that we had previously sent him this information in September, 2013.  He states that he is bothered by a noise in his chest and intermittent rapid heart beats and attributes his symptoms .  It is noted that he was seen today in his cardiologist's office, and when asked about that visit and if he communicated these symptoms to the provider, he stated that he did not, as they were too busy.  He has not been seen in the [facility] office since August, 2013, at which time he was found to be progressing well.  He was insistent that he receive another copy of the operative note from his July 2013 surgery, as he wanted to know exactly what was done and why.  I recommended that he come in to see Dr. Dorris Fetch, who could explain the procedure in detail and the reasons for it.  He declined.  I offered to have Dr. Dorris Fetch contact him by phone, he also declined that offer.  He stated that he would call Dr. Diona Browner.

## 2012-07-23 NOTE — Progress Notes (Signed)
Primary Cardiologist: Simona Huh, MD   HPI: Post hospital followup from [facility], following recent overnight stay for evaluation of atypical CP. Troponins NL. We were not formally consulted, and patient was discharged the following day, under Dr. Rosann Auerbach care.  On presentation to the ED, he cited several day history of SOB, fatigue, bilateral extremity numbness, bilateral axilla soreness, and generalized malaise. Extensive workup yielded no evidence of CHF and NL d-dimer. There was mild anemia. Patient was then seen for early followup by Dr. [name], with no reported medication adjustments. He has resumed followup with our Coumadin clinic.  He presents today reporting intermittent CP, but with no strict exertional component. He also denies any symptoms suggestive of congestive heart failure.  2-D echocardiogram, March 28, ordered by Dr. Andee Lineman, yielded EF 50-55%; inferioseptal HK; normal RVF; NL functioning mechanical AVR; no PFO  12-lead EKG today, reviewed by me, indicates NSR at 70 bpm with chronic RBBB and nonspecific ST changes.   No Known Allergies  Current Outpatient Prescriptions  Medication Sig Dispense Refill  . ALPRAZolam (XANAX) 1 MG tablet Take 1 mg by mouth 3 (three) times daily as needed.       [name] atorvastatin (LIPITOR) 40 MG tablet Take 40 mg by mouth daily.       [name] HYDROcodone-acetaminophen (NORCO/VICODIN) 5-325 MG per tablet Take 1 tablet by mouth 3 (three) times daily as needed for pain.      [name] levothyroxine (SYNTHROID, LEVOTHROID) 50 MCG tablet Take 50 mcg by mouth daily.      [name] loratadine (CLARITIN) 10 MG tablet Take 10 mg by mouth daily.      . metoprolol tartrate (LOPRESSOR) 25 MG tablet Take 1 tablet (25 mg total) by mouth 2 (two) times daily.  60 tablet  1  . NITROSTAT 0.4 MG SL tablet Place 0.4 mg under the tongue every 5 (five) minutes as needed for chest pain.       . pantoprazole (PROTONIX) 40 MG tablet Take 40 mg by mouth daily.        . promethazine (PHENERGAN) 25  MG tablet Take 25 mg by mouth every 6 (six) hours as needed. For nausea      . ramipril (ALTACE) 2.5 MG capsule Take 1 capsule (2.5 mg total) by mouth daily.  90 capsule  3  . venlafaxine (EFFEXOR-XR) 150 MG 24 hr capsule Take 150 mg by mouth 2 (two) times daily.       [name] warfarin (COUMADIN) 2 MG tablet Take 1 1/2 tablets daily except 1 tablet on Sundays  100 tablet  1   No current facility-administered medications for this visit.    Past Medical History  Diagnosis Date  . Hyperlipidemia   . Aortic stenosis     Status post  5/13  . Coronary atherosclerosis of native coronary artery     Nonobstructive  . Dyslipidemia   . Fibromyalgia   . Peripheral vascular disease   . Myocardial infarction 10 yrs ago  . Tinnitus     Following prior seizures  . Seizures     6 yrs ago from withdrawal of Xanax to quickly.  . Long-term memory loss   . Stroke   . Arthritis   . Chronic back pain     Lumbar spondylosis  . GERD (gastroesophageal reflux disease)   . Hx of colonic polyps   . Nocturia   . Major depressive disorder     Prior suicidal attempts  . Anxiety   . Thoracic aortic aneurysm  Status post repair 7/13  . Atrial fibrillation     Past Surgical History  Procedure Laterality Date  . Inguinal exploration      right  . Femoral hernia repair      x2  . Umbilical hernia repair      [facility]  . Finger contracture release      Tendon release operation on his right little finger  . Appendectomy    . Cholecystectomy      [facility]  . Incisional hernia repair  05/01/2011    Procedure: HERNIA REPAIR INCISIONAL;  Surgeon: Dalia Heading, MD;  Location: [facility];  Service: General;  Laterality: N/A;  with Mesh  . Tonsillectomy      at age 89  . Circumcision    . Cardiac catheterization    . Colonoscopy    . Esophagogastroduodenoscopy  04/07/08/13  . Aortic valve replacement  08/15/2011    Procedure: AORTIC VALVE REPLACEMENT (AVR);  Surgeon: Loreli Slot, MD;  Location: [facility] OR;   Service: Open Heart Surgery;  Laterality: N/A;  . Sternotomy  10/07/2011    Procedure: STERNOTOMY;  Surgeon: Loreli Slot, MD;  Location: [facility] OR;  Service: Open Heart Surgery;  Laterality: N/A;  . Thoracic aortic aneurysm repair  10/07/2011    Procedure: THORACIC ASCENDING ANEURYSM REPAIR (AAA);  Surgeon: Loreli Slot, MD;  Location[facility] OR;  Service: Open Heart Surgery;  Laterality: N/A;  . Mediastinal exploration  10/08/2011    Procedure: MEDIASTINAL EXPLORATION;  Surgeon: Loreli Slot, MD;  Location: [facility] OR;  Service: Open Heart Surgery;  Laterality: N/A;    History   Social History  . Marital Status: Single    Spouse Name: N/A    Number of Children: N/A  . Years of Education: N/A   Occupational History  . Disabled    Social History Main Topics  . Smoking status: Current Every Day Smoker -- 0.50 packs/day for 48 years    Types: Cigarettes  . Smokeless tobacco: Never Used  . Alcohol Use: No     Comment: quit 75yr ago  . Drug Use: No  . Sexually Active: Yes    Birth Control/ Protection: None   Other Topics Concern  . Not on file   Social History Narrative  . No narrative on file    Family History  Problem Relation Age of Onset  . Cancer    . Stroke    . Anesthesia problems Neg Hx   . Hypotension Neg Hx   . Malignant hyperthermia Neg Hx   . Pseudochol deficiency Neg Hx     ROS: no nausea, vomiting; no fever, chills; no melena, hematochezia; no claudication  PHYSICAL EXAM: BP 120/76  Pulse 70  Ht 6' (1.829 m)  Wt 207 lb (93.895 kg)  BMI 28.07 kg/m2 GENERAL: 62 year-old male; NAD HEENT: NCAT, PERRLA, EOMI; sclera clear; no xanthelasma NECK: palpable bilateral carotid pulses, no bruits; no JVD; no TM LUNGS: CTA bilaterally CARDIAC: RRR (S1, S2); Crisp, loud S2, no significant murmurs; no rubs or gallops ABDOMEN: soft, non-tender; intact BS EXTREMETIES: no significant peripheral edema SKIN: warm/dry; no obvious rash/lesions MUSCULOSKELETAL: no  joint deformity NEURO: no focal deficit; NL affect   EKG: reviewed and available in Electronic Records   ASSESSMENT & PLAN:  Chest pain No further workup indicated. Patient presents with a variety of symptoms, including vague atypical CP with no strict correlation with exertion. Moreover, he was recently briefly hospitalized, with all troponins within NL limits.  He has history of nonobstructive CAD by multiple previous catheterizations, most recently in 2010.  Atrial fibrillation Maintaining NSR as confirmed by EKG. Patient previously on amiodarone  S/P AVR Results of the recent echocardiogram, ordered by Dr. Andee Lineman, were reviewed with the patient, and I reassured him that the mechanical aortic valve was functioning normally. Moreover, LVF is preserved, and there is no significant valvular disease. Patient remains on Coumadin anticoagulation, followed here in our clinic.  Essential hypertension, benign Well-controlled on current medication regimen     Gene Destyne Goodreau, PAC

## 2012-07-23 NOTE — Assessment & Plan Note (Signed)
Maintaining NSR as confirmed by EKG. Patient previously on amiodarone

## 2012-07-23 NOTE — Assessment & Plan Note (Signed)
Results of the recent echocardiogram, ordered by Dr. Andee Lineman, were reviewed with the patient, and I reassured him that the mechanical aortic valve was functioning normally. Moreover, LVF is preserved, and there is no significant valvular disease. Patient remains on Coumadin anticoagulation, followed here in our clinic.

## 2012-07-23 NOTE — Assessment & Plan Note (Signed)
Well-controlled on current medication regimen 

## 2012-07-23 NOTE — Assessment & Plan Note (Signed)
No further workup indicated. Patient presents with a variety of symptoms, including vague atypical CP with no strict correlation with exertion. Moreover, he was recently briefly hospitalized, with all troponins within NL limits. He has history of nonobstructive CAD by multiple previous catheterizations, most recently in 2010.

## 2012-07-23 NOTE — Patient Instructions (Signed)
Continue all current medications. Follow up in  4 months  

## 2012-07-24 ENCOUNTER — Ambulatory Visit (INDEPENDENT_AMBULATORY_CARE_PROVIDER_SITE_OTHER): Payer: Medicare Other | Admitting: *Deleted

## 2012-07-24 DIAGNOSIS — Z954 Presence of other heart-valve replacement: Secondary | ICD-10-CM

## 2012-07-24 DIAGNOSIS — Z952 Presence of prosthetic heart valve: Secondary | ICD-10-CM

## 2012-07-24 DIAGNOSIS — Z7901 Long term (current) use of anticoagulants: Secondary | ICD-10-CM

## 2012-07-24 LAB — POCT INR: INR: 3.8

## 2012-08-04 ENCOUNTER — Ambulatory Visit (INDEPENDENT_AMBULATORY_CARE_PROVIDER_SITE_OTHER): Payer: Medicare Other | Admitting: *Deleted

## 2012-08-04 DIAGNOSIS — Z7901 Long term (current) use of anticoagulants: Secondary | ICD-10-CM

## 2012-08-04 DIAGNOSIS — Z952 Presence of prosthetic heart valve: Secondary | ICD-10-CM

## 2012-08-04 DIAGNOSIS — Z954 Presence of other heart-valve replacement: Secondary | ICD-10-CM

## 2012-08-05 ENCOUNTER — Encounter (INDEPENDENT_AMBULATORY_CARE_PROVIDER_SITE_OTHER): Payer: Self-pay | Admitting: *Deleted

## 2012-08-06 ENCOUNTER — Telehealth: Payer: Self-pay | Admitting: Cardiology

## 2012-08-06 NOTE — Telephone Encounter (Signed)
Noted  

## 2012-08-06 NOTE — Telephone Encounter (Signed)
Patient called to request copies of his medical records, specific to surgery performed by Dr. Dorris Wright in May, 2013 and July, 2013. I explained to [name] that we can not release records from another doctor's office. I advised him to call Dr. Dorris Wright  office. He said I have called on several occasions. In looking at his notes I see that Dr. Dorris Wright office did mail his records to him. [name] states that things "were left out of his chart". I explained to him that our office can not do anything about another doctor's notes. I called [facility] office and was told that all medical records had been submitted.  I called [name] and he states that he feels like he is going to die. Complains of having to take to many medications and  stays short of breath. He states that he plans to call Dr. Reuel Wright office to see if they can see him soon.

## 2012-08-11 ENCOUNTER — Ambulatory Visit (INDEPENDENT_AMBULATORY_CARE_PROVIDER_SITE_OTHER): Payer: Medicare Other | Admitting: *Deleted

## 2012-08-11 ENCOUNTER — Ambulatory Visit (INDEPENDENT_AMBULATORY_CARE_PROVIDER_SITE_OTHER): Payer: Medicare Other | Admitting: Internal Medicine

## 2012-08-11 DIAGNOSIS — Z954 Presence of other heart-valve replacement: Secondary | ICD-10-CM

## 2012-08-11 DIAGNOSIS — Z952 Presence of prosthetic heart valve: Secondary | ICD-10-CM

## 2012-08-11 DIAGNOSIS — Z7901 Long term (current) use of anticoagulants: Secondary | ICD-10-CM

## 2012-08-11 LAB — POCT INR: INR: 1.8

## 2012-08-13 ENCOUNTER — Ambulatory Visit (INDEPENDENT_AMBULATORY_CARE_PROVIDER_SITE_OTHER): Payer: Medicare Other | Admitting: Internal Medicine

## 2012-08-17 ENCOUNTER — Ambulatory Visit (INDEPENDENT_AMBULATORY_CARE_PROVIDER_SITE_OTHER): Payer: Medicare Other | Admitting: Cardiology

## 2012-08-17 ENCOUNTER — Encounter: Payer: Self-pay | Admitting: Cardiology

## 2012-08-17 VITALS — BP 106/71 | HR 74 | Ht 72.0 in | Wt 209.0 lb

## 2012-08-17 DIAGNOSIS — I4891 Unspecified atrial fibrillation: Secondary | ICD-10-CM

## 2012-08-17 DIAGNOSIS — I2510 Atherosclerotic heart disease of native coronary artery without angina pectoris: Secondary | ICD-10-CM

## 2012-08-17 DIAGNOSIS — Z954 Presence of other heart-valve replacement: Secondary | ICD-10-CM

## 2012-08-17 DIAGNOSIS — E785 Hyperlipidemia, unspecified: Secondary | ICD-10-CM

## 2012-08-17 DIAGNOSIS — I719 Aortic aneurysm of unspecified site, without rupture: Secondary | ICD-10-CM

## 2012-08-17 DIAGNOSIS — Z952 Presence of prosthetic heart valve: Secondary | ICD-10-CM

## 2012-08-17 NOTE — Assessment & Plan Note (Signed)
History of bicuspid aortic valve with stenosis status post mechanical AVR 5/13. Recent followup echocardiogram shows stable prosthetic function as outlined. I discussed this with the patient today. He does complain of hearing his prosthesis, and I reassured him that many people eventually report becoming less aware of these sounds over time. Also stressed the importance of continued compliance with Coumadin and followup in the clinic.

## 2012-08-17 NOTE — Patient Instructions (Addendum)
Your physician recommends that you schedule a follow-up appointment in: 3 months. Your physician recommends that you continue on your current medications as directed. Please refer to the Current Medication list given to you today. 

## 2012-08-17 NOTE — Assessment & Plan Note (Signed)
Nonobstructive disease documented by prior cardiac catheterization.

## 2012-08-17 NOTE — Assessment & Plan Note (Addendum)
Status post emergent thoracic aortic aneurysm repair by Dr. Dorris Fetch 7/13. Ultimately will consider followup chest CT for surveillance, however not at this point.

## 2012-08-17 NOTE — Assessment & Plan Note (Signed)
No obvious recurrences. ECG shows sinus rhythm with right bundle branch block today.

## 2012-08-17 NOTE — Progress Notes (Signed)
Clinical Summary Mr. Charrette is a medically complex 62 y.o.male recently seen in the office by Mr. Serpe PAC in late April. I last saw him in February, and it sounds like he actually saw Dr. Andee Lineman (at that point with [facility]) at some point in the interim, although I do not have those records at hand.  Mr. Weatherall does not report any definite, new symptomatology. He describes occasional sense of forceful heartbeat, hearing his mechanical heart valve, has occasional paresthesias diffusely without specific pattern, also weak and chronically short of breath. The specific reason for his visit today was not entirely evident in speaking with him, however eventually it became clear that he was mainly interested in discussing his overall condition. He tells me that he has obtained records from [facility] and also [facility] hospital regarding his evaluations and surgeries over the last year. He also tells me that he has an attorney and eventually plans litigation regarding his cardiothoracic surgeon.  Echocardiogram (ordered by Dr. Andee Lineman) in March of this year demonstrated mild LVH with LVEF 50-55%, septal motion consistent with postoperative state, mid to basal inferoseptal hypokinesis, well-seated mechanical aortic prosthesis with mean gradient 7 mm mercury, no significant mitral regurgitation, trace tricuspid regurgitation with RVSP 25-30 mm mercury, no pericardial effusion, mildly dilated ascending aorta.  ECG today shows sinus rhythm with old right bundle branch block.  I initially met Mr. Keithly in the office back in November 2013 after his surgeries, following previous care by Dr. Andee Lineman. We have discussed his cardiac condition based on record review including bicuspid aortic valve with mechanical AVR, subsequent ruptured aortic aneurysm/pseudoaneurysm repair, and previous documentation of nonobstructive CAD. We discussed these issues again today, and I reviewed his echocardiogram report done  just recently with him. He reports compliance with his medications. He continues to follow in the Coumadin clinic.   No Known Allergies  Current Outpatient Prescriptions  Medication Sig Dispense Refill  . ALPRAZolam (XANAX) 1 MG tablet Take 1 mg by mouth 3 (three) times daily as needed.       [name] atorvastatin (LIPITOR) 40 MG tablet Take 40 mg by mouth daily.       [name] levothyroxine (SYNTHROID, LEVOTHROID) 50 MCG tablet Take 50 mcg by mouth daily.      [name] loratadine (CLARITIN) 10 MG tablet Take 10 mg by mouth daily.      . metoprolol tartrate (LOPRESSOR) 25 MG tablet Take 1 tablet (25 mg total) by mouth 2 (two) times daily.  60 tablet  1  . NITROSTAT 0.4 MG SL tablet Place 0.4 mg under the tongue every 5 (five) minutes as needed for chest pain.       . pantoprazole (PROTONIX) 40 MG tablet Take 40 mg by mouth daily.        . promethazine (PHENERGAN) 25 MG tablet Take 25 mg by mouth every 6 (six) hours as needed. For nausea      . ramipril (ALTACE) 2.5 MG capsule Take 1 capsule (2.5 mg total) by mouth daily.  90 capsule  3  . venlafaxine (EFFEXOR-XR) 150 MG 24 hr capsule Take 150 mg by mouth 2 (two) times daily.       [name] warfarin (COUMADIN) 2 MG tablet Take 1 1/2 tablets daily except 1 tablet on Sundays  100 tablet  1   No current facility-administered medications for this visit.    Past Medical History  Diagnosis Date  . Hyperlipidemia   . Aortic stenosis  Status post  5/13  . Coronary atherosclerosis of native coronary artery     Nonobstructive  . Dyslipidemia   . Fibromyalgia   . Peripheral vascular disease   . Myocardial infarction 10 yrs ago  . Tinnitus     Following prior seizures  . Seizures     6 yrs ago from withdrawal of Xanax to quickly.  . Long-term memory loss   . Stroke   . Arthritis   . Chronic back pain     Lumbar spondylosis  . GERD (gastroesophageal reflux disease)   . Hx of colonic polyps   . Nocturia   . Major depressive disorder     Prior suicidal attempts    . Anxiety   . Thoracic aortic aneurysm     Status post repair 7/13  . Atrial fibrillation     Past Surgical History  Procedure Laterality Date  . Inguinal exploration      right  . Femoral hernia repair      x2  . Umbilical hernia repair      [facility]  . Finger contracture release      Tendon release operation on his right little finger  . Appendectomy    . Cholecystectomy      [facility]  . Incisional hernia repair  05/01/2011    Procedure: HERNIA REPAIR INCISIONAL;  Surgeon: Dalia Heading, MD;  Location: [facility];  Service: General;  Laterality: N/A;  with Mesh  . Tonsillectomy      at age 35  . Circumcision    . Cardiac catheterization    . Colonoscopy    . Esophagogastroduodenoscopy  04/07/08/13  . Aortic valve replacement  08/15/2011    Procedure: AORTIC VALVE REPLACEMENT (AVR);  Surgeon: Loreli Slot, MD;  Location[facility] OR;  Service: Open Heart Surgery;  Laterality: N/A;  . Sternotomy  10/07/2011    Procedure: STERNOTOMY;  Surgeon: Loreli Slot, MD;  Location: [facility] OR;  Service: Open Heart Surgery;  Laterality: N/A;  . Thoracic aortic aneurysm repair  10/07/2011    Procedure: THORACIC ASCENDING ANEURYSM REPAIR (AAA);  Surgeon: Loreli Slot, MD;  Location: [facility] OR;  Service: Open Heart Surgery;  Laterality: N/A;  . Mediastinal exploration  10/08/2011    Procedure: MEDIASTINAL EXPLORATION;  Surgeon: Loreli Slot, MD;  Location: [facility] OR;  Service: Open Heart Surgery;  Laterality: N/A;    Social History Mr. Cowley reports that he has been smoking Cigarettes.  He has a 24 pack-year smoking history. He has never used smokeless tobacco. Mr. Kunkle reports that he does not drink alcohol.  Review of Systems Occasional "heart skips," no syncope. Reports chronic problems with memory, anhedonia. Also as outlined above.  Physical Examination Filed Vitals:   08/17/12 1334  BP: 106/71  Pulse: 74   Filed Weights   08/17/12 1334  Weight: 209 lb (94.802 kg)    No acute  distress.  HEENT: Conjunctiva and lids normal, oropharynx clear.  Neck: Supple, no elevated JVP or carotid bruits, no thyromegaly.  Lungs: Clear to auscultation, nonlabored breathing at rest.  Cardiac: Regular rate and rhythm, loud crisp prosthetic valve click in S2, no S3, soft systolic murmur, no pericardial rub.  Abdomen: Soft, protuberant, nontender, bowel sounds present. Surgical scars well-healed.  Extremities: No pitting edema, distal pulses 2+.  Skin: Warm and dry. No ecchymoses. Musculoskeletal: No kyphosis. Neuropsychiatric: Alert and oriented x3, somewhat tangential historian with slow deliberate speech.   Problem List and Plan   S/P AVR  History of bicuspid aortic valve with stenosis status post mechanical AVR 5/13. Recent followup echocardiogram shows stable prosthetic function as outlined. I discussed this with the patient today. He does complain of hearing his prosthesis, and I reassured him that many people eventually report becoming less aware of these sounds over time. Also stressed the importance of continued compliance with Coumadin and followup in the clinic.  Atrial fibrillation No obvious recurrences. ECG shows sinus rhythm with right bundle branch block today.  Coronary atherosclerosis of native coronary artery Nonobstructive disease documented by prior cardiac catheterization.  HYPERLIPIDEMIA-MIXED He continues on Lipitor, reports regular followup with Dr. [name].  Aortic aneurysm, including pseudoaneurysm Status post emergent thoracic aortic aneurysm repair by Dr. Dorris Fetch 7/13. Ultimately will consider followup chest CT for surveillance, however not at this point.    Jonelle Sidle, M.D., F.A.C.C.

## 2012-08-17 NOTE — Assessment & Plan Note (Signed)
He continues on Lipitor, reports regular followup with Dr. [name].

## 2012-08-25 ENCOUNTER — Ambulatory Visit (INDEPENDENT_AMBULATORY_CARE_PROVIDER_SITE_OTHER): Payer: Medicare Other | Admitting: *Deleted

## 2012-08-25 DIAGNOSIS — Z954 Presence of other heart-valve replacement: Secondary | ICD-10-CM

## 2012-08-25 DIAGNOSIS — Z952 Presence of prosthetic heart valve: Secondary | ICD-10-CM

## 2012-08-25 DIAGNOSIS — Z7901 Long term (current) use of anticoagulants: Secondary | ICD-10-CM

## 2012-08-25 LAB — POCT INR: INR: 1.7

## 2012-09-01 ENCOUNTER — Telehealth: Payer: Self-pay | Admitting: Cardiology

## 2012-09-01 NOTE — Telephone Encounter (Signed)
Pt Signed ROI, [facility] Cardiac records mailed to pt Home Address  09/01/12/KM

## 2012-09-08 ENCOUNTER — Ambulatory Visit (INDEPENDENT_AMBULATORY_CARE_PROVIDER_SITE_OTHER): Payer: Medicare Other | Admitting: *Deleted

## 2012-09-08 DIAGNOSIS — Z954 Presence of other heart-valve replacement: Secondary | ICD-10-CM

## 2012-09-08 DIAGNOSIS — Z7901 Long term (current) use of anticoagulants: Secondary | ICD-10-CM

## 2012-09-08 DIAGNOSIS — Z952 Presence of prosthetic heart valve: Secondary | ICD-10-CM

## 2012-09-08 LAB — POCT INR: INR: 1.6

## 2012-09-09 ENCOUNTER — Other Ambulatory Visit: Payer: Self-pay | Admitting: Cardiology

## 2012-09-10 ENCOUNTER — Ambulatory Visit (INDEPENDENT_AMBULATORY_CARE_PROVIDER_SITE_OTHER): Payer: Medicare Other | Admitting: Internal Medicine

## 2012-09-10 NOTE — Telephone Encounter (Signed)
Medication sent via escribe.  

## 2012-09-14 ENCOUNTER — Ambulatory Visit (INDEPENDENT_AMBULATORY_CARE_PROVIDER_SITE_OTHER): Payer: Medicare Other | Admitting: Internal Medicine

## 2012-09-22 ENCOUNTER — Ambulatory Visit (INDEPENDENT_AMBULATORY_CARE_PROVIDER_SITE_OTHER): Payer: Medicare Other | Admitting: *Deleted

## 2012-09-22 DIAGNOSIS — Z952 Presence of prosthetic heart valve: Secondary | ICD-10-CM

## 2012-09-22 DIAGNOSIS — Z954 Presence of other heart-valve replacement: Secondary | ICD-10-CM

## 2012-09-22 DIAGNOSIS — Z7901 Long term (current) use of anticoagulants: Secondary | ICD-10-CM

## 2012-10-09 ENCOUNTER — Ambulatory Visit (INDEPENDENT_AMBULATORY_CARE_PROVIDER_SITE_OTHER): Payer: Medicare Other | Admitting: *Deleted

## 2012-10-09 DIAGNOSIS — Z7901 Long term (current) use of anticoagulants: Secondary | ICD-10-CM

## 2012-10-09 DIAGNOSIS — Z952 Presence of prosthetic heart valve: Secondary | ICD-10-CM

## 2012-10-09 DIAGNOSIS — Z954 Presence of other heart-valve replacement: Secondary | ICD-10-CM

## 2012-10-09 LAB — POCT INR: INR: 1.6

## 2012-10-23 ENCOUNTER — Ambulatory Visit (INDEPENDENT_AMBULATORY_CARE_PROVIDER_SITE_OTHER): Payer: Medicare Other | Admitting: *Deleted

## 2012-10-23 DIAGNOSIS — Z954 Presence of other heart-valve replacement: Secondary | ICD-10-CM

## 2012-10-23 DIAGNOSIS — Z7901 Long term (current) use of anticoagulants: Secondary | ICD-10-CM

## 2012-10-23 DIAGNOSIS — Z952 Presence of prosthetic heart valve: Secondary | ICD-10-CM

## 2012-10-23 LAB — POCT INR: INR: 2.1

## 2012-11-10 ENCOUNTER — Ambulatory Visit: Payer: Medicare Other | Admitting: Cardiology

## 2012-11-23 ENCOUNTER — Ambulatory Visit (INDEPENDENT_AMBULATORY_CARE_PROVIDER_SITE_OTHER): Payer: Medicare Other | Admitting: Cardiology

## 2012-11-23 ENCOUNTER — Encounter: Payer: Self-pay | Admitting: Cardiology

## 2012-11-23 ENCOUNTER — Ambulatory Visit (INDEPENDENT_AMBULATORY_CARE_PROVIDER_SITE_OTHER): Payer: Medicare Other | Admitting: *Deleted

## 2012-11-23 ENCOUNTER — Telehealth: Payer: Self-pay | Admitting: *Deleted

## 2012-11-23 VITALS — BP 107/71 | HR 76 | Ht 72.0 in | Wt 214.0 lb

## 2012-11-23 DIAGNOSIS — I359 Nonrheumatic aortic valve disorder, unspecified: Secondary | ICD-10-CM

## 2012-11-23 DIAGNOSIS — Z7901 Long term (current) use of anticoagulants: Secondary | ICD-10-CM

## 2012-11-23 DIAGNOSIS — I350 Nonrheumatic aortic (valve) stenosis: Secondary | ICD-10-CM

## 2012-11-23 DIAGNOSIS — R002 Palpitations: Secondary | ICD-10-CM | POA: Insufficient documentation

## 2012-11-23 DIAGNOSIS — I2510 Atherosclerotic heart disease of native coronary artery without angina pectoris: Secondary | ICD-10-CM

## 2012-11-23 DIAGNOSIS — Z952 Presence of prosthetic heart valve: Secondary | ICD-10-CM

## 2012-11-23 DIAGNOSIS — I719 Aortic aneurysm of unspecified site, without rupture: Secondary | ICD-10-CM

## 2012-11-23 DIAGNOSIS — Z954 Presence of other heart-valve replacement: Secondary | ICD-10-CM

## 2012-11-23 LAB — POCT INR: INR: 3.2

## 2012-11-23 NOTE — Progress Notes (Signed)
Clinical Summary [name] is a medically complex 62 y.o.male last seen in May of this year, a prior patient of Dr. Andee Lineman. Previous office note reviewed. He states that he keeps regular 3 month visits with Dr. Garner Nash. Reports multiple somatic complaints - feels heartbeat in his head, experiences daily palpitations as if his heart is "racing," feels depressed and anxious, chronically weak, feeling of swelling in his hands and feet, chronic back and leg pain.  Echocardiogram in March of this year demonstrated mild LVH with LVEF 50-55%, septal motion consistent with postoperative state, mid to basal inferoseptal hypokinesis, well-seated mechanical aortic prosthesis with mean gradient 7 mm mercury, no significant mitral regurgitation, trace tricuspid regurgitation with RVSP 25-30 mm mercury, no pericardial effusion, mildly dilated ascending aorta.  No recent documentation of atrial fibrillation by serial ECGs. He states he has been taking his medications regularly. He did miss his most recent Coumadin clinic visit. Last INR was 2.1. He denies any bleeding problems.   No Known Allergies  Current Outpatient Prescriptions  Medication Sig Dispense Refill  . ALPRAZolam (XANAX) 1 MG tablet Take 1 mg by mouth 5 (five) times daily as needed.       [name] atorvastatin (LIPITOR) 40 MG tablet Take 40 mg by mouth daily.       [name] levothyroxine (SYNTHROID, LEVOTHROID) 50 MCG tablet Take 50 mcg by mouth daily.      [name] loratadine (CLARITIN) 10 MG tablet Take 10 mg by mouth daily.      . metoprolol tartrate (LOPRESSOR) 25 MG tablet Take 1 tablet (25 mg total) by mouth 2 (two) times daily.  60 tablet  1  . NITROSTAT 0.4 MG SL tablet Place 0.4 mg under the tongue every 5 (five) minutes as needed for chest pain.       . pantoprazole (PROTONIX) 40 MG tablet Take 40 mg by mouth daily.        . promethazine (PHENERGAN) 25 MG tablet Take 25 mg by mouth every 6 (six) hours as needed. For nausea      . ramipril (ALTACE) 2.5 MG  capsule Take 1 capsule (2.5 mg total) by mouth daily.  90 capsule  3  . venlafaxine (EFFEXOR-XR) 150 MG 24 hr capsule Take 150 mg by mouth 2 (two) times daily.       [name] warfarin (COUMADIN) 2 MG tablet TAKE ONE & ONE-HALF (1 & 1/2) TABLETS BY MOUTH EVERY DAY EXCEPT ONE (1) TABLET ON SUNDAYS  100 tablet  3  . HYDROcodone-acetaminophen (NORCO/VICODIN) 5-325 MG per tablet Take 1 tablet by mouth 3 (three) times daily as needed.       No current facility-administered medications for this visit.    Past Medical History  Diagnosis Date  . Hyperlipidemia   . Aortic stenosis     Status post AVR 5/13  . Coronary atherosclerosis of native coronary artery     Nonobstructive  . Dyslipidemia   . Fibromyalgia   . Peripheral vascular disease   . Myocardial infarction 10 yrs ago  . Tinnitus     Following prior seizures  . Seizures     6 yrs ago from withdrawal of Xanax to quickly.  . Long-term memory loss   . Stroke   . Arthritis   . Chronic back pain     Lumbar spondylosis  . GERD (gastroesophageal reflux disease)   . Hx of colonic polyps   . Nocturia   . Major depressive disorder     Prior suicidal attempts  .  Anxiety   . Thoracic aortic aneurysm     Status post repair 7/13  . Atrial fibrillation     Past Surgical History  Procedure Laterality Date  . Inguinal exploration      Right  . Femoral hernia repair      x2  . Umbilical hernia repair      [facility]  . Finger contracture release      Tendon release operation on his right little finger  . Appendectomy    . Cholecystectomy      [facility]  . Incisional hernia repair  05/01/2011    Procedure: HERNIA REPAIR INCISIONAL;  Surgeon: Dalia Heading, MD;  Location: [facility];  Service: General;  Laterality: N/A;  with Mesh  . Tonsillectomy      at age 82  . Circumcision    . Cardiac catheterization    . Colonoscopy    . Esophagogastroduodenoscopy  04/07/08/13  . Aortic valve replacement  08/15/2011    Procedure: AORTIC VALVE REPLACEMENT (AVR);   Surgeon: Loreli Slot, MD;  Location: [facility] OR;  Service: Open Heart Surgery;  Laterality: N/A;  . Sternotomy  10/07/2011    Procedure: STERNOTOMY;  Surgeon: Loreli Slot, MD;  Location: [facility] OR;  Service: Open Heart Surgery;  Laterality: N/A;  . Thoracic aortic aneurysm repair  10/07/2011    Procedure: THORACIC ASCENDING ANEURYSM REPAIR (AAA);  Surgeon: Loreli Slot, MD;  Location: [facility] OR;  Service: Open Heart Surgery;  Laterality: N/A;  . Mediastinal exploration  10/08/2011    Procedure: MEDIASTINAL EXPLORATION;  Surgeon: Loreli Slot, MD;  Location: [facility] OR;  Service: Open Heart Surgery;  Laterality: N/A;    Social History Mr. Morocho reports that he has been smoking Cigarettes.  He has a 24 pack-year smoking history. He has never used smokeless tobacco. Mr. Jachim reports that he does not drink alcohol.  Review of Systems As outlined above.  Physical Examination Filed Vitals:   11/23/12 1421  BP: 107/71  Pulse: 76   Filed Weights   11/23/12 1421  Weight: 214 lb (97.07 kg)    No distress. HEENT: Conjunctiva and lids normal, oropharynx clear.  Neck: Supple, no elevated JVP or carotid bruits, no thyromegaly.  Lungs: Clear to auscultation, nonlabored breathing at rest.  Cardiac: Regular rate and rhythm, loud crisp prosthetic valve click in S2, no S3, soft systolic murmur, no pericardial rub.  Abdomen: Soft, protuberant, nontender, bowel sounds present. Surgical scars well-healed.  Extremities: Trace ankle edema, mild stasis changes,, distal pulses 2+.  Skin: Warm and dry. No ecchymoses.     Problem List and Plan   Palpitations As noted above, he has not had any recent documentation of atrial fibrillation. Reports a sense of rapid heart rate on daily basis. 48 hour Holter monitor to be obtained to assess for paroxysmal arrhythmia.  Coronary atherosclerosis of native coronary artery No angina symptoms with history of nonobstructive disease.  Aortic  stenosis Status post mechanical AVR as outlined. Continues on Coumadin. Stressed importance of regular followup in the Coumadin clinic.  Aortic aneurysm, including pseudoaneurysm Status post emergent thoracic aortic aneurysm repair by Dr. Dorris Fetch 7/13. Will consider followup chest CT around time of next visit.     Jonelle Sidle, M.D., F.A.C.C.

## 2012-11-23 NOTE — Assessment & Plan Note (Signed)
Status post mechanical AVR as outlined. Continues on Coumadin. Stressed importance of regular followup in the Coumadin clinic.

## 2012-11-23 NOTE — Assessment & Plan Note (Signed)
No angina symptoms with history of nonobstructive disease. 

## 2012-11-23 NOTE — Telephone Encounter (Signed)
Message copied by Jeannine Kitten on Mon Nov 23, 2012  4:35 PM ------      Message from: Eustace Moore      Created: Mon Nov 23, 2012  2:55 PM      Regarding: INR TODAY IS 3.2       PATIENT WAS SCHEDULED FOR TOMORROW BUT REQUESTED IT TODAY            NO CHANGED IN DIET OR MEDS      NO MISSED DOSES      ALL NEGATIVES ------

## 2012-11-23 NOTE — Telephone Encounter (Signed)
Will call and dose coumadin

## 2012-11-23 NOTE — Telephone Encounter (Signed)
Message copied by Jeannine Kitten on Mon Nov 23, 2012  4:20 PM ------      Message from: Eustace Moore      Created: Mon Nov 23, 2012  2:55 PM      Regarding: INR TODAY IS 3.2       PATIENT WAS SCHEDULED FOR TOMORROW BUT REQUESTED IT TODAY            NO CHANGED IN DIET OR MEDS      NO MISSED DOSES      ALL NEGATIVES ------

## 2012-11-23 NOTE — Assessment & Plan Note (Signed)
Status post emergent thoracic aortic aneurysm repair by Dr. Dorris Fetch 7/13. Will consider followup chest CT around time of next visit.

## 2012-11-23 NOTE — Assessment & Plan Note (Signed)
As noted above, he has not had any recent documentation of atrial fibrillation. Reports a sense of rapid heart rate on daily basis. 48 hour Holter monitor to be obtained to assess for paroxysmal arrhythmia.

## 2012-11-23 NOTE — Patient Instructions (Signed)
Your physician recommends that you schedule a follow-up appointment in: 3 months. Your physician recommends that you continue on your current medications as directed. Please refer to the Current Medication list given to you today. Your physician has recommended that you wear a 48 holter monitor. Holter monitors are medical devices that record the heart's electrical activity. Doctors most often use these monitors to diagnose arrhythmias. Arrhythmias are problems with the speed or rhythm of the heartbeat. The monitor is a small, portable device. You can wear one while you do your normal daily activities. This is usually used to diagnose what is causing palpitations/syncope (passing out).

## 2012-12-01 ENCOUNTER — Ambulatory Visit (HOSPITAL_COMMUNITY): Admission: RE | Admit: 2012-12-01 | Payer: Medicare Other | Source: Ambulatory Visit

## 2012-12-01 ENCOUNTER — Ambulatory Visit (INDEPENDENT_AMBULATORY_CARE_PROVIDER_SITE_OTHER): Payer: Medicare Other | Admitting: *Deleted

## 2012-12-01 DIAGNOSIS — Z954 Presence of other heart-valve replacement: Secondary | ICD-10-CM

## 2012-12-01 DIAGNOSIS — Z952 Presence of prosthetic heart valve: Secondary | ICD-10-CM

## 2012-12-01 DIAGNOSIS — Z7901 Long term (current) use of anticoagulants: Secondary | ICD-10-CM

## 2012-12-08 ENCOUNTER — Ambulatory Visit (HOSPITAL_COMMUNITY)
Admission: RE | Admit: 2012-12-08 | Discharge: 2012-12-08 | Disposition: A | Discharge status: Home / Self Care | Payer: Medicare Other | Source: Ambulatory Visit | Attending: Cardiology | Admitting: Cardiology

## 2012-12-08 DIAGNOSIS — R000 Tachycardia, unspecified: Secondary | ICD-10-CM | POA: Insufficient documentation

## 2012-12-08 DIAGNOSIS — R002 Palpitations: Secondary | ICD-10-CM

## 2012-12-08 DIAGNOSIS — R0602 Shortness of breath: Secondary | ICD-10-CM | POA: Insufficient documentation

## 2012-12-08 NOTE — Progress Notes (Signed)
Holter Monitor 48 hours in progress.

## 2012-12-15 ENCOUNTER — Ambulatory Visit (INDEPENDENT_AMBULATORY_CARE_PROVIDER_SITE_OTHER): Payer: Medicare Other | Admitting: *Deleted

## 2012-12-15 DIAGNOSIS — Z7901 Long term (current) use of anticoagulants: Secondary | ICD-10-CM

## 2012-12-15 DIAGNOSIS — Z954 Presence of other heart-valve replacement: Secondary | ICD-10-CM

## 2012-12-15 DIAGNOSIS — Z952 Presence of prosthetic heart valve: Secondary | ICD-10-CM

## 2012-12-29 ENCOUNTER — Ambulatory Visit (INDEPENDENT_AMBULATORY_CARE_PROVIDER_SITE_OTHER): Payer: Medicare Other | Admitting: *Deleted

## 2012-12-29 DIAGNOSIS — Z954 Presence of other heart-valve replacement: Secondary | ICD-10-CM

## 2012-12-29 DIAGNOSIS — Z952 Presence of prosthetic heart valve: Secondary | ICD-10-CM

## 2012-12-29 DIAGNOSIS — Z7901 Long term (current) use of anticoagulants: Secondary | ICD-10-CM

## 2013-01-04 ENCOUNTER — Encounter (INDEPENDENT_AMBULATORY_CARE_PROVIDER_SITE_OTHER): Payer: Self-pay | Admitting: *Deleted

## 2013-01-07 ENCOUNTER — Telehealth: Payer: Self-pay | Admitting: *Deleted

## 2013-01-07 NOTE — Telephone Encounter (Signed)
Message copied by Eustace Moore on Thu Jan 07, 2013  2:03 PM ------      Message from: Jonelle Sidle      Created: Mon Jan 04, 2013  3:15 PM       Reviewed report. Rhythm is sinus with heart rate ranges as noted. No significant pauses, only occasional PACs and PVCs with no sustained arrhythmias. Would recommend continuing medical therapy at this point. ------

## 2013-01-07 NOTE — Telephone Encounter (Signed)
Patient informed. 

## 2013-01-08 ENCOUNTER — Telehealth: Payer: Self-pay | Admitting: Cardiology

## 2013-01-08 ENCOUNTER — Other Ambulatory Visit (INDEPENDENT_AMBULATORY_CARE_PROVIDER_SITE_OTHER): Payer: Self-pay | Admitting: *Deleted

## 2013-01-08 ENCOUNTER — Encounter (INDEPENDENT_AMBULATORY_CARE_PROVIDER_SITE_OTHER): Payer: Self-pay | Admitting: *Deleted

## 2013-01-08 ENCOUNTER — Telehealth (INDEPENDENT_AMBULATORY_CARE_PROVIDER_SITE_OTHER): Payer: Self-pay | Admitting: *Deleted

## 2013-01-08 DIAGNOSIS — Z1211 Encounter for screening for malignant neoplasm of colon: Secondary | ICD-10-CM

## 2013-01-08 MED ORDER — PEG-KCL-NACL-NASULF-NA ASC-C 100 G PO SOLR: 1.0000 | Freq: Once | ORAL | Status: DC

## 2013-01-08 NOTE — Telephone Encounter (Signed)
Bridge with Lovenox for mechanical AVR?

## 2013-01-08 NOTE — Telephone Encounter (Signed)
Yes, with history of mechanical AVR and previously documented atrial fibrillation, would was Lovenox.

## 2013-01-08 NOTE — Telephone Encounter (Signed)
[name] is scheduled for colonoscopy on 03-18-13 with Dr. Karilyn Cota. Needs to know if patient can come off Coumdin for 5 days.

## 2013-01-08 NOTE — Telephone Encounter (Signed)
[name] will need to be bridged with Lovenox

## 2013-01-08 NOTE — Telephone Encounter (Signed)
Patient needs movi prep 

## 2013-01-12 ENCOUNTER — Telehealth: Payer: Self-pay | Admitting: Cardiology

## 2013-01-12 ENCOUNTER — Ambulatory Visit (INDEPENDENT_AMBULATORY_CARE_PROVIDER_SITE_OTHER): Payer: Medicare Other | Admitting: *Deleted

## 2013-01-12 DIAGNOSIS — Z7901 Long term (current) use of anticoagulants: Secondary | ICD-10-CM

## 2013-01-12 DIAGNOSIS — Z954 Presence of other heart-valve replacement: Secondary | ICD-10-CM

## 2013-01-12 DIAGNOSIS — Z952 Presence of prosthetic heart valve: Secondary | ICD-10-CM

## 2013-01-12 LAB — POCT INR: INR: 2.1

## 2013-01-12 NOTE — Telephone Encounter (Signed)
I am seeing him today.  I will take care of his lovenox bridging.

## 2013-01-12 NOTE — Telephone Encounter (Signed)
Thank you, if you need anything from our office just let me know

## 2013-01-12 NOTE — Telephone Encounter (Signed)
Patient feels like his results were not correct and wants to know if batteries were left out

## 2013-01-12 NOTE — Telephone Encounter (Signed)
[name] Is this something cardiologist will handle? Thanks

## 2013-01-14 NOTE — Telephone Encounter (Signed)
Spoke with patient and he questioned the accuracy of the monitor he recently wore. Nurse advised him that the monitor wouldn't have produced results without batteries. Patient c/o that because of his heart "feeling like its beating all over the place", he didn't think it was correct. Nurse advised him that PVC's and PAC's showed occasionally and that is what he may have been feeling. Patient also stated that he still has some SOB. Nurse advised patient to delete any caffeine from his diet and also informed patient that results will be reviewed during his upcoming office visit with cardiologist.

## 2013-01-29 ENCOUNTER — Ambulatory Visit (INDEPENDENT_AMBULATORY_CARE_PROVIDER_SITE_OTHER): Payer: Medicare Other | Admitting: *Deleted

## 2013-01-29 DIAGNOSIS — Z954 Presence of other heart-valve replacement: Secondary | ICD-10-CM

## 2013-01-29 DIAGNOSIS — Z7901 Long term (current) use of anticoagulants: Secondary | ICD-10-CM

## 2013-01-29 DIAGNOSIS — Z952 Presence of prosthetic heart valve: Secondary | ICD-10-CM

## 2013-01-29 LAB — POCT INR: INR: 2.3

## 2013-02-03 ENCOUNTER — Encounter: Payer: Self-pay | Admitting: Cardiology

## 2013-02-03 ENCOUNTER — Encounter: Payer: Medicare Other | Admitting: Cardiology

## 2013-02-03 NOTE — Progress Notes (Signed)
No show  This encounter was created in error - please disregard.

## 2013-02-04 ENCOUNTER — Other Ambulatory Visit: Payer: Self-pay

## 2013-02-17 ENCOUNTER — Telehealth (INDEPENDENT_AMBULATORY_CARE_PROVIDER_SITE_OTHER): Payer: Self-pay | Admitting: *Deleted

## 2013-02-17 ENCOUNTER — Encounter (INDEPENDENT_AMBULATORY_CARE_PROVIDER_SITE_OTHER): Payer: Self-pay | Admitting: *Deleted

## 2013-02-17 NOTE — Telephone Encounter (Signed)
  Procedure: tcs  Reason/Indication:  screening  Has patient had this procedure before?  Yes, 10 yrs ago  If so, when, by whom and where?    Is there a family history of colon cancer?  no  Who?  What age when diagnosed?    Is patient diabetic?   no      Does patient have prosthetic heart valve?  no  Do you have a pacemaker?  no  Has patient ever had endocarditis? no  Has patient had joint replacement within last 12 months?  no  Does patient tend to be constipated or take laxatives? sometimes  Is patient on Coumadin, Plavix and/or Aspirin? yes  Medications: coumadin 2 mg 1 1/2 tab daily, venlafaxine 150 mg bid, levothyroxine 0.05 mg daily, metoprolol 25 mg bid, pantoprazole 40 mg daily, promethazine 25 gm prn for nausea, ramipril 2.5 mg daily, atorvastatin 40 mg daily, hydrocodone 5/325 mg tid, alprazolam 1 mg five pills daily  Allergies: nkda  Medication Adjustment: coumadin 5 days  Procedure date & time: 03/18/13 at 730

## 2013-02-17 NOTE — Progress Notes (Signed)
This encounter was created in error - please disregard.

## 2013-02-17 NOTE — Telephone Encounter (Signed)
agree

## 2013-02-19 ENCOUNTER — Ambulatory Visit (INDEPENDENT_AMBULATORY_CARE_PROVIDER_SITE_OTHER): Payer: Medicare Other | Admitting: *Deleted

## 2013-02-19 DIAGNOSIS — Z7901 Long term (current) use of anticoagulants: Secondary | ICD-10-CM

## 2013-02-19 DIAGNOSIS — Z952 Presence of prosthetic heart valve: Secondary | ICD-10-CM

## 2013-02-19 DIAGNOSIS — Z954 Presence of other heart-valve replacement: Secondary | ICD-10-CM

## 2013-02-24 ENCOUNTER — Ambulatory Visit: Payer: Medicare Other | Admitting: Cardiology

## 2013-03-01 ENCOUNTER — Other Ambulatory Visit: Payer: Self-pay | Admitting: Cardiology

## 2013-03-09 ENCOUNTER — Encounter (HOSPITAL_COMMUNITY): Payer: Self-pay | Admitting: Pharmacy Technician

## 2013-03-12 ENCOUNTER — Ambulatory Visit (INDEPENDENT_AMBULATORY_CARE_PROVIDER_SITE_OTHER): Payer: Medicare Other | Admitting: *Deleted

## 2013-03-12 DIAGNOSIS — Z954 Presence of other heart-valve replacement: Secondary | ICD-10-CM

## 2013-03-12 DIAGNOSIS — Z7901 Long term (current) use of anticoagulants: Secondary | ICD-10-CM

## 2013-03-12 DIAGNOSIS — Z952 Presence of prosthetic heart valve: Secondary | ICD-10-CM

## 2013-03-12 LAB — POCT INR: INR: 2.8

## 2013-03-12 MED ORDER — ENOXAPARIN SODIUM 100 MG/ML ~~LOC~~ SOLN: 100.0000 mg | Freq: Two times a day (BID) | SUBCUTANEOUS | Status: DC

## 2013-03-12 NOTE — Patient Instructions (Signed)
03/12/13  Take last dose of coumadin (1/2 tablet) 03/13/13  No Lovenox or coumadin 03/14/13  Lovenox 100mg  SQ 9am & 9pm 03/15/13  Lovenox 100mg  SQ 9am & 9pm 03/16/13  Lovenox 100mg  SQ 9am & 9pm 03/17/13  Lovenox 100mg  SQ 9am.  No dose in pm because procedure scheduled for 6am. 03/18/13  No lovenox in am----colonoscopy--------coumadin 2 tablets 6pm & lovenox 100mg  sq 9pm 03/19/13  Lovenox 100mg  SQ 9am & coumadin 2 tablets 6pm and lovenox 100mg  9pm 03/20/13  Lovenox 100mg  SQ 9am & coumadin 2 tablets 6pm and lovenox 100mg  9pm 03/21/13  Lovenox 100mg  SQ 9am & coumadin 1 1/2 tablets 6pm and lovenox 100mg  9pm 03/22/13  Lovenox 100mg  SQ 9am & coumadin 1 1/2 tablets 6pm and lovenox 100mg  9pm 03/23/13  Lovenox 100mg  SQ 9am & appt @

## 2013-03-15 ENCOUNTER — Telehealth: Payer: Self-pay | Admitting: *Deleted

## 2013-03-15 ENCOUNTER — Ambulatory Visit: Payer: Medicare Other | Admitting: Cardiology

## 2013-03-15 NOTE — Telephone Encounter (Signed)
[name] called to cancel his colonoscopy. Gave him the number to call Dr. Patty Sermons office. States that he needs to back on his coumdin. Please call patient regarding his coumdin.

## 2013-03-16 NOTE — Telephone Encounter (Signed)
Spoke with pt.  States he canceled colonoscopy because he is sick.  Has appt with Dr [name] Monday.  He missed 1 dose of coumadin and never started Lovenox.  Told pt to continue current dose of coumadin and come for INR check on 12/23 as ordered.  Pt verbalized understandng.

## 2013-03-18 ENCOUNTER — Ambulatory Visit: Admit: 2013-03-18 | Payer: Self-pay | Admitting: Internal Medicine

## 2013-03-18 ENCOUNTER — Encounter (HOSPITAL_COMMUNITY): Admission: RE | Payer: Self-pay | Source: Ambulatory Visit

## 2013-03-18 ENCOUNTER — Ambulatory Visit (HOSPITAL_COMMUNITY): Admission: RE | Admit: 2013-03-18 | Payer: Medicare Other | Source: Ambulatory Visit | Admitting: Internal Medicine

## 2013-03-18 SURGERY — COLONOSCOPY
Anesthesia: Moderate Sedation

## 2013-03-23 ENCOUNTER — Ambulatory Visit (INDEPENDENT_AMBULATORY_CARE_PROVIDER_SITE_OTHER): Payer: Medicare Other | Admitting: Pharmacist

## 2013-03-23 DIAGNOSIS — Z7901 Long term (current) use of anticoagulants: Secondary | ICD-10-CM

## 2013-03-23 DIAGNOSIS — Z952 Presence of prosthetic heart valve: Secondary | ICD-10-CM

## 2013-03-23 DIAGNOSIS — Z954 Presence of other heart-valve replacement: Secondary | ICD-10-CM

## 2013-03-23 LAB — POCT INR: INR: 2.6

## 2013-04-13 ENCOUNTER — Ambulatory Visit (INDEPENDENT_AMBULATORY_CARE_PROVIDER_SITE_OTHER): Payer: Medicare PPO | Admitting: *Deleted

## 2013-04-13 DIAGNOSIS — Z954 Presence of other heart-valve replacement: Secondary | ICD-10-CM

## 2013-04-13 DIAGNOSIS — Z952 Presence of prosthetic heart valve: Secondary | ICD-10-CM

## 2013-04-13 DIAGNOSIS — Z7901 Long term (current) use of anticoagulants: Secondary | ICD-10-CM

## 2013-04-13 LAB — POCT INR: INR: 3

## 2013-04-15 ENCOUNTER — Telehealth: Payer: Self-pay | Admitting: *Deleted

## 2013-04-15 NOTE — Telephone Encounter (Signed)
Called pt.  Home nurse with insurance company was there and states she answered his question.

## 2013-04-15 NOTE — Telephone Encounter (Signed)
Patient has question about his CCR level

## 2013-04-30 ENCOUNTER — Encounter: Payer: Self-pay | Admitting: Cardiology

## 2013-04-30 ENCOUNTER — Ambulatory Visit (INDEPENDENT_AMBULATORY_CARE_PROVIDER_SITE_OTHER): Payer: Medicare PPO | Admitting: Cardiology

## 2013-04-30 ENCOUNTER — Ambulatory Visit (INDEPENDENT_AMBULATORY_CARE_PROVIDER_SITE_OTHER): Payer: Medicare PPO | Admitting: *Deleted

## 2013-04-30 VITALS — BP 144/83 | HR 76 | Ht 72.0 in | Wt 212.0 lb

## 2013-04-30 DIAGNOSIS — Z952 Presence of prosthetic heart valve: Secondary | ICD-10-CM

## 2013-04-30 DIAGNOSIS — I719 Aortic aneurysm of unspecified site, without rupture: Secondary | ICD-10-CM

## 2013-04-30 DIAGNOSIS — I359 Nonrheumatic aortic valve disorder, unspecified: Secondary | ICD-10-CM

## 2013-04-30 DIAGNOSIS — Z5181 Encounter for therapeutic drug level monitoring: Secondary | ICD-10-CM

## 2013-04-30 DIAGNOSIS — E785 Hyperlipidemia, unspecified: Secondary | ICD-10-CM

## 2013-04-30 DIAGNOSIS — I10 Essential (primary) hypertension: Secondary | ICD-10-CM

## 2013-04-30 DIAGNOSIS — Z7901 Long term (current) use of anticoagulants: Secondary | ICD-10-CM

## 2013-04-30 DIAGNOSIS — Z954 Presence of other heart-valve replacement: Secondary | ICD-10-CM

## 2013-04-30 DIAGNOSIS — R002 Palpitations: Secondary | ICD-10-CM

## 2013-04-30 DIAGNOSIS — I350 Nonrheumatic aortic (valve) stenosis: Secondary | ICD-10-CM

## 2013-04-30 LAB — POCT INR: INR: 1.8

## 2013-04-30 MED ORDER — METOPROLOL TARTRATE 25 MG PO TABS: 25.0000 mg | ORAL_TABLET | Freq: Three times a day (TID) | ORAL | Status: DC

## 2013-04-30 NOTE — Assessment & Plan Note (Signed)
Monitor results reviewed with the patient. He does have PACs and PVCs, no definite arrhythmias were seen during the time that he wore the monitor. He still has palpitations. We will increase Lopressor to 25 mg 3 times a day, can uptitrate further if necessary.

## 2013-04-30 NOTE — Assessment & Plan Note (Signed)
Status post mechanical AVR. Continues on Coumadin. Keep regular followup in the Coumadin clinic. Prosthesis was well-seated with mean gradient 7 mm mercury on echocardiogram last March. No change in examination.

## 2013-04-30 NOTE — Progress Notes (Signed)
Clinical Summary [name] is a medically complex 64 y.o.male last seen in August 2014. At that time followup cardiac monitoring was obtained given reported palpitations to evaluate for any potential paroxysmal arrhythmias.  Holter monitoring for 48 hours showed sinus rhythm ranging from 54 up to 134 beats per minute, average 81 beats per minute. Occasional PACs and PVCs were noted with no sustained runs, no pauses, no atrial fibrillation. Symptoms of shortness of breath reported with both normal sinus rhythm and sinus tachycardia.  Echocardiogram in March 2014 demonstrated mild LVH with LVEF 50-55%, septal motion consistent with postoperative state, mid to basal inferoseptal hypokinesis, well-seated mechanical aortic prosthesis with mean gradient 7 mm mercury, no significant mitral regurgitation, trace tricuspid regurgitation with RVSP 25-30 mm mercury, no pericardial effusion, mildly dilated ascending aorta.  Today we reviewed the results of his monitor and last echocardiogram. He continues to report a feeling of palpitations, describes "different rhythms," sometimes a feeling of pounding in his head at nighttime. He has several other complaints ranging from memory problems, depression symptoms, dizziness and unsteadiness, also bilateral arm pain and trouble with fibromyalgia, intermittent leg swelling.  We reviewed his medications and discussed the possibility of increasing his beta blocker to see if this helps with his palpitations. We also discussed arranging a followup chest CT angiogram for review of his thoracic aortic aneurysm repair.   No Known Allergies  Current Outpatient Prescriptions  Medication Sig Dispense Refill  . ALPRAZolam (XANAX) 1 MG tablet Take 1 mg by mouth 5 (five) times daily as needed for anxiety.       [name] atorvastatin (LIPITOR) 40 MG tablet Take 40 mg by mouth daily.       [name] HYDROcodone-acetaminophen (NORCO/VICODIN) 5-325 MG per tablet Take 1 tablet by mouth 3  (three) times daily as needed. pain      . levothyroxine (SYNTHROID, LEVOTHROID) 50 MCG tablet Take 50 mcg by mouth daily.      [name] loratadine (CLARITIN) 10 MG tablet Take 10 mg by mouth daily.      . metoprolol tartrate (LOPRESSOR) 25 MG tablet Take 1 tablet (25 mg total) by mouth 3 (three) times daily.  90 tablet  6  . NITROSTAT 0.4 MG SL tablet Place 0.4 mg under the tongue every 5 (five) minutes as needed for chest pain.       . pantoprazole (PROTONIX) 40 MG tablet Take 40 mg by mouth daily.        . promethazine (PHENERGAN) 25 MG tablet Take 25 mg by mouth every 6 (six) hours as needed. For nausea      . ramipril (ALTACE) 2.5 MG capsule Take 2.5 mg by mouth daily.      [name] venlafaxine (EFFEXOR-XR) 150 MG 24 hr capsule Take 150 mg by mouth 2 (two) times daily.       [name] warfarin (COUMADIN) 2 MG tablet Take 2-3 mg by mouth daily. Patient takes 1 tablet(2mg ) on Saturday and Wednesday and 1&1/2(3mg ) all other days       No current facility-administered medications for this visit.    Past Medical History  Diagnosis Date  . Hyperlipidemia   . Aortic stenosis     Status post AVR 5/13  . Coronary atherosclerosis of native coronary artery     Nonobstructive  . Dyslipidemia   . Fibromyalgia   . Peripheral vascular disease   . Myocardial infarction 10 yrs ago  . Tinnitus     Following prior seizures  . Seizures  6 yrs ago from withdrawal of Xanax to quickly.  . Long-term memory loss   . Stroke   . Arthritis   . Chronic back pain     Lumbar spondylosis  . GERD (gastroesophageal reflux disease)   . Hx of colonic polyps   . Nocturia   . Major depressive disorder     Prior suicidal attempts  . Anxiety   . Thoracic aortic aneurysm     Status post repair 7/13  . Atrial fibrillation     Past Surgical History  Procedure Laterality Date  . Inguinal exploration      Right  . Femoral hernia repair      x2  . Umbilical hernia repair      [facility]  . Finger contracture release      Tendon  release operation on his right little finger  . Appendectomy    . Cholecystectomy      [facility]  . Incisional hernia repair  05/01/2011    Procedure: HERNIA REPAIR INCISIONAL;  Surgeon: [name], MD;  Location: [facility];  Service: General;  Laterality: N/A;  with Mesh  . Tonsillectomy      at age 74  . Circumcision    . Cardiac catheterization    . Colonoscopy    . Esophagogastroduodenoscopy  04/07/08/13  . Aortic valve replacement  08/15/2011    Procedure: AORTIC VALVE REPLACEMENT (AVR);  Surgeon: [name], MD;  Location: [place];  Service: Open Heart Surgery;  Laterality: N/A;  . Sternotomy  10/07/2011    Procedure: STERNOTOMY;  Surgeon: [name], MD;  Location: [place];  Service: Open Heart Surgery;  Laterality: N/A;  . Thoracic aortic aneurysm repair  10/07/2011    Procedure: THORACIC ASCENDING ANEURYSM REPAIR (AAA);  Surgeon: [name], MD;  Location: [place];  Service: Open Heart Surgery;  Laterality: N/A;  . Mediastinal exploration  10/08/2011    Procedure: MEDIASTINAL EXPLORATION;  Surgeon: [name], MD;  Location: [place];  Service: Open Heart Surgery;  Laterality: N/A;    Social History [name] reports that he has been smoking Cigarettes.  He has a 24 pack-year smoking history. He has never used smokeless tobacco. [name] reports that he does not drink alcohol.  Review of Systems As outlined above.  Physical Examination Filed Vitals:   04/30/13 0858  BP: 144/83  Pulse: 76   Filed Weights   04/30/13 0858  Weight: 212 lb (96.163 kg)    No distress.  HEENT: Conjunctiva and lids normal, oropharynx clear.  Neck: Supple, no elevated JVP or carotid bruits, no thyromegaly.  Lungs: Clear to auscultation, nonlabored breathing at rest.  Cardiac: Regular rate and rhythm, loud crisp prosthetic valve click in S2, no S3, soft systolic murmur, no pericardial rub.  Abdomen: Soft, protuberant, nontender, bowel sounds present. Surgical scars  well-healed.  Skin: Warm and dry. Extremities: Trace ankle edema , distal pulses 2+. Neuropsychiatric: Alert and oriented x3, calm.   Problem List and Plan   Coronary atherosclerosis of native coronary artery History of nonobstructive disease. Plan to continue medical therapy and observation. He is on beta blocker and statin, has nitroglycerin available.  Aortic stenosis Status post mechanical AVR. Continues on Coumadin. Keep regular followup in the Coumadin clinic. Prosthesis was well-seated with mean gradient 7 mm mercury on echocardiogram last March. No change in examination.  Palpitations Monitor results reviewed with the patient. He does have PACs and PVCs, no definite arrhythmias were seen during the  time that he wore the monitor. He still has palpitations. We will increase Lopressor to 25 mg 3 times a day, can uptitrate further if necessary.  HYPERLIPIDEMIA-MIXED Continues on Lipitor. Keep follow up with [name].  Aortic aneurysm, including pseudoaneurysm Status post emergent thoracic aortic aneurysm repair by Dr. Roxan Hockey 7/13. Will arrange followup chest CTA for surveillance. Ascending aorta was mildly dilated by echocardiogram last year.    Satira Sark, M.D., F.A.C.C.

## 2013-04-30 NOTE — Assessment & Plan Note (Signed)
History of nonobstructive disease. Plan to continue medical therapy and observation. He is on beta blocker and statin, has nitroglycerin available.

## 2013-04-30 NOTE — Assessment & Plan Note (Signed)
Status post emergent thoracic aortic aneurysm repair by Dr. Roxan Hockey 7/13. Will arrange followup chest CTA for surveillance. Ascending aorta was mildly dilated by echocardiogram last year.

## 2013-04-30 NOTE — Patient Instructions (Signed)
   Increase Lopressor to 25mg  three times per day - new sent to pharm Continue all other medications.   CTA of chest to follow up on aneurysm repair - Vicky will call to schedule.   Lab for BMET - do today Office will contact with results via phone or letter.   Your physician wants you to follow up in: 6 months.  You will receive a reminder letter in the mail one-two months in advance.  If you don't receive a letter, please call our office to schedule the follow up appointment

## 2013-04-30 NOTE — Assessment & Plan Note (Signed)
Continues on Lipitor. Keep follow up with [name].

## 2013-05-04 ENCOUNTER — Encounter: Payer: Self-pay | Admitting: Cardiology

## 2013-05-04 ENCOUNTER — Ambulatory Visit (HOSPITAL_COMMUNITY)
Admission: RE | Admit: 2013-05-04 | Discharge: 2013-05-04 | Disposition: A | Discharge status: Home / Self Care | Payer: Medicare PPO | Source: Ambulatory Visit | Attending: Cardiology | Admitting: Cardiology

## 2013-05-04 DIAGNOSIS — R0602 Shortness of breath: Secondary | ICD-10-CM | POA: Insufficient documentation

## 2013-05-04 DIAGNOSIS — I719 Aortic aneurysm of unspecified site, without rupture: Secondary | ICD-10-CM

## 2013-05-04 DIAGNOSIS — Z87898 Personal history of other specified conditions: Secondary | ICD-10-CM | POA: Insufficient documentation

## 2013-05-04 DIAGNOSIS — R079 Chest pain, unspecified: Secondary | ICD-10-CM | POA: Insufficient documentation

## 2013-05-04 MED ORDER — SODIUM CHLORIDE 0.9 % IJ SOLN
INTRAMUSCULAR | Status: AC
  Filled 2013-05-04: qty 500

## 2013-05-04 MED ORDER — IOHEXOL 350 MG/ML SOLN
100.0000 mL | Freq: Once | INTRAVENOUS | Status: AC | PRN
  Administered 2013-05-04: 100 mL via INTRAVENOUS

## 2013-05-06 ENCOUNTER — Telehealth: Payer: Self-pay | Admitting: *Deleted

## 2013-05-06 NOTE — Telephone Encounter (Signed)
Message copied by Merlene Laughter on Thu May 06, 2013  3:37 PM ------      Message from: MCDOWELL, Aloha Gell      Created: Wed May 05, 2013  7:22 AM       Reviewed report. Please let him know that ascending aortic repair and AVR look stable by CT imaging. We will continue medical therapy. ------

## 2013-05-07 NOTE — Telephone Encounter (Signed)
Pt called and results given. Pt verbalized understanding.

## 2013-05-07 NOTE — Telephone Encounter (Signed)
Message copied by Lewayne Bunting on Fri May 07, 2013 11:15 AM ------      Message from: MCDOWELL, Aloha Gell      Created: Wed May 05, 2013  7:22 AM       Reviewed report. Please let him know that ascending aortic repair and AVR look stable by CT imaging. We will continue medical therapy. ------

## 2013-07-12 ENCOUNTER — Other Ambulatory Visit: Payer: Self-pay | Admitting: *Deleted

## 2013-07-12 MED ORDER — WARFARIN SODIUM 2 MG PO TABS: 2.0000 mg | ORAL_TABLET | Freq: Every day | ORAL | Status: DC

## 2013-08-30 ENCOUNTER — Other Ambulatory Visit: Payer: Self-pay | Admitting: *Deleted

## 2013-08-30 MED ORDER — WARFARIN SODIUM 2 MG PO TABS: 2.0000 mg | ORAL_TABLET | Freq: Every day | ORAL | Status: DC

## 2013-09-13 ENCOUNTER — Other Ambulatory Visit: Payer: Self-pay | Admitting: *Deleted

## 2013-09-13 MED ORDER — WARFARIN SODIUM 2 MG PO TABS: 2.0000 mg | ORAL_TABLET | Freq: Every day | ORAL | Status: DC

## 2013-10-26 ENCOUNTER — Ambulatory Visit: Payer: Medicare PPO | Admitting: Cardiology

## 2013-11-08 ENCOUNTER — Ambulatory Visit: Payer: Medicare PPO | Admitting: Cardiology

## 2013-12-01 ENCOUNTER — Ambulatory Visit (INDEPENDENT_AMBULATORY_CARE_PROVIDER_SITE_OTHER): Payer: Commercial Managed Care - HMO | Admitting: Cardiology

## 2013-12-01 ENCOUNTER — Encounter: Payer: Self-pay | Admitting: Cardiology

## 2013-12-01 VITALS — BP 134/80 | HR 64 | Ht 72.0 in | Wt 227.0 lb

## 2013-12-01 DIAGNOSIS — E785 Hyperlipidemia, unspecified: Secondary | ICD-10-CM

## 2013-12-01 DIAGNOSIS — Z954 Presence of other heart-valve replacement: Secondary | ICD-10-CM

## 2013-12-01 DIAGNOSIS — Z952 Presence of prosthetic heart valve: Secondary | ICD-10-CM

## 2013-12-01 DIAGNOSIS — I2510 Atherosclerotic heart disease of native coronary artery without angina pectoris: Secondary | ICD-10-CM

## 2013-12-01 DIAGNOSIS — I10 Essential (primary) hypertension: Secondary | ICD-10-CM

## 2013-12-01 DIAGNOSIS — I719 Aortic aneurysm of unspecified site, without rupture: Secondary | ICD-10-CM

## 2013-12-01 MED ORDER — LISINOPRIL-HYDROCHLOROTHIAZIDE 10-12.5 MG PO TABS: 1.0000 | ORAL_TABLET | Freq: Every day | ORAL | Status: DC

## 2013-12-01 NOTE — Assessment & Plan Note (Signed)
Continues on Lipitor. States that this is making his "bones ache." I asked if he discussed this with [name] and he could not remember. [name] has been following lipids. Could always consider changing to a different statin.

## 2013-12-01 NOTE — Assessment & Plan Note (Signed)
Status post mechanical AVR with history of aortic stenosis. Continues on Coumadin, now followed by [name]. No change on examination. Prosthesis was well-seated with mean gradient 7 mm mercury on echocardiogram last March. No clear indication for repeat echocardiogram at this time.

## 2013-12-01 NOTE — Patient Instructions (Signed)
Your physician recommends that you schedule a follow-up appointment in: 6 months. You will receive a reminder letter in the mail in about 4 months reminding you to call and schedule your appointment. If you don't receive this letter, please contact our office. Your physician has recommended you make the following change in your medication:  STOP RAMIPRIL. START LISINOPRIL/HCTZ 10/12.5 MG DAILY. Your physician recommends that you have lab work in about 1-2 weeks to check your BMET.

## 2013-12-01 NOTE — Assessment & Plan Note (Signed)
Status post emergent thoracic aortic aneurysm repair by Dr. Roxan Hockey 7/13. Followup chest CTA in February of this year showed stable repair without obvious complication.

## 2013-12-01 NOTE — Assessment & Plan Note (Signed)
Reasonable blood pressure control today. We did discuss changing from Altace to an ACE inhibitor HCTZ combination to perhaps provide him some relief from feelings of leg edema. He has venous insufficiency/stasis and trace edema noted today. We will initiate lisinopril HCTZ 10/12.5 mg once daily, stop Altace, and check BMET in 1-2 weeks. May need a potassium supplement as well.

## 2013-12-01 NOTE — Progress Notes (Signed)
Clinical Summary [name] is a medically complex 63 y.o.male last seen in January. He states he saw [name] one week ago for a routine visit, adjustments were made in his antidepressant regimen, outlined below (list checked with recent refills at pharmacy). He tells me that he is chronically short of breath, feels depressed, no energy, does not want to go out and do things. Also complains of swelling in his hands and feet, itching from his knees down to his feet, chronic low back pain that limits his activity, also problems with his memory.  He does not seem to know the names of his medications at a glance, we did review his cardiac regimen, and verified it with his pharmacy list. He tells [place] that he is having Coumadin followed by [name].  CT angiogram of the chest from February of this year showed stable tube graft repair of the ascending aorta with AVR, no description of apparent complications. We discussed this today.  Echocardiogram in March 2014 demonstrated mild LVH with LVEF 50-55%, septal motion consistent with postoperative state, mid to basal inferoseptal hypokinesis, well-seated mechanical aortic prosthesis with mean gradient 7 mm mercury, no significant mitral regurgitation, trace tricuspid regurgitation with RVSP 25-30 mm mercury, no pericardial effusion, mildly dilated ascending aorta.  Today I asked him to consider trying to increase his activity somewhat, perhaps beginning 10 or 15 minutes at a time. Walking would be a good option or perhaps using a stationary bicycle. We also discussed changing his current ACE inhibitor to an ACE inhibitor HCTZ combination that might help some of his leg edema. Much of this looks to be due to venous insufficiency and stasis. Activity would also help.   Allergies  Allergen Reactions  . Bupropion     ??    Current Outpatient Prescriptions  Medication Sig Dispense Refill  . ALPRAZolam (XANAX) 1 MG tablet Take 1 mg by mouth 5 (five)  times daily as needed for anxiety.       [name] atorvastatin (LIPITOR) 40 MG tablet Take 40 mg by mouth daily.       [name] FLUoxetine (PROZAC) 20 MG capsule Take 1 capsule by mouth daily.      [name] HYDROcodone-acetaminophen (NORCO/VICODIN) 5-325 MG per tablet Take 1 tablet by mouth every 6 (six) hours as needed. pain      . levothyroxine (SYNTHROID, LEVOTHROID) 50 MCG tablet Take 50 mcg by mouth daily.      [name] loratadine (CLARITIN) 10 MG tablet Take 10 mg by mouth daily.      . metoprolol tartrate (LOPRESSOR) 25 MG tablet Take 1 tablet (25 mg total) by mouth 3 (three) times daily.  90 tablet  6  . NITROSTAT 0.4 MG SL tablet Place 0.4 mg under the tongue every 5 (five) minutes as needed for chest pain.       . pantoprazole (PROTONIX) 40 MG tablet Take 40 mg by mouth daily.        . promethazine (PHENERGAN) 25 MG tablet Take 25 mg by mouth every 6 (six) hours as needed. For nausea      . warfarin (COUMADIN) 2 MG tablet Take 1-1.5 tablets (2-3 mg total) by mouth daily. Patient takes 1 tablet(2mg ) on Saturday and Wednesday and 1&1/2(3mg ) all other days  20 tablet  0  . lisinopril-hydrochlorothiazide (PRINZIDE) 10-12.5 MG per tablet Take 1 tablet by mouth daily.  90 tablet  1   No current facility-administered medications for this visit.    Past Medical History  Diagnosis Date  . Hyperlipidemia   . Aortic stenosis     Status post AVR 5/13  . Coronary atherosclerosis of native coronary artery     Nonobstructive  . Dyslipidemia   . Fibromyalgia   . Peripheral vascular disease   . Myocardial infarction 10 yrs ago  . Tinnitus     Following prior seizures  . Seizures     6 yrs ago from withdrawal of Xanax to quickly.  . Long-term memory loss   . Stroke   . Arthritis   . Chronic back pain     Lumbar spondylosis  . GERD (gastroesophageal reflux disease)   . Hx of colonic polyps   . Nocturia   . Major depressive disorder     Prior suicidal attempts  . Anxiety   . Thoracic aortic aneurysm     Status  post repair 7/13  . Atrial fibrillation     Past Surgical History  Procedure Laterality Date  . Inguinal exploration      Right  . Femoral hernia repair      x2  . Umbilical hernia repair      [facility]  . Finger contracture release      Tendon release operation on his right little finger  . Appendectomy    . Cholecystectomy      [facility]  . Incisional hernia repair  05/01/2011    Procedure: HERNIA REPAIR INCISIONAL;  Surgeon: [name], MD;  Location: [facility];  Service: General;  Laterality: N/A;  with Mesh  . Tonsillectomy      at age 70  . Circumcision    . Cardiac catheterization    . Colonoscopy    . Esophagogastroduodenoscopy  04/07/08/13  . Aortic valve replacement  08/15/2011    Procedure: AORTIC VALVE REPLACEMENT (AVR);  Surgeon: [name], MD;  Location: [place];  Service: Open Heart Surgery;  Laterality: N/A;  . Sternotomy  10/07/2011    Procedure: STERNOTOMY;  Surgeon: [name], MD;  Location: [place];  Service: Open Heart Surgery;  Laterality: N/A;  . Thoracic aortic aneurysm repair  10/07/2011    Procedure: THORACIC ASCENDING ANEURYSM REPAIR (AAA);  Surgeon: [name], MD;  Location: [place];  Service: Open Heart Surgery;  Laterality: N/A;  . Mediastinal exploration  10/08/2011    Procedure: MEDIASTINAL EXPLORATION;  Surgeon: [name], MD;  Location: [place];  Service: Open Heart Surgery;  Laterality: N/A;    Social History [name] reports that he has been smoking Cigarettes.  He started smoking about 51 years ago. He has a 24 pack-year smoking history. He has never used smokeless tobacco. [name] reports that he does not drink alcohol.  Review of Systems Systems reviewed above, otherwise negative.  Physical Examination Filed Vitals:   12/01/13 1455  BP: 134/80  Pulse: 64   Filed Weights   12/01/13 1455  Weight: 227 lb (102.967 kg)    Chronically ill-appearing, no distress. HEENT: Conjunctiva and lids normal, oropharynx  clear.  Neck: Supple, no elevated JVP or carotid bruits, no thyromegaly.  Lungs: Clear to auscultation, nonlabored breathing at rest.  Cardiac: Regular rate and rhythm, loud crisp prosthetic valve click in S2, no S3, soft systolic murmur, no pericardial rub.  Abdomen: Soft, protuberant, nontender, bowel sounds present. Skin: Warm and dry.  Extremities: Trace ankle edema and venous stasis, spider veins, distal pulses 2+.  Neuropsychiatric: Alert and oriented x3, calm.   Problem List and Plan   S/P  AVR Status post mechanical AVR with history of aortic stenosis. Continues on Coumadin, now followed by [name]. No change on examination. Prosthesis was well-seated with mean gradient 7 mm mercury on echocardiogram last March. No clear indication for repeat echocardiogram at this time.  Essential hypertension, benign Reasonable blood pressure control today. We did discuss changing from Altace to an ACE inhibitor HCTZ combination to perhaps provide him some relief from feelings of leg edema. He has venous insufficiency/stasis and trace edema noted today. We will initiate lisinopril HCTZ 10/12.5 mg once daily, stop Altace, and check BMET in 1-2 weeks. May need a potassium supplement as well.  Coronary atherosclerosis of native coronary artery Nonobstructive disease, continue beta blocker and statin.  HYPERLIPIDEMIA-MIXED Continues on Lipitor. States that this is making his "bones ache." I asked if he discussed this with [name] and he could not remember. [name] has been following lipids. Could always consider changing to a different statin.  Aortic aneurysm, including pseudoaneurysm Status post emergent thoracic aortic aneurysm repair by Dr. Roxan Hockey 7/13. Followup chest CTA in February of this year showed stable repair without obvious complication.    Satira Sark, M.D., F.A.C.C.

## 2013-12-01 NOTE — Assessment & Plan Note (Signed)
Nonobstructive disease, continue beta blocker and statin.

## 2013-12-02 ENCOUNTER — Ambulatory Visit: Payer: Self-pay | Admitting: Pharmacist

## 2013-12-02 DIAGNOSIS — Z952 Presence of prosthetic heart valve: Secondary | ICD-10-CM

## 2013-12-02 DIAGNOSIS — Z5181 Encounter for therapeutic drug level monitoring: Secondary | ICD-10-CM

## 2013-12-15 ENCOUNTER — Encounter: Payer: Self-pay | Admitting: *Deleted

## 2013-12-17 ENCOUNTER — Telehealth: Payer: Self-pay | Admitting: *Deleted

## 2013-12-17 MED ORDER — POTASSIUM CHLORIDE ER 10 MEQ PO TBCR: 10.0000 meq | EXTENDED_RELEASE_TABLET | Freq: Every day | ORAL | Status: AC

## 2013-12-17 NOTE — Telephone Encounter (Signed)
Patient informed. 

## 2013-12-17 NOTE — Telephone Encounter (Signed)
Message copied by Merlene Laughter on Fri Dec 17, 2013  8:56 AM ------      Message from: MCDOWELL, Aloha Gell      Created: Wed Dec 15, 2013 12:12 PM       Results reviewed. Creatinine 1.3 and potassium 3.0 - recently added HCTZ to his regimen. Would start KCl 10 mEq once daily. Have him take 2 pills each day for the first 2 days, then go to once a day. ------

## 2014-02-12 ENCOUNTER — Other Ambulatory Visit: Payer: Self-pay | Admitting: Cardiology

## 2014-04-04 DIAGNOSIS — I240 Acute coronary thrombosis not resulting in myocardial infarction: Secondary | ICD-10-CM | POA: Diagnosis not present

## 2014-04-04 DIAGNOSIS — I359 Nonrheumatic aortic valve disorder, unspecified: Secondary | ICD-10-CM | POA: Diagnosis not present

## 2014-04-11 DIAGNOSIS — I240 Acute coronary thrombosis not resulting in myocardial infarction: Secondary | ICD-10-CM | POA: Diagnosis not present

## 2014-04-20 IMAGING — CR DG CHEST 2V
1 series · 1 of 1 positions shown · non-contrast
Comparison: 08/21/2011

CLINICAL DATA: Aortic valve replacement chest soreness

CHEST - 2 VIEW

[w chest lat]
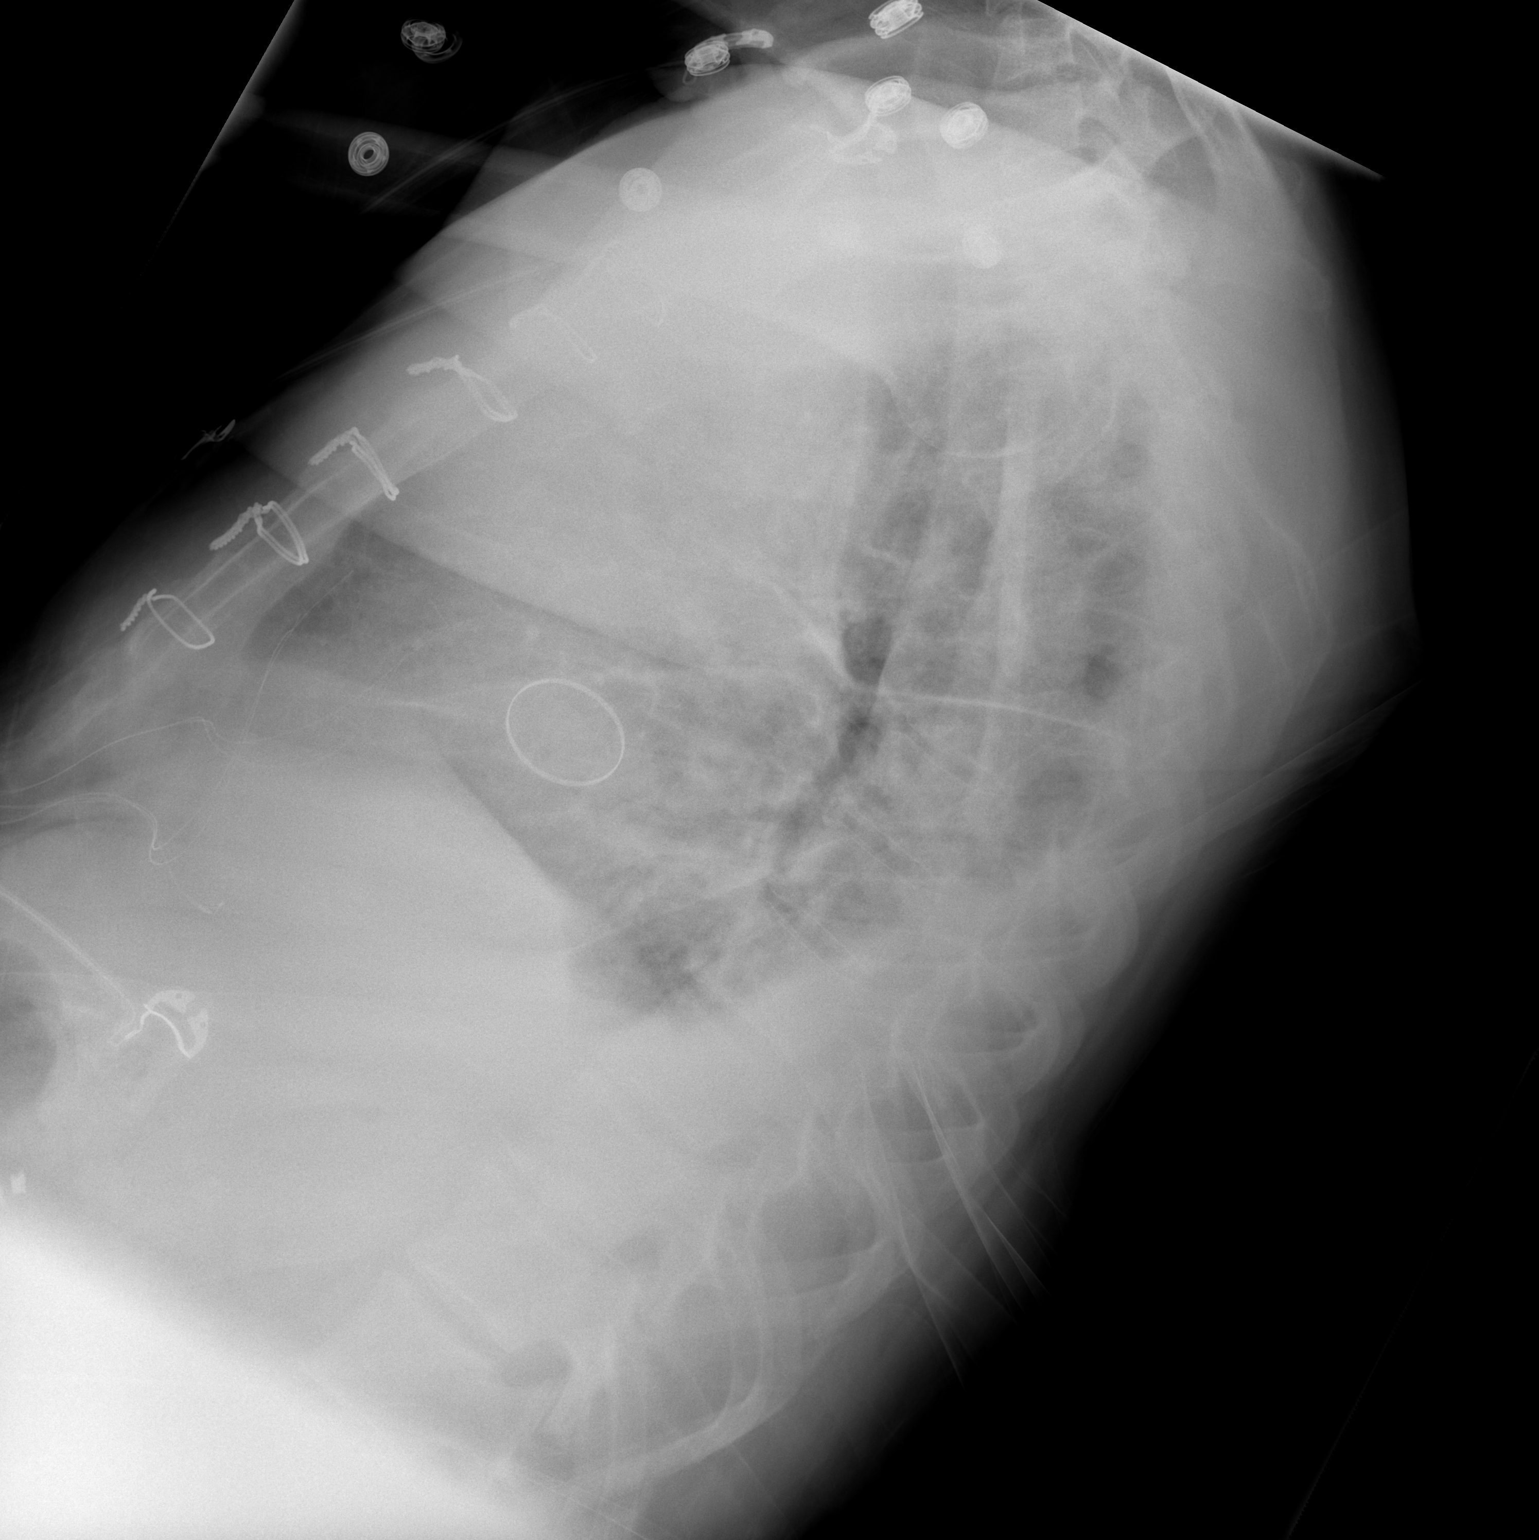

[1 of 1 positions shown; findings below may reference images not displayed]

FINDINGS: Stable cardiomegaly.  Median sternotomy for aortic valve
replacement.  Small posteriorly layering bilateral pleural
effusions.  Pulmonary vascularity is within normal limits.  There
is mild bibasilar atelectasis.  No evidence of pulmonary edema.
IMPRESSION: Small bilateral pleural effusions and bibasilar atelectasis.
Stable cardiomegaly, status post AVR.

## 2014-04-21 DIAGNOSIS — I240 Acute coronary thrombosis not resulting in myocardial infarction: Secondary | ICD-10-CM | POA: Diagnosis not present

## 2014-04-21 IMAGING — DX DG CHEST 1V PORT
1 series · 1 of 1 positions shown · non-contrast
Comparison: Yesterday

CLINICAL DATA: Aortic valve replacement

PORTABLE CHEST - 1 VIEW

[AP]
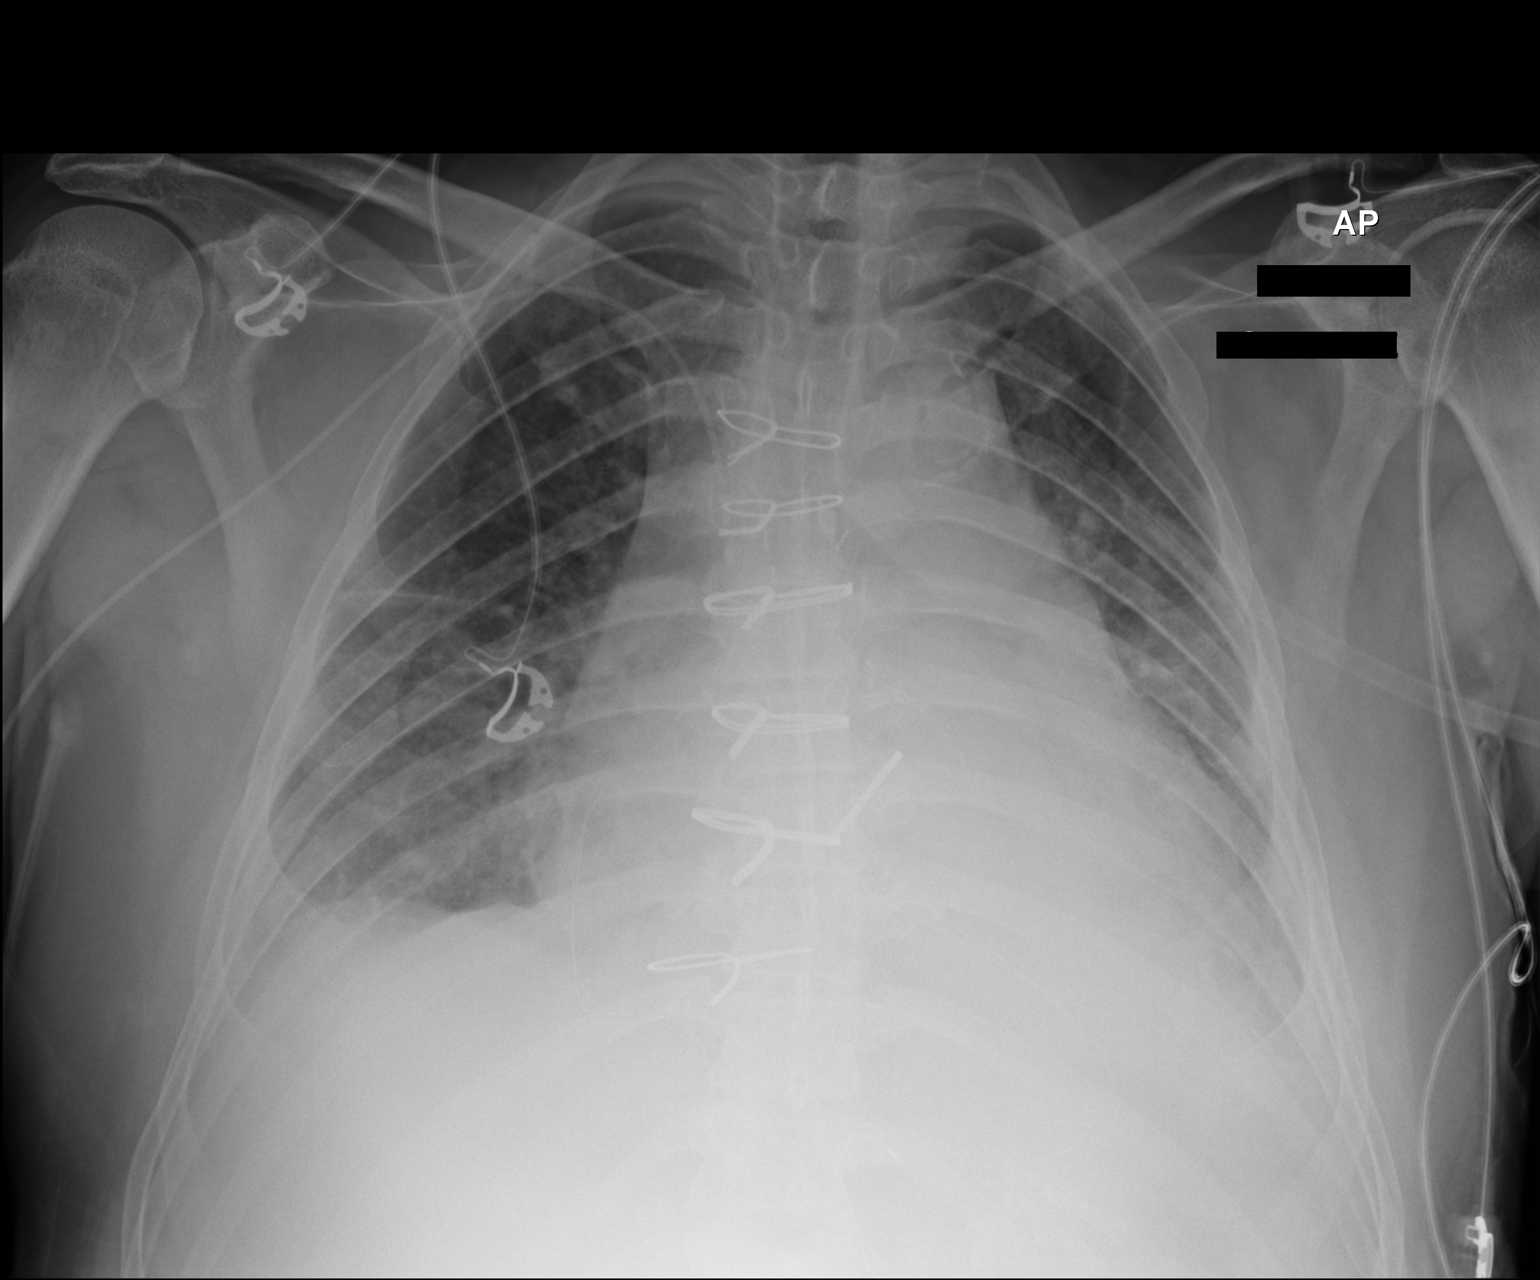

[1 of 1 positions shown; findings below may reference images not displayed]

FINDINGS: Moderate cardiomegaly.  Bibasilar hazy airspace disease
and bilateral effusions are worse.  No pneumothorax.  Vascular
congestion worse.
IMPRESSION: Worsening vascular congestion, bilateral hazy airspace disease and
bilateral effusions.  These findings are most compatible with
worsening volume overload.

## 2014-04-27 DIAGNOSIS — H2513 Age-related nuclear cataract, bilateral: Secondary | ICD-10-CM | POA: Diagnosis not present

## 2014-04-27 DIAGNOSIS — H5210 Myopia, unspecified eye: Secondary | ICD-10-CM | POA: Diagnosis not present

## 2014-04-27 DIAGNOSIS — H5200 Hypermetropia, unspecified eye: Secondary | ICD-10-CM | POA: Diagnosis not present

## 2014-04-27 DIAGNOSIS — H2510 Age-related nuclear cataract, unspecified eye: Secondary | ICD-10-CM | POA: Diagnosis not present

## 2014-05-06 DIAGNOSIS — Z720 Tobacco use: Secondary | ICD-10-CM | POA: Diagnosis not present

## 2014-05-06 DIAGNOSIS — E782 Mixed hyperlipidemia: Secondary | ICD-10-CM | POA: Diagnosis not present

## 2014-05-06 DIAGNOSIS — K210 Gastro-esophageal reflux disease with esophagitis: Secondary | ICD-10-CM | POA: Diagnosis not present

## 2014-05-13 DIAGNOSIS — J301 Allergic rhinitis due to pollen: Secondary | ICD-10-CM | POA: Diagnosis not present

## 2014-05-13 DIAGNOSIS — E039 Hypothyroidism, unspecified: Secondary | ICD-10-CM | POA: Diagnosis not present

## 2014-05-13 DIAGNOSIS — I2510 Atherosclerotic heart disease of native coronary artery without angina pectoris: Secondary | ICD-10-CM | POA: Diagnosis not present

## 2014-05-13 DIAGNOSIS — E876 Hypokalemia: Secondary | ICD-10-CM | POA: Diagnosis not present

## 2014-05-13 DIAGNOSIS — Z720 Tobacco use: Secondary | ICD-10-CM | POA: Diagnosis not present

## 2014-05-13 DIAGNOSIS — E782 Mixed hyperlipidemia: Secondary | ICD-10-CM | POA: Diagnosis not present

## 2014-05-13 DIAGNOSIS — I719 Aortic aneurysm of unspecified site, without rupture: Secondary | ICD-10-CM | POA: Diagnosis not present

## 2014-05-13 DIAGNOSIS — J449 Chronic obstructive pulmonary disease, unspecified: Secondary | ICD-10-CM | POA: Diagnosis not present

## 2014-05-13 DIAGNOSIS — F331 Major depressive disorder, recurrent, moderate: Secondary | ICD-10-CM | POA: Diagnosis not present

## 2014-05-25 ENCOUNTER — Encounter: Payer: Medicare PPO | Admitting: Cardiology

## 2014-05-25 ENCOUNTER — Encounter: Payer: Self-pay | Admitting: Cardiology

## 2014-05-25 NOTE — Progress Notes (Signed)
Patient canceled.  This encounter was created in error - please disregard. 

## 2014-05-31 IMAGING — CR DG CHEST 2V
2 series · 2 of 2 positions shown · non-contrast
Comparison: 09/17/2011

CLINICAL DATA: Pain in the neck and throat.  Previous aortic valve
replacement.

CHEST - 2 VIEW

[w chest pa]
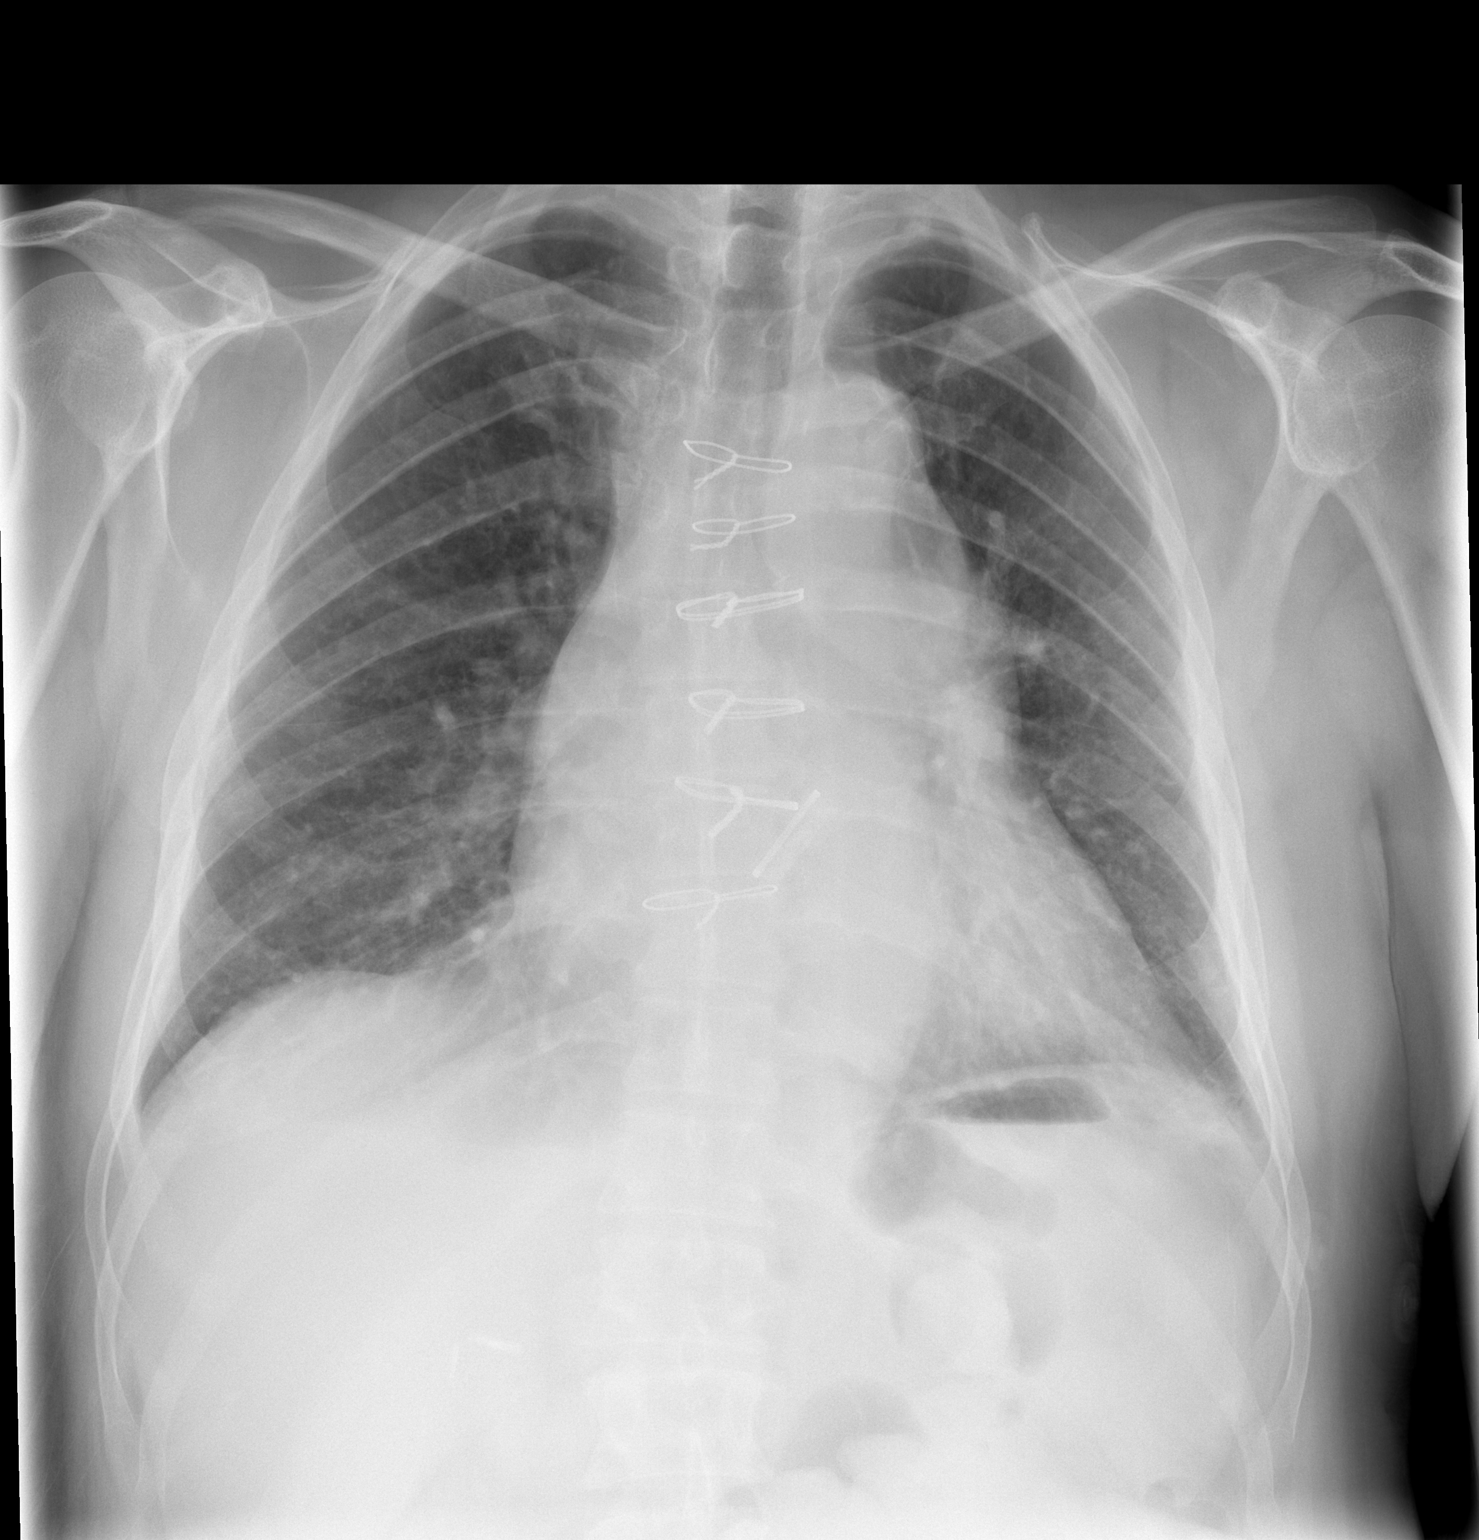

[w chest lat]
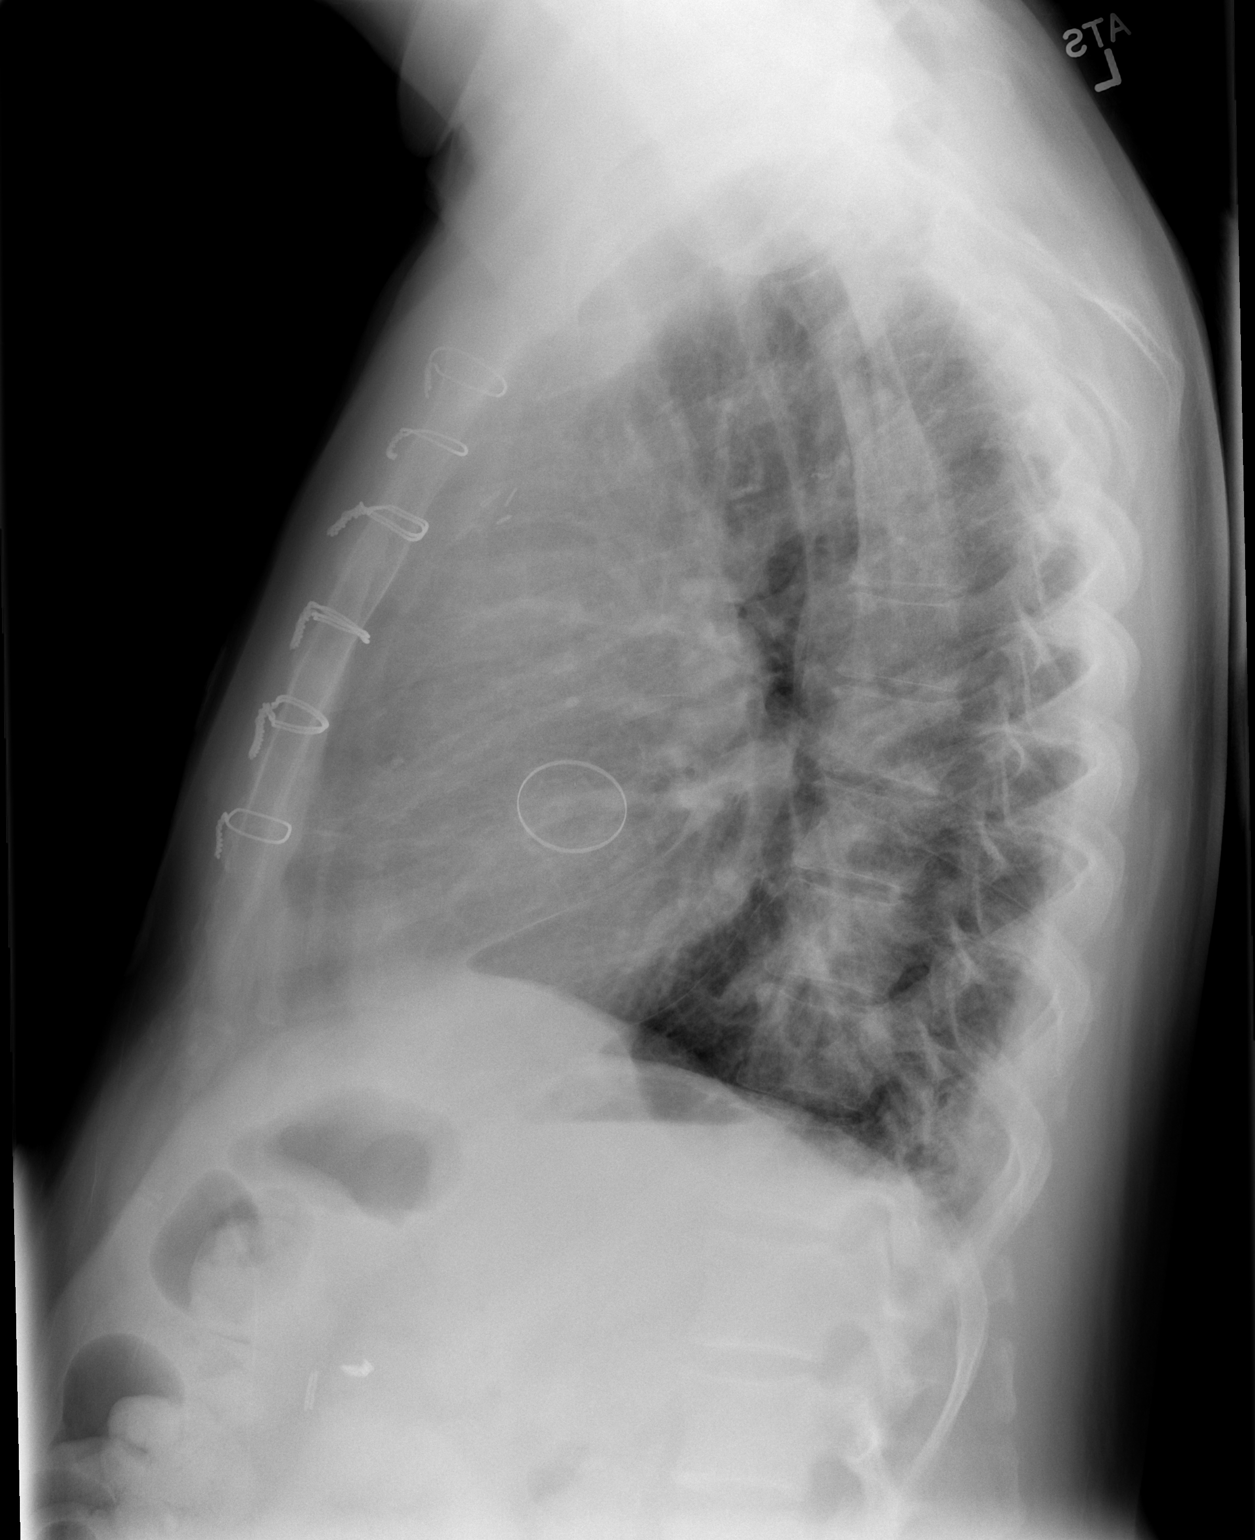

[2 of 2 positions shown; findings below may reference images not displayed]

FINDINGS: There has been previous median sternotomy and aortic
valve replacement.  The heart is mildly enlarged.  Aortic shadows
appear the same.  Question mild venous hypertension without frank
edema.  No effusions.
IMPRESSION: Previous AVR.  Possible venous hypertension/early edema.

## 2014-06-02 DIAGNOSIS — Z720 Tobacco use: Secondary | ICD-10-CM | POA: Diagnosis not present

## 2014-06-02 DIAGNOSIS — J449 Chronic obstructive pulmonary disease, unspecified: Secondary | ICD-10-CM | POA: Diagnosis not present

## 2014-06-02 DIAGNOSIS — I2510 Atherosclerotic heart disease of native coronary artery without angina pectoris: Secondary | ICD-10-CM | POA: Diagnosis not present

## 2014-06-02 DIAGNOSIS — K219 Gastro-esophageal reflux disease without esophagitis: Secondary | ICD-10-CM | POA: Diagnosis not present

## 2014-06-02 DIAGNOSIS — M545 Low back pain: Secondary | ICD-10-CM | POA: Diagnosis not present

## 2014-06-02 DIAGNOSIS — K589 Irritable bowel syndrome without diarrhea: Secondary | ICD-10-CM | POA: Diagnosis not present

## 2014-06-02 DIAGNOSIS — Z9181 History of falling: Secondary | ICD-10-CM | POA: Diagnosis not present

## 2014-06-02 DIAGNOSIS — F331 Major depressive disorder, recurrent, moderate: Secondary | ICD-10-CM | POA: Diagnosis not present

## 2014-06-02 DIAGNOSIS — E669 Obesity, unspecified: Secondary | ICD-10-CM | POA: Diagnosis not present

## 2014-06-03 DIAGNOSIS — M47896 Other spondylosis, lumbar region: Secondary | ICD-10-CM | POA: Diagnosis not present

## 2014-06-03 DIAGNOSIS — M5136 Other intervertebral disc degeneration, lumbar region: Secondary | ICD-10-CM | POA: Diagnosis not present

## 2014-06-03 DIAGNOSIS — M545 Low back pain: Secondary | ICD-10-CM | POA: Diagnosis not present

## 2014-06-03 DIAGNOSIS — M5416 Radiculopathy, lumbar region: Secondary | ICD-10-CM | POA: Diagnosis not present

## 2014-06-03 DIAGNOSIS — R937 Abnormal findings on diagnostic imaging of other parts of musculoskeletal system: Secondary | ICD-10-CM | POA: Diagnosis not present

## 2014-06-03 DIAGNOSIS — M5137 Other intervertebral disc degeneration, lumbosacral region: Secondary | ICD-10-CM | POA: Diagnosis not present

## 2014-06-03 IMAGING — CR DG CHEST 1V PORT
1 series · 1 of 1 positions shown · non-contrast
Comparison: Plain film chest 10/07/2011.

CLINICAL DATA: Status post repair of ascending aortic aneurysm.

PORTABLE CHEST - 1 VIEW

[AP]
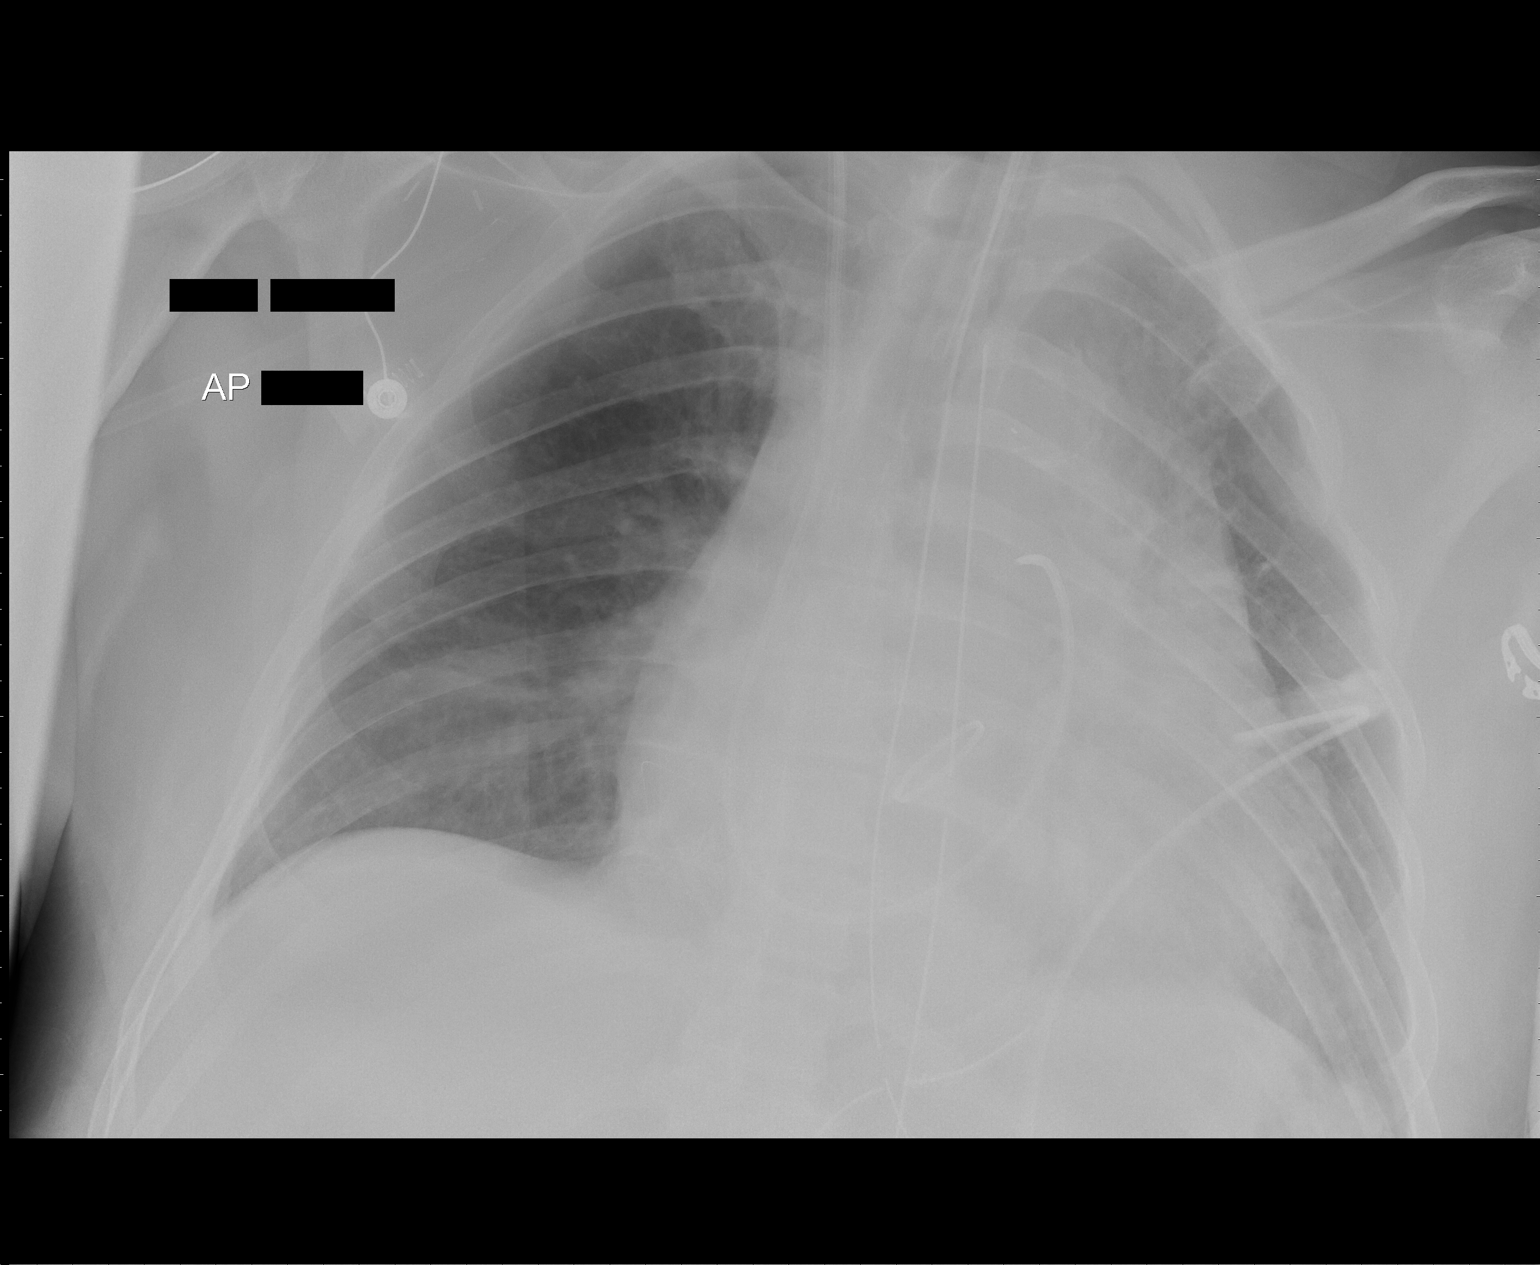

[1 of 1 positions shown; findings below may reference images not displayed]

FINDINGS: Support tubes and lines are unchanged and in good
position.  No pneumothorax is identified with a left chest tube in
place.  There is some bibasilar atelectasis, worse on the left.
Cardiomegaly noted.  Prominence of the superior mediastinum again
noted.
IMPRESSION: No interval change with left worse than right atelectasis noted.

## 2014-06-03 IMAGING — CR DG CHEST 1V PORT
1 series · 1 of 1 positions shown · non-contrast
Comparison: [DATE] hours

CLINICAL DATA: Aortic valve replacement

PORTABLE CHEST - 1 VIEW

[AP]
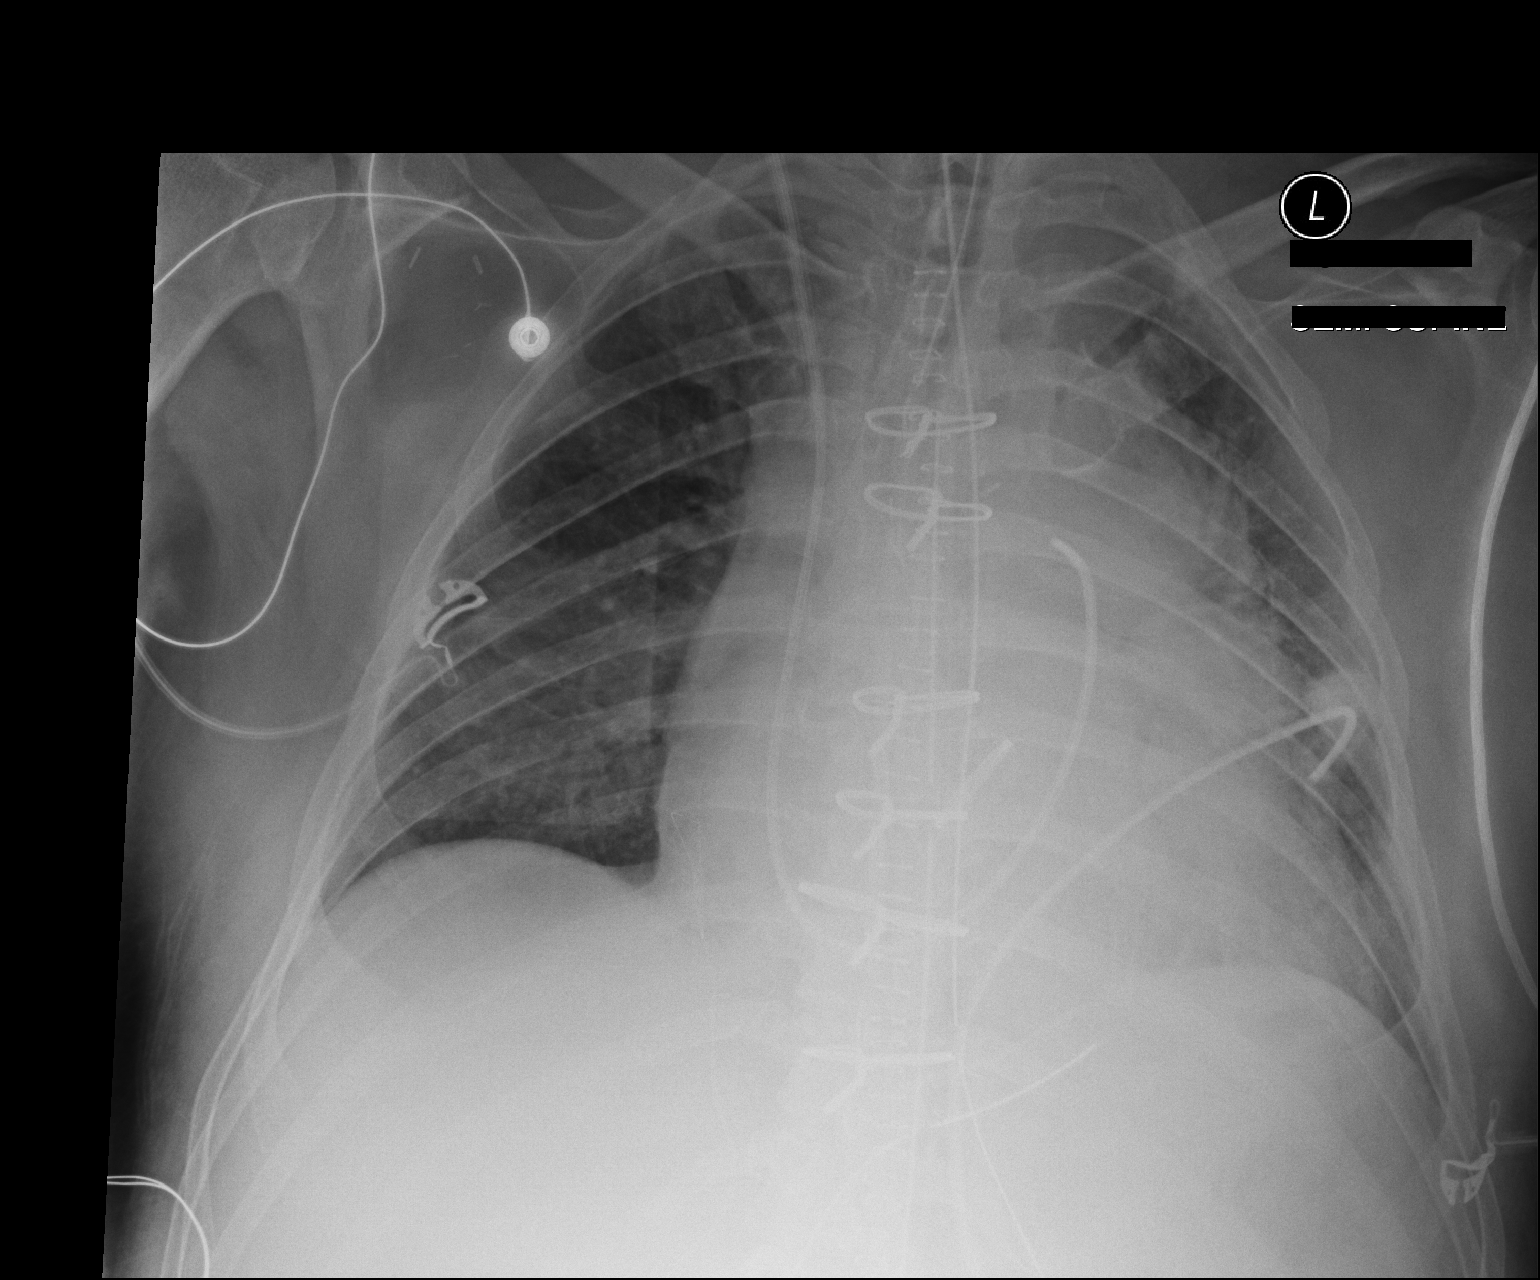

[1 of 1 positions shown; findings below may reference images not displayed]

FINDINGS: Endotracheal tube, NG tube, Swan-Ganz catheter, multiple
chest tubes are stable.  No pneumothorax.  Mediastinum remains
prominent.  Pacemaker wires in place.  Pulmonary vascularity within
normal limits.  Improved atelectasis at the right base. Stable
opacity at the left base.
IMPRESSION: Mediastinum remains prominent

No pneumothorax.

Improved atelectasis at the right base.

## 2014-06-06 DIAGNOSIS — I240 Acute coronary thrombosis not resulting in myocardial infarction: Secondary | ICD-10-CM | POA: Diagnosis not present

## 2014-06-07 DIAGNOSIS — K219 Gastro-esophageal reflux disease without esophagitis: Secondary | ICD-10-CM | POA: Diagnosis not present

## 2014-06-07 DIAGNOSIS — Z9181 History of falling: Secondary | ICD-10-CM | POA: Diagnosis not present

## 2014-06-07 DIAGNOSIS — E669 Obesity, unspecified: Secondary | ICD-10-CM | POA: Diagnosis not present

## 2014-06-07 DIAGNOSIS — J449 Chronic obstructive pulmonary disease, unspecified: Secondary | ICD-10-CM | POA: Diagnosis not present

## 2014-06-07 DIAGNOSIS — F331 Major depressive disorder, recurrent, moderate: Secondary | ICD-10-CM | POA: Diagnosis not present

## 2014-06-07 DIAGNOSIS — Z720 Tobacco use: Secondary | ICD-10-CM | POA: Diagnosis not present

## 2014-06-07 DIAGNOSIS — K589 Irritable bowel syndrome without diarrhea: Secondary | ICD-10-CM | POA: Diagnosis not present

## 2014-06-07 DIAGNOSIS — M545 Low back pain: Secondary | ICD-10-CM | POA: Diagnosis not present

## 2014-06-07 DIAGNOSIS — I2510 Atherosclerotic heart disease of native coronary artery without angina pectoris: Secondary | ICD-10-CM | POA: Diagnosis not present

## 2014-06-08 DIAGNOSIS — I2510 Atherosclerotic heart disease of native coronary artery without angina pectoris: Secondary | ICD-10-CM | POA: Diagnosis not present

## 2014-06-08 DIAGNOSIS — Z720 Tobacco use: Secondary | ICD-10-CM | POA: Diagnosis not present

## 2014-06-08 DIAGNOSIS — K219 Gastro-esophageal reflux disease without esophagitis: Secondary | ICD-10-CM | POA: Diagnosis not present

## 2014-06-08 DIAGNOSIS — K589 Irritable bowel syndrome without diarrhea: Secondary | ICD-10-CM | POA: Diagnosis not present

## 2014-06-08 DIAGNOSIS — F331 Major depressive disorder, recurrent, moderate: Secondary | ICD-10-CM | POA: Diagnosis not present

## 2014-06-08 DIAGNOSIS — Z9181 History of falling: Secondary | ICD-10-CM | POA: Diagnosis not present

## 2014-06-08 DIAGNOSIS — J449 Chronic obstructive pulmonary disease, unspecified: Secondary | ICD-10-CM | POA: Diagnosis not present

## 2014-06-08 DIAGNOSIS — M545 Low back pain: Secondary | ICD-10-CM | POA: Diagnosis not present

## 2014-06-08 DIAGNOSIS — E669 Obesity, unspecified: Secondary | ICD-10-CM | POA: Diagnosis not present

## 2014-06-10 DIAGNOSIS — F331 Major depressive disorder, recurrent, moderate: Secondary | ICD-10-CM | POA: Diagnosis not present

## 2014-06-10 DIAGNOSIS — K589 Irritable bowel syndrome without diarrhea: Secondary | ICD-10-CM | POA: Diagnosis not present

## 2014-06-10 DIAGNOSIS — E669 Obesity, unspecified: Secondary | ICD-10-CM | POA: Diagnosis not present

## 2014-06-10 DIAGNOSIS — Z720 Tobacco use: Secondary | ICD-10-CM | POA: Diagnosis not present

## 2014-06-10 DIAGNOSIS — Z9181 History of falling: Secondary | ICD-10-CM | POA: Diagnosis not present

## 2014-06-10 DIAGNOSIS — K219 Gastro-esophageal reflux disease without esophagitis: Secondary | ICD-10-CM | POA: Diagnosis not present

## 2014-06-10 DIAGNOSIS — M545 Low back pain: Secondary | ICD-10-CM | POA: Diagnosis not present

## 2014-06-10 DIAGNOSIS — J449 Chronic obstructive pulmonary disease, unspecified: Secondary | ICD-10-CM | POA: Diagnosis not present

## 2014-06-10 DIAGNOSIS — I2510 Atherosclerotic heart disease of native coronary artery without angina pectoris: Secondary | ICD-10-CM | POA: Diagnosis not present

## 2014-06-13 DIAGNOSIS — K589 Irritable bowel syndrome without diarrhea: Secondary | ICD-10-CM | POA: Diagnosis not present

## 2014-06-13 DIAGNOSIS — E669 Obesity, unspecified: Secondary | ICD-10-CM | POA: Diagnosis not present

## 2014-06-13 DIAGNOSIS — F331 Major depressive disorder, recurrent, moderate: Secondary | ICD-10-CM | POA: Diagnosis not present

## 2014-06-13 DIAGNOSIS — M545 Low back pain: Secondary | ICD-10-CM | POA: Diagnosis not present

## 2014-06-13 DIAGNOSIS — Z720 Tobacco use: Secondary | ICD-10-CM | POA: Diagnosis not present

## 2014-06-13 DIAGNOSIS — K219 Gastro-esophageal reflux disease without esophagitis: Secondary | ICD-10-CM | POA: Diagnosis not present

## 2014-06-13 DIAGNOSIS — I2510 Atherosclerotic heart disease of native coronary artery without angina pectoris: Secondary | ICD-10-CM | POA: Diagnosis not present

## 2014-06-13 DIAGNOSIS — Z9181 History of falling: Secondary | ICD-10-CM | POA: Diagnosis not present

## 2014-06-13 DIAGNOSIS — J449 Chronic obstructive pulmonary disease, unspecified: Secondary | ICD-10-CM | POA: Diagnosis not present

## 2014-06-14 ENCOUNTER — Ambulatory Visit: Payer: Medicare PPO | Admitting: Cardiology

## 2014-06-15 DIAGNOSIS — Z9181 History of falling: Secondary | ICD-10-CM | POA: Diagnosis not present

## 2014-06-15 DIAGNOSIS — J449 Chronic obstructive pulmonary disease, unspecified: Secondary | ICD-10-CM | POA: Diagnosis not present

## 2014-06-15 DIAGNOSIS — E669 Obesity, unspecified: Secondary | ICD-10-CM | POA: Diagnosis not present

## 2014-06-15 DIAGNOSIS — K219 Gastro-esophageal reflux disease without esophagitis: Secondary | ICD-10-CM | POA: Diagnosis not present

## 2014-06-15 DIAGNOSIS — K589 Irritable bowel syndrome without diarrhea: Secondary | ICD-10-CM | POA: Diagnosis not present

## 2014-06-15 DIAGNOSIS — Z720 Tobacco use: Secondary | ICD-10-CM | POA: Diagnosis not present

## 2014-06-15 DIAGNOSIS — I2510 Atherosclerotic heart disease of native coronary artery without angina pectoris: Secondary | ICD-10-CM | POA: Diagnosis not present

## 2014-06-15 DIAGNOSIS — M545 Low back pain: Secondary | ICD-10-CM | POA: Diagnosis not present

## 2014-06-15 DIAGNOSIS — F331 Major depressive disorder, recurrent, moderate: Secondary | ICD-10-CM | POA: Diagnosis not present

## 2014-06-17 DIAGNOSIS — R590 Localized enlarged lymph nodes: Secondary | ICD-10-CM | POA: Diagnosis not present

## 2014-06-17 DIAGNOSIS — Z720 Tobacco use: Secondary | ICD-10-CM | POA: Diagnosis not present

## 2014-06-17 DIAGNOSIS — Z9049 Acquired absence of other specified parts of digestive tract: Secondary | ICD-10-CM | POA: Diagnosis not present

## 2014-06-17 DIAGNOSIS — R935 Abnormal findings on diagnostic imaging of other abdominal regions, including retroperitoneum: Secondary | ICD-10-CM | POA: Diagnosis not present

## 2014-06-17 DIAGNOSIS — I2510 Atherosclerotic heart disease of native coronary artery without angina pectoris: Secondary | ICD-10-CM | POA: Diagnosis not present

## 2014-06-17 DIAGNOSIS — K589 Irritable bowel syndrome without diarrhea: Secondary | ICD-10-CM | POA: Diagnosis not present

## 2014-06-17 DIAGNOSIS — F331 Major depressive disorder, recurrent, moderate: Secondary | ICD-10-CM | POA: Diagnosis not present

## 2014-06-17 DIAGNOSIS — M545 Low back pain: Secondary | ICD-10-CM | POA: Diagnosis not present

## 2014-06-17 DIAGNOSIS — E669 Obesity, unspecified: Secondary | ICD-10-CM | POA: Diagnosis not present

## 2014-06-17 DIAGNOSIS — J449 Chronic obstructive pulmonary disease, unspecified: Secondary | ICD-10-CM | POA: Diagnosis not present

## 2014-06-17 DIAGNOSIS — K219 Gastro-esophageal reflux disease without esophagitis: Secondary | ICD-10-CM | POA: Diagnosis not present

## 2014-06-17 DIAGNOSIS — Z9181 History of falling: Secondary | ICD-10-CM | POA: Diagnosis not present

## 2014-06-22 ENCOUNTER — Ambulatory Visit: Payer: Medicare PPO | Admitting: *Deleted

## 2014-06-22 DIAGNOSIS — K219 Gastro-esophageal reflux disease without esophagitis: Secondary | ICD-10-CM | POA: Diagnosis not present

## 2014-06-22 DIAGNOSIS — K589 Irritable bowel syndrome without diarrhea: Secondary | ICD-10-CM | POA: Diagnosis not present

## 2014-06-22 DIAGNOSIS — J449 Chronic obstructive pulmonary disease, unspecified: Secondary | ICD-10-CM | POA: Diagnosis not present

## 2014-06-22 DIAGNOSIS — Z9181 History of falling: Secondary | ICD-10-CM | POA: Diagnosis not present

## 2014-06-22 DIAGNOSIS — Z720 Tobacco use: Secondary | ICD-10-CM | POA: Diagnosis not present

## 2014-06-22 DIAGNOSIS — E669 Obesity, unspecified: Secondary | ICD-10-CM | POA: Diagnosis not present

## 2014-06-22 DIAGNOSIS — I2510 Atherosclerotic heart disease of native coronary artery without angina pectoris: Secondary | ICD-10-CM | POA: Diagnosis not present

## 2014-06-22 DIAGNOSIS — F331 Major depressive disorder, recurrent, moderate: Secondary | ICD-10-CM | POA: Diagnosis not present

## 2014-06-22 DIAGNOSIS — M545 Low back pain: Secondary | ICD-10-CM | POA: Diagnosis not present

## 2014-06-23 DIAGNOSIS — Z9181 History of falling: Secondary | ICD-10-CM | POA: Diagnosis not present

## 2014-06-23 DIAGNOSIS — I2510 Atherosclerotic heart disease of native coronary artery without angina pectoris: Secondary | ICD-10-CM | POA: Diagnosis not present

## 2014-06-23 DIAGNOSIS — E669 Obesity, unspecified: Secondary | ICD-10-CM | POA: Diagnosis not present

## 2014-06-23 DIAGNOSIS — F331 Major depressive disorder, recurrent, moderate: Secondary | ICD-10-CM | POA: Diagnosis not present

## 2014-06-23 DIAGNOSIS — Z720 Tobacco use: Secondary | ICD-10-CM | POA: Diagnosis not present

## 2014-06-23 DIAGNOSIS — M545 Low back pain: Secondary | ICD-10-CM | POA: Diagnosis not present

## 2014-06-23 DIAGNOSIS — J449 Chronic obstructive pulmonary disease, unspecified: Secondary | ICD-10-CM | POA: Diagnosis not present

## 2014-06-23 DIAGNOSIS — I719 Aortic aneurysm of unspecified site, without rupture: Secondary | ICD-10-CM | POA: Diagnosis not present

## 2014-06-23 DIAGNOSIS — K219 Gastro-esophageal reflux disease without esophagitis: Secondary | ICD-10-CM | POA: Diagnosis not present

## 2014-06-23 DIAGNOSIS — K589 Irritable bowel syndrome without diarrhea: Secondary | ICD-10-CM | POA: Diagnosis not present

## 2014-06-27 DIAGNOSIS — Z9181 History of falling: Secondary | ICD-10-CM | POA: Diagnosis not present

## 2014-06-27 DIAGNOSIS — J449 Chronic obstructive pulmonary disease, unspecified: Secondary | ICD-10-CM | POA: Diagnosis not present

## 2014-06-27 DIAGNOSIS — K589 Irritable bowel syndrome without diarrhea: Secondary | ICD-10-CM | POA: Diagnosis not present

## 2014-06-27 DIAGNOSIS — Z720 Tobacco use: Secondary | ICD-10-CM | POA: Diagnosis not present

## 2014-06-27 DIAGNOSIS — I2510 Atherosclerotic heart disease of native coronary artery without angina pectoris: Secondary | ICD-10-CM | POA: Diagnosis not present

## 2014-06-27 DIAGNOSIS — F331 Major depressive disorder, recurrent, moderate: Secondary | ICD-10-CM | POA: Diagnosis not present

## 2014-06-27 DIAGNOSIS — K219 Gastro-esophageal reflux disease without esophagitis: Secondary | ICD-10-CM | POA: Diagnosis not present

## 2014-06-27 DIAGNOSIS — M545 Low back pain: Secondary | ICD-10-CM | POA: Diagnosis not present

## 2014-06-27 DIAGNOSIS — E669 Obesity, unspecified: Secondary | ICD-10-CM | POA: Diagnosis not present

## 2014-06-28 ENCOUNTER — Other Ambulatory Visit: Payer: Self-pay | Admitting: Licensed Clinical Social Worker

## 2014-06-28 NOTE — Patient Outreach (Signed)
CSW spoke via phone on 06/28/14 with Apolonio Schneiders (Tourist information centre manager) at [facility]. CSW verified identity of Apolonio Schneiders, case Freight forwarder at [place]. CSW spoke of client needs with Apolonio Schneiders.   Apolonio Schneiders informed CSW that she had spoken 4 times with client and spoken with client about mental health needs of client.  CSW informed Apolonio Schneiders that Dr.Daniel had been suggesting counseling support for client and had talked in recent months with client about counseling.  Apolonio Schneiders said she would research [employer] mental health providers in the area where client lived. CSW mentioned to [place] that in [place], [place] the [place] were available for transport assist for $3.00 each way within [employer]  CSW mentioned to Apolonio Schneiders that client scored 21 on PHQ-9 and has low self esteem, depression symptoms, and socially isolates himself from others. CSW informed Apolonio Schneiders that client received [place] support from [place].  Apolonio Schneiders said she would be in further telephone contact with client and would research mental health providers in area near client.  CSW gave Apolonio Schneiders CSW [facility] number of [phone] and invited Apolonio Schneiders to contact CSW to discuss ongoing mental health needs of client.  Plan: Apolonio Schneiders, caseworker from [facility] to further communicate via phone with client to discuss mental health needs of client and resources for mental health care for client in area where client resides. CSW to call client in one week to assess needs of client.  [name].Dierks Wach MSW, LCSW Licensed Clinical Social Worker [facility] Care Management [phone]

## 2014-06-30 DIAGNOSIS — Z9181 History of falling: Secondary | ICD-10-CM | POA: Diagnosis not present

## 2014-06-30 DIAGNOSIS — M545 Low back pain: Secondary | ICD-10-CM | POA: Diagnosis not present

## 2014-06-30 DIAGNOSIS — E669 Obesity, unspecified: Secondary | ICD-10-CM | POA: Diagnosis not present

## 2014-06-30 DIAGNOSIS — J449 Chronic obstructive pulmonary disease, unspecified: Secondary | ICD-10-CM | POA: Diagnosis not present

## 2014-06-30 DIAGNOSIS — K219 Gastro-esophageal reflux disease without esophagitis: Secondary | ICD-10-CM | POA: Diagnosis not present

## 2014-06-30 DIAGNOSIS — K589 Irritable bowel syndrome without diarrhea: Secondary | ICD-10-CM | POA: Diagnosis not present

## 2014-06-30 DIAGNOSIS — F331 Major depressive disorder, recurrent, moderate: Secondary | ICD-10-CM | POA: Diagnosis not present

## 2014-06-30 DIAGNOSIS — Z720 Tobacco use: Secondary | ICD-10-CM | POA: Diagnosis not present

## 2014-06-30 DIAGNOSIS — I2510 Atherosclerotic heart disease of native coronary artery without angina pectoris: Secondary | ICD-10-CM | POA: Diagnosis not present

## 2014-07-01 DIAGNOSIS — E876 Hypokalemia: Secondary | ICD-10-CM | POA: Diagnosis not present

## 2014-07-01 DIAGNOSIS — E782 Mixed hyperlipidemia: Secondary | ICD-10-CM | POA: Diagnosis not present

## 2014-07-01 DIAGNOSIS — E039 Hypothyroidism, unspecified: Secondary | ICD-10-CM | POA: Diagnosis not present

## 2014-07-04 DIAGNOSIS — K589 Irritable bowel syndrome without diarrhea: Secondary | ICD-10-CM | POA: Diagnosis not present

## 2014-07-04 DIAGNOSIS — M545 Low back pain: Secondary | ICD-10-CM | POA: Diagnosis not present

## 2014-07-04 DIAGNOSIS — I2510 Atherosclerotic heart disease of native coronary artery without angina pectoris: Secondary | ICD-10-CM | POA: Diagnosis not present

## 2014-07-04 DIAGNOSIS — Z720 Tobacco use: Secondary | ICD-10-CM | POA: Diagnosis not present

## 2014-07-04 DIAGNOSIS — J449 Chronic obstructive pulmonary disease, unspecified: Secondary | ICD-10-CM | POA: Diagnosis not present

## 2014-07-04 DIAGNOSIS — E669 Obesity, unspecified: Secondary | ICD-10-CM | POA: Diagnosis not present

## 2014-07-04 DIAGNOSIS — F331 Major depressive disorder, recurrent, moderate: Secondary | ICD-10-CM | POA: Diagnosis not present

## 2014-07-04 DIAGNOSIS — K219 Gastro-esophageal reflux disease without esophagitis: Secondary | ICD-10-CM | POA: Diagnosis not present

## 2014-07-04 DIAGNOSIS — Z9181 History of falling: Secondary | ICD-10-CM | POA: Diagnosis not present

## 2014-07-05 ENCOUNTER — Other Ambulatory Visit: Payer: Self-pay | Admitting: Licensed Clinical Social Worker

## 2014-07-05 ENCOUNTER — Ambulatory Visit (INDEPENDENT_AMBULATORY_CARE_PROVIDER_SITE_OTHER): Payer: Commercial Managed Care - HMO | Admitting: Cardiology

## 2014-07-05 ENCOUNTER — Encounter: Payer: Self-pay | Admitting: Cardiology

## 2014-07-05 VITALS — BP 128/80 | HR 55 | Ht 72.0 in | Wt 225.8 lb

## 2014-07-05 DIAGNOSIS — E785 Hyperlipidemia, unspecified: Secondary | ICD-10-CM | POA: Diagnosis not present

## 2014-07-05 DIAGNOSIS — I10 Essential (primary) hypertension: Secondary | ICD-10-CM

## 2014-07-05 DIAGNOSIS — Z952 Presence of prosthetic heart valve: Secondary | ICD-10-CM

## 2014-07-05 DIAGNOSIS — I2510 Atherosclerotic heart disease of native coronary artery without angina pectoris: Secondary | ICD-10-CM

## 2014-07-05 DIAGNOSIS — Z954 Presence of other heart-valve replacement: Secondary | ICD-10-CM

## 2014-07-05 NOTE — Patient Outreach (Signed)
[facility]) Care Management  [facility] Social Work  07/05/2014  [name] [birth year] [id number]  Subjective:    Objective:   Current Medications:  Current Outpatient Prescriptions  Medication Sig Dispense Refill  . ALPRAZolam (XANAX) 1 MG tablet Take 1 mg by mouth 5 (five) times daily as needed for anxiety.     [name] atorvastatin (LIPITOR) 40 MG tablet Take 40 mg by mouth daily.     [name] FLUoxetine (PROZAC) 20 MG capsule Take 1 capsule by mouth daily.    [name] HYDROcodone-acetaminophen (NORCO/VICODIN) 5-325 MG per tablet Take 1 tablet by mouth every 6 (six) hours as needed. pain    . levothyroxine (SYNTHROID, LEVOTHROID) 50 MCG tablet Take 50 mcg by mouth daily.    [name] lisinopril-hydrochlorothiazide (PRINZIDE) 10-12.5 MG per tablet Take 1 tablet by mouth daily. 90 tablet 1  . loratadine (CLARITIN) 10 MG tablet Take 10 mg by mouth daily.    . metoprolol tartrate (LOPRESSOR) 25 MG tablet TAKE ONE TABLET BY MOUTH THREE TIMES DAILY 90 tablet 6  . NITROSTAT 0.4 MG SL tablet Place 0.4 mg under the tongue every 5 (five) minutes as needed for chest pain.     . pantoprazole (PROTONIX) 40 MG tablet Take 40 mg by mouth daily.      . potassium chloride (K-DUR) 10 MEQ tablet Take 1 tablet (10 mEq total) by mouth daily. 90 tablet 3  . promethazine (PHENERGAN) 25 MG tablet Take 25 mg by mouth every 6 (six) hours as needed. For nausea    . warfarin (COUMADIN) 2 MG tablet Take 1-1.5 tablets (2-3 mg total) by mouth daily. Patient takes 1 tablet(2mg ) on Saturday and Wednesday and 1&1/2(3mg ) all other days 20 tablet 0   No current facility-administered medications for this visit.    Functional Status:  In your present state of health, do you have any difficulty performing the following activities: 07/05/2014  Is the patient deaf or have difficulty hearing? N  Hearing N  Vision Y  Difficulty concentrating or making decisions Y  Walking or climbing stairs? Y  Doing errands, shopping? Y  Preparing  Food and eating ? Y  Using the Toilet? Y  In the past six months, have you accidently leaked urine? N  Do you have problems with loss of bowel control? N  Managing your Medications? N  Managing your Finances? Y  Housekeeping or managing your Housekeeping? Y    Fall/Depression Screening:  PHQ 2/9 Scores 07/05/2014  PHQ - 2 Score 2  PHQ- 9 Score 21    Assessment:   Previous documentation in [facility] record.CSW met client on 07/05/14 at home of client for a routine home visit. Client said that he continued to have physical therapy support from [place] physical therapist, Anderson Malta, as scheduled.  Client had not been able to qualify for equipment needed through his insurance. Anderson Malta, physical therapist from [place], had asked CSW to see if  CSW could locate a 3 in 1 bedside commode for client to use in bathroom.  CSW contacted Pollie Meyer at [facility] center in [place], [place] and was able to find a 3 in 1 beside commode for client from the [place] [facility] loan closet.  CSW took 3 in 1 bedside commode to client at home visit on 07/05/14.  CSW informed client that bedside commode was on loan for client from the [place] center lending closet and that client could return bedside commode when he was no longer needing that equipment item.  CSW also informed client that client would need to show 3 in 1 bedside commode to [facility], physical therapist, in order for her to assist client in adjusting equipment item to proper height for client to utilize in bathroom of client.  Client and CSW spoke also of mental health needs of client.  CSW informed Jasdeep that CSW had spoken recently with Apolonio Schneiders, behavioral health caseworker for client at [facility].  Kymir said he had enjoyed weekly calls with Apolonio Schneiders and felt that these calls were helpful to client related to mental health needs of client. Client said he will see [name], primary doctor for client, on 07/07/14.  CSW encouraged client to inform [name] at client visit  with doctor, that client is currently having weekly support phone calls from behavioral health case manager at [facility]).  Brace said he would inform [name] that he is receiving weekly mental health support calls from case manager at [facility].  CSW also gave client three EMMI handouts for client to read:  "Fatigue"; "Stress Management" and "Panic Disorder."  Client was appreciative of EMMI handouts provided and said he will read these handouts.  CSW also talked with client about medications.  Client said he had prescribed medications.  He said he sometimes feels sleepy in the mornings and felt that it often took him several hours to fully wake up.  Client said he had been seeing [name] for over 20 years and that Dr.Daniel manages client's medications.  Client was appreciative of home visit by CSW on 07/05/14.   Plan:   Client to attend scheduled medical appointments and to take medications as prescribed. Client to continue to participate in scheduled calls with Apolonio Schneiders, behavioral health case manager from [employer]. Client to communicate with [name] related to medical needs of client. Client to communicate with RN [name] related to nursing needs of client. CSW to call client in three weeks to assess needs of client at that time.   [name].Tinna Kolker MSW, LCSW Licensed Clinical Social Worker [facility] Care Management [phone]

## 2014-07-05 NOTE — Progress Notes (Signed)
Cardiology Office Note  Date: 07/05/2014   ID: Allen, Wright 06/01/1950, MRN [id number]  PCP: Gar Ponto, MD  Primary Cardiologist: [name], MD   Chief Complaint  Patient presents with  . Status post AVR  . Atrial Fibrillation    History of Present Illness: [name] is a medically complex 64 y.o. male last seen in September 2015, he missed his follow-up visit. He states that he has been doing fairly well, follows with [name] approximately every 3 months, also follows Coumadin with [name]. We discussed his cardiac medications outlined below, there have been no significant changes since our last encounter with adjustments made. I reviewed his follow-up lab work done at [facility] in February, outlined below.  Blood pressure is in good range today, he states that it has been somewhat "low" recently, but not clearly symptom provoking.  He states that he has had some trouble with "lymph nodes" and describes CT scans done recently at [facility], followed by [name]. I do not have any other details at this time.   Past Medical History  Diagnosis Date  . Hyperlipidemia   . Aortic stenosis     Status post AVR 5/13  . Coronary atherosclerosis of native coronary artery     Nonobstructive  . Dyslipidemia   . Fibromyalgia   . Peripheral vascular disease   . Tinnitus     Following prior seizures  . Seizures     6 yrs ago from withdrawal of Xanax to quickly  . Long-term memory loss   . Stroke   . Arthritis   . Chronic back pain     Lumbar spondylosis  . GERD (gastroesophageal reflux disease)   . Hx of colonic polyps   . Nocturia   . Major depressive disorder     Prior suicidal attempts  . Anxiety   . Thoracic aortic aneurysm     Status post repair 7/13  . Atrial fibrillation     Past Surgical History  Procedure Laterality Date  . Inguinal exploration      Right  . Femoral hernia repair      x2  . Umbilical hernia repair      [facility]  . Finger  contracture release      Tendon release operation on his right little finger  . Appendectomy    . Cholecystectomy      [facility]  . Incisional hernia repair  05/01/2011    Procedure: HERNIA REPAIR INCISIONAL;  Surgeon: [name], MD;  Location: [facility];  Service: General;  Laterality: N/A;  with Mesh  . Tonsillectomy      at age 41  . Circumcision    . Cardiac catheterization    . Colonoscopy    . Esophagogastroduodenoscopy  04/07/08/13  . Aortic valve replacement  08/15/2011    Procedure: AORTIC VALVE REPLACEMENT (AVR);  Surgeon: [name], MD;  Location: [facility];  Service: Open Heart Surgery;  Laterality: N/A;  . Sternotomy  10/07/2011    Procedure: STERNOTOMY;  Surgeon: [name], MD;  Location: [place];  Service: Open Heart Surgery;  Laterality: N/A;  . Thoracic aortic aneurysm repair  10/07/2011    Procedure: THORACIC ASCENDING ANEURYSM REPAIR (AAA);  Surgeon: [name], MD;  Location: [place] [facility];  Service: Open Heart Surgery;  Laterality: N/A;  . Mediastinal exploration  10/08/2011    Procedure: MEDIASTINAL EXPLORATION;  Surgeon: [name], MD;  Location: [place];  Service: Open  Heart Surgery;  Laterality: N/A;    Current Outpatient Prescriptions  Medication Sig Dispense Refill  . ALPRAZolam (XANAX) 1 MG tablet Take 1 mg by mouth 5 (five) times daily as needed for anxiety.     [name] atorvastatin (LIPITOR) 40 MG tablet Take 40 mg by mouth daily.     [name] FLUoxetine (PROZAC) 20 MG capsule Take 1 capsule by mouth daily.    [name] levothyroxine (SYNTHROID, LEVOTHROID) 50 MCG tablet Take 50 mcg by mouth daily.    [name] lisinopril-hydrochlorothiazide (PRINZIDE) 10-12.5 MG per tablet Take 1 tablet by mouth daily. 90 tablet 1  . loratadine (CLARITIN) 10 MG tablet Take 10 mg by mouth daily.    . metoprolol tartrate (LOPRESSOR) 25 MG tablet TAKE ONE TABLET BY MOUTH THREE TIMES DAILY 90 tablet 6  . NITROSTAT 0.4 MG SL tablet Place 0.4 mg under the tongue every 5 (five) minutes as  needed for chest pain.     [name] oxyCODONE-acetaminophen (PERCOCET/ROXICET) 5-325 MG per tablet Take 1 tablet by mouth 4 (four) times daily as needed for severe pain.    . pantoprazole (PROTONIX) 40 MG tablet Take 40 mg by mouth daily.      . potassium chloride (K-DUR) 10 MEQ tablet Take 1 tablet (10 mEq total) by mouth daily. 90 tablet 3  . promethazine (PHENERGAN) 25 MG tablet Take 25 mg by mouth every 6 (six) hours as needed. For nausea    . warfarin (COUMADIN) 2 MG tablet Take 1-1.5 tablets (2-3 mg total) by mouth daily. Patient takes 1 tablet(2mg ) on Saturday and Wednesday and 1&1/2(3mg ) all other days 20 tablet 0   No current facility-administered medications for this visit.    Allergies:  Bupropion   Social History: The patient  reports that he has been smoking Cigarettes.  He started smoking about 51 years ago. He has a 24 pack-year smoking history. He has never used smokeless tobacco. He reports that he does not drink alcohol or use illicit drugs.   ROS:  Please see the history of present illness. Otherwise, complete review of systems is positive for none.  All other systems are reviewed and negative.   Physical Exam: VS:  BP 128/80 mmHg  Pulse 55  Ht 6' (1.829 m)  Wt 225 lb 12.8 oz (102.422 kg)  BMI 30.62 kg/m2  SpO2 98%, BMI Body mass index is 30.62 kg/(m^2).  Wt Readings from Last 3 Encounters:  07/05/14 225 lb 12.8 oz (102.422 kg)  12/01/13 227 lb (102.967 kg)  04/30/13 212 lb (96.163 kg)     Chronically ill-appearing, no distress. HEENT: Conjunctiva and lids normal, oropharynx clear.  Neck: Supple, no elevated JVP or carotid bruits, no thyromegaly.  Lungs: Clear to auscultation, nonlabored breathing at rest.  Cardiac: Regular rate and rhythm, loud crisp prosthetic valve click in S2, no S3, soft systolic murmur, no pericardial rub.  Abdomen: Soft, protuberant, nontender, bowel sounds present. Skin: Warm and dry.  Extremities: Trace ankle edema and venous stasis,  spider veins, distal pulses 2+.    ECG: ECG is not ordered today.   Recent Labwork:  05/06/2014 ([facility]) cholesterol 214, triglycerides 263, HDL 23, LDL 138, BUN 13, creatinine 1.4, potassium 3.5, AST 22, ALT 20.  Other Studies Reviewed Today:  1. CT angiogram of the chest from February 2015 showed stable tube graft repair of the ascending aorta with AVR, no description of apparent complications.   2. Echocardiogram in March 2014 demonstrated mild LVH with LVEF 50-55%, septal motion consistent with postoperative state,  mid to basal inferoseptal hypokinesis, well-seated mechanical aortic prosthesis with mean gradient 7 mm mercury, no significant mitral regurgitation, trace tricuspid regurgitation with RVSP 25-30 mm mercury, no pericardial effusion, mildly dilated ascending aorta.   Assessment and Plan:  1. Status post mechanical AVR due to aortic stenosis, stable on examination with last echocardiogram from 2014 noted above. He continues on Coumadin which is followed by [name].  2. Status post emergent thoracic aortic aneurysm repair by Dr. Roxan Hockey in 2013, stable by chest CT angiogram from last year.  3. Essential hypertension, blood pressure control is reasonable today. No changes made in current regimen. Follow-up lab work reviewed.  4. Hyperlipidemia, recent LDL 138, on Lipitor. This has been followed by [name].  5. History of nonobstructive CAD.  Current medicines were reviewed with the patient today.   Disposition: FU with me in 6 months.   Signed, Satira Sark, MD, [facility] 07/05/2014 3:46 PM    [facility] [place], [place], [place] [address] Phone: [phone]; Fax: [phone]

## 2014-07-05 NOTE — Patient Instructions (Signed)

## 2014-07-05 NOTE — Patient Instructions (Signed)
Client to communicate with RN [name] related to nursing needs of client.   Client to communicate, as needed, with [name] related to medical needs of client.  Client to continue to participate in scheduled calls with Apolonio Schneiders, behavioral health case manager from [employer].  Client to call CSW at [phone] as needed for support in addressing social work needs of client.

## 2014-07-06 DIAGNOSIS — Z720 Tobacco use: Secondary | ICD-10-CM | POA: Diagnosis not present

## 2014-07-06 DIAGNOSIS — I2510 Atherosclerotic heart disease of native coronary artery without angina pectoris: Secondary | ICD-10-CM | POA: Diagnosis not present

## 2014-07-06 DIAGNOSIS — E669 Obesity, unspecified: Secondary | ICD-10-CM | POA: Diagnosis not present

## 2014-07-06 DIAGNOSIS — F331 Major depressive disorder, recurrent, moderate: Secondary | ICD-10-CM | POA: Diagnosis not present

## 2014-07-06 DIAGNOSIS — J449 Chronic obstructive pulmonary disease, unspecified: Secondary | ICD-10-CM | POA: Diagnosis not present

## 2014-07-06 DIAGNOSIS — K219 Gastro-esophageal reflux disease without esophagitis: Secondary | ICD-10-CM | POA: Diagnosis not present

## 2014-07-06 DIAGNOSIS — Z9181 History of falling: Secondary | ICD-10-CM | POA: Diagnosis not present

## 2014-07-06 DIAGNOSIS — M545 Low back pain: Secondary | ICD-10-CM | POA: Diagnosis not present

## 2014-07-06 DIAGNOSIS — K589 Irritable bowel syndrome without diarrhea: Secondary | ICD-10-CM | POA: Diagnosis not present

## 2014-07-07 ENCOUNTER — Other Ambulatory Visit: Payer: Self-pay | Admitting: *Deleted

## 2014-07-07 ENCOUNTER — Ambulatory Visit: Payer: Medicare PPO | Admitting: *Deleted

## 2014-07-07 NOTE — Patient Outreach (Addendum)
07/07/14- Previous documentation in [facility] system,  RN CM called pt to remind him of today's scheduled home visit (HIPAA verified) and pt states he has headache and does not feel like having a visit.  Pt reports physical therapist continues working with him and pt has needed equipment provided by [facility] CSW and pt states " this has been so helpful". Pt reports he has had no falls, has all medications and taking as prescribed, attending all appointments, reports [employer] case manager calling him weekly for support with depression and [facility] CSW continues to work with pt on issue of depression, pt states " that's my biggest problem".  RN CM talked with pt about discharge from nursing services as there are no further nursing goals pt requests to work on, pt agreeable with discharge from nursing services only but would like to continue working with CSW at present. Case closed, sent note to [facility] and [place] of nursing discharge,  Requested that [facility] CSW send case closure to MD upon final discharge.

## 2014-07-08 DIAGNOSIS — I359 Nonrheumatic aortic valve disorder, unspecified: Secondary | ICD-10-CM | POA: Diagnosis not present

## 2014-07-08 DIAGNOSIS — I2510 Atherosclerotic heart disease of native coronary artery without angina pectoris: Secondary | ICD-10-CM | POA: Diagnosis not present

## 2014-07-08 DIAGNOSIS — J449 Chronic obstructive pulmonary disease, unspecified: Secondary | ICD-10-CM | POA: Diagnosis not present

## 2014-07-08 DIAGNOSIS — Z720 Tobacco use: Secondary | ICD-10-CM | POA: Diagnosis not present

## 2014-07-08 DIAGNOSIS — E782 Mixed hyperlipidemia: Secondary | ICD-10-CM | POA: Diagnosis not present

## 2014-07-08 DIAGNOSIS — J301 Allergic rhinitis due to pollen: Secondary | ICD-10-CM | POA: Diagnosis not present

## 2014-07-08 DIAGNOSIS — E039 Hypothyroidism, unspecified: Secondary | ICD-10-CM | POA: Diagnosis not present

## 2014-07-08 DIAGNOSIS — F331 Major depressive disorder, recurrent, moderate: Secondary | ICD-10-CM | POA: Diagnosis not present

## 2014-07-08 DIAGNOSIS — E876 Hypokalemia: Secondary | ICD-10-CM | POA: Diagnosis not present

## 2014-07-11 ENCOUNTER — Encounter: Payer: Self-pay | Admitting: Pharmacist

## 2014-07-11 DIAGNOSIS — Z9181 History of falling: Secondary | ICD-10-CM | POA: Diagnosis not present

## 2014-07-11 DIAGNOSIS — M545 Low back pain: Secondary | ICD-10-CM | POA: Diagnosis not present

## 2014-07-11 DIAGNOSIS — J449 Chronic obstructive pulmonary disease, unspecified: Secondary | ICD-10-CM | POA: Diagnosis not present

## 2014-07-11 DIAGNOSIS — K219 Gastro-esophageal reflux disease without esophagitis: Secondary | ICD-10-CM | POA: Diagnosis not present

## 2014-07-11 DIAGNOSIS — I2510 Atherosclerotic heart disease of native coronary artery without angina pectoris: Secondary | ICD-10-CM | POA: Diagnosis not present

## 2014-07-11 DIAGNOSIS — F331 Major depressive disorder, recurrent, moderate: Secondary | ICD-10-CM | POA: Diagnosis not present

## 2014-07-11 DIAGNOSIS — E669 Obesity, unspecified: Secondary | ICD-10-CM | POA: Diagnosis not present

## 2014-07-11 DIAGNOSIS — K589 Irritable bowel syndrome without diarrhea: Secondary | ICD-10-CM | POA: Diagnosis not present

## 2014-07-11 DIAGNOSIS — Z720 Tobacco use: Secondary | ICD-10-CM | POA: Diagnosis not present

## 2014-07-11 NOTE — Patient Outreach (Signed)
[name]) Care Management  [place]   07/11/2014  [name] [birth year] [id number]  Subjective: Pt is a 64 y.o. male referred to Pharmacy for a medication review, specifically looking for medications that may contribute to patient's slurred speech and lethargy.  Objective:   Current Medications: Current Outpatient Prescriptions  Medication Sig Dispense Refill  . ALPRAZolam (XANAX) 1 MG tablet Take 1 mg by mouth 5 (five) times daily as needed for anxiety.     [name] atorvastatin (LIPITOR) 40 MG tablet Take 40 mg by mouth daily.     [name] FLUoxetine (PROZAC) 20 MG capsule Take 1 capsule by mouth daily.    [name] levothyroxine (SYNTHROID, LEVOTHROID) 50 MCG tablet Take 50 mcg by mouth daily.    [name] lisinopril-hydrochlorothiazide (PRINZIDE) 10-12.5 MG per tablet Take 1 tablet by mouth daily. 90 tablet 1  . loratadine (CLARITIN) 10 MG tablet Take 10 mg by mouth daily.    . metoprolol tartrate (LOPRESSOR) 25 MG tablet TAKE ONE TABLET BY MOUTH THREE TIMES DAILY 90 tablet 6  . NITROSTAT 0.4 MG SL tablet Place 0.4 mg under the tongue every 5 (five) minutes as needed for chest pain.     [name] oxyCODONE-acetaminophen (PERCOCET/ROXICET) 5-325 MG per tablet Take 1 tablet by mouth 4 (four) times daily as needed for severe pain.    . pantoprazole (PROTONIX) 40 MG tablet Take 40 mg by mouth daily.      . potassium chloride (K-DUR) 10 MEQ tablet Take 1 tablet (10 mEq total) by mouth daily. 90 tablet 3  . promethazine (PHENERGAN) 25 MG tablet Take 25 mg by mouth every 6 (six) hours as needed. For nausea    . warfarin (COUMADIN) 2 MG tablet Take 1-1.5 tablets (2-3 mg total) by mouth daily. Patient takes 1 tablet(2mg ) on Saturday and Wednesday and 1&1/2(3mg ) all other days 20 tablet 0   No current facility-administered medications for this visit.    Functional Status: In your present state of health, do you have any difficulty performing the following activities: 07/05/2014  Hearing? N  Vision?  N  Difficulty concentrating or making decisions? Y  Walking or climbing stairs? Y  Dressing or bathing? Y  Doing errands, shopping? Y  Preparing Food and eating ? Y  Using the Toilet? Y  In the past six months, have you accidently leaked urine? N  Do you have problems with loss of bowel control? N  Managing your Medications? N  Managing your Finances? Y  Housekeeping or managing your Housekeeping? Y    Fall/Depression Screening: PHQ 2/9 Scores 07/05/2014  PHQ - 2 Score 2  PHQ- 9 Score 21    Assessment:  Reviewed patient's medication list in EPIC. Also spoke with patient's pharmacy, [facility], to find out if patient had any recent medication changes not captured by EPIC.   Found that patient has had two medication doses that are not captured on his EPIC record. His fluoxetine dose has been increased from 40 mg daily to 40 mg twice daily. This gives him a total dose of 80 mg per day, the maximum dose for this medication. Patient is also taking gabapentin 100 mg, 1 tablet by mouth three times daily. Per his pharmacy patient has been receiving his promethazine, which is prescribed to be used as needed, on a monthly basis.  Of the patient's current medications, he has several that can contribute to CNS depression, including: fluoxetine, gabapentin, Percocet, alprazolam and promethazine.   Plan:  Will call  patient's PCP today to discuss the patient's multiple CNS depressant medications and their potential to contribute to the patient's lethargy.   Harlow Asa, PharmD Clinical Pharmacist [facility] Management [phone]

## 2014-07-12 ENCOUNTER — Other Ambulatory Visit: Payer: Self-pay | Admitting: Licensed Clinical Social Worker

## 2014-07-12 NOTE — Patient Outreach (Signed)
  Assessment:  CSW completed chart review on client on 07/12/14. CSW called client home phone number on 07/12/14 and spoke via phone with client on 07/12/14.  CSW verified identity of client.  CSW and client spoke of client support from [place] physical therapist.  Client said that physical therapist from [place] conducts visits with client two times weekly at present.  Client said he had appointment with Dr. Gar Ponto last week.  Client said: "doctor doubled my depression medicine."  Client said:  " At times I feel sleepy and at other times I feel wide awake."  Client said he had appointment with Dr Domenic Polite, cardiologist, last week.  Client said appointment with Dr. Domenic Polite went well with no problems noted.  Client said he uses cane often to help him walk.  He said Apolonio Schneiders from [employer] (case manager) called him recently and that these calls from [place] were very helpful to him. He said he spoke with Apolonio Schneiders about depression and sadness issues for client.   CSW talked with client about support, socialization from friends or neighbors.  Client said he had one friend who helped him periodically with obtaining grocery items for client.  Client said he has another friend who will help client obtain household items needed.  Client said he has prescribed medications.  Client said he sometimes has trouble falling asleep and feels as if his sleep cycle is somewhat mixed up.  Client said: "I feel as if my days and nights are mixed up."  Client said he is attending scheduled medical appointments and is taking medications as prescribed. CSW thanked client for phone conversation on 07/12/14.  Client was appreciative of phone call from [place] on 07/12/14.  Client said:  "I want to thank you all for your program; I feel that your program has helped me a great deal.  I do not have anyone else to help me. Thank you."     Plan: Client to take medications as prescribed and to attend scheduled  medical appointments. Client to communicate, as scheduled, via phone with Apolonio Schneiders, [place] case manager from [place]. CSW to contact client next week to assess status and needs of client.  [name].Eryka Dolinger MSW, LCSW Licensed Clinical Social Worker [facility] Care Management [phone]

## 2014-07-13 ENCOUNTER — Other Ambulatory Visit: Payer: Self-pay | Admitting: Pharmacist

## 2014-07-13 DIAGNOSIS — F331 Major depressive disorder, recurrent, moderate: Secondary | ICD-10-CM | POA: Diagnosis not present

## 2014-07-13 DIAGNOSIS — Z9181 History of falling: Secondary | ICD-10-CM | POA: Diagnosis not present

## 2014-07-13 DIAGNOSIS — K589 Irritable bowel syndrome without diarrhea: Secondary | ICD-10-CM | POA: Diagnosis not present

## 2014-07-13 DIAGNOSIS — E669 Obesity, unspecified: Secondary | ICD-10-CM | POA: Diagnosis not present

## 2014-07-13 DIAGNOSIS — M545 Low back pain: Secondary | ICD-10-CM | POA: Diagnosis not present

## 2014-07-13 DIAGNOSIS — J449 Chronic obstructive pulmonary disease, unspecified: Secondary | ICD-10-CM | POA: Diagnosis not present

## 2014-07-13 DIAGNOSIS — I2510 Atherosclerotic heart disease of native coronary artery without angina pectoris: Secondary | ICD-10-CM | POA: Diagnosis not present

## 2014-07-13 DIAGNOSIS — K219 Gastro-esophageal reflux disease without esophagitis: Secondary | ICD-10-CM | POA: Diagnosis not present

## 2014-07-13 DIAGNOSIS — Z720 Tobacco use: Secondary | ICD-10-CM | POA: Diagnosis not present

## 2014-07-13 NOTE — Patient Outreach (Signed)
Called to speak with Mr. Taul PCP, [name], about patient's medications and their potential contribution to patient's lethargy and slurred speech. Addressed with provider patient's medications that cause CNS depression, including fluoxetine, gabapentin, Percocet, alprazolam and promethazine.  Provider states that patient has seen a psychiatrist multiple times in the past, but does not do so currently. Discussed with the provider patient's recently increased dose of fluoxetine to the maximum dose of 80 mg/day. Expressed concern to provider about patient's long term use of "as needed" alprazolam 1 mg five times daily. Provider stated that patient has been on this same dose and schedule of alprazolam for 10 years and he does not believe that changing this medication would be a reasonable for the patient at this time. We spoke about potentially referring the patient back to a psychiatrist in the future.  Regarding patient's pain management, [name] states that he has discussed a pain management referral with the patient in the past. States that since changing the patient from hydrocodone/acetaminophen to oxycodone/acetaminophen (Controlled Substance Reporting System records indicate this occurred in February 2016) he has maintained patient on the same dose of oxycodone/acetaminophen 5/325 mg 1 tablet four times daily as needed, despite patient's request for dose increase. Asked provider if he would consider referring patient to pain management clinic at this time. Provider agreed that this could be useful for the patient and will have his office call to initiate this.  Also asked [name] about patient's promethazine which is prescribed to be used as needed. Per the pharmacist at [facility], patient has been picking this prescription up every month. Provider stated that he was not aware that patient was using this medication this frequently. He stated that he would address this with the  patient.  Provided provider with my number and asked for him to give me a call following his discussion with Mr. Weekes about the pain management clinic referral and his promethazine use. If I have not heard back from [name] by  07/18/14, will follow up with him again at that time.  Harlow Asa, PharmD Clinical Pharmacist [place] [facility] [phone]

## 2014-07-14 DIAGNOSIS — I240 Acute coronary thrombosis not resulting in myocardial infarction: Secondary | ICD-10-CM | POA: Diagnosis not present

## 2014-07-14 DIAGNOSIS — I359 Nonrheumatic aortic valve disorder, unspecified: Secondary | ICD-10-CM | POA: Diagnosis not present

## 2014-07-18 ENCOUNTER — Other Ambulatory Visit: Payer: Self-pay | Admitting: Licensed Clinical Social Worker

## 2014-07-18 ENCOUNTER — Other Ambulatory Visit: Payer: Self-pay | Admitting: Pharmacist

## 2014-07-18 NOTE — Patient Outreach (Signed)
Called to follow up with Mr. Girton PCP, [name] about provider's plans to refer patient to a Pain Management Clinic and to discuss with patient his ongoing use of promethazine. Previously discussed these issues with [name] on 07/13/14. Left a message with clinic staff. If have not heard from provider by 07/20/14, will follow up again at that time.   Harlow Asa, PharmD Clinical Pharmacist [place] [facility] [phone]

## 2014-07-18 NOTE — Patient Outreach (Signed)
[facility]) Care Management  [facility] Social Work  07/18/2014  [name] Dec 02, 1950 [id number]  Subjective:    Objective:   Current Medications:  Current Outpatient Prescriptions  Medication Sig Dispense Refill  . ALPRAZolam (XANAX) 1 MG tablet Take 1 mg by mouth 5 (five) times daily as needed for anxiety.     [name] atorvastatin (LIPITOR) 40 MG tablet Take 40 mg by mouth daily.     [name] FLUoxetine (PROZAC) 20 MG capsule Take 1 capsule by mouth daily.    [name] gabapentin (NEURONTIN) 100 MG capsule Take 100 mg by mouth 3 (three) times daily.    [name] levothyroxine (SYNTHROID, LEVOTHROID) 50 MCG tablet Take 50 mcg by mouth daily.    [name] lisinopril-hydrochlorothiazide (PRINZIDE) 10-12.5 MG per tablet Take 1 tablet by mouth daily. 90 tablet 1  . loratadine (CLARITIN) 10 MG tablet Take 10 mg by mouth daily.    . metoprolol tartrate (LOPRESSOR) 25 MG tablet TAKE ONE TABLET BY MOUTH THREE TIMES DAILY 90 tablet 6  . NITROSTAT 0.4 MG SL tablet Place 0.4 mg under the tongue every 5 (five) minutes as needed for chest pain.     [name] oxyCODONE-acetaminophen (PERCOCET/ROXICET) 5-325 MG per tablet Take 1 tablet by mouth 4 (four) times daily as needed for severe pain.    . pantoprazole (PROTONIX) 40 MG tablet Take 40 mg by mouth daily.      . potassium chloride (K-DUR) 10 MEQ tablet Take 1 tablet (10 mEq total) by mouth daily. 90 tablet 3  . promethazine (PHENERGAN) 25 MG tablet Take 25 mg by mouth every 6 (six) hours as needed. For nausea    . warfarin (COUMADIN) 2 MG tablet Take 1-1.5 tablets (2-3 mg total) by mouth daily. Patient takes 1 tablet(2mg ) on Saturday and Wednesday and 1&1/2(3mg ) all other days 20 tablet 0   No current facility-administered medications for this visit.    Functional Status:  In your present state of health, do you have any difficulty performing the following activities: 07/05/2014  Hearing? N  Vision? N  Difficulty concentrating or making decisions? Y  Walking or climbing  stairs? Y  Dressing or bathing? Y  Doing errands, shopping? Y  Preparing Food and eating ? Y  Using the Toilet? Y  In the past six months, have you accidently leaked urine? N  Do you have problems with loss of bowel control? N  Managing your Medications? N  Managing your Finances? Y  Housekeeping or managing your Housekeeping? Y    Fall/Depression Screening:  PHQ 2/9 Scores 07/05/2014  PHQ - 2 Score 2  PHQ- 9 Score 21    Assessment:   Previous documentation in [facility] record.  CSW met client on 07/18/14 at home of client for a routine home visit.  Client has been receiving in home support (physical therapy support) as scheduled weekly through [facility].  Client has a car which he is able to drive to nearby medical appointments and can use to complete needed errands. Client has little family support.  Client has cane, bedside commode and a rolling walker on loan from the [facility] supply closet in [place], [place]. Client said equipment items are very helpful to client at present.  CSW talked with client about Mini Mental Status. Client said he was willing to complete Mini Mental Status Examination on 07/18/14.  CSW administered the Mini Mental Status Examination to client on 07/18/14.  Client scored 27/30 on Mini Mental Status Examination.  Client said copying  the design was the hardest part of the test for him to complete.  CSW informed client that CSW would inform [name], primary doctor for client, of the Mini Mental Status Examination score of client.  Client agreed for CSW to share test result of Mini Mental Status Examination with [name], primary doctor of client.  Client said he had seen [name] for an appointment about two weeks ago.  Client said he has his prescribed medications and is trying to take medications as prescribed.  Client said he has one neighbor, Clancy, who visits him and helps client occasionally with errands.  Client said he is eating well and has adequate food  supply. Client said he has been receiving calls from [place], Catering manager, with [employer].  He said she has been calling [place] about one time weekly.  He said he talks with Apolonio Schneiders about a number of things but he is having difficulty remembering the items he discussed with Apolonio Schneiders.  He said he had gone to counseling years ago and had worked with a Teacher, music; but, he was recommended to participate in group counseling and he did not feel comfortable with being in group counseling.  He said he thought calls from [place], with [employer], were helpful to him.  He said physical therapist, Jackelyn Poling, from [facility], will call him before conducting her home visit with client this week. Client receives his prescribed medications from [facility] in [place], [place].     Plan:   Client to take medications as prescribed and to attend scheduled medical appointments.  Client to cooperate with physical therapist, Jackelyn Poling, from [facility] during scheduled physical therapy home sessions. CSW to inform Dr. Gar Ponto on 07/18/14 of the Mini Mental Status Examination score of client (27/30). CSW to call client in two weeks to assess needs of client at that time.  [name].Lucetta Baehr MSW, LCSW Licensed Clinical Social Worker [facility] Care Management [phone]

## 2014-07-19 DIAGNOSIS — Z9181 History of falling: Secondary | ICD-10-CM | POA: Diagnosis not present

## 2014-07-19 DIAGNOSIS — E669 Obesity, unspecified: Secondary | ICD-10-CM | POA: Diagnosis not present

## 2014-07-19 DIAGNOSIS — J449 Chronic obstructive pulmonary disease, unspecified: Secondary | ICD-10-CM | POA: Diagnosis not present

## 2014-07-19 DIAGNOSIS — K219 Gastro-esophageal reflux disease without esophagitis: Secondary | ICD-10-CM | POA: Diagnosis not present

## 2014-07-19 DIAGNOSIS — K589 Irritable bowel syndrome without diarrhea: Secondary | ICD-10-CM | POA: Diagnosis not present

## 2014-07-19 DIAGNOSIS — M545 Low back pain: Secondary | ICD-10-CM | POA: Diagnosis not present

## 2014-07-19 DIAGNOSIS — Z720 Tobacco use: Secondary | ICD-10-CM | POA: Diagnosis not present

## 2014-07-19 DIAGNOSIS — F331 Major depressive disorder, recurrent, moderate: Secondary | ICD-10-CM | POA: Diagnosis not present

## 2014-07-19 DIAGNOSIS — I2510 Atherosclerotic heart disease of native coronary artery without angina pectoris: Secondary | ICD-10-CM | POA: Diagnosis not present

## 2014-07-20 NOTE — Patient Outreach (Signed)
Received a message from patient's PCP, Dr. Gar Ponto. [name] reports that he referred Mr. Gillen to Dr. Francesco Runner for pain management last week. I have also communicated this information to Social Worker [name].Glory [place], [place] Care Management [phone]

## 2014-07-21 ENCOUNTER — Encounter: Payer: Self-pay | Admitting: Licensed Clinical Social Worker

## 2014-07-25 DIAGNOSIS — E669 Obesity, unspecified: Secondary | ICD-10-CM | POA: Diagnosis not present

## 2014-07-25 DIAGNOSIS — Z720 Tobacco use: Secondary | ICD-10-CM | POA: Diagnosis not present

## 2014-07-25 DIAGNOSIS — J449 Chronic obstructive pulmonary disease, unspecified: Secondary | ICD-10-CM | POA: Diagnosis not present

## 2014-07-25 DIAGNOSIS — K589 Irritable bowel syndrome without diarrhea: Secondary | ICD-10-CM | POA: Diagnosis not present

## 2014-07-25 DIAGNOSIS — Z9181 History of falling: Secondary | ICD-10-CM | POA: Diagnosis not present

## 2014-07-25 DIAGNOSIS — K219 Gastro-esophageal reflux disease without esophagitis: Secondary | ICD-10-CM | POA: Diagnosis not present

## 2014-07-25 DIAGNOSIS — M545 Low back pain: Secondary | ICD-10-CM | POA: Diagnosis not present

## 2014-07-25 DIAGNOSIS — F331 Major depressive disorder, recurrent, moderate: Secondary | ICD-10-CM | POA: Diagnosis not present

## 2014-07-25 DIAGNOSIS — I2510 Atherosclerotic heart disease of native coronary artery without angina pectoris: Secondary | ICD-10-CM | POA: Diagnosis not present

## 2014-07-26 ENCOUNTER — Encounter: Payer: Self-pay | Admitting: Licensed Clinical Social Worker

## 2014-07-26 NOTE — Patient Outreach (Signed)
  [facility]) Care Management  [facility] Social Work  07/26/2014  [name] 1950/09/26 [id number]  Subjective:    Objective:   Current Medications:  Current Outpatient Prescriptions  Medication Sig Dispense Refill  . ALPRAZolam (XANAX) 1 MG tablet Take 1 mg by mouth 5 (five) times daily as needed for anxiety.     [name] atorvastatin (LIPITOR) 40 MG tablet Take 40 mg by mouth daily.     [name] FLUoxetine (PROZAC) 20 MG capsule Take 1 capsule by mouth daily.    [name] gabapentin (NEURONTIN) 100 MG capsule Take 100 mg by mouth 3 (three) times daily.    [name] levothyroxine (SYNTHROID, LEVOTHROID) 50 MCG tablet Take 50 mcg by mouth daily.    [name] lisinopril-hydrochlorothiazide (PRINZIDE) 10-12.5 MG per tablet Take 1 tablet by mouth daily. 90 tablet 1  . loratadine (CLARITIN) 10 MG tablet Take 10 mg by mouth daily.    . metoprolol tartrate (LOPRESSOR) 25 MG tablet TAKE ONE TABLET BY MOUTH THREE TIMES DAILY 90 tablet 6  . NITROSTAT 0.4 MG SL tablet Place 0.4 mg under the tongue every 5 (five) minutes as needed for chest pain.     [name] oxyCODONE-acetaminophen (PERCOCET/ROXICET) 5-325 MG per tablet Take 1 tablet by mouth 4 (four) times daily as needed for severe pain.    . pantoprazole (PROTONIX) 40 MG tablet Take 40 mg by mouth daily.      . potassium chloride (K-DUR) 10 MEQ tablet Take 1 tablet (10 mEq total) by mouth daily. 90 tablet 3  . promethazine (PHENERGAN) 25 MG tablet Take 25 mg by mouth every 6 (six) hours as needed. For nausea    . warfarin (COUMADIN) 2 MG tablet Take 1-1.5 tablets (2-3 mg total) by mouth daily. Patient takes 1 tablet(2mg ) on Saturday and Wednesday and 1&1/2(3mg ) all other days 20 tablet 0   No current facility-administered medications for this visit.    Functional Status:  In your present state of health, do you have any difficulty performing the following activities: 07/05/2014  Hearing? N  Vision? N  Difficulty concentrating or making decisions? Y  Walking or climbing  stairs? Y  Dressing or bathing? Y  Doing errands, shopping? Y  Preparing Food and eating ? Y  Using the Toilet? Y  In the past six months, have you accidently leaked urine? N  Do you have problems with loss of bowel control? N  Managing your Medications? N  Managing your Finances? Y  Housekeeping or managing your Housekeeping? Y    Fall/Depression Screening:  PHQ 2/9 Scores 07/05/2014  PHQ - 2 Score 2  PHQ- 9 Score 21    Assessment:  Previous documentation in [facility] record.  CSW completed chart review on client on 07/26/14.  CSW also spoke via phone with RN [name] on 07/26/14 regarding assessment intake information for client, [name].  CSW continues to communicate with client regularly to discuss current needs of client.  CSW recently administered Mini Mental Status Examination to client and client scored 27/30 on that test.  CSW communicated score information for client on this test to Dr. Quillian Wright, primary doctor for client.   Plan: Client to attend scheduled medical appointments. Client to continue to participate in weekly phone conversations with [facility] behavioral health case manager. CSW to contact client in two weeks to assess needs of client at that time.   [name].[name] MSW, LCSW Licensed Clinical Social Worker [facility] Care Management [phone]  Plan:

## 2014-07-28 ENCOUNTER — Encounter: Payer: Self-pay | Admitting: *Deleted

## 2014-07-28 NOTE — Patient Outreach (Signed)
[facility]) Care Management  07/28/2014  [name] 10-Oct-1950 [id number]   07/28/14- Documentation for evaluation of available benefits and evaluation of caregiver resources as late entry for 05/27/14- completed on initial home visit assessment.  [name] [facility], [place] Coordinator [phone]

## 2014-08-10 NOTE — Patient Outreach (Signed)
No further medication issues for pharmacy to address at this time.   Harlow Asa, PharmD Clinical Pharmacist [place] Management [phone]

## 2014-08-11 DIAGNOSIS — Z79891 Long term (current) use of opiate analgesic: Secondary | ICD-10-CM | POA: Diagnosis not present

## 2014-08-11 DIAGNOSIS — M47816 Spondylosis without myelopathy or radiculopathy, lumbar region: Secondary | ICD-10-CM | POA: Diagnosis not present

## 2014-08-11 DIAGNOSIS — M545 Low back pain: Secondary | ICD-10-CM | POA: Diagnosis not present

## 2014-08-11 DIAGNOSIS — M5136 Other intervertebral disc degeneration, lumbar region: Secondary | ICD-10-CM | POA: Diagnosis not present

## 2014-08-11 DIAGNOSIS — M9983 Other biomechanical lesions of lumbar region: Secondary | ICD-10-CM | POA: Diagnosis not present

## 2014-08-11 DIAGNOSIS — M47812 Spondylosis without myelopathy or radiculopathy, cervical region: Secondary | ICD-10-CM | POA: Diagnosis not present

## 2014-08-18 ENCOUNTER — Other Ambulatory Visit: Payer: Self-pay | Admitting: Licensed Clinical Social Worker

## 2014-08-18 NOTE — Patient Outreach (Signed)
  Assessment:  On 08/18/14, CSW called Angela Nevin, Mudlogger of [employer] in [place], [place] to discuss current needs of [name]. CSW and Angela Nevin spoke of socialization activities for clients at the [facility] in [place], [place].  [place] gave Angela Nevin the name, address and phone number of client; and, CSW asked if Angela Nevin could call [name] and discuss [place] with [name] to see if he might be interested in participating in such activities. CSW informed Angela Nevin that [name] had a reliable car to drive, used a cane to assist in ambulation and could benefit from increased socialization activities.  CSW informed Angela Nevin that [name] has decreased support from family or friends. Angela Nevin thanked CSW for this information and said she would plan to call [name] on 08/18/14 to invite him to the [facility] and to inform him of activities at [employer] [facility].  CSW then called [name] on 08/18/14 and spoke via phone with client.  CSW verified identity of client.  CSW informed [name] that Angela Nevin, Director of local senior center would likely call him on 08/18/14 to discuss activities at the [facility].  CSW encouraged [name] to communicate with Angela Nevin to learn about [place] activities and to consider participating in activities of choice at [employer].  [name] said he would talk with Angela Nevin about [facility] activities.  [name] informed CSW on 08/18/14 that [name] from [facility] services called him yesterday to speak of mental health needs of client.  [name] said call from [name] was very helpful and he thought calls from [name] were beneficial to him.  CSW informed [name] that [name] had communicated to [place] a list of counselors for possible support services for client. Most counselors on referral list were in the [place] area which is about a 35 minute drive one way from client.  Client likely would also have to pay copay amount to counselor seen on each visit and [name]  has said that it could be financially difficult for him to pay copay amounts to a counselor on each visit. [name] has decreased family support.  He has some limited social contact with friends.  CSW thanked [name] for phone conversation on 08/18/14. [name] was very appreciative of phone call from [place] on 08/18/14.    Plan: Client to take medications as prescribed and to attend scheduled medical appointments. Client to communicate via phone with Angela Nevin, Mudlogger at [employer], to discuss activities available at [facility] and to discuss possible client participation in activities at [facility]. Client to communicate with primary doctor, as needed, to discuss mental health needs of client.   CSW to call [name] in two weeks to assess needs of client at that time.  [name].Lucinda Spells MSW, LCSW Licensed Clinical Social Worker [facility] Care Management [phone]

## 2014-08-19 DIAGNOSIS — I719 Aortic aneurysm of unspecified site, without rupture: Secondary | ICD-10-CM | POA: Diagnosis not present

## 2014-08-19 DIAGNOSIS — I359 Nonrheumatic aortic valve disorder, unspecified: Secondary | ICD-10-CM | POA: Diagnosis not present

## 2014-09-02 ENCOUNTER — Other Ambulatory Visit: Payer: Self-pay | Admitting: Licensed Clinical Social Worker

## 2014-09-02 NOTE — Patient Outreach (Signed)
Assessment:  CSW called client on 09/02/14 and spoke via phone with client on 09/02/14.  CSW verified identity of client.  CSW spoke with client about setting up home visit appointment of CSW with client. CSW and client agreed for CSW to conduct home visit with client on 09/05/14 at 9:45 AM at home of client    Plan: Client to take medications as prescribed and attend scheduled medical appointments. Client to communicate via phone with [place] as scheduled. Client to communicate with primary doctor related to medical needs of client.    [name].Azul Brumett MSW, LCSW Licensed Clinical Social Worker [facility] Care Management [phone]

## 2014-09-05 ENCOUNTER — Other Ambulatory Visit: Payer: Self-pay | Admitting: Licensed Clinical Social Worker

## 2014-09-05 NOTE — Patient Outreach (Signed)
[facility]) Care Management  [facility] Social Work  09/05/2014  [name] 11-04-1950 [id number]  Subjective:    Objective:   Current Medications:  Current Outpatient Prescriptions  Medication Sig Dispense Refill  . ALPRAZolam (XANAX) 1 MG tablet Take 1 mg by mouth 5 (five) times daily as needed for anxiety.     [name] atorvastatin (LIPITOR) 40 MG tablet Take 40 mg by mouth daily.     [name] FLUoxetine (PROZAC) 20 MG capsule Take 1 capsule by mouth daily.    [name] gabapentin (NEURONTIN) 100 MG capsule Take 100 mg by mouth 3 (three) times daily.    [name] levothyroxine (SYNTHROID, LEVOTHROID) 50 MCG tablet Take 50 mcg by mouth daily.    [name] lisinopril-hydrochlorothiazide (PRINZIDE) 10-12.5 MG per tablet Take 1 tablet by mouth daily. 90 tablet 1  . loratadine (CLARITIN) 10 MG tablet Take 10 mg by mouth daily.    . metoprolol tartrate (LOPRESSOR) 25 MG tablet TAKE ONE TABLET BY MOUTH THREE TIMES DAILY 90 tablet 6  . NITROSTAT 0.4 MG SL tablet Place 0.4 mg under the tongue every 5 (five) minutes as needed for chest pain.     [name] oxyCODONE-acetaminophen (PERCOCET/ROXICET) 5-325 MG per tablet Take 1 tablet by mouth 4 (four) times daily as needed for severe pain.    . pantoprazole (PROTONIX) 40 MG tablet Take 40 mg by mouth daily.      . potassium chloride (K-DUR) 10 MEQ tablet Take 1 tablet (10 mEq total) by mouth daily. 90 tablet 3  . promethazine (PHENERGAN) 25 MG tablet Take 25 mg by mouth every 6 (six) hours as needed. For nausea    . warfarin (COUMADIN) 2 MG tablet Take 1-1.5 tablets (2-3 mg total) by mouth daily. Patient takes 1 tablet(2mg ) on Saturday and Wednesday and 1&1/2(3mg ) all other days 20 tablet 0   No current facility-administered medications for this visit.    Functional Status:  In your present state of health, do you have any difficulty performing the following activities: 07/05/2014  Hearing? N  Vision? N  Difficulty concentrating or making decisions? Y  Walking or climbing  stairs? Y  Dressing or bathing? Y  Doing errands, shopping? Y  Preparing Food and eating ? Y  Using the Toilet? Y  In the past six months, have you accidently leaked urine? N  Do you have problems with loss of bowel control? N  Managing your Medications? N  Managing your Finances? Y  Housekeeping or managing your Housekeeping? Y    Fall/Depression Screening:  PHQ 2/9 Scores 07/05/2014  PHQ - 2 Score 2  PHQ- 9 Score 21    Assessment:   CSW [employer] and CSW Jettie Booze met with client on 09/05/14 at home of client.  Client said he had his nights and  days mixed up and is not sleeping very well.  He said he takes several naps during the day.  He said he uses his cane to help him ambulate and does have a walker at present.  He said he fatigues easily when walking.  He said his physical therapy sessions are now completed.  He said he had received physical therapy sessions in the home as scheduled for several months.  He said he is seeing [name], primary doctor, as scheduled.  He said he has been able to afford his prescribed medications. Client has been referred recently to pain clinic (Dr. Francesco Runner in [place], [place]).  Client said he met with Dr. Francesco Runner to discuss  pain issues of client.  Client said that Dr. Francesco Runner did not change client's medications.  Dr. Francesco Runner recommended a spinal injection (if desired by patient) to help client with pain issues. Client said he decided not to have spinal injection for pain and he had informed Dr. Francesco Runner of his decision not to have spinal injection for pain.  He said he has an appointment scheduled to see Dr. Francesco Runner again in about 3 months.  He said he has prescribed medications. He said Apolonio Schneiders, behavioral health case manager from [employer] is still calling client regularly to discuss mental health needs of client.  CSW and client spoke of [facility] counselors list provided to client from [place] Apolonio Schneiders).  Client said that it  would be hard for him to drive to and from [place], [place] regulary or [place], [place] regularly for counseling. He said it would also be difficult to pay copay amounts due at each counseling session.  Client said he really enjoyed weekly phone call with Apolonio Schneiders regarding mental health needs of client Apolonio Schneiders is [place]).  CSW and client spoke of his being contacted by Mudlogger Angela Nevin) at [employer] of [facility].  Client siad he had not attended social activities at [facility] of [facility].  He said he sometimes gets anxious or nervious around other people.  He said he has a neighbor locally with whom client visits and socializes.  Client said he and his neighbor had gone to local flea market recently.  CSW spoke with client of benefits of socialization for client.  He said he had reduced family contact. He said both of his parents are deceased.  He said a brother of client was also deceased.  Client said he uses 3 in 1 bedside commode to help with using bathroom and that 3 in 1 bedside commode is very helpful.  He said he is doing ok with his meals. He eats sandwichesoccasionally and sometimes goes to [employer] to eat meals. He said he is making it financially.  Client said he is taking his medications as prescribed.  CSW informed client that Dr. Casimiro Needle, psychiatrist, has been consulted regarding medications prescribed for client. Client understood information.  [name] to be contacted by [facility] pharmacist to discuss medication recommendations for client by Dr. Casimiro Needle.  Client said he has a quite living environment.  CSW gave client printed copies of following EMMI handouts:  Panic Disorder, Coping with Health Condition; Fatigue, and Stress Management. Client was appreciative of EMMI handouts received from [place] on 09/05/14.   Plan:  Client to take medications as prescribed and to attend scheduled medical appointments. Client to communicate with [name] about  medical issues of client. Client to communicate with Apolonio Schneiders, behavioral health case manager [facility]) via weekly phone calls from [place]. CSW to contact client in two weeks to assess needs of client.    [name].Jasmane [place] MSW, LCSW Licensed Clinical Social Worker [facility] Care Management [phone]

## 2014-09-06 DIAGNOSIS — I359 Nonrheumatic aortic valve disorder, unspecified: Secondary | ICD-10-CM | POA: Diagnosis not present

## 2014-09-19 ENCOUNTER — Other Ambulatory Visit: Payer: Self-pay | Admitting: Licensed Clinical Social Worker

## 2014-09-19 NOTE — Patient Outreach (Signed)
  Assessment:  CSW called home phone number of client on 09/19/14. CSW was not able to speak via phone with client; however, CSW left phone message for client on 09/19/14 requesting that client call CSW at [phone] to discuss current needs of client.  CSW later spoke via phone with client on 09/19/14. CSW verified identity of client  . Client said he was eating well and had been socializing (eating out at restaurant) with neighbor periodically. He said he enjoyed socializing with neighbor by eating out occasionally. He said he did have difficulty sleeping periodically.  He said he uses a cane to assist in walking.  He said his car is available to use as needed.  He said he has his prescribed medications. He said he sometimes needs information about his medications.  CSW encouraged Tareek to call office of [name], primary doctor for client, to speak with nurse at that practice about medication questions of client.  Client can discuss medication questions with nurse at practice of [name] and  nurse can then communicate further with [name], as needed, to address medication questions of client.  Client said he is receiving weekly calls from [place], [place], to discuss mental health needs of client. He said he benefits from these weekly calls with [place], [place].  CSW thanked Montrey for phone conversation.  CSW encouraged Markon to call CSW at [phone] as needed to address social work issues faced by client.   Plan: Client to take medications as prescribed and to attend scheduled medical appointments. Client to continue communicating via phone, as scheduled weekly, with [place]. CSW to call client in three weeks to assess needs of client at that time.   [name].Jahnavi Muratore MSW, LCSW Licensed Clinical Social Worker [facility] Care Management [phone]

## 2014-10-04 ENCOUNTER — Other Ambulatory Visit: Payer: Self-pay | Admitting: Cardiovascular Disease

## 2014-10-04 DIAGNOSIS — I240 Acute coronary thrombosis not resulting in myocardial infarction: Secondary | ICD-10-CM | POA: Diagnosis not present

## 2014-10-04 DIAGNOSIS — I359 Nonrheumatic aortic valve disorder, unspecified: Secondary | ICD-10-CM | POA: Diagnosis not present

## 2014-10-11 DIAGNOSIS — I359 Nonrheumatic aortic valve disorder, unspecified: Secondary | ICD-10-CM | POA: Diagnosis not present

## 2014-10-12 DIAGNOSIS — R590 Localized enlarged lymph nodes: Secondary | ICD-10-CM | POA: Diagnosis not present

## 2014-10-17 DIAGNOSIS — K210 Gastro-esophageal reflux disease with esophagitis: Secondary | ICD-10-CM | POA: Diagnosis not present

## 2014-10-17 DIAGNOSIS — E782 Mixed hyperlipidemia: Secondary | ICD-10-CM | POA: Diagnosis not present

## 2014-10-17 DIAGNOSIS — N183 Chronic kidney disease, stage 3 (moderate): Secondary | ICD-10-CM | POA: Diagnosis not present

## 2014-10-17 DIAGNOSIS — E039 Hypothyroidism, unspecified: Secondary | ICD-10-CM | POA: Diagnosis not present

## 2014-10-17 DIAGNOSIS — I359 Nonrheumatic aortic valve disorder, unspecified: Secondary | ICD-10-CM | POA: Diagnosis not present

## 2014-10-25 DIAGNOSIS — K219 Gastro-esophageal reflux disease without esophagitis: Secondary | ICD-10-CM | POA: Diagnosis not present

## 2014-10-25 DIAGNOSIS — I719 Aortic aneurysm of unspecified site, without rupture: Secondary | ICD-10-CM | POA: Diagnosis not present

## 2014-10-25 DIAGNOSIS — J449 Chronic obstructive pulmonary disease, unspecified: Secondary | ICD-10-CM | POA: Diagnosis not present

## 2014-10-25 DIAGNOSIS — F331 Major depressive disorder, recurrent, moderate: Secondary | ICD-10-CM | POA: Diagnosis not present

## 2014-10-25 DIAGNOSIS — Z720 Tobacco use: Secondary | ICD-10-CM | POA: Diagnosis not present

## 2014-10-25 DIAGNOSIS — I359 Nonrheumatic aortic valve disorder, unspecified: Secondary | ICD-10-CM | POA: Diagnosis not present

## 2014-10-25 DIAGNOSIS — J301 Allergic rhinitis due to pollen: Secondary | ICD-10-CM | POA: Diagnosis not present

## 2014-10-25 DIAGNOSIS — I2510 Atherosclerotic heart disease of native coronary artery without angina pectoris: Secondary | ICD-10-CM | POA: Diagnosis not present

## 2014-11-07 ENCOUNTER — Other Ambulatory Visit: Payer: Self-pay | Admitting: Licensed Clinical Social Worker

## 2014-11-07 NOTE — Patient Outreach (Signed)
Assessment:  CSW called client on 11/07/14.  CSW verified identity of client. [name] said that he is scheduled to have medical appointment with [name] in a few weeks. He also said he has appointment in a few weeks with Dr. Francesco Runner related to pain management for client. Client said he was not able to keep his car since it had mechanical problems. He and CSW spoke of [facility] ([place]) and fact that [name] has been scheduling transport assist with [employer] as needed. He said he has a neighbor who helps him obtain needed grocery/food items regularly. He said his neighbor and he socialize periodically. He said his neighbor enjoys cooking as a hobby and sometimes will share food with [name].  [name] said he is using cane to help in walking and is doing well with activities of daily living.  CSW and client spoke of [facility] counselor, Apolonio Schneiders, who has called client weekly for counseling support for a number of months. [name] said that Apolonio Schneiders had just recently completed the weekly calls with him. He said Apolonio Schneiders would continue to call him periodically to see how client is doing related to counseling needs of client. [name] said that counseling support received from Apolonio Schneiders, at [facility], had been extremely helpful to him and he enjoyed calls with counselor. He said he has adequate food supply. He said sometimes his sleep cycle is mixed up. He said hehas talked with primary doctor about this issue.  He said he had his prescribed medications. CSW thanked [name] for phone conversation and encouraged [name] to call CSW at [phone] as needed to discuss social work needs of client.  Plan: Client to take medications as prescribed and attend scheduled medical appointments. Client to communicate with [name], as needed, to discuss medical needs of client. CSW to call client in three weeks to assess needs of client at that time.  [name].Sereniti Wan MSW, LCSW Licensed Clinical Social Worker [facility] Care  Management [phone]

## 2014-11-09 ENCOUNTER — Other Ambulatory Visit: Payer: Self-pay | Admitting: Licensed Clinical Social Worker

## 2014-11-09 NOTE — Patient Outreach (Signed)
Assessment: CSW called client on 11/09/14.  CSW verified identity of client.  CSW and Loui spoke of current client status on Meals on Wheels waiting list.  CSW informed Griselda that [place] had called [facility] on 11/09/14 and talked with Manus Gunning at agency about client status on Meals on Wheels waiting list.  Manus Gunning had informed CSW on 11/09/14 that Horacio was listed as number 21 on this waiting list for Meals on Wheels support. Thus, client is not at top of this waiting list. Manus Gunning said it could be a number of months before client came to the top of Meals on Wheels waiting list. CSW informed [place] of above information on 11/09/14.   Jett and CSW spoke of ways client might try to procure needed food items in weeks ahead. Attikus said that he had a neighbor who could help him procure some needed grocery items. CSW encouraged Janssen to talk with his sister, Vaughan Basta, to see if she could periodically bring him some needed food items.Joncarlo was appreciative of information from [place] on 11/09/14.  CSW encouraged Zyan to call CSW at [phone] as needed to discuss social work needs of client.  Plan: Client to communicate with neighbor and family to discuss food procurement options for client for coming weeks. Jacquez to take medications as prescribed and attend scheduled medical appointments. CSW to contact [place] in two weeks to discuss food procurement strategies with Dominica Severin.  [name].Ilya Neely MSW, LCSW Licensed Clinical Social Worker [facility] Care Management [phone]

## 2014-11-09 NOTE — Patient Outreach (Signed)
Assessment:  CSW called Manus Gunning at [facility] on 11/09/14. CSW verified identity of Manus Gunning at [place].  CSW and Manus Gunning spoke of status of client on Meals on Wheels waiting list.  Manus Gunning said that she had corrected spelling of client name on waiting list on 11/09/14. CSW gave Manus Gunning the address and date of birth of client on 11/09/14. CSW informed Manus Gunning that client no longer had a car to drive and was dependent on others to help procure food for him. Manus Gunning said she had undated all client information for Meals [employer] Whe[employer] waiting list.  She said client was not at top of waiting list so it could be a number of months before client came to top of Meals on Wheels waiting list. CSW to inform client of this information and talk with client about his plans to obtain food/grocery items for next few months.  CSW thanked Manus Gunning for her assistance in this matter pertaining to client.  Plan: CSW to call client on 11/09/14 and inform client of above information. Client to take medications as prescribed and attend scheduled medical appointments.  [name].Brae Schaafsma MSW, LCSW Licensed Clinical Social Worker [facility] Care Management [phone]

## 2014-11-11 ENCOUNTER — Other Ambulatory Visit: Payer: Self-pay | Admitting: Licensed Clinical Social Worker

## 2014-11-11 NOTE — Patient Outreach (Signed)
Assessment: CSW received call from client. CSW verified identity of client. CSW and [place] spoke of current food procurement needs of client.  Client said he was having trouble getting help in obtaining food items needed.  CSW and client spoke of fact that Johney was on the Meals on Wheels waiting list but he was not at top of this list and so it is likely that it will be several months before he may be able to receive free Meals on Wheels food support. CSW and Benjamen spoke of fact that client could contact [facility] and speak with representative about possibly buying some lunch meals weekly to help him with his food needs.  CSW gave [place] the [facility] phone number and encouraged Kevonte to call that agency to discuss costs for delivery of Meals on Wheels lunches to his home. Moses said that he may be interested in paying for meal delivery of several lunch meals weekly from [employer] Whe[employer] program to his home. Fabrizio said he is having reduced family support and having difficulty getting his neighbor to help with food purchases for client at present.  Client did say that since he was no longer paying for his car and car insurance, that this may free up some funds for client to use to purchase food items needed. CSW gave Jerek the [facility] CSW number of [phone] and encouraged Levon to call CSW as needed to further discuss food procurement needs of client.  Plan: Client to call [facility] in [place], [place] to discuss with representative of agency the customary costs for Meals on Wheels meal delivery to home of client. Client to take medications as prescribed and attend medical appointments scheduled. CSW to call client next week to assess needs of client.  [name].Dorlisa Savino MSW, LCSW Licensed Clinical Social Worker [facility] Care Management [phone]

## 2014-11-15 DIAGNOSIS — I240 Acute coronary thrombosis not resulting in myocardial infarction: Secondary | ICD-10-CM | POA: Diagnosis not present

## 2014-11-17 ENCOUNTER — Other Ambulatory Visit: Payer: Self-pay | Admitting: Licensed Clinical Social Worker

## 2014-11-17 DIAGNOSIS — F331 Major depressive disorder, recurrent, moderate: Secondary | ICD-10-CM

## 2014-11-17 DIAGNOSIS — E785 Hyperlipidemia, unspecified: Secondary | ICD-10-CM

## 2014-11-17 NOTE — Patient Outreach (Signed)
Assessment.: Client called CSW on 11/17/14. CSW verified identity of client.  CSW and client spoke of needs of client via phone on 11/17/14. Client said he had called representative at [facility] recently and talked with representative about private pay rate for receiving Meals on Wheels Monday through Friday lunch meal deliveries at his home. Client said that monthly cost for such delivery was about $91.00. Client informed representative at [facility] that client would be willing to private pay this $91.00 per month for Meals on Wheels lunch meal deliveries to his home on Monday through Friday of each week.  Representative of [employer] took this information from client and planned to forward information to Freight forwarder for Meals on Wheels program through [employer].  Client is hoping to hear more from [employer] representative soon regarding Meals on Wheels possible deliveries to his home.  Client also said his memory is not doing very well. Client said he is having trouble remembering to take his prescribed medications. He said he did not have a medication box to use.  He said he is hoping a nurse will be able to contact him to discuss medication administration for client.  He said he is having memory issues and is afraid he might be missing some of his prescribed medication doses. CSW informed client that [place] would make [facility] nursing referral for client on 11/17/14.  Client was appreciative.  Client is eating adequately and is trying to advocate for himself in addressing needs faced by client.Client is attending scheduled appointments with primary doctor. Client continues to [place] to help him transport to and from medical appointments scheduled.  Plan: Client to continue to communicate with [employer] representative to discuss beginning of Meals on Wheels services (private pay) for client Client to take medications as prescribed and attend  scheduled medical appointments. CSW to make [facility] nursing referral for client on 11/17/14. CSW to call client in two weeks to assess needs of client.  [name].Lavaughn Haberle MSW, LCSW Licensed Clinical Social Worker [facility] Care Management [phone]

## 2014-11-18 ENCOUNTER — Other Ambulatory Visit: Payer: Self-pay | Admitting: *Deleted

## 2014-11-18 DIAGNOSIS — F331 Major depressive disorder, recurrent, moderate: Secondary | ICD-10-CM

## 2014-11-18 NOTE — Patient Outreach (Signed)
11/18/14- Referral received from [name] for medication management, telephone call to pt, HIPAA verified,  Pt states he gets confused about his medications " because I take so many" pt feels he is on too much medication, states the BID and TID medications are concerning to him "because I think I forget to take it sometimes"  Pt is open to the idea of a med alarm and would be willing to try if appropriate for his situation, pt has medication box but does not prefill as he feels he is not able and pt has previously verbalized he liked his own system and way of doing things, opening each bottle daily to take medications.  Pt reports he has memory issues and suffers with major depression, isolation and is alone most of the time.  Pt reports blood pressure has been good and chronically runs " on the lower side".  Pt reports his most challenging issue now is still depression and being lonely.  RN CM called [facility] pharmacist [name] and reported the above situation, referral also sent to pharmacist.  RN CM sent In Basket note to [place] to inform [facility] pharmacist will be seeing pt for assessment.  [name] [facility], [place] Coordinator [phone]

## 2014-11-18 NOTE — Patient Outreach (Signed)
[place] [facility]) Care Management  11/18/2014  [name] [birth year] [id number]   Request from Theadore Nan, LCSW to assign Community RN, assigned [name], RN.  Thanks, [name]. [place], [place] Assistant Phone: [phone] Fax: [phone]

## 2014-11-18 NOTE — Patient Outreach (Signed)
[place] [facility]) Care Management  11/18/2014  [name] [birth year] [id number]   Request from [name], RN to assign Pharmacy, assigned [name], PharmD.  Thanks, [name]. [place], [place] Assistant Phone: [phone] Fax: [phone]

## 2014-11-21 ENCOUNTER — Other Ambulatory Visit: Payer: Self-pay

## 2014-11-21 NOTE — Patient Outreach (Signed)
I called Mr. Mccalla to set up a home visit to review his medications and assist with adherence.  I will make a home visit on Wednesday, November 23, 2014 at 11 am.    [name], PharmD, [place] [phone]

## 2014-11-22 ENCOUNTER — Other Ambulatory Visit: Payer: Self-pay | Admitting: Licensed Clinical Social Worker

## 2014-11-22 NOTE — Patient Outreach (Signed)
Assessment:  CSW called home phone number of client on 11/22/14.  CSW verified identity of client.  Client said he had talked with RN [name] regarding medication issues. Client said [facility] pharmacist had called him yesterday and that [facility] pharmacist [facility]) plans to conduct home visit with client on 11/23/14.  Client said he has his prescribed medications.  He said he has adequate food supply.  Client said representaive from [employer] Whe[employer] program in [place] is supposed to call [place] today to discuss Meals on Wheels delivery (private pay) for client.   He said he has been sleeping adequately.  He said his family has reduced contact with him.  He still has pain issues. He said he is seeing Dr. Francesco Runner at [facility] in [facility] to help him manage pain issues of client.  He said he has pain medication prescribed.  He said he is scheduled to see Dr. Francesco Runner again on 11/30/14.  He said he has lower back pain, pain in hips and pain in both legs.  He said he has had some numbness in right leg recently.  He said he has had some numbness in his hands and arms.  He said he can sit in chair and his leg may become slightly numb and difficult for him to have feeling in his leg.  When he begins to walk, he said his legs feel better and are more normal related to feeling.  He said he has finger stick as scheduled regularly at office of [name] to check coumadin level of client.  He said he has coumadin checks at office of [name] two times monthly.   He has appointment with cardiolgoist in September of 2016.  He said the pain in his lower back is difficult for him. 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  He said he has fibromyalgia diagnosis.  He said he had undergone multiple surgeries and he is not wanting to undergo surgery again at present if possible.  He said pain has recently been difficult to manage and he thinks he will probably run out of his prescribed pain  medication before he is able to seek refill on prescribed pain medication.  Client said he was looking forward to home visit of [facility]  pharmacist with client on 11/23/14. CSW congratulated [place] on his communicating with [employer] representative to seek meal delivery for client through Meals on Wheels program.   Plan: Client to take medications as prescribed and attend scheduled medical appointments. Client to communicate with Dr. Francesco Runner as needed related to pain management for client. Client to communicate with Dr. Gar Ponto as needed related to general medical needs of client CSW to call client in three weeks to assess needs of client.  [name].Ileene Allie MSW, LCSW Licensed Clinical Social Worker [facility] Care Management [phone].

## 2014-11-23 ENCOUNTER — Other Ambulatory Visit: Payer: Self-pay | Admitting: Pharmacist

## 2014-11-23 ENCOUNTER — Ambulatory Visit: Payer: Commercial Managed Care - HMO

## 2014-11-23 ENCOUNTER — Other Ambulatory Visit: Payer: Self-pay | Admitting: Licensed Clinical Social Worker

## 2014-11-23 NOTE — Patient Outreach (Signed)
Assessment: Client called CSW on 11/23/14. CSW verified identity of client. Jori and CSW spoke of current issues facing client. Client spoke of ongoing pain issues and that he had been taking pain medication prescribed.  He said however that he had been taking prescribed pain medication at twice dosage amount since he was in such pain.  He asked for phone contact from [facility] pharmacist to discuss medication issues.  CSW called Elisabeth Most, [facility] clinical pharmacist, on 11/23/14 and informed Apolonio Schneiders of current needs of client.  CSW informed Apolonio Schneiders that client was in severe pain and informed pharmacist that client was taking pain medication prescribed in twice the dosage amount prescribed due to severe pain. Client said he would likely run out of his dose of pain medication before he is able to reorder prescribed pain medication.  Apolonio Schneiders informed CSW that Nicoletta Ba, pharmacist, had called client on 11/23/14 and scheduled for [facility] pharmacist to conduct home visit with client on 11/25/14 at 3:00 PM.  Apolonio Schneiders stressed that client should only take pain medication as prescribed and could have side affects from taking pain medication in different dosage amount than was prescribed.  CSW then called client back on 11/23/14 and informed client that [facility] pharmacist was scheduled to conduct home visit with client on 11/25/14 at 3:00 PM.  CSW informed [place] that Apolonio Schneiders, clinical pharmacist, requested that client not take pain medication in different dosage amount than was prescribed for client.  CSW informed client, that per Apolonio Schneiders, pharmacist, client could have side affects from taking pain medication in different dosage amount than was prescribed for client.  CSW encouraged client to take medications as prescribed.  Client is aware that he may not be able to seek pain prescription refill until 30 day period (from last refill) is up.  Client said he thought he may not be able to get refill on prescribed pain medication until 12/05/14.      Plan: Client to take medications as prescribed. Client to meet with [facility] pharmacist at home of client for  scheduled pharmacist visit on 11/25/14 at 3:00 PM. CSW to call client next week to assess needs of client.  [name].Kyzen Horn MSW, LCSW Licensed Clinical Social Worker [facility] Care Management [phone]

## 2014-11-23 NOTE — Patient Outreach (Signed)
[place] [facility]) Care Management  [place]   11/23/2014  [name] Aug 01, 1950 [id number]   [name] is a 64 y.o. male who had a home visit scheduled with [name], PharmD today but she had a family emergency and this visit needed to be rescheduled.  I called the patient and the visit was rescheduled with me for Friday, August 26th at [place], PharmD, [place] [phone]

## 2014-11-25 ENCOUNTER — Other Ambulatory Visit: Payer: Self-pay | Admitting: Pharmacist

## 2014-11-25 ENCOUNTER — Ambulatory Visit: Payer: Commercial Managed Care - HMO | Admitting: Pharmacist

## 2014-11-25 NOTE — Patient Outreach (Signed)
[facility]) Care Management  [place]   11/25/2014  BRANTLEY WILEY 1951-02-12 [id number]  Subjective:  [name] is 64 y.o. male who was referred to [facility] for medication management and medication adherence.   Patient reports s  Medication Adherence Questionnaire (A score of 2 or more points indicates risk for nonadherence)  Do you know what each of your medicines is for?  1  Do you ever have trouble remembering to take your medicine?  2  Do you ever not take a medicine because you feel you do not need it?   0  Do you think that any of your medicines is not helping you?  0  Do you have any physical problems such as vision loss that keep you from taking your medicines as prescribed?   0  Do you think any of your medicine is causing a side effect?  0  Do you know the names of ALL of your medicines?  1  Do you think that you need ALL of your medicines?  0  In the past 6 months, have you missed getting a refill or a new prescription filled on time?  1  How often do you miss taking a dose of medicine?   2   TOTAL SCORE  7    Patient has known adherence challenges. Barriers include: lack of knowledge and forgetfulness.  Patient's friend, Edd Arbour, was present for the visit. They are close friends who grew up together and Edd Arbour recently moved into the patient's complex. Edd Arbour assists the patient with transportation and medication management.    Objective:   Current Medications: Current Outpatient Prescriptions  Medication Sig Dispense Refill  . ALPRAZolam (XANAX) 1 MG tablet Take 1 mg by mouth 5 (five) times daily as needed for anxiety.     [name] atorvastatin (LIPITOR) 40 MG tablet Take 40 mg by mouth daily.     [name] FLUoxetine (PROZAC) 20 MG capsule Take 1 capsule by mouth daily.    [name] gabapentin (NEURONTIN) 100 MG capsule Take 100 mg by mouth 3 (three) times daily.    [name] levothyroxine (SYNTHROID, LEVOTHROID) 50 MCG tablet Take 50 mcg by mouth daily.     [name] lisinopril-hydrochlorothiazide (PRINZIDE) 10-12.5 MG per tablet Take 1 tablet by mouth daily. 90 tablet 1  . loratadine (CLARITIN) 10 MG tablet Take 10 mg by mouth daily.    . metoprolol tartrate (LOPRESSOR) 25 MG tablet TAKE ONE TABLET BY MOUTH THREE TIMES DAILY 90 tablet 3  . NITROSTAT 0.4 MG SL tablet Place 0.4 mg under the tongue every 5 (five) minutes as needed for chest pain.     [name] oxyCODONE-acetaminophen (PERCOCET/ROXICET) 5-325 MG per tablet Take 1 tablet by mouth 4 (four) times daily as needed for severe pain.    . pantoprazole (PROTONIX) 40 MG tablet Take 40 mg by mouth daily.      . potassium chloride (K-DUR) 10 MEQ tablet Take 1 tablet (10 mEq total) by mouth daily. 90 tablet 3  . promethazine (PHENERGAN) 25 MG tablet Take 25 mg by mouth every 6 (six) hours as needed. For nausea    . warfarin (COUMADIN) 2 MG tablet Take 1-1.5 tablets (2-3 mg total) by mouth daily. Patient takes 1 tablet(2mg ) on Saturday and Wednesday and 1&1/2(3mg ) all other days 20 tablet 0   No current facility-administered medications for this visit.    Functional Status: In your present state of health, do you have any difficulty performing the following  activities: 07/05/2014  Hearing? N  Vision? N  Difficulty concentrating or making decisions? Y  Walking or climbing stairs? Y  Dressing or bathing? Y  Doing errands, shopping? Y  Preparing Food and eating ? Y  Using the Toilet? Y  In the past six months, have you accidently leaked urine? N  Do you have problems with loss of bowel control? N  Managing your Medications? N  Managing your Finances? Y  Housekeeping or managing your Housekeeping? Y    Fall/Depression Screening: PHQ 2/9 Scores 07/05/2014  PHQ - 2 Score 2  PHQ- 9 Score 21    Assessment: 1. Medication adherence: patient is not completed adherent to medications due to forgetfulness. Patient is open to education on pill box and med alarm use. Patient's friend, Edd Arbour, appears to be very  knowledgeable about medication management and is willing to help patient with pill box fills. 2. Pain: patient reports severe pain that is making him take more than the prescribe amount of his oxycodone. I do think he is truly in pain and that he is not abusing the oxycodone. However, he will run out of the medication and not have anything for pain if the prescription is not changed by the pain clinic.   Plan: 1. Medication adherence: provided patient with pill box and Med Alarm. Set up the Med Alarm for 8am, 2pm, and 8pm. Patient educated on the use of the pill box and alarm. Pill box was filled x 2 weeks with the medical supply that patient has available. Patient educated on the purpose of each of his medications and was given a medication list with the indication and a picture of the indication provided on the list. Ronnie educated on how to fill pill box. I will follow up with patient in one week (11/30/14) to see if the pill box and alarm have improved his adherence to his medications. 2. Pain: will send fax to pain clinic ([facility] in [place], [place]) alerting provider that patient is using more oxycodone than is prescribed. Educated patient on the risks of using too much oxycodone, including sedation and falls.   Patient was seen with Bennye Alm, PharmD Resident.  Nicoletta Ba, PharmD, [facility] [phone]

## 2014-11-30 ENCOUNTER — Other Ambulatory Visit: Payer: Self-pay | Admitting: Pharmacist

## 2014-11-30 DIAGNOSIS — M9983 Other biomechanical lesions of lumbar region: Secondary | ICD-10-CM | POA: Diagnosis not present

## 2014-11-30 DIAGNOSIS — M5136 Other intervertebral disc degeneration, lumbar region: Secondary | ICD-10-CM | POA: Diagnosis not present

## 2014-11-30 DIAGNOSIS — M545 Low back pain: Secondary | ICD-10-CM | POA: Diagnosis not present

## 2014-11-30 DIAGNOSIS — M47816 Spondylosis without myelopathy or radiculopathy, lumbar region: Secondary | ICD-10-CM | POA: Diagnosis not present

## 2014-11-30 NOTE — Patient Outreach (Signed)
[facility]) Care Management  [place]   11/30/2014  [name] 1950-05-28 [id number]  Subjective:  [name] is 64 y.o. male who was referred to [place] for medication management and medication adherence.   I called patient today to follow up on adherence and to see if he has any other pharmacy concerns.  Patient denies missing any doses since receiving the pill box and the MedAlarm. He reports that the alarm has been very helpful in reminding him to take his medications. He denies any issues with the alarm and reports that it continues to go off three times a day.  Patient reports that he went to the pain clinic this morning. The physician did not increase his oxycodone-acetaminophen and told him to wait. He was disappointed in not getting a dose increase for his pain.     Objective:   Current Medications: Current Outpatient Prescriptions  Medication Sig Dispense Refill  . ALPRAZolam (XANAX) 1 MG tablet Take 1 mg by mouth 5 (five) times daily as needed for anxiety.     [name] atorvastatin (LIPITOR) 40 MG tablet Take 40 mg by mouth daily.     [name] FLUoxetine (PROZAC) 20 MG capsule Take 1 capsule by mouth daily.    [name] gabapentin (NEURONTIN) 400 MG capsule Take 400 mg by mouth 3 (three) times daily.    [name] levothyroxine (SYNTHROID, LEVOTHROID) 50 MCG tablet Take 50 mcg by mouth daily.    [name] lisinopril-hydrochlorothiazide (PRINZIDE) 10-12.5 MG per tablet Take 1 tablet by mouth daily. 90 tablet 1  . loratadine (CLARITIN) 10 MG tablet Take 10 mg by mouth daily.    . metoprolol tartrate (LOPRESSOR) 25 MG tablet TAKE ONE TABLET BY MOUTH THREE TIMES DAILY 90 tablet 3  . NITROSTAT 0.4 MG SL tablet Place 0.4 mg under the tongue every 5 (five) minutes as needed for chest pain.     [name] oxyCODONE-acetaminophen (PERCOCET/ROXICET) 5-325 MG per tablet Take 1 tablet by mouth 4 (four) times daily as needed for severe pain.    . pantoprazole (PROTONIX) 40 MG tablet Take 40 mg  by mouth daily.      . potassium chloride (K-DUR) 10 MEQ tablet Take 1 tablet (10 mEq total) by mouth daily. 90 tablet 3  . promethazine (PHENERGAN) 25 MG tablet Take 25 mg by mouth every 6 (six) hours as needed. For nausea    . warfarin (COUMADIN) 2 MG tablet Take 1-1.5 tablets (2-3 mg total) by mouth daily. Patient takes 1 tablet(2mg ) on Saturday and Wednesday and 1&1/2(3mg ) all other days 20 tablet 0   No current facility-administered medications for this visit.    Functional Status: In your present state of health, do you have any difficulty performing the following activities: 07/05/2014  Hearing? N  Vision? N  Difficulty concentrating or making decisions? Y  Walking or climbing stairs? Y  Dressing or bathing? Y  Doing errands, shopping? Y  Preparing Food and eating ? Y  Using the Toilet? Y  In the past six months, have you accidently leaked urine? N  Do you have problems with loss of bowel control? N  Managing your Medications? N  Managing your Finances? Y  Housekeeping or managing your Housekeeping? Y    Fall/Depression Screening: PHQ 2/9 Scores 07/05/2014  PHQ - 2 Score 2  PHQ- 9 Score 21    Assessment: 1. Medication adherence: Patient adherent with medications as a result of having the alarm. 2. Pain: patient reports pain and  did not get a dose increase in his opioid from the pain clinic today.   Plan: 1. Medication adherence: patient to continue to use pill box and alarm. His friend Edd Arbour will help him to fill pill box. No further pharmacy interventions needed at this time. Patient has my number and [name], PharmD, number in case any pharmacy issues arise. Will close out of pharmacy program due to patient meeting goals.  2. Pain: encouraged patient to follow the pain clinic's instructions on how to take his pain medication and to continue to work with the clinic.     Nicoletta Ba, PharmD, [facility] [phone]

## 2014-12-14 DIAGNOSIS — I240 Acute coronary thrombosis not resulting in myocardial infarction: Secondary | ICD-10-CM | POA: Diagnosis not present

## 2014-12-14 DIAGNOSIS — I2510 Atherosclerotic heart disease of native coronary artery without angina pectoris: Secondary | ICD-10-CM | POA: Diagnosis not present

## 2014-12-30 ENCOUNTER — Encounter: Payer: Commercial Managed Care - HMO | Admitting: Cardiology

## 2014-12-30 ENCOUNTER — Encounter: Payer: Self-pay | Admitting: Cardiology

## 2014-12-30 NOTE — Progress Notes (Signed)
No show  This encounter was created in error - please disregard.

## 2015-01-03 ENCOUNTER — Ambulatory Visit: Payer: Commercial Managed Care - HMO | Admitting: Cardiology

## 2015-01-11 DIAGNOSIS — I359 Nonrheumatic aortic valve disorder, unspecified: Secondary | ICD-10-CM | POA: Diagnosis not present

## 2015-01-12 ENCOUNTER — Other Ambulatory Visit: Payer: Self-pay | Admitting: Cardiovascular Disease

## 2015-01-16 DIAGNOSIS — I359 Nonrheumatic aortic valve disorder, unspecified: Secondary | ICD-10-CM | POA: Diagnosis not present

## 2015-01-20 DIAGNOSIS — I359 Nonrheumatic aortic valve disorder, unspecified: Secondary | ICD-10-CM | POA: Diagnosis not present

## 2015-01-23 ENCOUNTER — Encounter: Payer: Self-pay | Admitting: Licensed Clinical Social Worker

## 2015-01-23 ENCOUNTER — Other Ambulatory Visit: Payer: Self-pay | Admitting: Licensed Clinical Social Worker

## 2015-01-23 DIAGNOSIS — J449 Chronic obstructive pulmonary disease, unspecified: Secondary | ICD-10-CM | POA: Diagnosis not present

## 2015-01-23 DIAGNOSIS — K210 Gastro-esophageal reflux disease with esophagitis: Secondary | ICD-10-CM | POA: Diagnosis not present

## 2015-01-23 DIAGNOSIS — N183 Chronic kidney disease, stage 3 (moderate): Secondary | ICD-10-CM | POA: Diagnosis not present

## 2015-01-23 DIAGNOSIS — I359 Nonrheumatic aortic valve disorder, unspecified: Secondary | ICD-10-CM | POA: Diagnosis not present

## 2015-01-23 DIAGNOSIS — E039 Hypothyroidism, unspecified: Secondary | ICD-10-CM | POA: Diagnosis not present

## 2015-01-23 DIAGNOSIS — E782 Mixed hyperlipidemia: Secondary | ICD-10-CM | POA: Diagnosis not present

## 2015-01-23 DIAGNOSIS — E876 Hypokalemia: Secondary | ICD-10-CM | POA: Diagnosis not present

## 2015-01-23 DIAGNOSIS — I719 Aortic aneurysm of unspecified site, without rupture: Secondary | ICD-10-CM | POA: Diagnosis not present

## 2015-01-23 NOTE — Patient Outreach (Signed)
Assessment: CSW called home phone number of client on 01/23/15 and spoke via phone with [name].  CSW verified identity of client. CSW and client spoke of current needs of client. Client is going to Pain Clinic (Dr. Francesco Runner) as scheduled.  He said he goes to see Dr. Francesco Runner at [facility] about once every two months.  He said he has prescribed medications and is trying to take medications as prescribed.  He said he goes to see [name] for a medical appointment next week.  He said he had talked with representative of [employer] program in [place] and had received a few lunch meals with that program. He then decided not to continue [employer] private pay program since he did not feel he could pay for daily lunches.  He said he is still on wating list for [employer] free program and hopes that in a few months he will qualify for lunch meal delivery through free meal program with [employer].  Client said he has purchased another car to help him go to and from medical appointments.  He said car is reliable for transportation for him.  He said he uses pill box and alarm to help him take his medications on schedule.  He said the alarm is very helpful in reminding him to take medications on schedule.  He uses a cane to help him walk.  He said he can bath well and take baths well.  Client said he is looking into getting a grab bar installed in his bathroom to help him with bathing.  He said he will have appointment on 01/24/15 with his cardiologist Dr. Domenic Polite.  CSW informed client that client had met his care plan goals at this time. CSW informed client that since client had met his care plan goals that CSW would discharge client, close case of client to [place] services on 01/23/15.  Client agreed for CSW to discharge client from [place] services on 01/23/15.  Client has also completed all other [facility] program services. So client is now discharged from all [facility] program services.  CSW thanked  client for client's working with [facility] program staff. CSW congratulated Barrett on his working well with [facility]. CSW congratulated Tiago on reaching his care plan goals through [facility] program. Chace was very appreciative of CSW support in the program. Yichen was also appreciative of support with nursing and pharmacy disciplines through the [facility] program.  Plan: CSW is discharging client from [facility] on 01/23/15 since client has met his care plan goals. CSW to inform Lurline Del on 12/2414 that [facility] discharged client from [place] services on 01/23/15. CSW to send physician case closure letter to Dr. Gar Ponto on 01/23/15 informing [name] that [facility] discharged client from [facility] on 01/23/15 since client had met care plan goals of client.  [name].Shann Lewellyn MSW, LCSW Licensed Clinical Social Worker [facility] Care Management [phone]

## 2015-01-24 ENCOUNTER — Encounter: Payer: Self-pay | Admitting: Cardiology

## 2015-01-24 ENCOUNTER — Ambulatory Visit (INDEPENDENT_AMBULATORY_CARE_PROVIDER_SITE_OTHER): Payer: Commercial Managed Care - HMO | Admitting: Cardiology

## 2015-01-24 ENCOUNTER — Ambulatory Visit: Payer: Commercial Managed Care - HMO | Admitting: Cardiology

## 2015-01-24 VITALS — BP 117/74 | HR 57 | Ht 72.0 in | Wt 220.0 lb

## 2015-01-24 DIAGNOSIS — Z954 Presence of other heart-valve replacement: Secondary | ICD-10-CM | POA: Diagnosis not present

## 2015-01-24 DIAGNOSIS — Z23 Encounter for immunization: Secondary | ICD-10-CM

## 2015-01-24 DIAGNOSIS — I10 Essential (primary) hypertension: Secondary | ICD-10-CM | POA: Diagnosis not present

## 2015-01-24 DIAGNOSIS — Z952 Presence of prosthetic heart valve: Secondary | ICD-10-CM

## 2015-01-24 DIAGNOSIS — I350 Nonrheumatic aortic (valve) stenosis: Secondary | ICD-10-CM

## 2015-01-24 DIAGNOSIS — I2510 Atherosclerotic heart disease of native coronary artery without angina pectoris: Secondary | ICD-10-CM

## 2015-01-24 NOTE — Patient Instructions (Signed)

## 2015-01-24 NOTE — Progress Notes (Signed)
Cardiology Office Note  Date: 01/24/2015   ID: [name], [name] [birth year], MRN [id number]  PCP: Gar Ponto, MD  Primary Cardiologist: [name], MD   Chief Complaint  Patient presents with  . Status post AVR  . Atrial Fibrillation    History of Present Illness: [name] is a medically complex 64 y.o. male last seen in April. He missed his most recent visit with me, was rescheduled to today. He continues to follow with [name]. Since I last saw him, he states that he was changed from Prozac to Effexor XR per [name]. Still seems to be depressed based on discussion today, does not get out of his house much. He reports chronic fatigue and shortness of breath, no chest pain. Also intermittent sense of palpitations.   He has had leg edema that has been intermittent and chronic overall. Currently not on a diuretic. Echocardiogram from 2014 is outlined below. We discussed obtaining a follow-up study to reassess LVEF and status of mechanical AVR.  ECG today shows  Normal sinus rhythm with right bundle branch block which is old.  He continues on Coumadin under the direction of [name]. Reports no spontaneous bleeding episodes.   Past Medical History  Diagnosis Date  . Hyperlipidemia   . Aortic stenosis     Status post AVR 5/13  . Coronary atherosclerosis of native coronary artery     Nonobstructive  . Dyslipidemia   . Fibromyalgia   . Peripheral vascular disease ([place])   . Tinnitus     Following prior seizures  . Seizures ([place])     6 yrs ago from withdrawal of Xanax to quickly  . Long-term memory loss   . Stroke ([name])   . Arthritis   . Chronic back pain     Lumbar spondylosis  . GERD (gastroesophageal reflux disease)   . Hx of colonic polyps   . Nocturia   . Major depressive disorder ([name])     Prior suicidal attempts  . Anxiety   . Thoracic aortic aneurysm [facility])     Status post repair 7/13  . Atrial fibrillation [facility])     Past Surgical History    Procedure Laterality Date  . Inguinal exploration      Right  . Femoral hernia repair      x2  . Umbilical hernia repair      [facility]  . Finger contracture release      Tendon release operation on his right little finger  . Appendectomy    . Cholecystectomy      [facility]  . Incisional hernia repair  05/01/2011    Procedure: HERNIA REPAIR INCISIONAL;  Surgeon: [name], MD;  Location: [facility];  Service: General;  Laterality: N/A;  with Mesh  . Tonsillectomy      at age 70  . Circumcision    . Cardiac catheterization    . Colonoscopy    . Esophagogastroduodenoscopy  04/07/08/13  . Aortic valve replacement  08/15/2011    Procedure: AORTIC VALVE REPLACEMENT (AVR);  Surgeon: [name], MD;  Location: [facility];  Service: Open Heart Surgery;  Laterality: N/A;  . Sternotomy  10/07/2011    Procedure: STERNOTOMY;  Surgeon: [name], MD;  Location: [facility];  Service: Open Heart Surgery;  Laterality: N/A;  . Thoracic aortic aneurysm repair  10/07/2011    Procedure: THORACIC ASCENDING ANEURYSM REPAIR (AAA);  Surgeon: [name], MD;  Location: [facility];  Service:  Open Heart Surgery;  Laterality: N/A;  . Mediastinal exploration  10/08/2011    Procedure: MEDIASTINAL EXPLORATION;  Surgeon: [name], MD;  Location: [facility];  Service: Open Heart Surgery;  Laterality: N/A;    Current Outpatient Prescriptions  Medication Sig Dispense Refill  . ALPRAZolam (XANAX) 1 MG tablet Take 1 mg by mouth 5 (five) times daily as needed for anxiety.     [name] atorvastatin (LIPITOR) 40 MG tablet Take 40 mg by mouth daily.     [name] gabapentin (NEURONTIN) 400 MG capsule Take 400 mg by mouth 3 (three) times daily.    [name] levothyroxine (SYNTHROID, LEVOTHROID) 50 MCG tablet Take 50 mcg by mouth daily.    [name] lisinopril-hydrochlorothiazide (PRINZIDE) 10-12.5 MG per tablet Take 1 tablet by mouth daily. 90 tablet 1  . loratadine (CLARITIN) 10 MG tablet Take 10 mg by mouth daily.    . metoprolol tartrate  (LOPRESSOR) 25 MG tablet TAKE ONE TABLET BY MOUTH THREE TIMES DAILY 90 tablet 1  . NITROSTAT 0.4 MG SL tablet Place 0.4 mg under the tongue every 5 (five) minutes as needed for chest pain.     [name] oxyCODONE-acetaminophen (PERCOCET/ROXICET) 5-325 MG per tablet Take 1 tablet by mouth 4 (four) times daily as needed for severe pain.    . pantoprazole (PROTONIX) 40 MG tablet Take 40 mg by mouth daily.      . potassium chloride (K-DUR) 10 MEQ tablet Take 1 tablet (10 mEq total) by mouth daily. 90 tablet 3  . promethazine (PHENERGAN) 25 MG tablet Take 25 mg by mouth every 6 (six) hours as needed. For nausea    . warfarin (COUMADIN) 2 MG tablet Take 1-1.5 tablets (2-3 mg total) by mouth daily. Patient takes 1 tablet(2mg ) on Saturday and Wednesday and 1&1/2(3mg ) all other days 20 tablet 0  . venlafaxine XR (EFFEXOR-XR) 150 MG 24 hr capsule Take 2 capsules by mouth daily.  1   No current facility-administered medications for this visit.    Allergies:  Bupropion   Social History: The patient  reports that he has been smoking Cigarettes.  He started smoking about 52 years ago. He has a 24 pack-year smoking history. He has never used smokeless tobacco. He reports that he does not drink alcohol or use illicit drugs.   ROS:  Please see the history of present illness. Otherwise, complete review of systems is positive for anhedonia, limited ambulation.  All other systems are reviewed and negative.   Physical Exam: VS:  BP 117/74 mmHg  Pulse 57  Ht 6' (1.829 m)  Wt 220 lb (99.791 kg)  BMI 29.83 kg/m2  SpO2 98%, BMI Body mass index is 29.83 kg/(m^2).  Wt Readings from Last 3 Encounters:  01/24/15 220 lb (99.791 kg)  07/05/14 225 lb 12.8 oz (102.422 kg)  12/01/13 227 lb (102.967 kg)     Chronically ill-appearing, no distress. HEENT: Conjunctiva and lids normal, oropharynx clear.  Neck: Supple, no elevated JVP or carotid bruits, no thyromegaly.  Lungs: Clear to auscultation, nonlabored breathing at  rest.  Cardiac: Regular rate and rhythm, loud crisp prosthetic valve click in S2, no S3, soft systolic murmur, no pericardial rub.  Abdomen: Soft, protuberant, nontender, bowel sounds present. Skin: Warm and dry.  Extremities: Trace ankle edema and venous stasis, spider veins, distal pulses 2+.    ECG: ECG is ordered today.   Recent Labwork:  February 2016: Cholesterol 214, triglycerides 263, HDL 23, LDL 138  Other Studies Reviewed Today:  1. CT angiogram  of the chest from February 2015 showed stable tube graft repair of the ascending aorta with AVR, no description of apparent complications.   2. Echocardiogram in March 2014 demonstrated mild LVH with LVEF 50-55%, septal motion consistent with postoperative state, mid to basal inferoseptal hypokinesis, well-seated mechanical aortic prosthesis with mean gradient 7 mm mercury, no significant mitral regurgitation, trace tricuspid regurgitation with RVSP 25-30 mm mercury, no pericardial effusion, mildly dilated ascending aorta.  Assessment and Plan:  1. Status post mechanical AVR due to aortic stenosis. He continues on Coumadin under the direction of [name]. Last echocardiogram was in 2014. Follow-up study will be arranged, particularly with complaints of leg edema.  May need to consider low-dose diuretic.  2. Intermittent reported palpitations, ECG shows normal sinus rhythm with old right bundle-branch block today. He does have a history of atrial fibrillation that has been paroxysmal. He is concurrently on Coumadin with mechanical AVR. Continue Lopressor.  3. Depression, on Effexor at this point based on discussion today. Keep follow-up with [name].  4. History of nonobstructive CAD, no active angina symptoms.  5. Emergent thoracic aortic aneurysm repair by Dr. Roxan Hockey in 2013.  Current medicines were reviewed with the patient today.   Orders Placed This Encounter  Procedures  . Flu Vaccine QUAD 36+ mos IM  . EKG  12-Lead  . ECHOCARDIOGRAM COMPLETE    Disposition: FU with me in 6 months.   Signed, Satira Sark, MD, [facility] 01/24/2015 3:36 PM    [facility] [place], [place], [place] [address] Phone: [phone]; Fax: [phone]

## 2015-01-27 DIAGNOSIS — I359 Nonrheumatic aortic valve disorder, unspecified: Secondary | ICD-10-CM | POA: Diagnosis not present

## 2015-01-27 NOTE — Patient Outreach (Signed)
[facility]) Care Management  01/27/2015  [name] Dec 18, 1950 [id number]   Notification from Theadore Nan, LCSW to close case due to goals met with [place] Management.  Thanks, [name]. [place], [place] Assistant Phone: [phone] Fax: [phone]

## 2015-01-30 DIAGNOSIS — I359 Nonrheumatic aortic valve disorder, unspecified: Secondary | ICD-10-CM | POA: Diagnosis not present

## 2015-01-30 DIAGNOSIS — F17210 Nicotine dependence, cigarettes, uncomplicated: Secondary | ICD-10-CM | POA: Diagnosis not present

## 2015-01-30 DIAGNOSIS — Z1389 Encounter for screening for other disorder: Secondary | ICD-10-CM | POA: Diagnosis not present

## 2015-01-30 DIAGNOSIS — F331 Major depressive disorder, recurrent, moderate: Secondary | ICD-10-CM | POA: Diagnosis not present

## 2015-01-30 DIAGNOSIS — E039 Hypothyroidism, unspecified: Secondary | ICD-10-CM | POA: Diagnosis not present

## 2015-01-30 DIAGNOSIS — E782 Mixed hyperlipidemia: Secondary | ICD-10-CM | POA: Diagnosis not present

## 2015-01-30 DIAGNOSIS — I719 Aortic aneurysm of unspecified site, without rupture: Secondary | ICD-10-CM | POA: Diagnosis not present

## 2015-01-30 DIAGNOSIS — Z0001 Encounter for general adult medical examination with abnormal findings: Secondary | ICD-10-CM | POA: Diagnosis not present

## 2015-02-03 DIAGNOSIS — I359 Nonrheumatic aortic valve disorder, unspecified: Secondary | ICD-10-CM | POA: Diagnosis not present

## 2015-02-03 DIAGNOSIS — I240 Acute coronary thrombosis not resulting in myocardial infarction: Secondary | ICD-10-CM | POA: Diagnosis not present

## 2015-02-06 DIAGNOSIS — I359 Nonrheumatic aortic valve disorder, unspecified: Secondary | ICD-10-CM | POA: Diagnosis not present

## 2015-02-06 DIAGNOSIS — I240 Acute coronary thrombosis not resulting in myocardial infarction: Secondary | ICD-10-CM | POA: Diagnosis not present

## 2015-02-08 DIAGNOSIS — I359 Nonrheumatic aortic valve disorder, unspecified: Secondary | ICD-10-CM | POA: Diagnosis not present

## 2015-02-13 DIAGNOSIS — I359 Nonrheumatic aortic valve disorder, unspecified: Secondary | ICD-10-CM | POA: Diagnosis not present

## 2015-02-15 ENCOUNTER — Other Ambulatory Visit: Payer: Self-pay

## 2015-02-15 ENCOUNTER — Ambulatory Visit (INDEPENDENT_AMBULATORY_CARE_PROVIDER_SITE_OTHER): Payer: Commercial Managed Care - HMO

## 2015-02-15 DIAGNOSIS — Z954 Presence of other heart-valve replacement: Secondary | ICD-10-CM

## 2015-02-15 DIAGNOSIS — I350 Nonrheumatic aortic (valve) stenosis: Secondary | ICD-10-CM | POA: Diagnosis not present

## 2015-02-15 DIAGNOSIS — Z952 Presence of prosthetic heart valve: Secondary | ICD-10-CM

## 2015-02-16 ENCOUNTER — Telehealth: Payer: Self-pay | Admitting: Cardiology

## 2015-02-16 ENCOUNTER — Telehealth: Payer: Self-pay | Admitting: *Deleted

## 2015-02-16 NOTE — Telephone Encounter (Signed)
Patient informed. 

## 2015-02-16 NOTE — Telephone Encounter (Signed)
See previous note for details of results.

## 2015-02-16 NOTE — Telephone Encounter (Signed)
-----   Message from Satira Sark, MD sent at 02/15/2015  4:41 PM EST ----- Reviewed report. LVEF remains normal range of 60-65% with mild diastolic dysfunction. Prosthetic aortic valve appears to be functioning normally with normal gradients, only trivial aortic regurgitation. These findings are reassuring overall and would not suggest that we need to make major adjustments in his medications at this time.

## 2015-02-16 NOTE — Telephone Encounter (Signed)
[name] called stating that when he was called earlier today he was still sleeping and He has several questions about his echo testing.

## 2015-02-17 NOTE — Telephone Encounter (Signed)
Went over echo results with pt who verbalized understanding. Says he has been SOB and felt "heart beating out of his chest then can hardly feel beating at all" and heaviness in his chest. Pt says he has take 2 nitroglycerins in the last week, none today. Told pt with symptoms, should report to [facility] ED for further evaluation. Pt agreeable and appreciative of call back. Will forward to Dr. Domenic Polite as [name]

## 2015-02-17 NOTE — Telephone Encounter (Signed)
Patient called asking for someone to return his call from yesterday

## 2015-02-20 DIAGNOSIS — I359 Nonrheumatic aortic valve disorder, unspecified: Secondary | ICD-10-CM | POA: Diagnosis not present

## 2015-03-01 DIAGNOSIS — S70261A Insect bite (nonvenomous), right hip, initial encounter: Secondary | ICD-10-CM | POA: Diagnosis not present

## 2015-03-01 DIAGNOSIS — R600 Localized edema: Secondary | ICD-10-CM | POA: Diagnosis not present

## 2015-03-06 DIAGNOSIS — L299 Pruritus, unspecified: Secondary | ICD-10-CM | POA: Diagnosis not present

## 2015-03-06 DIAGNOSIS — I359 Nonrheumatic aortic valve disorder, unspecified: Secondary | ICD-10-CM | POA: Diagnosis not present

## 2015-03-08 DIAGNOSIS — M5136 Other intervertebral disc degeneration, lumbar region: Secondary | ICD-10-CM | POA: Diagnosis not present

## 2015-03-08 DIAGNOSIS — M797 Fibromyalgia: Secondary | ICD-10-CM | POA: Diagnosis not present

## 2015-03-08 DIAGNOSIS — M9983 Other biomechanical lesions of lumbar region: Secondary | ICD-10-CM | POA: Diagnosis not present

## 2015-03-08 DIAGNOSIS — M47816 Spondylosis without myelopathy or radiculopathy, lumbar region: Secondary | ICD-10-CM | POA: Diagnosis not present

## 2015-03-13 DIAGNOSIS — I359 Nonrheumatic aortic valve disorder, unspecified: Secondary | ICD-10-CM | POA: Diagnosis not present

## 2015-03-16 DIAGNOSIS — E039 Hypothyroidism, unspecified: Secondary | ICD-10-CM | POA: Diagnosis not present

## 2015-03-16 DIAGNOSIS — E782 Mixed hyperlipidemia: Secondary | ICD-10-CM | POA: Diagnosis not present

## 2015-03-16 DIAGNOSIS — F331 Major depressive disorder, recurrent, moderate: Secondary | ICD-10-CM | POA: Diagnosis not present

## 2015-03-16 DIAGNOSIS — F17210 Nicotine dependence, cigarettes, uncomplicated: Secondary | ICD-10-CM | POA: Diagnosis not present

## 2015-03-16 DIAGNOSIS — K219 Gastro-esophageal reflux disease without esophagitis: Secondary | ICD-10-CM | POA: Diagnosis not present

## 2015-03-16 DIAGNOSIS — N183 Chronic kidney disease, stage 3 (moderate): Secondary | ICD-10-CM | POA: Diagnosis not present

## 2015-03-16 DIAGNOSIS — I25111 Atherosclerotic heart disease of native coronary artery with angina pectoris with documented spasm: Secondary | ICD-10-CM | POA: Diagnosis not present

## 2015-03-16 DIAGNOSIS — J449 Chronic obstructive pulmonary disease, unspecified: Secondary | ICD-10-CM | POA: Diagnosis not present

## 2015-03-27 DIAGNOSIS — Z952 Presence of prosthetic heart valve: Secondary | ICD-10-CM | POA: Diagnosis not present

## 2015-03-27 DIAGNOSIS — I240 Acute coronary thrombosis not resulting in myocardial infarction: Secondary | ICD-10-CM | POA: Diagnosis not present

## 2015-04-17 DIAGNOSIS — I240 Acute coronary thrombosis not resulting in myocardial infarction: Secondary | ICD-10-CM | POA: Diagnosis not present

## 2015-04-17 DIAGNOSIS — I359 Nonrheumatic aortic valve disorder, unspecified: Secondary | ICD-10-CM | POA: Diagnosis not present

## 2015-04-26 DIAGNOSIS — I240 Acute coronary thrombosis not resulting in myocardial infarction: Secondary | ICD-10-CM | POA: Diagnosis not present

## 2015-04-26 DIAGNOSIS — I359 Nonrheumatic aortic valve disorder, unspecified: Secondary | ICD-10-CM | POA: Diagnosis not present

## 2015-04-26 DIAGNOSIS — J069 Acute upper respiratory infection, unspecified: Secondary | ICD-10-CM | POA: Diagnosis not present

## 2015-04-28 DIAGNOSIS — I25111 Atherosclerotic heart disease of native coronary artery with angina pectoris with documented spasm: Secondary | ICD-10-CM | POA: Diagnosis not present

## 2015-04-28 DIAGNOSIS — I240 Acute coronary thrombosis not resulting in myocardial infarction: Secondary | ICD-10-CM | POA: Diagnosis not present

## 2015-05-12 DIAGNOSIS — E876 Hypokalemia: Secondary | ICD-10-CM | POA: Diagnosis not present

## 2015-05-12 DIAGNOSIS — E782 Mixed hyperlipidemia: Secondary | ICD-10-CM | POA: Diagnosis not present

## 2015-05-12 DIAGNOSIS — F17210 Nicotine dependence, cigarettes, uncomplicated: Secondary | ICD-10-CM | POA: Diagnosis not present

## 2015-05-12 DIAGNOSIS — I359 Nonrheumatic aortic valve disorder, unspecified: Secondary | ICD-10-CM | POA: Diagnosis not present

## 2015-05-12 DIAGNOSIS — E039 Hypothyroidism, unspecified: Secondary | ICD-10-CM | POA: Diagnosis not present

## 2015-05-12 DIAGNOSIS — N183 Chronic kidney disease, stage 3 (moderate): Secondary | ICD-10-CM | POA: Diagnosis not present

## 2015-05-17 DIAGNOSIS — F331 Major depressive disorder, recurrent, moderate: Secondary | ICD-10-CM | POA: Diagnosis not present

## 2015-05-17 DIAGNOSIS — I359 Nonrheumatic aortic valve disorder, unspecified: Secondary | ICD-10-CM | POA: Diagnosis not present

## 2015-05-17 DIAGNOSIS — R0602 Shortness of breath: Secondary | ICD-10-CM | POA: Diagnosis not present

## 2015-05-17 DIAGNOSIS — E782 Mixed hyperlipidemia: Secondary | ICD-10-CM | POA: Diagnosis not present

## 2015-05-17 DIAGNOSIS — I2510 Atherosclerotic heart disease of native coronary artery without angina pectoris: Secondary | ICD-10-CM | POA: Diagnosis not present

## 2015-05-17 DIAGNOSIS — E039 Hypothyroidism, unspecified: Secondary | ICD-10-CM | POA: Diagnosis not present

## 2015-05-17 DIAGNOSIS — F17210 Nicotine dependence, cigarettes, uncomplicated: Secondary | ICD-10-CM | POA: Diagnosis not present

## 2015-05-17 DIAGNOSIS — R55 Syncope and collapse: Secondary | ICD-10-CM | POA: Diagnosis not present

## 2015-05-17 DIAGNOSIS — I719 Aortic aneurysm of unspecified site, without rupture: Secondary | ICD-10-CM | POA: Diagnosis not present

## 2015-05-24 DIAGNOSIS — G9389 Other specified disorders of brain: Secondary | ICD-10-CM | POA: Diagnosis not present

## 2015-05-24 DIAGNOSIS — R51 Headache: Secondary | ICD-10-CM | POA: Diagnosis not present

## 2015-05-24 DIAGNOSIS — R55 Syncope and collapse: Secondary | ICD-10-CM | POA: Diagnosis not present

## 2015-05-26 DIAGNOSIS — I359 Nonrheumatic aortic valve disorder, unspecified: Secondary | ICD-10-CM | POA: Diagnosis not present

## 2015-05-26 DIAGNOSIS — I240 Acute coronary thrombosis not resulting in myocardial infarction: Secondary | ICD-10-CM | POA: Diagnosis not present

## 2015-05-30 ENCOUNTER — Other Ambulatory Visit: Payer: Self-pay | Admitting: Cardiology

## 2015-06-06 DIAGNOSIS — M797 Fibromyalgia: Secondary | ICD-10-CM | POA: Diagnosis not present

## 2015-06-06 DIAGNOSIS — Z79891 Long term (current) use of opiate analgesic: Secondary | ICD-10-CM | POA: Diagnosis not present

## 2015-06-06 DIAGNOSIS — M47816 Spondylosis without myelopathy or radiculopathy, lumbar region: Secondary | ICD-10-CM | POA: Diagnosis not present

## 2015-06-08 ENCOUNTER — Ambulatory Visit: Payer: Commercial Managed Care - HMO | Admitting: Cardiology

## 2015-06-13 ENCOUNTER — Ambulatory Visit: Payer: Commercial Managed Care - HMO | Admitting: Adult Health

## 2015-06-14 ENCOUNTER — Ambulatory Visit: Payer: Commercial Managed Care - HMO | Admitting: Physician Assistant

## 2015-06-14 DIAGNOSIS — I359 Nonrheumatic aortic valve disorder, unspecified: Secondary | ICD-10-CM | POA: Diagnosis not present

## 2015-06-24 ENCOUNTER — Emergency Department (HOSPITAL_COMMUNITY)
Admission: EM | Admit: 2015-06-24 | Discharge: 2015-06-24 | Disposition: A | Discharge status: Home / Self Care | Payer: Commercial Managed Care - HMO | Attending: Emergency Medicine | Admitting: Emergency Medicine

## 2015-06-24 ENCOUNTER — Encounter (HOSPITAL_COMMUNITY): Payer: Self-pay | Admitting: Emergency Medicine

## 2015-06-24 DIAGNOSIS — Z8673 Personal history of transient ischemic attack (TIA), and cerebral infarction without residual deficits: Secondary | ICD-10-CM | POA: Insufficient documentation

## 2015-06-24 DIAGNOSIS — I4891 Unspecified atrial fibrillation: Secondary | ICD-10-CM | POA: Diagnosis not present

## 2015-06-24 DIAGNOSIS — M6281 Muscle weakness (generalized): Secondary | ICD-10-CM | POA: Diagnosis not present

## 2015-06-24 DIAGNOSIS — I959 Hypotension, unspecified: Secondary | ICD-10-CM | POA: Diagnosis not present

## 2015-06-24 DIAGNOSIS — Z7901 Long term (current) use of anticoagulants: Secondary | ICD-10-CM | POA: Insufficient documentation

## 2015-06-24 DIAGNOSIS — F17210 Nicotine dependence, cigarettes, uncomplicated: Secondary | ICD-10-CM | POA: Insufficient documentation

## 2015-06-24 DIAGNOSIS — R011 Cardiac murmur, unspecified: Secondary | ICD-10-CM | POA: Diagnosis not present

## 2015-06-24 DIAGNOSIS — K219 Gastro-esophageal reflux disease without esophagitis: Secondary | ICD-10-CM | POA: Diagnosis not present

## 2015-06-24 DIAGNOSIS — M797 Fibromyalgia: Secondary | ICD-10-CM | POA: Diagnosis not present

## 2015-06-24 DIAGNOSIS — Z79899 Other long term (current) drug therapy: Secondary | ICD-10-CM | POA: Diagnosis not present

## 2015-06-24 DIAGNOSIS — E785 Hyperlipidemia, unspecified: Secondary | ICD-10-CM | POA: Diagnosis not present

## 2015-06-24 DIAGNOSIS — R52 Pain, unspecified: Secondary | ICD-10-CM | POA: Diagnosis not present

## 2015-06-24 DIAGNOSIS — E876 Hypokalemia: Secondary | ICD-10-CM | POA: Diagnosis not present

## 2015-06-24 DIAGNOSIS — D696 Thrombocytopenia, unspecified: Secondary | ICD-10-CM | POA: Diagnosis not present

## 2015-06-24 DIAGNOSIS — M1990 Unspecified osteoarthritis, unspecified site: Secondary | ICD-10-CM | POA: Diagnosis not present

## 2015-06-24 DIAGNOSIS — I2510 Atherosclerotic heart disease of native coronary artery without angina pectoris: Secondary | ICD-10-CM | POA: Insufficient documentation

## 2015-06-24 DIAGNOSIS — R531 Weakness: Secondary | ICD-10-CM

## 2015-06-24 DIAGNOSIS — Z9889 Other specified postprocedural states: Secondary | ICD-10-CM | POA: Insufficient documentation

## 2015-06-24 DIAGNOSIS — F329 Major depressive disorder, single episode, unspecified: Secondary | ICD-10-CM | POA: Insufficient documentation

## 2015-06-24 DIAGNOSIS — F419 Anxiety disorder, unspecified: Secondary | ICD-10-CM | POA: Diagnosis not present

## 2015-06-24 DIAGNOSIS — Z86010 Personal history of colonic polyps: Secondary | ICD-10-CM | POA: Diagnosis not present

## 2015-06-24 DIAGNOSIS — G8929 Other chronic pain: Secondary | ICD-10-CM | POA: Diagnosis not present

## 2015-06-24 DIAGNOSIS — Z8719 Personal history of other diseases of the digestive system: Secondary | ICD-10-CM

## 2015-06-24 LAB — CBG MONITORING, ED: Glucose-Capillary: 83 mg/dL (ref 65–99)

## 2015-06-24 LAB — URINALYSIS, ROUTINE W REFLEX MICROSCOPIC
Bilirubin Urine: NEGATIVE
GLUCOSE, UA: NEGATIVE mg/dL
Hgb urine dipstick: NEGATIVE
Ketones, ur: NEGATIVE mg/dL
LEUKOCYTES UA: NEGATIVE
NITRITE: NEGATIVE
PH: 6 (ref 5.0–8.0)
Protein, ur: NEGATIVE mg/dL
Specific Gravity, Urine: 1.004 — ABNORMAL LOW (ref 1.005–1.030)

## 2015-06-24 LAB — BASIC METABOLIC PANEL
Anion gap: 8 (ref 5–15)
BUN: 11 mg/dL (ref 6–20)
CO2: 24 mmol/L (ref 22–32)
Calcium: 8.7 mg/dL — ABNORMAL LOW (ref 8.9–10.3)
Chloride: 111 mmol/L (ref 101–111)
Creatinine, Ser: 1.16 mg/dL (ref 0.61–1.24)
Glucose, Bld: 84 mg/dL (ref 65–99)
POTASSIUM: 3.2 mmol/L — AB (ref 3.5–5.1)
SODIUM: 143 mmol/L (ref 135–145)

## 2015-06-24 LAB — CBC
HEMATOCRIT: 35 % — AB (ref 39.0–52.0)
Hemoglobin: 11.8 g/dL — ABNORMAL LOW (ref 13.0–17.0)
MCH: 30.8 pg (ref 26.0–34.0)
MCHC: 33.7 g/dL (ref 30.0–36.0)
MCV: 91.4 fL (ref 78.0–100.0)
Platelets: 134 10*3/uL — ABNORMAL LOW (ref 150–400)
RBC: 3.83 MIL/uL — ABNORMAL LOW (ref 4.22–5.81)
RDW: 15.4 % (ref 11.5–15.5)
WBC: 5.8 10*3/uL (ref 4.0–10.5)

## 2015-06-24 MED ORDER — POTASSIUM CHLORIDE CRYS ER 20 MEQ PO TBCR
40.0000 meq | EXTENDED_RELEASE_TABLET | Freq: Once | ORAL | Status: AC
  Administered 2015-06-24: 40 meq via ORAL
  Filled 2015-06-24: qty 2

## 2015-06-24 MED ORDER — GABAPENTIN 300 MG PO CAPS: 300.0000 mg | ORAL_CAPSULE | Freq: Three times a day (TID) | ORAL | Status: DC

## 2015-06-24 NOTE — ED Notes (Signed)
[facility] notified of transportion

## 2015-06-24 NOTE — Progress Notes (Addendum)
CM received call to please arrange home health services for pt.  This CM is physically at the [facility] and has called to the pt who agrees to have [facility] render HHPT/OT/SW for eval and treat.  CM has requested face to face and HHPt/OT/SW orders. Referral called to [facility] rep, Tiffany.  Pt states he has a rolling walker at home and does not need other DME.  CM notes pt followed by [facility] (last note 12/2014) and [facility] consult placed to follow pt 1-3 days post discharge.   No other CM needs were communicated.

## 2015-06-24 NOTE — Discharge Instructions (Signed)
It was our pleasure to provide your ER care today - we hope that you feel better.  Rest. Drink adequate fluids. Eat balanced diet.  For constipation - drink adequate fluids, get adequate fiber in diet, avoid taking narcotic pain medication, try taking colace (stool softener) and miralax (laxative) as need - these medications as available over the counter.   From today's lab tests, your potassium level is mildly low (3.2) - eat plenty of fruits and vegetables, take one extra of your potassium supplement each day for the next 1 week, and follow up with your doctor for recheck in 1 week.  Also from today's results, your platelet count is low (134) - follow up with your doctor in the coming week.  Our case manager has made referrals for home health services which should start in the next couple days.  Follow up with your primary care doctor in the next few days as well - call their office Monday to arrange follow up.   Return to ER if worse, new symptoms, fevers, trouble breathing, fainting/falls, other concern.     Weakness Weakness is a lack of strength. It may be felt all over the body (generalized) or in one specific part of the body (focal). Some causes of weakness can be serious. You may need further medical evaluation, especially if you are elderly or you have a history of immunosuppression (such as chemotherapy or HIV), kidney disease, heart disease, or diabetes. CAUSES  Weakness can be caused by many different things, including:  Infection.  Physical exhaustion.  Internal bleeding or other blood loss that results in a lack of red blood cells (anemia).  Dehydration. This cause is more common in elderly people.  Side effects or electrolyte abnormalities from medicines, such as pain medicines or sedatives.  Emotional distress, anxiety, or depression.  Circulation problems, especially severe peripheral arterial disease.  Heart disease, such as rapid atrial fibrillation,  bradycardia, or heart failure.  Nervous system disorders, such as Guillain-Barr syndrome, multiple sclerosis, or stroke. DIAGNOSIS  To find the cause of your weakness, your caregiver will take your history and perform a physical exam. Lab tests or X-rays may also be ordered, if needed. TREATMENT  Treatment of weakness depends on the cause of your symptoms and can vary greatly. HOME CARE INSTRUCTIONS   Rest as needed.  Eat a well-balanced diet.  Try to get some exercise every day.  Only take over-the-counter or prescription medicines as directed by your caregiver. SEEK MEDICAL CARE IF:   Your weakness seems to be getting worse or spreads to other parts of your body.  You develop new aches or pains. SEEK IMMEDIATE MEDICAL CARE IF:   You cannot perform your normal daily activities, such as getting dressed and feeding yourself.  You cannot walk up and down stairs, or you feel exhausted when you do so.  You have shortness of breath or chest pain.  You have difficulty moving parts of your body.  You have weakness in only one area of the body or on only one side of the body.  You have a fever.  You have trouble speaking or swallowing.  You cannot control your bladder or bowel movements.  You have black or bloody vomit or stools. MAKE SURE YOU:  Understand these instructions.  Will watch your condition.  Will get help right away if you are not doing well or get worse.   This information is not intended to replace advice given to you by your health care  provider. Make sure you discuss any questions you have with your health care provider.   Document Released: 03/18/2005 Document Revised: 09/17/2011 Document Reviewed: 05/17/2011 Elsevier Interactive Patient Education 2016 [employer].  Hypokalemia Hypokalemia means that the amount of potassium in the blood is lower than normal.Potassium is a chemical, called an electrolyte, that helps regulate the amount of fluid in  the body. It also stimulates muscle contraction and helps nerves function properly.Most of the body's potassium is inside of cells, and only a very small amount is in the blood. Because the amount in the blood is so small, minor changes can be life-threatening. CAUSES  Antibiotics.  Diarrhea or vomiting.  Using laxatives too much, which can cause diarrhea.  Chronic kidney disease.  Water pills (diuretics).  Eating disorders (bulimia).  Low magnesium level.  Sweating a lot. SIGNS AND SYMPTOMS  Weakness.  Constipation.  Fatigue.  Muscle cramps.  Mental confusion.  Skipped heartbeats or irregular heartbeat (palpitations).  Tingling or numbness. DIAGNOSIS  Your health care provider can diagnose hypokalemia with blood tests. In addition to checking your potassium level, your health care provider may also check other lab tests. TREATMENT Hypokalemia can be treated with potassium supplements taken by mouth or adjustments in your current medicines. If your potassium level is very low, you may need to get potassium through a vein (IV) and be monitored in the hospital. A diet high in potassium is also helpful. Foods high in potassium are:  Nuts, such as peanuts and pistachios.  Seeds, such as sunflower seeds and pumpkin seeds.  Peas, lentils, and lima beans.  Whole grain and bran cereals and breads.  Fresh fruit and vegetables, such as apricots, avocado, bananas, cantaloupe, kiwi, oranges, tomatoes, asparagus, and potatoes.  Orange and tomato juices.  Red meats.  Fruit yogurt. HOME CARE INSTRUCTIONS  Take all medicines as prescribed by your health care provider.  Maintain a healthy diet by including nutritious food, such as fruits, vegetables, nuts, whole grains, and lean meats.  If you are taking a laxative, be sure to follow the directions on the label. SEEK MEDICAL CARE IF:  Your weakness gets worse.  You feel your heart pounding or racing.  You are  vomiting or having diarrhea.  You are diabetic and having trouble keeping your blood glucose in the normal range. SEEK IMMEDIATE MEDICAL CARE IF:  You have chest pain, shortness of breath, or dizziness.  You are vomiting or having diarrhea for more than 2 days.  You faint. MAKE SURE YOU:   Understand these instructions.  Will watch your condition.  Will get help right away if you are not doing well or get worse.   This information is not intended to replace advice given to you by your health care provider. Make sure you discuss any questions you have with your health care provider.   Document Released: 03/18/2005 Document Revised: 04/08/2014 Document Reviewed: 09/18/2012 Elsevier Interactive Patient Education 2016 [employer].  Constipation, Adult Constipation is when a person has fewer than three bowel movements a week, has difficulty having a bowel movement, or has stools that are dry, hard, or larger than normal. As people grow older, constipation is more common. A low-fiber diet, not taking in enough fluids, and taking certain medicines may make constipation worse.  CAUSES   Certain medicines, such as antidepressants, pain medicine, iron supplements, antacids, and water pills.   Certain diseases, such as diabetes, irritable bowel syndrome (IBS), thyroid disease, or depression.   Not drinking enough water.  Not eating enough fiber-rich foods.   Stress or travel.   Lack of physical activity or exercise.   Ignoring the urge to have a bowel movement.   Using laxatives too much.  SIGNS AND SYMPTOMS   Having fewer than three bowel movements a week.   Straining to have a bowel movement.   Having stools that are hard, dry, or larger than normal.   Feeling full or bloated.   Pain in the lower abdomen.   Not feeling relief after having a bowel movement.  DIAGNOSIS  Your health care provider will take a medical history and perform a physical exam.  Further testing may be done for severe constipation. Some tests may include:  A barium enema X-ray to examine your rectum, colon, and, sometimes, your small intestine.   A sigmoidoscopy to examine your lower colon.   A colonoscopy to examine your entire colon. TREATMENT  Treatment will depend on the severity of your constipation and what is causing it. Some dietary treatments include drinking more fluids and eating more fiber-rich foods. Lifestyle treatments may include regular exercise. If these diet and lifestyle recommendations do not help, your health care provider may recommend taking over-the-counter laxative medicines to help you have bowel movements. Prescription medicines may be prescribed if over-the-counter medicines do not work.  HOME CARE INSTRUCTIONS   Eat foods that have a lot of fiber, such as fruits, vegetables, whole grains, and beans.  Limit foods high in fat and processed sugars, such as french fries, hamburgers, cookies, candies, and soda.   A fiber supplement may be added to your diet if you cannot get enough fiber from foods.   Drink enough fluids to keep your urine clear or pale yellow.   Exercise regularly or as directed by your health care provider.   Go to the restroom when you have the urge to go. Do not hold it.   Only take over-the-counter or prescription medicines as directed by your health care provider. Do not take other medicines for constipation without talking to your health care provider first.  [place] IF:   You have bright red blood in your stool.   Your constipation lasts for more than 4 days or gets worse.   You have abdominal or rectal pain.   You have thin, pencil-like stools.   You have unexplained weight loss. MAKE SURE YOU:   Understand these instructions.  Will watch your condition.  Will get help right away if you are not doing well or get worse.   This information is not intended to replace  advice given to you by your health care provider. Make sure you discuss any questions you have with your health care provider.   Document Released: 12/15/2003 Document Revised: 04/08/2014 Document Reviewed: 12/28/2012 Elsevier Interactive Patient Education [employer].

## 2015-06-24 NOTE — ED Notes (Signed)
Pt is aware we need a urine sample. He will notify when he has voided.

## 2015-06-24 NOTE — ED Notes (Signed)
PTAR at bedside 

## 2015-06-24 NOTE — ED Notes (Signed)
Bed: WA07 Expected date:  Expected time:  Means of arrival:  Comments: [facility] EMS/weak/falls

## 2015-06-24 NOTE — ED Notes (Signed)
Discharge instructions and follow up care reviewed with patient. Patient verbalized understanding. 

## 2015-06-24 NOTE — ED Notes (Signed)
Per EMS, patient states he is having generalized weakness/does not feel well. Patient states his bilateral leg/ankles become weaker everyday; feels fatigued. Patient has cardiac history - valve replaced a couple years ago.

## 2015-06-24 NOTE — ED Notes (Signed)
Patient given apple juice, [place] sandwich, graham crackers, and peanut butter.

## 2015-06-24 NOTE — ED Provider Notes (Addendum)
CSN: [id number]     Arrival date & time 06/24/15  1316 History   First MD Initiated Contact with Patient 06/24/15 1421     No chief complaint on file.    (Consider location/radiation/quality/duration/timing/severity/associated sxs/prior Treatment) The history is provided by the patient and the EMS personnel.  Patient c/o feeling generally weak for the past couple years since his last bypass surgery.  States 3 weeks ago felt generally weak, had near syncopal event at home - states saw his pcp after that episode.  Denies any acute or abrupt change or worsening in symptoms today, or this week. Normal appetite. No vomiting or diarrhea. Recent mild constipation, last bm a couple days ago. No distension. No cough or uri c/o. No current or recent chest pain or discomfort. No sob, no new/worsening dyspnea w exertion. Compliant w normal meds, pt denies recent change in meds or doses. No fever or chills. Denies trauma or fall.  Pt also indicates he thinks he needs to have home health services set up.      Past Medical History  Diagnosis Date  . Hyperlipidemia   . Aortic stenosis     Status post AVR 5/13  . Coronary atherosclerosis of native coronary artery     Nonobstructive  . Dyslipidemia   . Fibromyalgia   . Peripheral vascular disease ([place])   . Tinnitus     Following prior seizures  . Seizures ([place])     6 yrs ago from withdrawal of Xanax to quickly  . Long-term memory loss   . Stroke ([place])   . Arthritis   . Chronic back pain     Lumbar spondylosis  . GERD (gastroesophageal reflux disease)   . Hx of colonic polyps   . Nocturia   . Major depressive disorder ([place])     Prior suicidal attempts  . Anxiety   . Thoracic aortic aneurysm [facility])     Status post repair 7/13  . Atrial fibrillation [facility])    Past Surgical History  Procedure Laterality Date  . Inguinal exploration      Right  . Femoral hernia repair      x2  . Umbilical hernia repair      [facility]  . Finger contracture  release      Tendon release operation on his right little finger  . Appendectomy    . Cholecystectomy      [facility]  . Incisional hernia repair  05/01/2011    Procedure: HERNIA REPAIR INCISIONAL;  Surgeon: [name], MD;  Location: [facility];  Service: General;  Laterality: N/A;  with Mesh  . Tonsillectomy      at age 89  . Circumcision    . Cardiac catheterization    . Colonoscopy    . Esophagogastroduodenoscopy  04/07/08/13  . Aortic valve replacement  08/15/2011    Procedure: AORTIC VALVE REPLACEMENT (AVR);  Surgeon: [name], MD;  Location: [place];  Service: Open Heart Surgery;  Laterality: N/A;  . Sternotomy  10/07/2011    Procedure: STERNOTOMY;  Surgeon: [name], MD;  Location: [place];  Service: Open Heart Surgery;  Laterality: N/A;  . Thoracic aortic aneurysm repair  10/07/2011    Procedure: THORACIC ASCENDING ANEURYSM REPAIR (AAA);  Surgeon: [name], MD;  Location: [place];  Service: Open Heart Surgery;  Laterality: N/A;  . Mediastinal exploration  10/08/2011    Procedure: MEDIASTINAL EXPLORATION;  Surgeon: [name], MD;  Location: [place];  Service: Open Heart  Surgery;  Laterality: N/A;   Family History  Problem Relation Age of Onset  . Cancer    . Stroke    . Anesthesia problems Neg Hx   . Hypotension Neg Hx   . Malignant hyperthermia Neg Hx   . Pseudochol deficiency Neg Hx    Social History  Substance Use Topics  . Smoking status: Current Every Day Smoker -- 0.50 packs/day for 48 years    Types: Cigarettes    Start date: 11/08/1962  . Smokeless tobacco: Never Used  . Alcohol Use: No     Comment: quit 13yr ago    Review of Systems  Constitutional: Negative for fever and chills.  HENT: Negative for sore throat.   Eyes: Negative for visual disturbance.  Respiratory: Negative for cough and shortness of breath.   Cardiovascular: Negative for chest pain.  Gastrointestinal: Negative for vomiting, abdominal pain and diarrhea.   Endocrine: Negative for polyuria.  Genitourinary: Negative for dysuria and flank pain.  Musculoskeletal: Negative for back pain and neck pain.  Skin: Negative for rash.  Neurological: Negative for headaches.  Hematological: Does not bruise/bleed easily.  Psychiatric/Behavioral: Negative for confusion.      Allergies  Bupropion and Atorvastatin  Home Medications   Prior to Admission medications   Medication Sig Start Date End Date Taking? Authorizing Provider  ALPRAZolam Duanne Moron) 1 MG tablet Take 1 mg by mouth 5 (five) times daily as needed for anxiety.  09/30/11  Yes Historical Provider, MD  Artificial Saliva (BIOTENE MOISTURIZING MOUTH) SOLN Use as directed 1-2 sprays in the mouth or throat 3 (three) times daily.   Yes Historical Provider, MD  atorvastatin (LIPITOR) 80 MG tablet Take 80 mg by mouth daily.   Yes Historical Provider, MD  baclofen (LIORESAL) 10 MG tablet Take 10 mg by mouth 3 (three) times daily.   Yes Historical Provider, MD  DULoxetine (CYMBALTA) 60 MG capsule Take 60 mg by mouth daily.   Yes Historical Provider, MD  furosemide (LASIX) 20 MG tablet Take 20 mg by mouth daily.   Yes Historical Provider, MD  gabapentin (NEURONTIN) 300 MG capsule Take 600 mg by mouth 3 (three) times daily.   Yes Historical Provider, MD  levothyroxine (SYNTHROID, LEVOTHROID) 88 MCG tablet Take 88 mcg by mouth daily before breakfast.   Yes Historical Provider, MD  lisinopril-hydrochlorothiazide (PRINZIDE) 10-12.5 MG per tablet Take 1 tablet by mouth daily. 12/01/13  Yes Satira Sark, MD  metoprolol tartrate (LOPRESSOR) 25 MG tablet TAKE ONE TABLET BY MOUTH THREE TIMES DAILY Patient taking differently: TAKE ONE TABLET BY MOUTH DAILY. 05/30/15  Yes Satira Sark, MD  NITROSTAT 0.4 MG SL tablet Place 0.4 mg under the tongue every 5 (five) minutes as needed for chest pain.  07/11/12  Yes Historical Provider, MD  oxyCODONE-acetaminophen (PERCOCET/ROXICET) 5-325 MG per tablet Take 1 tablet by  mouth 4 (four) times daily as needed for severe pain.   Yes Historical Provider, MD  pantoprazole (PROTONIX) 40 MG tablet Take 40 mg by mouth daily.     Yes Historical Provider, MD  potassium chloride (K-DUR) 10 MEQ tablet Take 1 tablet (10 mEq total) by mouth daily. 12/17/13  Yes Satira Sark, MD  promethazine (PHENERGAN) 25 MG tablet Take 25 mg by mouth every 6 (six) hours as needed for nausea or vomiting.    Yes Historical Provider, MD  traZODone (DESYREL) 50 MG tablet Take 50 mg by mouth at bedtime as needed for sleep.   Yes Historical Provider, MD  warfarin (COUMADIN) 2 MG tablet Take 1-1.5 tablets (2-3 mg total) by mouth daily. Patient takes 1 tablet(2mg ) on Saturday and Wednesday and 1&1/2(3mg ) all other days Patient taking differently: Take 3 mg by mouth daily.  09/13/13  Yes Satira Sark, MD   BP 137/59 mmHg  Pulse 70  Temp(Src) 98.8 F (37.1 C) (Oral)  Resp 13  Ht 6' (1.829 m)  Wt 95.255 kg  BMI 28.47 kg/m2  SpO2 93% Physical Exam  Constitutional: He is oriented to person, place, and time. He appears well-developed and well-nourished. No distress.  HENT:  Head: Atraumatic.  Mouth/Throat: Oropharynx is clear and moist.  Eyes: Conjunctivae are normal. Pupils are equal, round, and reactive to light. No scleral icterus.  Neck: Neck supple. No tracheal deviation present. No thyromegaly present.  No bruits  Cardiovascular: Normal rate, regular rhythm and intact distal pulses.  Exam reveals no gallop and no friction rub.   Murmur heard. Prosthetic heart sounds/click.   Pulmonary/Chest: Effort normal and breath sounds normal. No accessory muscle usage. No respiratory distress.  Abdominal: Soft. Bowel sounds are normal. He exhibits no distension. There is no tenderness.  Genitourinary:  No cva tenderness  Musculoskeletal: Normal range of motion. He exhibits no edema or tenderness.  Neurological: He is alert and oriented to person, place, and time.  No facial droop or  asymmetry. No pronator drift. Equal grip. stre 5/5 bil. sens grossly intact.   Skin: Skin is warm and dry. No rash noted. He is not diaphoretic.  Psychiatric: He has a normal mood and affect.  Nursing note and vitals reviewed.   ED Course  Procedures (including critical care time) Labs Review  Results for orders placed or performed during the hospital encounter of [id number]  Basic metabolic panel  Result Value Ref Range   Sodium 143 135 - 145 mmol/L   Potassium 3.2 (L) 3.5 - 5.1 mmol/L   Chloride 111 101 - 111 mmol/L   CO2 24 22 - 32 mmol/L   Glucose, Bld 84 65 - 99 mg/dL   BUN 11 6 - 20 mg/dL   Creatinine, Ser 1.16 0.61 - 1.24 mg/dL   Calcium 8.7 (L) 8.9 - 10.3 mg/dL   GFR calc non Af Amer >60 >60 mL/min   GFR calc Af Amer >60 >60 mL/min   Anion gap 8 5 - 15  CBC  Result Value Ref Range   WBC 5.8 4.0 - 10.5 K/uL   RBC 3.83 (L) 4.22 - 5.81 MIL/uL   Hemoglobin 11.8 (L) 13.0 - 17.0 g/dL   HCT 35.0 (L) 39.0 - 52.0 %   MCV 91.4 78.0 - 100.0 fL   MCH 30.8 26.0 - 34.0 pg   MCHC 33.7 30.0 - 36.0 g/dL   RDW 15.4 11.5 - 15.5 %   Platelets 134 (L) 150 - 400 K/uL  Urinalysis, Routine w reflex microscopic (not at [facility])  Result Value Ref Range   Color, Urine YELLOW YELLOW   APPearance CLEAR CLEAR   Specific Gravity, Urine 1.004 (L) 1.005 - 1.030   pH 6.0 5.0 - 8.0   Glucose, UA NEGATIVE NEGATIVE mg/dL   Hgb urine dipstick NEGATIVE NEGATIVE   Bilirubin Urine NEGATIVE NEGATIVE   Ketones, ur NEGATIVE NEGATIVE mg/dL   Protein, ur NEGATIVE NEGATIVE mg/dL   Nitrite NEGATIVE NEGATIVE   Leukocytes, UA NEGATIVE NEGATIVE  CBG monitoring, ED  Result Value Ref Range   Glucose-Capillary 83 65 - 99 mg/dL       I have personally reviewed  and evaluated these images and lab results as part of my medical decision-making.   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 Labs.  Po fluids.   Reviewed nursing notes and prior charts for additional history.   kcl po.  Discussed pt with case manager - they indicate they will speak w patient and set up home health services, requests order in chart which was placed.  Recheck pt, alert, content. No distress.   Pt currently appears stable for d/c.   Pt indicates out of gabapentin - requests rx until can see pcp - will provide.       Lajean Saver, MD 06/24/15 [phone]

## 2015-06-26 ENCOUNTER — Other Ambulatory Visit: Payer: Self-pay | Admitting: *Deleted

## 2015-06-26 NOTE — Patient Outreach (Signed)
06/26/15- Referral received from ED, telephone call to patient for screening, no answer to telephone, no option to leave voicemail, will attempt at later date.  [name] [facility], [place] Coordinator [phone]

## 2015-06-27 DIAGNOSIS — I4891 Unspecified atrial fibrillation: Secondary | ICD-10-CM | POA: Diagnosis not present

## 2015-06-27 DIAGNOSIS — I10 Essential (primary) hypertension: Secondary | ICD-10-CM | POA: Diagnosis not present

## 2015-06-27 DIAGNOSIS — Z5181 Encounter for therapeutic drug level monitoring: Secondary | ICD-10-CM | POA: Diagnosis not present

## 2015-06-27 DIAGNOSIS — Z7901 Long term (current) use of anticoagulants: Secondary | ICD-10-CM | POA: Diagnosis not present

## 2015-06-27 DIAGNOSIS — I2510 Atherosclerotic heart disease of native coronary artery without angina pectoris: Secondary | ICD-10-CM | POA: Diagnosis not present

## 2015-06-27 DIAGNOSIS — G40409 Other generalized epilepsy and epileptic syndromes, not intractable, without status epilepticus: Secondary | ICD-10-CM | POA: Diagnosis not present

## 2015-06-27 DIAGNOSIS — I739 Peripheral vascular disease, unspecified: Secondary | ICD-10-CM | POA: Diagnosis not present

## 2015-06-27 DIAGNOSIS — M797 Fibromyalgia: Secondary | ICD-10-CM | POA: Diagnosis not present

## 2015-06-27 DIAGNOSIS — E782 Mixed hyperlipidemia: Secondary | ICD-10-CM | POA: Diagnosis not present

## 2015-06-28 ENCOUNTER — Encounter: Payer: Self-pay | Admitting: *Deleted

## 2015-06-28 ENCOUNTER — Other Ambulatory Visit: Payer: Self-pay | Admitting: *Deleted

## 2015-06-28 DIAGNOSIS — I4891 Unspecified atrial fibrillation: Secondary | ICD-10-CM | POA: Diagnosis not present

## 2015-06-28 DIAGNOSIS — E782 Mixed hyperlipidemia: Secondary | ICD-10-CM | POA: Diagnosis not present

## 2015-06-28 DIAGNOSIS — G40409 Other generalized epilepsy and epileptic syndromes, not intractable, without status epilepticus: Secondary | ICD-10-CM | POA: Diagnosis not present

## 2015-06-28 DIAGNOSIS — I739 Peripheral vascular disease, unspecified: Secondary | ICD-10-CM | POA: Diagnosis not present

## 2015-06-28 DIAGNOSIS — Z5181 Encounter for therapeutic drug level monitoring: Secondary | ICD-10-CM | POA: Diagnosis not present

## 2015-06-28 DIAGNOSIS — I2510 Atherosclerotic heart disease of native coronary artery without angina pectoris: Secondary | ICD-10-CM | POA: Diagnosis not present

## 2015-06-28 DIAGNOSIS — Z7901 Long term (current) use of anticoagulants: Secondary | ICD-10-CM | POA: Diagnosis not present

## 2015-06-28 DIAGNOSIS — M797 Fibromyalgia: Secondary | ICD-10-CM | POA: Diagnosis not present

## 2015-06-28 DIAGNOSIS — F332 Major depressive disorder, recurrent severe without psychotic features: Secondary | ICD-10-CM

## 2015-06-28 DIAGNOSIS — I10 Essential (primary) hypertension: Secondary | ICD-10-CM | POA: Diagnosis not present

## 2015-06-28 NOTE — Patient Outreach (Addendum)
06/28/15- Referral received from ED, telephone call to patient for outreach followup screening, spoke with pt. HIPAA verified, pt reports he lives alone and is alienated from his family and has been for years.  Pt reports [facility] is seeing him now RN and PT, pt reports he sees primary MD q 3 months, pt rides [place] for appointments and has landlord that assists him at times, pt receives meals on wheels, has all medications and can afford, pt reports he is supposed to be getting a car this Friday, pt reports his main problem is depression and states "I've had this for years and it's in my head and not going away, so many problems with my family not talking to me"  RN CM discussed options for depression and pt states he is not "going back to therapy (group or otherwise), because it didn't help me at all and I've tried it all"  Pt reports primary MD prescribes medication for depression and medications have been changed/adjusted multiple times.  RN CM called [facility] CSW Theadore Nan for collaboration as CSW has worked with pt in the past, [place] states he will collaborate with [employer] for support with mental health issues as [facility] Rossetti has worked with pt in the past.  RN CM sent referral to [place] for mental health needs/ PHQ9 score of 14, no nursing needs identified.  RN CM faxed today's note to [name] reporting [facility] will be working with pt regarding depression/ mental health needs, sent depression screening.  Depression screen [facility] 2/9 06/28/2015 01/23/2015 07/05/2014  Decreased Interest 2 1 1   Down, Depressed, Hopeless 3 1 1   PHQ - 2 Score 5 2 2   Altered sleeping 1 3 3   Tired, decreased energy 1 3 3   Change in appetite 2 1 2   Feeling bad or failure about yourself  2 1 2   Trouble concentrating 1 3 3   Moving slowly or fidgety/restless 2 3 3   Suicidal thoughts 0 1 3  PHQ-9 Score 14 17 21   Difficult doing work/chores Very difficult Somewhat difficult Somewhat difficult     [name] [facility], [place] Coordinator [phone]

## 2015-06-29 ENCOUNTER — Encounter: Payer: Self-pay | Admitting: Licensed Clinical Social Worker

## 2015-06-29 ENCOUNTER — Other Ambulatory Visit: Payer: Self-pay | Admitting: Licensed Clinical Social Worker

## 2015-06-29 DIAGNOSIS — Z5181 Encounter for therapeutic drug level monitoring: Secondary | ICD-10-CM | POA: Diagnosis not present

## 2015-06-29 DIAGNOSIS — M797 Fibromyalgia: Secondary | ICD-10-CM | POA: Diagnosis not present

## 2015-06-29 DIAGNOSIS — I10 Essential (primary) hypertension: Secondary | ICD-10-CM | POA: Diagnosis not present

## 2015-06-29 DIAGNOSIS — E782 Mixed hyperlipidemia: Secondary | ICD-10-CM | POA: Diagnosis not present

## 2015-06-29 DIAGNOSIS — I4891 Unspecified atrial fibrillation: Secondary | ICD-10-CM | POA: Diagnosis not present

## 2015-06-29 DIAGNOSIS — G40409 Other generalized epilepsy and epileptic syndromes, not intractable, without status epilepticus: Secondary | ICD-10-CM | POA: Diagnosis not present

## 2015-06-29 DIAGNOSIS — Z7901 Long term (current) use of anticoagulants: Secondary | ICD-10-CM | POA: Diagnosis not present

## 2015-06-29 DIAGNOSIS — I739 Peripheral vascular disease, unspecified: Secondary | ICD-10-CM | POA: Diagnosis not present

## 2015-06-29 DIAGNOSIS — I2510 Atherosclerotic heart disease of native coronary artery without angina pectoris: Secondary | ICD-10-CM | POA: Diagnosis not present

## 2015-06-29 NOTE — Patient Outreach (Signed)
Assessment:  CSW received referral on [name]. CSW completed chart review of client.  Client sees Dr. Gar Ponto as client's primary care doctor.  CSW called client on 06/29/15.  CSW verified client identity. Client gave CSW verbal permission on 06/29/15 for CSW to talk with client about client needs.. CSW and [name] spoke of client needs. Client said he has appointment with cardiologist on 07/26/15.  Client said he has been going to [facility] as [facility].  He has been seeing Dr. Sampson Goon for pain management as scheduled. CSW and client spoke of care plan. Client agreed to try to attend all scheduled client medical appointments in the next 30 days.  CSW and client completed client assessments needed.  Client has previously completed [facility] consent form.  CSW and client spoke of client mental health needs. Client said he  prefers not to go to group counseling sessions.  He said he is taking his medications as prescribed. But, client does not really want to go to group counseling sessions or individual counseling sessions. Client said he would be interested in telephonic counseling support. Client had previously received telephonic counseling support with [name]. Client said he would be interested in possibly receiving telephone counseling support with [name] representative. Client said he is having more difficulty walking outside of home. He said he has swelling in his ankles and legs if he walks a long distance.  He said he uses a cane to help him walk. Client said he has appointment with eye doctor next week.  Client said that a physical therapy representative from [name] conducted home visit with client on 06/28/15. Client understands that he may receive in home physical therapy sessions for a short period of time from [name].  CSW thanked client for phone conversation with CSW on 06/29/15  CSW encouraged [name] to call CSW at [phone] as needed to  discuss social work needs of client.    Plan: Client to attend all scheduled client medical appointments in the next 30 days. CSW to call client in two weeks to assess client needs.  [name].[name] MSW, LCSW Licensed Clinical Social Worker [facility] Care Management [phone]

## 2015-06-30 DIAGNOSIS — G40409 Other generalized epilepsy and epileptic syndromes, not intractable, without status epilepticus: Secondary | ICD-10-CM | POA: Diagnosis not present

## 2015-06-30 DIAGNOSIS — E782 Mixed hyperlipidemia: Secondary | ICD-10-CM | POA: Diagnosis not present

## 2015-06-30 DIAGNOSIS — I2510 Atherosclerotic heart disease of native coronary artery without angina pectoris: Secondary | ICD-10-CM | POA: Diagnosis not present

## 2015-06-30 DIAGNOSIS — Z7901 Long term (current) use of anticoagulants: Secondary | ICD-10-CM | POA: Diagnosis not present

## 2015-06-30 DIAGNOSIS — I4891 Unspecified atrial fibrillation: Secondary | ICD-10-CM | POA: Diagnosis not present

## 2015-06-30 DIAGNOSIS — I10 Essential (primary) hypertension: Secondary | ICD-10-CM | POA: Diagnosis not present

## 2015-06-30 DIAGNOSIS — Z5181 Encounter for therapeutic drug level monitoring: Secondary | ICD-10-CM | POA: Diagnosis not present

## 2015-06-30 DIAGNOSIS — M797 Fibromyalgia: Secondary | ICD-10-CM | POA: Diagnosis not present

## 2015-06-30 DIAGNOSIS — I739 Peripheral vascular disease, unspecified: Secondary | ICD-10-CM | POA: Diagnosis not present

## 2015-07-03 DIAGNOSIS — G40409 Other generalized epilepsy and epileptic syndromes, not intractable, without status epilepticus: Secondary | ICD-10-CM | POA: Diagnosis not present

## 2015-07-03 DIAGNOSIS — Z5181 Encounter for therapeutic drug level monitoring: Secondary | ICD-10-CM | POA: Diagnosis not present

## 2015-07-03 DIAGNOSIS — E782 Mixed hyperlipidemia: Secondary | ICD-10-CM | POA: Diagnosis not present

## 2015-07-03 DIAGNOSIS — I739 Peripheral vascular disease, unspecified: Secondary | ICD-10-CM | POA: Diagnosis not present

## 2015-07-03 DIAGNOSIS — M797 Fibromyalgia: Secondary | ICD-10-CM | POA: Diagnosis not present

## 2015-07-03 DIAGNOSIS — I4891 Unspecified atrial fibrillation: Secondary | ICD-10-CM | POA: Diagnosis not present

## 2015-07-03 DIAGNOSIS — I240 Acute coronary thrombosis not resulting in myocardial infarction: Secondary | ICD-10-CM | POA: Diagnosis not present

## 2015-07-03 DIAGNOSIS — Z7901 Long term (current) use of anticoagulants: Secondary | ICD-10-CM | POA: Diagnosis not present

## 2015-07-03 DIAGNOSIS — I2510 Atherosclerotic heart disease of native coronary artery without angina pectoris: Secondary | ICD-10-CM | POA: Diagnosis not present

## 2015-07-03 DIAGNOSIS — I10 Essential (primary) hypertension: Secondary | ICD-10-CM | POA: Diagnosis not present

## 2015-07-06 DIAGNOSIS — Z5181 Encounter for therapeutic drug level monitoring: Secondary | ICD-10-CM | POA: Diagnosis not present

## 2015-07-06 DIAGNOSIS — I2510 Atherosclerotic heart disease of native coronary artery without angina pectoris: Secondary | ICD-10-CM | POA: Diagnosis not present

## 2015-07-06 DIAGNOSIS — E782 Mixed hyperlipidemia: Secondary | ICD-10-CM | POA: Diagnosis not present

## 2015-07-06 DIAGNOSIS — I4891 Unspecified atrial fibrillation: Secondary | ICD-10-CM | POA: Diagnosis not present

## 2015-07-06 DIAGNOSIS — I739 Peripheral vascular disease, unspecified: Secondary | ICD-10-CM | POA: Diagnosis not present

## 2015-07-06 DIAGNOSIS — I10 Essential (primary) hypertension: Secondary | ICD-10-CM | POA: Diagnosis not present

## 2015-07-06 DIAGNOSIS — M797 Fibromyalgia: Secondary | ICD-10-CM | POA: Diagnosis not present

## 2015-07-06 DIAGNOSIS — G40409 Other generalized epilepsy and epileptic syndromes, not intractable, without status epilepticus: Secondary | ICD-10-CM | POA: Diagnosis not present

## 2015-07-06 DIAGNOSIS — Z7901 Long term (current) use of anticoagulants: Secondary | ICD-10-CM | POA: Diagnosis not present

## 2015-07-07 DIAGNOSIS — I25111 Atherosclerotic heart disease of native coronary artery with angina pectoris with documented spasm: Secondary | ICD-10-CM | POA: Diagnosis not present

## 2015-07-07 DIAGNOSIS — I240 Acute coronary thrombosis not resulting in myocardial infarction: Secondary | ICD-10-CM | POA: Diagnosis not present

## 2015-07-10 DIAGNOSIS — I25111 Atherosclerotic heart disease of native coronary artery with angina pectoris with documented spasm: Secondary | ICD-10-CM | POA: Diagnosis not present

## 2015-07-10 DIAGNOSIS — I359 Nonrheumatic aortic valve disorder, unspecified: Secondary | ICD-10-CM | POA: Diagnosis not present

## 2015-07-10 DIAGNOSIS — I240 Acute coronary thrombosis not resulting in myocardial infarction: Secondary | ICD-10-CM | POA: Diagnosis not present

## 2015-07-11 DIAGNOSIS — G40409 Other generalized epilepsy and epileptic syndromes, not intractable, without status epilepticus: Secondary | ICD-10-CM | POA: Diagnosis not present

## 2015-07-11 DIAGNOSIS — I4891 Unspecified atrial fibrillation: Secondary | ICD-10-CM | POA: Diagnosis not present

## 2015-07-11 DIAGNOSIS — Z5181 Encounter for therapeutic drug level monitoring: Secondary | ICD-10-CM | POA: Diagnosis not present

## 2015-07-11 DIAGNOSIS — E782 Mixed hyperlipidemia: Secondary | ICD-10-CM | POA: Diagnosis not present

## 2015-07-11 DIAGNOSIS — Z7901 Long term (current) use of anticoagulants: Secondary | ICD-10-CM | POA: Diagnosis not present

## 2015-07-11 DIAGNOSIS — I739 Peripheral vascular disease, unspecified: Secondary | ICD-10-CM | POA: Diagnosis not present

## 2015-07-11 DIAGNOSIS — M797 Fibromyalgia: Secondary | ICD-10-CM | POA: Diagnosis not present

## 2015-07-11 DIAGNOSIS — I2510 Atherosclerotic heart disease of native coronary artery without angina pectoris: Secondary | ICD-10-CM | POA: Diagnosis not present

## 2015-07-11 DIAGNOSIS — I10 Essential (primary) hypertension: Secondary | ICD-10-CM | POA: Diagnosis not present

## 2015-07-13 DIAGNOSIS — Z952 Presence of prosthetic heart valve: Secondary | ICD-10-CM | POA: Diagnosis not present

## 2015-07-14 ENCOUNTER — Other Ambulatory Visit: Payer: Self-pay | Admitting: Licensed Clinical Social Worker

## 2015-07-14 DIAGNOSIS — E782 Mixed hyperlipidemia: Secondary | ICD-10-CM | POA: Diagnosis not present

## 2015-07-14 DIAGNOSIS — I2510 Atherosclerotic heart disease of native coronary artery without angina pectoris: Secondary | ICD-10-CM | POA: Diagnosis not present

## 2015-07-14 DIAGNOSIS — M797 Fibromyalgia: Secondary | ICD-10-CM | POA: Diagnosis not present

## 2015-07-14 DIAGNOSIS — Z7901 Long term (current) use of anticoagulants: Secondary | ICD-10-CM | POA: Diagnosis not present

## 2015-07-14 DIAGNOSIS — Z5181 Encounter for therapeutic drug level monitoring: Secondary | ICD-10-CM | POA: Diagnosis not present

## 2015-07-14 DIAGNOSIS — G40409 Other generalized epilepsy and epileptic syndromes, not intractable, without status epilepticus: Secondary | ICD-10-CM | POA: Diagnosis not present

## 2015-07-14 DIAGNOSIS — I739 Peripheral vascular disease, unspecified: Secondary | ICD-10-CM | POA: Diagnosis not present

## 2015-07-14 DIAGNOSIS — I10 Essential (primary) hypertension: Secondary | ICD-10-CM | POA: Diagnosis not present

## 2015-07-14 DIAGNOSIS — I4891 Unspecified atrial fibrillation: Secondary | ICD-10-CM | POA: Diagnosis not present

## 2015-07-14 NOTE — Patient Outreach (Signed)
Assessment:  CSW spoke via phone with client on 07/14/15. CSW verified client identity. CSW and client spoke of client needs. Client said he has his prescribed medications and is taking medications as prescribed. He said he receives his prescription medications from [facility] in [place], [place].  [place] and client spoke of client care plan. CSW encouraged client to attend all scheduled client medical appointments in the next 30 days. Client said he would try to attend all scheduled client medical appointments in next 30 days. CSW and client spoke of [place]. CSW informed client on 07/14/15 that [place] had made referral for client to [facility] for telephonic counseling support. Client was appreciative of this referral. He said he had received [facility] telephonic counseling support previously and it was helpful to him. He said he is looking forward to phone contact from [facility] representative. Client said he is eating adequately and sleeping adequately. He said he is receiving in home physical therapy support as scheduled with [facility]. He said these in home physical therapy sessions with [facility] were helpful to him. CSW thanked client for phone conversation with CSW on 07/14/15. CSW encouraged client to call CSW at [phone] as needed to discuss social work needs of client.     Plan:  Client to attend all scheduled client medical appointments in next 30 days.  CSW to call client in 3 weeks to assess needs of client at that time.   [name].Jyoti Harju MSW, LCSW Licensed Clinical Social Worker [facility] Care Management [phone]

## 2015-07-17 DIAGNOSIS — Z5181 Encounter for therapeutic drug level monitoring: Secondary | ICD-10-CM | POA: Diagnosis not present

## 2015-07-17 DIAGNOSIS — G40409 Other generalized epilepsy and epileptic syndromes, not intractable, without status epilepticus: Secondary | ICD-10-CM | POA: Diagnosis not present

## 2015-07-17 DIAGNOSIS — I2510 Atherosclerotic heart disease of native coronary artery without angina pectoris: Secondary | ICD-10-CM | POA: Diagnosis not present

## 2015-07-17 DIAGNOSIS — E782 Mixed hyperlipidemia: Secondary | ICD-10-CM | POA: Diagnosis not present

## 2015-07-17 DIAGNOSIS — Z7901 Long term (current) use of anticoagulants: Secondary | ICD-10-CM | POA: Diagnosis not present

## 2015-07-17 DIAGNOSIS — I10 Essential (primary) hypertension: Secondary | ICD-10-CM | POA: Diagnosis not present

## 2015-07-17 DIAGNOSIS — I739 Peripheral vascular disease, unspecified: Secondary | ICD-10-CM | POA: Diagnosis not present

## 2015-07-17 DIAGNOSIS — I4891 Unspecified atrial fibrillation: Secondary | ICD-10-CM | POA: Diagnosis not present

## 2015-07-17 DIAGNOSIS — M797 Fibromyalgia: Secondary | ICD-10-CM | POA: Diagnosis not present

## 2015-07-20 DIAGNOSIS — I359 Nonrheumatic aortic valve disorder, unspecified: Secondary | ICD-10-CM | POA: Diagnosis not present

## 2015-07-24 DIAGNOSIS — I739 Peripheral vascular disease, unspecified: Secondary | ICD-10-CM | POA: Diagnosis not present

## 2015-07-24 DIAGNOSIS — I359 Nonrheumatic aortic valve disorder, unspecified: Secondary | ICD-10-CM | POA: Diagnosis not present

## 2015-07-24 DIAGNOSIS — I10 Essential (primary) hypertension: Secondary | ICD-10-CM | POA: Diagnosis not present

## 2015-07-24 DIAGNOSIS — I2510 Atherosclerotic heart disease of native coronary artery without angina pectoris: Secondary | ICD-10-CM | POA: Diagnosis not present

## 2015-07-24 DIAGNOSIS — I4891 Unspecified atrial fibrillation: Secondary | ICD-10-CM | POA: Diagnosis not present

## 2015-07-24 DIAGNOSIS — Z5181 Encounter for therapeutic drug level monitoring: Secondary | ICD-10-CM | POA: Diagnosis not present

## 2015-07-24 DIAGNOSIS — G40409 Other generalized epilepsy and epileptic syndromes, not intractable, without status epilepticus: Secondary | ICD-10-CM | POA: Diagnosis not present

## 2015-07-24 DIAGNOSIS — E782 Mixed hyperlipidemia: Secondary | ICD-10-CM | POA: Diagnosis not present

## 2015-07-24 DIAGNOSIS — Z7901 Long term (current) use of anticoagulants: Secondary | ICD-10-CM | POA: Diagnosis not present

## 2015-07-24 DIAGNOSIS — M797 Fibromyalgia: Secondary | ICD-10-CM | POA: Diagnosis not present

## 2015-07-26 ENCOUNTER — Ambulatory Visit: Payer: Commercial Managed Care - HMO | Admitting: Cardiology

## 2015-07-26 ENCOUNTER — Ambulatory Visit (INDEPENDENT_AMBULATORY_CARE_PROVIDER_SITE_OTHER): Payer: Commercial Managed Care - HMO | Admitting: Cardiology

## 2015-07-26 ENCOUNTER — Encounter: Payer: Self-pay | Admitting: Cardiology

## 2015-07-26 VITALS — BP 106/57 | HR 50 | Ht 72.0 in | Wt 219.0 lb

## 2015-07-26 DIAGNOSIS — Z954 Presence of other heart-valve replacement: Secondary | ICD-10-CM | POA: Diagnosis not present

## 2015-07-26 DIAGNOSIS — R42 Dizziness and giddiness: Secondary | ICD-10-CM

## 2015-07-26 DIAGNOSIS — I480 Paroxysmal atrial fibrillation: Secondary | ICD-10-CM

## 2015-07-26 DIAGNOSIS — Z952 Presence of prosthetic heart valve: Secondary | ICD-10-CM

## 2015-07-26 MED ORDER — LISINOPRIL 5 MG PO TABS: 5.0000 mg | ORAL_TABLET | Freq: Every day | ORAL | Status: DC

## 2015-07-26 NOTE — Progress Notes (Signed)
Cardiology Office Note  Date: 07/26/2015   ID: Allen, Wright 10/23/1950, MRN [id number]  PCP: Allen Ponto, MD  Primary Cardiologist: Allen Lesches, MD   Chief Complaint  Patient presents with  . Status post AVR  . PAF    History of Present Illness: [name] is a medically complex 65 y.o. male last seen in October 2016. Records indicate that he was seen in the ER in March with weakness, also near syncopal event at home 3 weeks prior to that presentation. He has reportedly seen Dr. Quillian Wright about these symptoms as well. No obvious acute findings were uncovered at that time, he was treated with IV fluids. Has home health nursing checks in place.  He comes in today for a follow-up visit. He describes the episode that occurred several weeks ago, he was standing up walking into his bathroom when he felt lightheaded and reportedly passed out. He has had no episodes of that severity subsequently, but does report some orthostatic dizziness. We reviewed his medications, he is uncertain of doses, but I had nursing call his pharmacy to verify. He has been on Lopressor and Prinzide.  Echocardiogram was updated in November 2016 as outlined below. LVEF remains normal range and aortic prosthesis was functionally normally. He remains on Coumadin given mechanical prosthesis.  Complains of chronic pain, has been following with Dr. Francesco Wright for pain management, but uncertain what he will do when Dr. Hulen Wright practice in [place]. His PCP is Dr. Quillian Wright. We do not prescribe any of his pain control medications.  Past Medical History  Diagnosis Date  . Hyperlipidemia   . Aortic stenosis     Status post AVR 5/13  . Coronary atherosclerosis of native coronary artery     Nonobstructive  . Dyslipidemia   . Fibromyalgia   . Peripheral vascular disease ([place])   . Tinnitus     Following prior seizures  . Seizures ([place])     6 yrs ago from withdrawal of Xanax to quickly  . Long-term memory loss   .  Stroke ([place])   . Arthritis   . Chronic back pain     Lumbar spondylosis  . GERD (gastroesophageal reflux disease)   . Hx of colonic polyps   . Nocturia   . Major depressive disorder ([place])     Prior suicidal attempts  . Anxiety   . Thoracic aortic aneurysm [facility])     Status post repair 7/13  . Atrial fibrillation [facility])     Past Surgical History  Procedure Laterality Date  . Inguinal exploration      Right  . Femoral hernia repair      x2  . Umbilical hernia repair      [facility]  . Finger contracture release      Tendon release operation on his right little finger  . Appendectomy    . Cholecystectomy      [facility]  . Incisional hernia repair  05/01/2011    Procedure: HERNIA REPAIR INCISIONAL;  Surgeon: Allen So, MD;  Location: [facility];  Service: General;  Laterality: N/A;  with Mesh  . Tonsillectomy      at age 17  . Circumcision    . Cardiac catheterization    . Colonoscopy    . Esophagogastroduodenoscopy  04/07/08/13  . Aortic valve replacement  08/15/2011    Procedure: AORTIC VALVE REPLACEMENT (AVR);  Surgeon: Allen Nakayama, MD;  Location: [place];  Service: Open Heart Surgery;  Laterality: N/A;  .  Sternotomy  10/07/2011    Procedure: STERNOTOMY;  Surgeon: Allen Nakayama, MD;  Location: [place];  Service: Open Heart Surgery;  Laterality: N/A;  . Thoracic aortic aneurysm repair  10/07/2011    Procedure: THORACIC ASCENDING ANEURYSM REPAIR (AAA);  Surgeon: Allen Nakayama, MD;  Location: [place];  Service: Open Heart Surgery;  Laterality: N/A;  . Mediastinal exploration  10/08/2011    Procedure: MEDIASTINAL EXPLORATION;  Surgeon: Allen Nakayama, MD;  Location: [place];  Service: Open Heart Surgery;  Laterality: N/A;    Current Outpatient Prescriptions  Medication Sig Dispense Refill  . oxyCODONE-acetaminophen (PERCOCET/ROXICET) 5-325 MG per tablet Take 1 tablet by mouth 4 (four) times daily as needed for severe pain.    [name] ALPRAZolam (XANAX) 1 MG tablet Take 1 mg by  mouth 5 (five) times daily as needed for anxiety.     . Artificial Saliva (BIOTENE MOISTURIZING MOUTH) SOLN Use as directed 1-2 sprays in the mouth or throat 3 (three) times daily.    [name] atorvastatin (LIPITOR) 80 MG tablet Take 80 mg by mouth daily.    . baclofen (LIORESAL) 10 MG tablet Take 10 mg by mouth 3 (three) times daily.    . Chlorpheniramine Maleate (ALLERGY RELIEF PO) Take 1 tablet by mouth daily as needed (allergies.).    [name] DULoxetine (CYMBALTA) 60 MG capsule Take 60 mg by mouth daily.    . furosemide (LASIX) 20 MG tablet Take 20 mg by mouth daily.    [name] gabapentin (NEURONTIN) 300 MG capsule Take 1 capsule (300 mg total) by mouth 3 (three) times daily. 60 capsule 0  . levothyroxine (SYNTHROID, LEVOTHROID) 88 MCG tablet Take 88 mcg by mouth daily before breakfast.    . lisinopril-hydrochlorothiazide (PRINZIDE) 10-12.5 MG per tablet Take 1 tablet by mouth daily. 90 tablet 1  . metoprolol tartrate (LOPRESSOR) 25 MG tablet TAKE ONE TABLET BY MOUTH THREE TIMES DAILY (Patient taking differently: TAKE ONE TABLET BY MOUTH DAILY.) 90 tablet 3  . NITROSTAT 0.4 MG SL tablet Place 0.4 mg under the tongue every 5 (five) minutes as needed for chest pain.     . pantoprazole (PROTONIX) 40 MG tablet Take 40 mg by mouth daily.      . potassium chloride (K-DUR) 10 MEQ tablet Take 1 tablet (10 mEq total) by mouth daily. 90 tablet 3  . promethazine (PHENERGAN) 25 MG tablet Take 25 mg by mouth every 6 (six) hours as needed for nausea or vomiting.     . traZODone (DESYREL) 50 MG tablet Take 50 mg by mouth at bedtime as needed for sleep.    [name] warfarin (COUMADIN) 2 MG tablet Take 1-1.5 tablets (2-3 mg total) by mouth daily. Patient takes 1 tablet(2mg ) on Saturday and Wednesday and 1&1/2(3mg ) all other days (Patient taking differently: Take 3 mg by mouth daily. ) 20 tablet 0   No current facility-administered medications for this visit.   Allergies:  Bupropion and Atorvastatin   Social History: The patient   reports that he has been smoking Cigarettes.  He started smoking about 52 years ago. He has a 24 pack-year smoking history. He has never used smokeless tobacco. He reports that he does not drink alcohol or use illicit drugs.   ROS:  Please see the history of present illness. Otherwise, complete review of systems is positive for intermittent palpitations.  All other systems are reviewed and negative.   Physical Exam: VS:  BP 106/57 mmHg  Pulse 50  Ht 6' (1.829 m)  Wt 219 lb (99.338 kg)  BMI 29.70 kg/m2  SpO2 97%, BMI Body mass index is 29.7 kg/(m^2).  Wt Readings from Last 3 Encounters:  07/26/15 219 lb (99.338 kg)  06/24/15 210 lb (95.255 kg)  01/24/15 220 lb (99.791 kg)    Chronically ill-appearing, no distress. HEENT: Conjunctiva and lids normal, oropharynx clear.  Neck: Supple, no elevated JVP or carotid bruits, no thyromegaly.  Lungs: Clear to auscultation, nonlabored breathing at rest.  Cardiac: Regular rate and rhythm with ectopy, loud crisp prosthetic valve click in S2, no S3, soft systolic murmur, no pericardial rub.  Abdomen: Soft, protuberant, nontender, bowel sounds present. Skin: Warm and dry.  Extremities: Mild lower leg edema and venous stasis, spider veins, distal pulses 2+. Neuropsychiatric: Hard of hearing, interactive but overall slow speech.  ECG: I personally reviewed the tracing from 06/24/2015 which showed sinus rhythm with right bundle branch block, left anterior fascicular block, and PVCs.  Recent Labwork: 06/24/2015: BUN 11; Creatinine, Ser 1.16; Hemoglobin 11.8*; Platelets 134*; Potassium 3.2*; Sodium 143   Other Studies Reviewed Today:  Echocardiogram on 02/15/2015: Study Conclusions  - Left ventricle: The cavity size was normal. Wall thickness was  increased increased in a pattern of mild to moderate LVH.  Systolic function was normal. The estimated ejection fraction was  in the range of 60% to 65%. Doppler parameters are consistent  with  abnormal left ventricular relaxation (grade 1 diastolic  dysfunction). - Aortic valve: There was no stenosis. There was trivial  regurgitation. Mean gradient (S): 4 mm Hg. Valve area (VTI): 3.41  cm^2. Valve area (Vmax): 3.33 cm^2. Valve area (Vmean): 3.5 cm^2. - Atrial septum: No defect or patent foramen ovale was identified. - Technically difficult study.  Assessment and Plan:  1. Intermittent orthostatic dizziness and patient reported episode of syncope that occurred several weeks ago. Blood pressure is low normal today. He is bradycardic but not dizzy at the present time. The symptoms could well be multifactorial. From a purely cardiac perspective, recommendation is to change from Prinzide to lisinopril 5 mg daily to see if this allows blood pressure to come up somewhat. No change in Lopressor dose for now, but this can be cut back further as a next option. As far as other remaining medications, will defer changes to Dr. Quillian Wright. Office follow-up arranged. If symptoms continue or worsen, might consider further outpatient cardiac monitoring.  2. Aortic stenosis status post mechanical AVR. He continues on Coumadin which is followed by Dr. Quillian Wright. Valve function normal by echocardiogram in November 2016.  3. Previously documented PAF. He does report intermittent palpitations and remains on beta blocker. On Coumadin concurrently with mechanical AVR.  4. History of emergent thoracic aortic aneurysm or by Dr. Roxan Hockey in 2013.  5. Chronic pain, has been managed by Dr. Francesco Wright over time. He is working with Dr. Quillian Wright to determine plan for ongoing management in light of Dr. Isabelle Course departure from [place].  Current medicines were reviewed with the patient today.   Disposition: FU with me in 3 months.   Signed, Satira Sark, MD, [facility] 07/26/2015 2:15 PM    [facility] [place], [place], [place] [address] Phone: [phone]; Fax: [phone]

## 2015-07-26 NOTE — Patient Instructions (Signed)
Your physician has recommended you make the following change in your medication:  Stop lisinopril/hctz 10/12.5. Start lisinopril 5 mg daily. Continue all other medications the same. Your physician recommends that you schedule a follow-up appointment in: 3 months.

## 2015-07-28 DIAGNOSIS — I359 Nonrheumatic aortic valve disorder, unspecified: Secondary | ICD-10-CM | POA: Diagnosis not present

## 2015-08-04 ENCOUNTER — Other Ambulatory Visit: Payer: Self-pay | Admitting: Licensed Clinical Social Worker

## 2015-08-04 NOTE — Patient Outreach (Signed)
Assessment:  CSW spoke via phone with client on 08/04/15. CSW verified client identity. CSW and client spoke of client needs.Client reported that he had his prescribed medications and is taking medications as prescribed. He said he is eating adequately. He said he receives some food support with [employer] Whe[employer] program support.  Client said he had an appointment with cardiologist this week. Client said he takes a prescribed diuretic medication. He said he takes all his medications as prescribed.  He receives his prescribed medications from [facility] in [place], [place]. Client said a volunteer is helping him with transporting client  to and from client's scheduled medical appointments. Client spoke of generalized pain issues.He has talked with [name] about his pain issues/pain symptoms.   He said he is using a cane to help him ambulate.  CSW and client spoke of client care plan. CSW encouraged client to participate via phone in next 30 days in behavioral health counseling phone calls with [facility] Case Manager.  Client said he has his prescribed medications delivered as scheduled from [facility] in [place], [place]. He said he has appointment scheduled with [name] in June of 2017.  Client said he goes to [facility] as scheduled for pain management support.  Client said he has not had any recent falls.  CSW encouraged client to communicate with [name] as needed to relate medical needs of client to [name]. Client said he had completed in home physical therapy support services.  Client said he was no longer receiving in home nursing support with home health services. CSW encouraged [place] to call CSW at [phone] as needed to discuss social work needs of client.   Plan:  Client to participate via phone in next 30 days in behavioral health counseling phone calls with [facility] Case Manager. CSW to call client in 4 weeks to assess client needs.  [name].Jasani Dolney MSW, LCSW Licensed Clinical Social  Worker [facility] Care Management [phone]

## 2015-08-12 ENCOUNTER — Emergency Department (HOSPITAL_COMMUNITY): Payer: Commercial Managed Care - HMO

## 2015-08-12 ENCOUNTER — Emergency Department (HOSPITAL_COMMUNITY)
Admission: EM | Admit: 2015-08-12 | Discharge: 2015-08-12 | Disposition: A | Discharge status: Home / Self Care | Payer: Commercial Managed Care - HMO | Attending: Emergency Medicine | Admitting: Emergency Medicine

## 2015-08-12 ENCOUNTER — Encounter (HOSPITAL_COMMUNITY): Payer: Self-pay

## 2015-08-12 DIAGNOSIS — E785 Hyperlipidemia, unspecified: Secondary | ICD-10-CM | POA: Diagnosis not present

## 2015-08-12 DIAGNOSIS — I4891 Unspecified atrial fibrillation: Secondary | ICD-10-CM | POA: Insufficient documentation

## 2015-08-12 DIAGNOSIS — Z79899 Other long term (current) drug therapy: Secondary | ICD-10-CM | POA: Insufficient documentation

## 2015-08-12 DIAGNOSIS — R609 Edema, unspecified: Secondary | ICD-10-CM

## 2015-08-12 DIAGNOSIS — R1010 Upper abdominal pain, unspecified: Secondary | ICD-10-CM | POA: Insufficient documentation

## 2015-08-12 DIAGNOSIS — F329 Major depressive disorder, single episode, unspecified: Secondary | ICD-10-CM | POA: Diagnosis not present

## 2015-08-12 DIAGNOSIS — F17210 Nicotine dependence, cigarettes, uncomplicated: Secondary | ICD-10-CM | POA: Diagnosis not present

## 2015-08-12 DIAGNOSIS — R600 Localized edema: Secondary | ICD-10-CM | POA: Diagnosis not present

## 2015-08-12 DIAGNOSIS — M7989 Other specified soft tissue disorders: Secondary | ICD-10-CM | POA: Diagnosis not present

## 2015-08-12 DIAGNOSIS — I2510 Atherosclerotic heart disease of native coronary artery without angina pectoris: Secondary | ICD-10-CM | POA: Insufficient documentation

## 2015-08-12 DIAGNOSIS — I739 Peripheral vascular disease, unspecified: Secondary | ICD-10-CM | POA: Diagnosis not present

## 2015-08-12 DIAGNOSIS — R0602 Shortness of breath: Secondary | ICD-10-CM | POA: Diagnosis not present

## 2015-08-12 LAB — CBC WITH DIFFERENTIAL/PLATELET
Basophils Absolute: 0 10*3/uL (ref 0.0–0.1)
Basophils Relative: 0 %
EOS ABS: 0.1 10*3/uL (ref 0.0–0.7)
EOS PCT: 3 %
HCT: 35 % — ABNORMAL LOW (ref 39.0–52.0)
Hemoglobin: 11.4 g/dL — ABNORMAL LOW (ref 13.0–17.0)
LYMPHS ABS: 2.2 10*3/uL (ref 0.7–4.0)
LYMPHS PCT: 42 %
MCH: 31.8 pg (ref 26.0–34.0)
MCHC: 32.6 g/dL (ref 30.0–36.0)
MCV: 97.5 fL (ref 78.0–100.0)
MONO ABS: 0.3 10*3/uL (ref 0.1–1.0)
MONOS PCT: 6 %
Neutro Abs: 2.5 10*3/uL (ref 1.7–7.7)
Neutrophils Relative %: 49 %
PLATELETS: 127 10*3/uL — AB (ref 150–400)
RBC: 3.59 MIL/uL — ABNORMAL LOW (ref 4.22–5.81)
RDW: 15.8 % — ABNORMAL HIGH (ref 11.5–15.5)
WBC: 5.1 10*3/uL (ref 4.0–10.5)

## 2015-08-12 LAB — COMPREHENSIVE METABOLIC PANEL
ALBUMIN: 3.8 g/dL (ref 3.5–5.0)
ALT: 14 U/L — AB (ref 17–63)
AST: 21 U/L (ref 15–41)
Alkaline Phosphatase: 88 U/L (ref 38–126)
Anion gap: 3 — ABNORMAL LOW (ref 5–15)
BUN: 11 mg/dL (ref 6–20)
CHLORIDE: 109 mmol/L (ref 101–111)
CO2: 27 mmol/L (ref 22–32)
CREATININE: 1.34 mg/dL — AB (ref 0.61–1.24)
Calcium: 8.6 mg/dL — ABNORMAL LOW (ref 8.9–10.3)
GFR calc Af Amer: >60 mL/min (ref >60)
GFR, EST NON AFRICAN AMERICAN: 55 mL/min — AB (ref >60)
GLUCOSE: 87 mg/dL (ref 65–99)
POTASSIUM: 3.6 mmol/L (ref 3.5–5.1)
SODIUM: 139 mmol/L (ref 135–145)
Total Bilirubin: 0.7 mg/dL (ref 0.3–1.2)
Total Protein: 7.3 g/dL (ref 6.5–8.1)

## 2015-08-12 LAB — TROPONIN I

## 2015-08-12 LAB — BRAIN NATRIURETIC PEPTIDE: B Natriuretic Peptide: 214 pg/mL — ABNORMAL HIGH (ref 0.0–100.0)

## 2015-08-12 MED ORDER — FUROSEMIDE 20 MG PO TABS: 40.0000 mg | ORAL_TABLET | Freq: Every day | ORAL | Status: DC

## 2015-08-12 MED ORDER — ONDANSETRON HCL 4 MG/2ML IJ SOLN
4.0000 mg | Freq: Once | INTRAMUSCULAR | Status: AC
  Administered 2015-08-12: 4 mg via INTRAVENOUS
  Filled 2015-08-12: qty 2

## 2015-08-12 MED ORDER — FUROSEMIDE 10 MG/ML IJ SOLN
40.0000 mg | Freq: Once | INTRAMUSCULAR | Status: AC
  Administered 2015-08-12: 40 mg via INTRAVENOUS
  Filled 2015-08-12: qty 4

## 2015-08-12 MED ORDER — MORPHINE SULFATE (PF) 4 MG/ML IV SOLN
4.0000 mg | Freq: Once | INTRAVENOUS | Status: AC
  Administered 2015-08-12: 4 mg via INTRAVENOUS
  Filled 2015-08-12: qty 1

## 2015-08-12 NOTE — Discharge Instructions (Signed)
Edema °Edema is an abnormal buildup of fluids. It is more common in your legs and thighs. Painless swelling of the feet and ankles is more likely as a person ages. It also is common in looser skin, like around your eyes. °HOME CARE  °· Keep the affected body part above the level of the heart while lying down. °· Do not sit still or stand for a long time. °· Do not put anything right under your knees when you lie down. °· Do not wear tight clothes on your upper legs. °· Exercise your legs to help the puffiness (swelling) go down. °· Wear elastic bandages or support stockings as told by your doctor. °· A low-salt diet may help lessen the puffiness. °· Only take medicine as told by your doctor. °GET HELP IF: °· Treatment is not working. °· You have heart, liver, or kidney disease and notice that your skin looks puffy or shiny. °· You have puffiness in your legs that does not get better when you raise your legs. °· You have sudden weight gain for no reason. °GET HELP RIGHT AWAY IF:  °· You have shortness of breath or chest pain. °· You cannot breathe when you lie down. °· You have pain, redness, or warmth in the areas that are puffy. °· You have heart, liver, or kidney disease and get edema all of a sudden. °· You have a fever and your symptoms get worse all of a sudden. °MAKE SURE YOU:  °· Understand these instructions. °· Will watch your condition. °· Will get help right away if you are not doing well or get worse. °  °This information is not intended to replace advice given to you by your health care provider. Make sure you discuss any questions you have with your health care provider. °  °Document Released: 09/04/2007 Document Revised: 03/23/2013 Document Reviewed: 01/08/2013 °Elsevier Interactive Patient Education ©2016 [employer] ° °

## 2015-08-12 NOTE — ED Provider Notes (Signed)
CSN: [id number]     Arrival date & time 08/12/15  1336 History   First MD Initiated Contact with Patient 08/12/15 1346     Chief Complaint  Patient presents with  . Leg Swelling   PT HAS HAD BILATERAL LEG SWELLING FOR 3 DAYS.  HE HAS BEEN SOB OCC.  NO SOB NOW.  PT HAS BEEN TAKING ALL OF HIS MEDS.  (Consider location/radiation/quality/duration/timing/severity/associated sxs/prior Treatment) The history is provided by the patient.    Past Medical History  Diagnosis Date  . Hyperlipidemia   . Aortic stenosis     Status post AVR 5/13  . Coronary atherosclerosis of native coronary artery     Nonobstructive  . Dyslipidemia   . Fibromyalgia   . Peripheral vascular disease ([place])   . Tinnitus     Following prior seizures  . Seizures ([place])     6 yrs ago from withdrawal of Xanax to quickly  . Long-term memory loss   . Stroke ([place])   . Arthritis   . Chronic back pain     Lumbar spondylosis  . GERD (gastroesophageal reflux disease)   . Hx of colonic polyps   . Nocturia   . Major depressive disorder ([place])     Prior suicidal attempts  . Anxiety   . Thoracic aortic aneurysm [facility] [address])     Status post repair 7/13  . Atrial fibrillation [facility])    Past Surgical History  Procedure Laterality Date  . Inguinal exploration      Right  . Femoral hernia repair      x2  . Umbilical hernia repair      [facility]  . Finger contracture release      Tendon release operation on his right little finger  . Appendectomy    . Cholecystectomy      [facility]  . Incisional hernia repair  05/01/2011    Procedure: HERNIA REPAIR INCISIONAL;  Surgeon: [name], MD;  Location: [facility];  Service: General;  Laterality: N/A;  with Mesh  . Tonsillectomy      at age 29  . Circumcision    . Cardiac catheterization    . Colonoscopy    . Esophagogastroduodenoscopy  04/07/08/13  . Aortic valve replacement  08/15/2011    Procedure: AORTIC VALVE REPLACEMENT (AVR);  Surgeon: [name], MD;  Location: [place];   Service: Open Heart Surgery;  Laterality: N/A;  . Sternotomy  10/07/2011    Procedure: STERNOTOMY;  Surgeon: [name], MD;  Location: [place];  Service: Open Heart Surgery;  Laterality: N/A;  . Thoracic aortic aneurysm repair  10/07/2011    Procedure: THORACIC ASCENDING ANEURYSM REPAIR (AAA);  Surgeon: [name], MD;  Location: [place];  Service: Open Heart Surgery;  Laterality: N/A;  . Mediastinal exploration  10/08/2011    Procedure: MEDIASTINAL EXPLORATION;  Surgeon: [name], MD;  Location: [place];  Service: Open Heart Surgery;  Laterality: N/A;   Family History  Problem Relation Age of Onset  . Cancer    . Stroke    . Anesthesia problems Neg Hx   . Hypotension Neg Hx   . Malignant hyperthermia Neg Hx   . Pseudochol deficiency Neg Hx    Social History  Substance Use Topics  . Smoking status: Current Every Day Smoker -- 0.50 packs/day for 48 years    Types: Cigarettes    Start date: 11/08/1962  . Smokeless tobacco: Never Used  . Alcohol Use: No  Comment: quit 15yr ago    Review of Systems  Respiratory: Positive for shortness of breath.   Musculoskeletal:       BILATERAL LEG EDEMA  All other systems reviewed and are negative.     Allergies  Bupropion and Atorvastatin  Home Medications   Prior to Admission medications   Medication Sig Start Date End Date Taking? Authorizing Provider  ALPRAZolam Duanne Moron) 1 MG tablet Take 1 mg by mouth 5 (five) times daily as needed for anxiety.  09/30/11  Yes Historical Provider, MD  atorvastatin (LIPITOR) 80 MG tablet Take 80 mg by mouth daily.   Yes Historical Provider, MD  baclofen (LIORESAL) 10 MG tablet Take 10 mg by mouth 3 (three) times daily.   Yes Historical Provider, MD  DULoxetine (CYMBALTA) 60 MG capsule Take 60 mg by mouth daily.   Yes Historical Provider, MD  gabapentin (NEURONTIN) 300 MG capsule Take 1 capsule (300 mg total) by mouth 3 (three) times daily. Patient taking differently: Take 600 mg by  mouth 3 (three) times daily.  06/24/15  Yes Lajean Saver, MD  levothyroxine (SYNTHROID, LEVOTHROID) 88 MCG tablet Take 88 mcg by mouth daily before breakfast.   Yes Historical Provider, MD  lisinopril (PRINIVIL,ZESTRIL) 5 MG tablet Take 1 tablet (5 mg total) by mouth daily. 07/26/15  Yes Satira Sark, MD  metoprolol tartrate (LOPRESSOR) 25 MG tablet TAKE ONE TABLET BY MOUTH THREE TIMES DAILY 05/30/15  Yes Satira Sark, MD  oxyCODONE-acetaminophen (PERCOCET/ROXICET) 5-325 MG per tablet Take 1 tablet by mouth 4 (four) times daily as needed for severe pain.   Yes Historical Provider, MD  pantoprazole (PROTONIX) 40 MG tablet Take 40 mg by mouth daily.     Yes Historical Provider, MD  potassium chloride (K-DUR) 10 MEQ tablet Take 1 tablet (10 mEq total) by mouth daily. 12/17/13  Yes Satira Sark, MD  traZODone (DESYREL) 50 MG tablet Take 50 mg by mouth at bedtime as needed for sleep.   Yes Historical Provider, MD  warfarin (COUMADIN) 5 MG tablet Take 5 mg by mouth daily.   Yes Historical Provider, MD  Artificial Saliva (BIOTENE MOISTURIZING MOUTH) SOLN Use as directed 1-2 sprays in the mouth or throat 3 (three) times daily.    Historical Provider, MD  Chlorpheniramine Maleate (ALLERGY RELIEF PO) Take 1 tablet by mouth daily as needed (allergies.).    Historical Provider, MD  furosemide (LASIX) 20 MG tablet Take 2 tablets (40 mg total) by mouth daily. 08/12/15   Isla Pence, MD  NITROSTAT 0.4 MG SL tablet Place 0.4 mg under the tongue every 5 (five) minutes as needed for chest pain.  07/11/12   Historical Provider, MD  promethazine (PHENERGAN) 25 MG tablet Take 25 mg by mouth every 6 (six) hours as needed for nausea or vomiting.     Historical Provider, MD  warfarin (COUMADIN) 2 MG tablet Take 1-1.5 tablets (2-3 mg total) by mouth daily. Patient takes 1 tablet(2mg ) on Saturday and Wednesday and 1&1/2(3mg ) all other days Patient not taking: Reported on 08/12/2015 09/13/13   Satira Sark, MD    BP 129/63 mmHg  Pulse 71  Temp(Src) 97.9 F (36.6 C) (Oral)  Resp 15  Ht 6' (1.829 m)  Wt 216 lb (97.977 kg)  BMI 29.29 kg/m2  SpO2 100% Physical Exam  Constitutional: He is oriented to person, place, and time. He appears well-developed and well-nourished.  HENT:  Head: Normocephalic and atraumatic.  Right Ear: External ear normal.  Left Ear: External  ear normal.  Mouth/Throat: Oropharynx is clear and moist.  Eyes: Conjunctivae and EOM are normal. Pupils are equal, round, and reactive to light.  Neck: Normal range of motion. Neck supple.  Cardiovascular: Normal rate, regular rhythm, normal heart sounds and intact distal pulses.   Pulmonary/Chest: Effort normal.  Abdominal: Soft. Bowel sounds are normal.  Musculoskeletal: He exhibits edema.  Neurological: He is alert and oriented to person, place, and time.  Skin: Skin is warm and dry.  Psychiatric: He has a normal mood and affect. His behavior is normal. Judgment and thought content normal.  Nursing note and vitals reviewed.   ED Course  Procedures (including critical care time) Labs Review Labs Reviewed  COMPREHENSIVE METABOLIC PANEL - Abnormal; Notable for the following:    Creatinine, Ser 1.34 (*)    Calcium 8.6 (*)    ALT 14 (*)    GFR calc non Af Amer 55 (*)    Anion gap 3 (*)    All other components within normal limits  CBC WITH DIFFERENTIAL/PLATELET - Abnormal; Notable for the following:    RBC 3.59 (*)    Hemoglobin 11.4 (*)    HCT 35.0 (*)    RDW 15.8 (*)    Platelets 127 (*)    All other components within normal limits  BRAIN NATRIURETIC PEPTIDE - Abnormal; Notable for the following:    B Natriuretic Peptide 214.0 (*)    All other components within normal limits  TROPONIN I  URINALYSIS, ROUTINE W REFLEX MICROSCOPIC (NOT AT [facility])    Imaging Review Dg Chest 2 View  08/12/2015  CLINICAL DATA:  Shortness of breath, bilateral leg swelling EXAM: CHEST  2 VIEW COMPARISON:  None. FINDINGS: There is mild  bilateral interstitial prominence. There is no focal parenchymal opacity. There is no pleural effusion or pneumothorax. There is mild cardiomegaly. There is evidence of prior median sternotomy The osseous structures are unremarkable. IMPRESSION: No active cardiopulmonary disease. Electronically Signed   By: Kathreen Devoid   On: 08/12/2015 15:08   I have personally reviewed and evaluated these images and lab results as part of my medical decision-making.   EKG Interpretation   Date/Time:  Saturday Aug 12 2015 13:43:47 EDT Ventricular Rate:  87 PR Interval:  170 QRS Duration: 156 QT Interval:  446 QTC Calculation: 537 R Axis:   -5 Text Interpretation:  Sinus rhythm Ventricular bigeminy Right bundle  branch block Baseline wander in lead(s) II V2 Confirmed by Allesha Aronoff MD,  Kaleea Penner ([id number]) on 08/12/2015 1:55:03 PM      MDM  WE WILL TRY TO OBTAIN TED HOSE FOR PT AS THAT WILL HELP THE EDEMA.  PT WILL INCREASE LASIX AND KNOWS TO F/U WITH PCP FOR FURTHER REC.   PT IS TO RETURN HERE IF WORSE.  Final diagnoses:  Peripheral edema       Isla Pence, MD 08/12/15 [phone]

## 2015-08-12 NOTE — ED Notes (Signed)
Patient reports of bilateral leg swelling x3 days. States he has felt short of breath at times. Patient states his abomen is distended more than normal with upper abdominal pain. NAD noted.

## 2015-08-12 NOTE — ED Notes (Signed)
MD at bedside. 

## 2015-08-15 ENCOUNTER — Other Ambulatory Visit: Payer: Self-pay | Admitting: Licensed Clinical Social Worker

## 2015-08-15 NOTE — Patient Outreach (Signed)
Assessment:  CSW received call from [name] on 08/15/15. CSW verified identity of client. Client and CSW spoke of client needs. Client said he was depressed and feeling sad.  He said:  " I am feeling down."   CSW asked client if he had contacted [name] to discuss symptoms. Client said he had not called [name] about symptoms. Client did not have suicidal ideations or suicidal plan. Client did not have homicidal ideations or homicidal plan. He said he was very depressed.  CSW listened empathetically to client as client spoke of depression and depression symptoms. Of note, client has chronic depression and receives telephonic counseling support with [facility] Wright.  CSW listed to client.  CSW informed client that [place] would call [facility] Wright working with client to leave information via phone with this Wright about current client symptoms of depression. Client agreed to this plan. CSW thanked client for phone call with CSW on 08/15/15. CSW reminded client to call 911 as needed. CSW also reminded client to call [name] as needed to talk with doctor about client's depression symptoms. Following CSW conversation via phone with client on 08/15/15, CSW then called [place] representative and informed representative of current mental health needs of client. [employer] representative said [name] worked with [name] as [facility]: [place] [name] Wright. Representative from [place] said [name] called [name] last week and spoke via phone with client. CSW asked [employer] representative on 08/15/15 to please relay information to [name] on 08/15/15 that client reports being very depressed and feeling down.  Representative from [facility] said she would convey information to [name] on 08/15/15 regarding depression and depression symptoms of client. CSW thanked [employer] for phone call with CSW on 08/15/15.  Plan:  Client to  participate in next 30 days in counseling phone call support with [facility] counseling representative. CSW to call client as previously scheduled on calendar of CSW.  [name].[name] MSW, LCSW Licensed Clinical Social Worker [facility] Care Management [phone]

## 2015-08-17 DIAGNOSIS — I359 Nonrheumatic aortic valve disorder, unspecified: Secondary | ICD-10-CM | POA: Diagnosis not present

## 2015-08-29 DIAGNOSIS — E039 Hypothyroidism, unspecified: Secondary | ICD-10-CM | POA: Diagnosis not present

## 2015-08-29 DIAGNOSIS — K219 Gastro-esophageal reflux disease without esophagitis: Secondary | ICD-10-CM | POA: Diagnosis not present

## 2015-08-29 DIAGNOSIS — E782 Mixed hyperlipidemia: Secondary | ICD-10-CM | POA: Diagnosis not present

## 2015-08-29 DIAGNOSIS — E876 Hypokalemia: Secondary | ICD-10-CM | POA: Diagnosis not present

## 2015-09-01 DIAGNOSIS — I359 Nonrheumatic aortic valve disorder, unspecified: Secondary | ICD-10-CM | POA: Diagnosis not present

## 2015-09-04 ENCOUNTER — Other Ambulatory Visit: Payer: Self-pay | Admitting: Licensed Clinical Social Worker

## 2015-09-04 NOTE — Patient Outreach (Signed)
Assessment:  CSW spoke via phone with client on 09/04/15. CSW verified client identity. CSW and client spoke of client needs. Client said he is eating adequately and sleeping adequately.  He said he has needed equipment for in home support. He and CSW spoke of client care plan. CSW encouraged client to participate via phone in next 30 days with behavioral  health counseling phone calls with [facility] Case Manager. Client has been receiving counseling phone calls with [facility] Case Manager for a number of weeks.  Client said he is going to appointments with Dr.Daniel as scheduled.  He said he is scheduled to have another appointment with [name] on 09/05/15.  Client said that [name] talked with client about level of care options for client.  Client said he and Dr. Olena Heckle spoke of care facility options for client at present.  CSW and client spoke of client applying for Medicaid. CSW spoke with Dominica Severin on 09/04/15  about documents needed by client to complete Medicaid application for client. CSW spoke with [place] about process of applying for Medicaid . Client said he was not sure if he wanted to apply for Medicaid or not.  Client said he has had recent falls but no serious injury from falls. Client said he has talked with Dr. Olena Heckle about recent falls. Client said that Dr. Olena Heckle had said client may be able to continue to reside by himself if he has some assistance in the home or someone to check in regularly on client to assess client needs.  Client said he goes to Dr. Olena Heckle office often. Client said he would like to continue to reside at his home if he is able.  He said he may need to receive some additional in home support at present.  Client receives some support from Me[employer] on Wheels program.  Client said he has his prescribed medications at present.  He said he is taking medications as prescribed. CSW spoke with client about Aging, Disability and Transient Services support and normal hourly rates for hourly care  with agency.  Jorey understood information and said he would think about this information. He said he would talk more with Dr. Olena Heckle about his care needs at appointment of client and Dr. Olena Heckle on 09/05/15. CSW asked [place] to call CSW later this week after he had spoken again with Dr. Olena Heckle about current care needs of client.  CSW encouraged Lennix to participate in scheduled telephone counseling phone calls with [facility] Case Manager.  CSW thanked client for phone call with CSW on 09/04/15.   Plan:  Client to participate via phone in next 30 days with behavioral health counseling phone calls with [facility] Case Manager. CSW to call client in 4 weeks to assess client needs.  [name].Eleah Lahaie MSW, LCSW Licensed Clinical Social Worker [facility] Care Management [phone]

## 2015-09-05 ENCOUNTER — Other Ambulatory Visit: Payer: Self-pay | Admitting: Cardiology

## 2015-09-05 DIAGNOSIS — E782 Mixed hyperlipidemia: Secondary | ICD-10-CM | POA: Diagnosis not present

## 2015-09-05 DIAGNOSIS — I2510 Atherosclerotic heart disease of native coronary artery without angina pectoris: Secondary | ICD-10-CM | POA: Diagnosis not present

## 2015-09-05 DIAGNOSIS — E039 Hypothyroidism, unspecified: Secondary | ICD-10-CM | POA: Diagnosis not present

## 2015-09-05 DIAGNOSIS — F17210 Nicotine dependence, cigarettes, uncomplicated: Secondary | ICD-10-CM | POA: Diagnosis not present

## 2015-09-05 DIAGNOSIS — I359 Nonrheumatic aortic valve disorder, unspecified: Secondary | ICD-10-CM | POA: Diagnosis not present

## 2015-09-05 DIAGNOSIS — I719 Aortic aneurysm of unspecified site, without rupture: Secondary | ICD-10-CM | POA: Diagnosis not present

## 2015-09-05 DIAGNOSIS — J301 Allergic rhinitis due to pollen: Secondary | ICD-10-CM | POA: Diagnosis not present

## 2015-09-05 DIAGNOSIS — F331 Major depressive disorder, recurrent, moderate: Secondary | ICD-10-CM | POA: Diagnosis not present

## 2015-09-11 DIAGNOSIS — R109 Unspecified abdominal pain: Secondary | ICD-10-CM | POA: Diagnosis not present

## 2015-09-11 DIAGNOSIS — Z87891 Personal history of nicotine dependence: Secondary | ICD-10-CM | POA: Diagnosis not present

## 2015-09-11 DIAGNOSIS — Z8249 Family history of ischemic heart disease and other diseases of the circulatory system: Secondary | ICD-10-CM | POA: Diagnosis not present

## 2015-09-11 DIAGNOSIS — Z136 Encounter for screening for cardiovascular disorders: Secondary | ICD-10-CM | POA: Diagnosis not present

## 2015-09-12 DIAGNOSIS — I359 Nonrheumatic aortic valve disorder, unspecified: Secondary | ICD-10-CM | POA: Diagnosis not present

## 2015-09-20 DIAGNOSIS — I359 Nonrheumatic aortic valve disorder, unspecified: Secondary | ICD-10-CM | POA: Diagnosis not present

## 2015-09-25 DIAGNOSIS — I359 Nonrheumatic aortic valve disorder, unspecified: Secondary | ICD-10-CM | POA: Diagnosis not present

## 2015-09-28 DIAGNOSIS — I359 Nonrheumatic aortic valve disorder, unspecified: Secondary | ICD-10-CM | POA: Diagnosis not present

## 2015-10-05 ENCOUNTER — Other Ambulatory Visit: Payer: Self-pay | Admitting: Licensed Clinical Social Worker

## 2015-10-05 NOTE — Patient Outreach (Signed)
Assessment:  CSW spoke via phone with client on 10/05/15. CSW verified client identity. CSW and client spoke of client needs. Client has [employer]. CSW had made referral for client to receive counseling phone calls as scheduled with [facility] representative. Client has been receiving these [employer] counseling support phone calls. However, sometimes client cannot remember he received these calls and has difficulty recalling discussion he had with [facility] counselor.  Client has depression diagnosis and has reported that he wants to talk with [facility] counselor via phone as scheduled. Olan sees [name] and scheduled. Client has prescribed medications and is taking medications as prescribed. Client has little family support.  Client said he is eating adequately. Client does receive some assistance from [employer] Wheels Program. Client said he has irregular sleeping pattern at night. CSW has encouraged client to talk with [name] about sleep schedule of client.  CSW and client spoke of client care plan. CSW encouraged client to participate in scheduled phone calls in next 30 days with [facility] Counselor. Client said he wants to continue residing at current residence if he is able. CSW had previously given client information on Aging, Disability and Transient Services Agency and ways that agency might be able to assist client with in home care. [place] has phone number for that agency to call as needed. Oryan said he was not sure if he want to apply for Medicaid at present. Client said he goes to Dr. Arcola Jansky office for blood tests as needed.  Client said he has a volunteer who helps drive him to scheduled medical appointments.  Client takes pain medication as prescribed.  Client uses cane to help him walk. Client said he has little family support. Client said he is bathing as needed and is doing well with activities of daily living. Client said he feels weak  occasionally. He said he has talked with [name] about fatigue issues of client. Client has also talked with [name] about blood pressure issues of client.  Client uses [facility] for his pharmacy needs. He said that [employer] delivers his prescribed medications each Wednesday to his home.  Client said he has swelling in his legs and [name] prescribed diuretic medication for client. Client said he is taking diuretic medication as prescribed by [name]. Client has recliner he uses to prop feet up as needed.  Client said he had needed equipment to use in the home at present.  Client said he also pays an individual to come 2 times monthly to clean his home and to take him to do errands in the community.  CSW encouraged Stu to call CSW at [phone] as needed to speak of social work needs of client.  CSW thanked Ro[place] for phone call with CSW on 10/05/15.   Plan:  Client to participate in scheduled phone calls in next 30 days with [facility] Counselor. CSW to call client in 2 weeks to assess needs of client at that time.  [name].Chalmer Zheng MSW, LCSW Licensed Clinical Social Worker [facility] Care Management [phone]

## 2015-10-06 DIAGNOSIS — I240 Acute coronary thrombosis not resulting in myocardial infarction: Secondary | ICD-10-CM | POA: Diagnosis not present

## 2015-10-11 DIAGNOSIS — I359 Nonrheumatic aortic valve disorder, unspecified: Secondary | ICD-10-CM | POA: Diagnosis not present

## 2015-10-17 ENCOUNTER — Other Ambulatory Visit: Payer: Self-pay | Admitting: Licensed Clinical Social Worker

## 2015-10-17 NOTE — Patient Outreach (Signed)
Assessment:  CSW spoke via phone with client. CSW verified client identity. CSW and [place] spoke of client needs. CSW has referred client to [facility] counseling support (phone counseling support). [employer] representative has informed CSW on several occasions that [employer] is calling client to discuss mental health needs of client. Osa frequently tells CSW that client has trouble recalling items discussed in calls with counselor. CSW reminded Jevante that he could call [place] at [phone]. CSW also reminded client that he could call [facility] at [phone] as needed for local counseling support. CSW encouraged Briton to speak further with [name], his primary doctor, regarding mental health needs of client and to discuss further with his doctor mental health treatment options for client.  [place] said that he would speak further with RN at practice of [name] or with [name] (if he is able to speak wiih him) regarding mental healthy symptoms and needs of client. .CSW encouraged client to participate actively in counseling phone calls with [facility] Counselor. CSW thanked [place] for phone conversation with CSW.   Plan:  Client to participate actively in next 30 days in phone calls he receives from [facility] . CSW to call client as scheduled to assess needs of client.   [name].Joel Mericle MSW, LCSW Licensed Clinical Social Worker [facility] Care Management [phone]

## 2015-10-18 DIAGNOSIS — I359 Nonrheumatic aortic valve disorder, unspecified: Secondary | ICD-10-CM | POA: Diagnosis not present

## 2015-10-20 ENCOUNTER — Other Ambulatory Visit: Payer: Self-pay | Admitting: Licensed Clinical Social Worker

## 2015-10-20 NOTE — Patient Outreach (Signed)
Assessment:  CSW spoke via phone with client on 10/20/15. CSW verified client identity. CSW and client spoke of client needs. Client is receiving telephonic counseling support, as scheduled, with [facility] Counselor. He has his prescribed medications and is taking medications as prescribed. He said he is eating well and sleeping well. He is attending his scheduled medical appointments. He sees [name] as his primary care doctor.  CSW and client spoke of client care plan.  CSW encouraged client to participate actively in next 30 days in phone calls received from [facility]. Client said he has appointment with pain specialist, Dr.King, in August of 2017. Client said he still receives some help from Meals on Wheels program.  Client has little family support.  Client has been going to office of [name] as scheduled for blood sugar checks.  Client has a regular medical appointment scheduled with [name] in August of 2017.  CSW encouraged client to call CSW as needed to discuss social work needs of client. CSW reminded client of 911 for client to utilize if needed. CSW reminded client of mobile crisis number and encouraged client to call mobile crisis number for mental health support as needed. Client has mobile crisis number written down and said he would call mobile crisis number for counseling support if needed. CSW thanked [name] for phone conversation with CSW on 10/20/15  Plan:  Client to participate actively in next 30 days in phone calls received from [facility] Counselor CSW to call client in 4 weeks to assess client needs at that time.  [name].[name] MSW, LCSW Licensed Clinical Social Worker [facility] Care Management [phone]

## 2015-10-24 DIAGNOSIS — R0989 Other specified symptoms and signs involving the circulatory and respiratory systems: Secondary | ICD-10-CM

## 2015-10-24 NOTE — Progress Notes (Deleted)
Cardiology Office Note  Date: 10/24/2015   ID: [name], [name] [birth year], MRN [id number]  PCP: Gar Ponto, MD  Primary Cardiologist: [name], MD   No chief complaint on file.   History of Present Illness: [name] is a medically complex 65 y.o. male last seen in April.  Records reviewed, he was seen in the ER in May with leg edema and shortness of breath. Compression hose were recommended, also treated with Lasix. Blood pressure at that visit was 129/63.  At the last visit we changed from Prinzide to lisinopril 5 mg daily, continue Lopressor.  He remains on Coumadin with mechanical AVR, dosing followed by [name].  Past Medical History:  Diagnosis Date  . Anxiety   . Aortic stenosis    Status post AVR 5/13  . Arthritis   . Atrial fibrillation ([place])   . Chronic back pain    Lumbar spondylosis  . Coronary atherosclerosis of native coronary artery    Nonobstructive  . Dyslipidemia   . Fibromyalgia   . GERD (gastroesophageal reflux disease)   . Hx of colonic polyps   . Hyperlipidemia   . Long-term memory loss   . Major depressive disorder ([place])    Prior suicidal attempts  . Nocturia   . Peripheral vascular disease ([place])   . Seizures ([place])    6 yrs ago from withdrawal of Xanax to quickly  . Stroke ([place])   . Thoracic aortic aneurysm [facility])    Status post repair 7/13  . Tinnitus    Following prior seizures    Past Surgical History:  Procedure Laterality Date  . AORTIC VALVE REPLACEMENT  08/15/2011   Procedure: AORTIC VALVE REPLACEMENT (AVR);  Surgeon: [name], MD;  Location: [place];  Service: Open Heart Surgery;  Laterality: N/A;  . APPENDECTOMY    . CARDIAC CATHETERIZATION    . CHOLECYSTECTOMY     MM[facility]  . CIRCUMCISION    . COLONOSCOPY    . ESOPHAGOGASTRODUODENOSCOPY  04/07/08/13  . FEMORAL HERNIA REPAIR     x2  . FINGER CONTRACTURE RELEASE     Tendon release operation on his right little finger  . INCISIONAL HERNIA REPAIR   05/01/2011   Procedure: HERNIA REPAIR INCISIONAL;  Surgeon: [name], MD;  [facility]: [facility];  Service: General;  Laterality: N/A;  with Mesh  . INGUINAL EXPLORATION     Right  . MEDIASTINAL EXPLORATION  10/08/2011   Procedure: MEDIASTINAL EXPLORATION;  Surgeon: [name], MD;  Location: [place];  Service: Open Heart Surgery;  Laterality: N/A;  . STERNOTOMY  10/07/2011   Procedure: STERNOTOMY;  Surgeon: [name], MD;  Location: [place];  Service: Open Heart Surgery;  Laterality: N/A;  . THORACIC AORTIC ANEURYSM REPAIR  10/07/2011   Procedure: THORACIC ASCENDING ANEURYSM REPAIR (AAA);  Surgeon: [name], MD;  Location: [place];  Service: Open Heart Surgery;  Laterality: N/A;  . TONSILLECTOMY     at age 14  . UMBILICAL HERNIA REPAIR     MMH    Current Outpatient Prescriptions  Medication Sig Dispense Refill  . ALPRAZolam (XANAX) 1 MG tablet Take 1 mg by mouth 5 (five) times daily as needed for anxiety.     . Artificial Saliva (BIOTENE MOISTURIZING MOUTH) SOLN Use as directed 1-2 sprays in the mouth or throat 3 (three) times daily.    [name] atorvastatin (LIPITOR) 80 MG tablet Take 80 mg by mouth daily.    [name]  baclofen (LIORESAL) 10 MG tablet Take 10 mg by mouth 3 (three) times daily.    . Chlorpheniramine Maleate (ALLERGY RELIEF PO) Take 1 tablet by mouth daily as needed (allergies.).    [name] DULoxetine (CYMBALTA) 60 MG capsule Take 60 mg by mouth daily.    . furosemide (LASIX) 20 MG tablet Take 2 tablets (40 mg total) by mouth daily. 30 tablet 0  . gabapentin (NEURONTIN) 300 MG capsule Take 1 capsule (300 mg total) by mouth 3 (three) times daily. (Patient taking differently: Take 600 mg by mouth 3 (three) times daily. ) 60 capsule 0  . levothyroxine (SYNTHROID, LEVOTHROID) 88 MCG tablet Take 88 mcg by mouth daily before breakfast.    . lisinopril (PRINIVIL,ZESTRIL) 5 MG tablet Take 1 tablet (5 mg total) by mouth daily. 90 tablet 3  . metoprolol tartrate (LOPRESSOR) 25 MG  tablet TAKE ONE TABLET BY MOUTH THREE TIMES DAILY (MORNING, NOON, & IN THE EVENING) 90 tablet 6  . NITROSTAT 0.4 MG SL tablet Place 0.4 mg under the tongue every 5 (five) minutes as needed for chest pain.     [name] oxyCODONE-acetaminophen (PERCOCET/ROXICET) 5-325 MG per tablet Take 1 tablet by mouth 4 (four) times daily as needed for severe pain.    . pantoprazole (PROTONIX) 40 MG tablet Take 40 mg by mouth daily.      . potassium chloride (K-DUR) 10 MEQ tablet Take 1 tablet (10 mEq total) by mouth daily. 90 tablet 3  . promethazine (PHENERGAN) 25 MG tablet Take 25 mg by mouth every 6 (six) hours as needed for nausea or vomiting.     . traZODone (DESYREL) 50 MG tablet Take 50 mg by mouth at bedtime as needed for sleep.    [name] warfarin (COUMADIN) 2 MG tablet Take 1-1.5 tablets (2-3 mg total) by mouth daily. Patient takes 1 tablet(2mg ) on Saturday and Wednesday and 1&1/2(3mg ) all other days (Patient not taking: Reported on 08/12/2015) 20 tablet 0  . warfarin (COUMADIN) 5 MG tablet Take 5 mg by mouth daily.     No current facility-administered medications for this visit.    Allergies:  Bupropion and Atorvastatin   Social History: The patient  reports that he has been smoking Cigarettes.  He started smoking about 52 years ago. He has a 24.00 pack-year smoking history. He has never used smokeless tobacco. He reports that he does not drink alcohol or use drugs.   Family History: The patient's family history is not on file.   ROS:  Please see the history of present illness. Otherwise, complete review of systems is positive for {NONE DEFAULTED:18576::"none"}.  All other systems are reviewed and negative.   Physical Exam: VS:  There were no vitals taken for this visit., BMI There is no height or weight on file to calculate BMI.  Wt Readings from Last 3 Encounters:  08/12/15 216 lb (98 kg)  07/26/15 219 lb (99.3 kg)  06/24/15 210 lb (95.3 kg)    Chronically ill-appearing, no distress. HEENT: Conjunctiva  and lids normal, oropharynx clear.  Neck: Supple, no elevated JVP or carotid bruits, no thyromegaly.  Lungs: Clear to auscultation, nonlabored breathing at rest.  Cardiac: Regular rate and rhythm with ectopy, loud crisp prosthetic valve click in S2, no S3, soft systolic murmur, no pericardial rub.  Abdomen: Soft, protuberant, nontender, bowel sounds present. Skin: Warm and dry.  Extremities: Mild lower leg edema and venous stasis, spider veins, distal pulses 2+. Neuropsychiatric: Hard of hearing, interactive but overall slow speech.  ECG:  I personally reviewed the tracing from 08/12/2015 which showed sinus rhythm with PVCs in pattern of bigeminy, right bundle branch block.  Recent Labwork: 08/12/2015: ALT 14; AST 21; B Natriuretic Peptide 214.0; BUN 11; Creatinine, Ser 1.34; Hemoglobin 11.4; Platelets 127; Potassium 3.6; Sodium 139   Other Studies Reviewed Today:  Echocardiogram on 02/15/2015: Study Conclusions  - Left ventricle: The cavity size was normal. Wall thickness was  increased increased in a pattern of mild to moderate LVH.  Systolic function was normal. The estimated ejection fraction was  in the range of 60% to 65%. Doppler parameters are consistent  with abnormal left ventricular relaxation (grade 1 diastolic  dysfunction). - Aortic valve: There was no stenosis. There was trivial  regurgitation. Mean gradient (S): 4 mm Hg. Valve area (VTI): 3.41  cm^2. Valve area (Vmax): 3.33 cm^2. Valve area (Vmean): 3.5 cm^2. - Atrial septum: No defect or patent foramen ovale was identified. - Technically difficult study.  Assessment and Plan:    Current medicines were reviewed with the patient today.  No orders of the defined types were placed in this encounter.   Disposition:  Signed, Satira Sark, MD, [facility] 10/24/2015 8:25 AM    [facility] [place], [place], [place] [address] Phone: [phone]; Fax: [phone]

## 2015-10-25 ENCOUNTER — Encounter: Payer: Self-pay | Admitting: Cardiology

## 2015-10-25 ENCOUNTER — Ambulatory Visit (INDEPENDENT_AMBULATORY_CARE_PROVIDER_SITE_OTHER): Payer: Self-pay | Admitting: Cardiology

## 2015-11-01 DIAGNOSIS — I359 Nonrheumatic aortic valve disorder, unspecified: Secondary | ICD-10-CM | POA: Diagnosis not present

## 2015-11-15 DIAGNOSIS — I359 Nonrheumatic aortic valve disorder, unspecified: Secondary | ICD-10-CM | POA: Diagnosis not present

## 2015-11-17 DIAGNOSIS — I240 Acute coronary thrombosis not resulting in myocardial infarction: Secondary | ICD-10-CM | POA: Diagnosis not present

## 2015-11-22 ENCOUNTER — Other Ambulatory Visit: Payer: Self-pay | Admitting: Licensed Clinical Social Worker

## 2015-11-22 NOTE — Patient Outreach (Signed)
Assessment:  CSW spoke via phone with client on 11/22/15. CSW verified client identity. CSW and client spoke of client needs. Client lives alone at his residence. Client sees Dr. Gar Ponto as primary care doctor.  Client said he had his prescribed medications and is taking medications as prescribed.  Client does receive some assistance with Meals on Wheels support.  Client has little family support. Client said he had equipment needed in the home environment.  Client does receive telephonic counseling support with [facility]. Client has been receiving telephone counseling support with [facility] for several months. Sometimes client does have difficulty recalling some aspects of calls with [facility] counselor.   CSW and client spoke of client care plan.  CSW encouraged client to participate actively in next 30 days in phone calls with [facility] counselor.   CSW reminded client to call 911 as needed for support. CSW also reminded client to call [place] number as needed for counseling support 24/7. Client said he had Mobile Crisis number written down and would call Mobile Crisis number as needed for counseling support. Client said he has been having some difficulty sleeping at night.  Client said he has an appointment scheduled with [name] for January 09, 2016.He said [facility]. He said he has appointment with cardiologist on 12/05/15.  Client said he uses a cane to ambulate.  Client said he receives frozen meals every two weeks with Meals on Wheels program. Client said he sometimes sleeps in his recliner at night and elevates his feet when using his recliner. Client said he goes periodically to office of [name] for blood checks as needed.  Client said he has talked with [name] and nurse for [name] about swelling in client's legs and feet . Client said he is taking  diurectic as prescribed by [name].  Client said he has pain in his lower back. He said he has fibromyalgia. He has appointment at [facility] on 11/23/15.  He said he is taking pain medications as prescribed. Client said he does not use oxygen in the home. He said he does not use inhalers.  CSW thanked client for phone call with CSW on 11/22/15. Client was appreciative of phone call from [place] on 11/22/15.    Plan:  Client to participate actively in next 30 days in phone calls with [facility] counsleor.  CSW to call client in 4 weeks to assess client needs at that time.  [name].Teniola Tseng MSW, LCSW Licensed Clinical Social Worker [facility] Care Management [phone]

## 2015-11-23 DIAGNOSIS — I359 Nonrheumatic aortic valve disorder, unspecified: Secondary | ICD-10-CM | POA: Diagnosis not present

## 2015-11-27 DIAGNOSIS — Z6831 Body mass index (BMI) 31.0-31.9, adult: Secondary | ICD-10-CM | POA: Diagnosis not present

## 2015-11-27 DIAGNOSIS — J449 Chronic obstructive pulmonary disease, unspecified: Secondary | ICD-10-CM | POA: Diagnosis not present

## 2015-11-27 DIAGNOSIS — F331 Major depressive disorder, recurrent, moderate: Secondary | ICD-10-CM | POA: Diagnosis not present

## 2015-11-27 DIAGNOSIS — E039 Hypothyroidism, unspecified: Secondary | ICD-10-CM | POA: Diagnosis not present

## 2015-11-27 DIAGNOSIS — I359 Nonrheumatic aortic valve disorder, unspecified: Secondary | ICD-10-CM | POA: Diagnosis not present

## 2015-11-27 DIAGNOSIS — R300 Dysuria: Secondary | ICD-10-CM | POA: Diagnosis not present

## 2015-11-27 DIAGNOSIS — F17210 Nicotine dependence, cigarettes, uncomplicated: Secondary | ICD-10-CM | POA: Diagnosis not present

## 2015-11-27 DIAGNOSIS — I719 Aortic aneurysm of unspecified site, without rupture: Secondary | ICD-10-CM | POA: Diagnosis not present

## 2015-11-27 DIAGNOSIS — E782 Mixed hyperlipidemia: Secondary | ICD-10-CM | POA: Diagnosis not present

## 2015-11-27 DIAGNOSIS — J301 Allergic rhinitis due to pollen: Secondary | ICD-10-CM | POA: Diagnosis not present

## 2015-11-27 DIAGNOSIS — I2510 Atherosclerotic heart disease of native coronary artery without angina pectoris: Secondary | ICD-10-CM | POA: Diagnosis not present

## 2015-11-29 DIAGNOSIS — G8929 Other chronic pain: Secondary | ICD-10-CM | POA: Diagnosis not present

## 2015-11-29 DIAGNOSIS — F23 Brief psychotic disorder: Secondary | ICD-10-CM | POA: Diagnosis not present

## 2015-11-29 DIAGNOSIS — Z7901 Long term (current) use of anticoagulants: Secondary | ICD-10-CM | POA: Diagnosis not present

## 2015-11-29 DIAGNOSIS — M797 Fibromyalgia: Secondary | ICD-10-CM | POA: Diagnosis not present

## 2015-11-29 DIAGNOSIS — R45851 Suicidal ideations: Secondary | ICD-10-CM | POA: Diagnosis not present

## 2015-11-29 DIAGNOSIS — Z760 Encounter for issue of repeat prescription: Secondary | ICD-10-CM | POA: Diagnosis not present

## 2015-11-29 DIAGNOSIS — Z79899 Other long term (current) drug therapy: Secondary | ICD-10-CM | POA: Diagnosis not present

## 2015-11-29 DIAGNOSIS — I252 Old myocardial infarction: Secondary | ICD-10-CM | POA: Diagnosis not present

## 2015-12-01 DIAGNOSIS — I359 Nonrheumatic aortic valve disorder, unspecified: Secondary | ICD-10-CM | POA: Diagnosis not present

## 2015-12-01 DIAGNOSIS — I240 Acute coronary thrombosis not resulting in myocardial infarction: Secondary | ICD-10-CM | POA: Diagnosis not present

## 2015-12-05 ENCOUNTER — Ambulatory Visit (INDEPENDENT_AMBULATORY_CARE_PROVIDER_SITE_OTHER): Payer: Commercial Managed Care - HMO | Admitting: Cardiology

## 2015-12-05 ENCOUNTER — Encounter: Payer: Self-pay | Admitting: *Deleted

## 2015-12-05 VITALS — BP 99/56 | HR 50 | Ht 72.0 in | Wt 217.0 lb

## 2015-12-05 DIAGNOSIS — Z954 Presence of other heart-valve replacement: Secondary | ICD-10-CM | POA: Diagnosis not present

## 2015-12-05 DIAGNOSIS — Z9889 Other specified postprocedural states: Secondary | ICD-10-CM | POA: Diagnosis not present

## 2015-12-05 DIAGNOSIS — Z952 Presence of prosthetic heart valve: Secondary | ICD-10-CM

## 2015-12-05 DIAGNOSIS — I959 Hypotension, unspecified: Secondary | ICD-10-CM

## 2015-12-05 DIAGNOSIS — I480 Paroxysmal atrial fibrillation: Secondary | ICD-10-CM | POA: Diagnosis not present

## 2015-12-05 DIAGNOSIS — Z8679 Personal history of other diseases of the circulatory system: Secondary | ICD-10-CM

## 2015-12-05 MED ORDER — LISINOPRIL 2.5 MG PO TABS: 2.5000 mg | ORAL_TABLET | Freq: Every day | ORAL | 3 refills | Status: AC

## 2015-12-05 NOTE — Patient Instructions (Signed)
Your physician wants you to follow-up in: 6 months with Dr. Ferne Reus will receive a reminder letter in the mail two months in advance. If you don't receive a letter, please call our office to schedule the follow-up appointment.  Your physician has recommended you make the following change in your medication:   DECREASE LISINOPRIL 2.5 MG DAILY  Thank you for choosing [facility]!!

## 2015-12-05 NOTE — Progress Notes (Signed)
Cardiology Office Note  Date: 12/05/2015   ID: ABDO HABEEB, DOB [birth year], MRN [id number]  PCP: Gar Ponto, MD  Primary Cardiologist: [name], MD   Chief Complaint  Patient presents with  . History of AVR  . Coronary Artery Disease    History of Present Illness: TJADEN BLAMER is a medically complex 65 y.o. male last seen in April. He presents for a routine follow-up visit. Continues to follow with [name] for primary care, also sees Dr. Francesco Runner in [place] for pain management. He continues to report intermittent sense of palpitations. Also very fatigued. He states that he has been compliant with his medications including Lasix for control of leg edema. Weight is relatively stable compared to April.  Blood pressure is low today, I did make adjustments in his antihypertensives at the last visit. We discussed reducing lisinopril further and even consider stopping it altogether depending on blood pressure. Will try to keep him on Lopressor however in light of his history of palpitations and atrial fibrillation.  Echocardiogram from November last year is outlined below.  Past Medical History:  Diagnosis Date  . Anxiety   . Aortic stenosis    Status post AVR 5/13  . Arthritis   . Atrial fibrillation ([place])   . Chronic back pain    Lumbar spondylosis  . Coronary atherosclerosis of native coronary artery    Nonobstructive  . Dyslipidemia   . Fibromyalgia   . GERD (gastroesophageal reflux disease)   . Hx of colonic polyps   . Hyperlipidemia   . Long-term memory loss   . Major depressive disorder ([place])    Prior suicidal attempts  . Nocturia   . Peripheral vascular disease ([place])   . Seizures ([place])    6 yrs ago from withdrawal of Xanax to quickly  . Stroke ([place])   . Thoracic aortic aneurysm [facility])    Status post repair 7/13  . Tinnitus    Following prior seizures    Past Surgical History:  Procedure Laterality Date  . AORTIC VALVE REPLACEMENT  08/15/2011   Procedure: AORTIC VALVE REPLACEMENT (AVR);  Surgeon: [name], MD;  Location: [place];  Service: Open Heart Surgery;  Laterality: N/A;  . APPENDECTOMY    . CARDIAC CATHETERIZATION    . CHOLECYSTECTOMY     MM[facility]  . CIRCUMCISION    . COLONOSCOPY    . ESOPHAGOGASTRODUODENOSCOPY  04/07/08/13  . FEMORAL HERNIA REPAIR     x2  . FINGER CONTRACTURE RELEASE     Tendon release operation on his right little finger  . INCISIONAL HERNIA REPAIR  05/01/2011   Procedure: HERNIA REPAIR INCISIONAL;  Surgeon: [name], MD;  [facility];  Service: General;  Laterality: N/A;  with Mesh  . INGUINAL EXPLORATION     Right  . MEDIASTINAL EXPLORATION  10/08/2011   Procedure: MEDIASTINAL EXPLORATION;  Surgeon: [name], MD;  Location: [place];  Service: Open Heart Surgery;  Laterality: N/A;  . STERNOTOMY  10/07/2011   Procedure: STERNOTOMY;  Surgeon: [name], MD;  Location: [place];  Service: Open Heart Surgery;  Laterality: N/A;  . THORACIC AORTIC ANEURYSM REPAIR  10/07/2011   Procedure: THORACIC ASCENDING ANEURYSM REPAIR (AAA);  Surgeon: [name], MD;  Location: [place];  Service: Open Heart Surgery;  Laterality: N/A;  . TONSILLECTOMY     at age 33  . UMBILICAL HERNIA REPAIR     [place]    Current Outpatient Prescriptions  Medication Sig Dispense Refill  . ALPRAZolam (XANAX) 1 MG tablet Take 1 mg by mouth 4 (four) times daily as needed for anxiety.     . Artificial Saliva (BIOTENE MOISTURIZING MOUTH) SOLN Use as directed 1-2 sprays in the mouth or throat 3 (three) times daily.    [name] atorvastatin (LIPITOR) 80 MG tablet Take 80 mg by mouth daily.    . baclofen (LIORESAL) 10 MG tablet Take 10 mg by mouth 3 (three) times daily.    . Chlorpheniramine Maleate (ALLERGY RELIEF PO) Take 1 tablet by mouth daily as needed (allergies.).    [name] DULoxetine (CYMBALTA) 60 MG capsule Take 60 mg by mouth daily.    . furosemide (LASIX) 20 MG tablet Take 20 mg by mouth 2 (two) times daily.     [name] gabapentin (NEURONTIN) 300 MG capsule Take 600 mg by mouth 3 (three) times daily.    [name] levothyroxine (SYNTHROID, LEVOTHROID) 88 MCG tablet Take 88 mcg by mouth daily before breakfast.    . lisinopril (PRINIVIL,ZESTRIL) 5 MG tablet Take 1 tablet (5 mg total) by mouth daily. 90 tablet 3  . metoprolol tartrate (LOPRESSOR) 25 MG tablet TAKE ONE TABLET BY MOUTH THREE TIMES DAILY (MORNING, NOON, & IN THE EVENING) 90 tablet 6  . NITROSTAT 0.4 MG SL tablet Place 0.4 mg under the tongue every 5 (five) minutes as needed for chest pain.     . pantoprazole (PROTONIX) 40 MG tablet Take 40 mg by mouth daily.      . potassium chloride (K-DUR) 10 MEQ tablet Take 1 tablet (10 mEq total) by mouth daily. 90 tablet 3  . promethazine (PHENERGAN) 25 MG tablet Take 25 mg by mouth every 6 (six) hours as needed for nausea or vomiting.     . traZODone (DESYREL) 50 MG tablet Take 50 mg by mouth at bedtime as needed for sleep.    [name] warfarin (COUMADIN) 2 MG tablet Take 1-1.5 tablets (2-3 mg total) by mouth daily. Patient takes 1 tablet(2mg ) on Saturday and Wednesday and 1&1/2(3mg ) all other days 20 tablet 0  . warfarin (COUMADIN) 5 MG tablet Take 5 mg by mouth daily.     No current facility-administered medications for this visit.    Allergies:  Bupropion and Atorvastatin   Social History: The patient  reports that he has been smoking Cigarettes.  He started smoking about 53 years ago. He has a 24.00 pack-year smoking history. He has never used smokeless tobacco. He reports that he does not drink alcohol or use drugs.   ROS:  Please see the history of present illness. Otherwise, complete review of systems is positive for chronic back pain and limited mobility.  All other systems are reviewed and negative.   Physical Exam: VS:  BP (!) 99/56   Pulse (!) 50   Ht 6' (1.829 m)   Wt 217 lb (98.4 kg)   SpO2 95%   BMI 29.43 kg/m , BMI Body mass index is 29.43 kg/m.  Wt Readings from Last 3 Encounters:  12/05/15 217  lb (98.4 kg)  08/12/15 216 lb (98 kg)  07/26/15 219 lb (99.3 kg)    Chronically ill-appearing, no distress. HEENT: Conjunctiva and lids normal, oropharynx clear.  Neck: Supple, no elevated JVP or carotid bruits, no thyromegaly.  Lungs: Clear to auscultation, nonlabored breathing at rest.  Cardiac: Regular rate and rhythm with ectopy, loud crisp prosthetic valve click in S2, no S3, soft systolic murmur, no pericardial rub.  Abdomen: Soft, protuberant, nontender, bowel sounds  present. Skin: Warm and dry.  Extremities: Mild lower leg edema and venous stasis, spider veins, distal pulses 2+. Neuropsychiatric: Hard of hearing, interactive but overall slow speech.  ECG: I personally reviewed the tracing from 08/12/2015 which showed sinus rhythm with ventricular bigeminy, right bundle branch block.  Recent Labwork: 08/12/2015: ALT 14; AST 21; B Natriuretic Peptide 214.0; BUN 11; Creatinine, Ser 1.34; Hemoglobin 11.4; Platelets 127; Potassium 3.6; Sodium 139   Other Studies Reviewed Today:  Echocardiogram on 02/15/2015: Study Conclusions  - Left ventricle: The cavity size was normal. Wall thickness was  increased increased in a pattern of mild to moderate LVH.  Systolic function was normal. The estimated ejection fraction was  in the range of 60% to 65%. Doppler parameters are consistent  with abnormal left ventricular relaxation (grade 1 diastolic  dysfunction). - Aortic valve: There was no stenosis. There was trivial  regurgitation. Mean gradient (S): 4 mm Hg. Valve area (VTI): 3.41  cm^2. Valve area (Vmax): 3.33 cm^2. Valve area (Vmean): 3.5 cm^2. - Atrial septum: No defect or patent foramen ovale was identified. - Technically difficult study.  Assessment and Plan:  1. History of aortic stenosis status post mechanical AVR. He continues on Coumadin with follow-up per [name]. Valve function was normal by echocardiogram last year, mean gradient 4 mmHg. Murmur stable on  examination.  2. History of orthostatic dizziness and low blood pressure. Lisinopril will be cut back to 5 mg daily. May even need to be discontinued altogether depending on blood pressure. I asked him to keep follow with [name].  3. History of palpitations and previous atrial fibrillation. We will try and continue beta blocker. Concurrently on Coumadin with mechanical AVR.  4. History of emergent thoracic aortic aneurysm repair by Dr. Roxan Hockey in 2013.  Current medicines were reviewed with the patient today.  Disposition: Follow-up with me in 6 months.  Signed, Satira Sark, MD, [facility] 12/05/2015 4:01 PM    [facility] at [facility] [address] [place], [place] [address] Phone: [phone]; Fax: [phone]

## 2015-12-07 DIAGNOSIS — I359 Nonrheumatic aortic valve disorder, unspecified: Secondary | ICD-10-CM | POA: Diagnosis not present

## 2015-12-07 DIAGNOSIS — I240 Acute coronary thrombosis not resulting in myocardial infarction: Secondary | ICD-10-CM | POA: Diagnosis not present

## 2015-12-11 DIAGNOSIS — I240 Acute coronary thrombosis not resulting in myocardial infarction: Secondary | ICD-10-CM | POA: Diagnosis not present

## 2015-12-11 DIAGNOSIS — I359 Nonrheumatic aortic valve disorder, unspecified: Secondary | ICD-10-CM | POA: Diagnosis not present

## 2015-12-18 DIAGNOSIS — I359 Nonrheumatic aortic valve disorder, unspecified: Secondary | ICD-10-CM | POA: Diagnosis not present

## 2015-12-18 DIAGNOSIS — I240 Acute coronary thrombosis not resulting in myocardial infarction: Secondary | ICD-10-CM | POA: Diagnosis not present

## 2015-12-20 DIAGNOSIS — Z79899 Other long term (current) drug therapy: Secondary | ICD-10-CM | POA: Diagnosis not present

## 2015-12-20 DIAGNOSIS — G8929 Other chronic pain: Secondary | ICD-10-CM | POA: Diagnosis not present

## 2015-12-20 DIAGNOSIS — M47816 Spondylosis without myelopathy or radiculopathy, lumbar region: Secondary | ICD-10-CM | POA: Diagnosis not present

## 2015-12-20 DIAGNOSIS — M5136 Other intervertebral disc degeneration, lumbar region: Secondary | ICD-10-CM | POA: Diagnosis not present

## 2015-12-22 DIAGNOSIS — I359 Nonrheumatic aortic valve disorder, unspecified: Secondary | ICD-10-CM | POA: Diagnosis not present

## 2015-12-25 ENCOUNTER — Other Ambulatory Visit: Payer: Self-pay | Admitting: Licensed Clinical Social Worker

## 2015-12-25 NOTE — Patient Outreach (Signed)
Assessment:  CSW spoke with client on 12/25/15. CSW verified client identity. CSW and client spoke of client needs.  Client said he had his prescribed medications and is taking medications as prescribed. Client said he has been receiving Meals on Wheels support with frozen meals assistance from that program.  Client sees [name] as primary care physician. Client receives telephone counseling support with [facility]. Client and  CSW had spoken recently about housing needs at present for client. [place] said he would have to find new residence soon since his lease is up at current residence. CSW had mailed client a list of housing resources in the area recently. CSW had talked with client about housing options for client in the area.  Client has been looking for new housing locations for client in the area. Client said he is buying a car at present. Client has reduced family support. He is trying to attend all scheduled client medical appointments.  Client said he is trying to take care of his monthly bills. Client said he uses a cane to ambulate. He said he is having some difficulty paying copays at his medical appointments.  He said he is going to appointments at [facility] as scheduled. Client said he is driving car to short distance appointments and to do local errands.  Client said Meals on Wheels frozen meals are helpful to him.  Client said he has equipment needed in the home.  Client said he dresses slowly in the morning. Client said he went to appointment with cardiologist in the past month.  CSW encouraged client to speak with landlord about landlord putting grab bars into client residence as needed. Client said client and [name] have spoken of level of care needs for client.  Client said he has easy access in and out of his current residence.  Client said that he has a friend who sometimes takes him to eat and sometimes provides some food to client.  Client said he will have  appointment for blood check soon at practice of [name].  Client said he will have appointment with [name] witnin two weeks. Client spoke of pain issues. Client is going to [facility] Clinic as scheduled for assistance in managing client pain.  Client has [employer] support. Client said that his medications are affordable.  CSW encouraged Ronit to speak with his primary care doctor about level of care needs currently for client. CSW thanked [place] for phone call with CSW on 12/25/15.   Plan:  Client to research housing options for client in the area and to determine housing options for client in the area in next 30 days.  CSW to call client in 4 weeks to assess client needs.   [name].[place] Sigmund MSW, LCSW Licensed Clinical Social Worker [facility] Care Management [phone]

## 2015-12-29 DIAGNOSIS — I359 Nonrheumatic aortic valve disorder, unspecified: Secondary | ICD-10-CM | POA: Diagnosis not present

## 2016-01-05 ENCOUNTER — Other Ambulatory Visit: Payer: Self-pay | Admitting: Licensed Clinical Social Worker

## 2016-01-05 DIAGNOSIS — I240 Acute coronary thrombosis not resulting in myocardial infarction: Secondary | ICD-10-CM | POA: Diagnosis not present

## 2016-01-05 DIAGNOSIS — E782 Mixed hyperlipidemia: Secondary | ICD-10-CM | POA: Diagnosis not present

## 2016-01-05 NOTE — Patient Outreach (Signed)
Assessment:  Quarterly Mental Health Case Review was held at [facility] office on 01/05/16.  Dr. Casimiro Needle, psychiatrist, was present for meeting along with the following participants:  Theadore Nan CSW, [employer], [place], [place], CSW, [place], CSW, Eduard Clos, CSW, Rhonda Rumple [facility] Director, and Mahlon Gammon, [facility] Nurse Specialist.  CSW Nicki Reaper Nurah Petrides presented case on [name] and talked with Dr. Casimiro Needle and group about current client needs.  Group spoke of several ideas for seeking to improve social relationships of client with friends and care providers.  Dr. Casimiro Needle recommended that CSW call LPN at practice of [name] and suggest that [name] may want to consider lowering client dosage of Cymbalta and consider adding Welbutrin to client medication list.  Dr. Casimiro Needle also said that client past stroke may be a cause of some of client's depression symptoms.  Following Quarterly Mental Health Case Review meeting, CSW called Amy Hopkins, LPN at practice of Dr. Gar Ponto.  CSW informed Adalberto Cole, LPN at practice of [name] of above recommendations of Dr. Casimiro Needle, psychiatrist, regarding client on 01/05/16. Amy was appreciative of information given by CSW and said she would relate this information to [name]. CSW gave Adalberto Cole, LPN the phone number for Dr. Casimiro Needle ([phone]).  CSW informed Amy that Dr. Casimiro Needle was agreeable for [name] to call Dr. Casimiro Needle to discuss mental health needs of client.  CSW thanked [employer] for phone call with CSW on 01/05/16.  Plan:  CSW to call client as scheduled to discuss client needs.  [name].Kalia Vahey MSW, LCSW Licensed Clinical Social Worker [facility] Care Management [phone]

## 2016-01-09 DIAGNOSIS — E782 Mixed hyperlipidemia: Secondary | ICD-10-CM | POA: Diagnosis not present

## 2016-01-09 DIAGNOSIS — Z23 Encounter for immunization: Secondary | ICD-10-CM | POA: Diagnosis not present

## 2016-01-09 DIAGNOSIS — F331 Major depressive disorder, recurrent, moderate: Secondary | ICD-10-CM | POA: Diagnosis not present

## 2016-01-09 DIAGNOSIS — F17210 Nicotine dependence, cigarettes, uncomplicated: Secondary | ICD-10-CM | POA: Diagnosis not present

## 2016-01-09 DIAGNOSIS — E039 Hypothyroidism, unspecified: Secondary | ICD-10-CM | POA: Diagnosis not present

## 2016-01-09 DIAGNOSIS — I359 Nonrheumatic aortic valve disorder, unspecified: Secondary | ICD-10-CM | POA: Diagnosis not present

## 2016-01-09 DIAGNOSIS — Z0001 Encounter for general adult medical examination with abnormal findings: Secondary | ICD-10-CM | POA: Diagnosis not present

## 2016-01-09 DIAGNOSIS — Z6832 Body mass index (BMI) 32.0-32.9, adult: Secondary | ICD-10-CM | POA: Diagnosis not present

## 2016-01-17 DIAGNOSIS — Z79899 Other long term (current) drug therapy: Secondary | ICD-10-CM | POA: Diagnosis not present

## 2016-01-17 DIAGNOSIS — M5136 Other intervertebral disc degeneration, lumbar region: Secondary | ICD-10-CM | POA: Diagnosis not present

## 2016-01-17 DIAGNOSIS — G8929 Other chronic pain: Secondary | ICD-10-CM | POA: Diagnosis not present

## 2016-01-17 DIAGNOSIS — M797 Fibromyalgia: Secondary | ICD-10-CM | POA: Diagnosis not present

## 2016-01-17 DIAGNOSIS — M47816 Spondylosis without myelopathy or radiculopathy, lumbar region: Secondary | ICD-10-CM | POA: Diagnosis not present

## 2016-01-24 DIAGNOSIS — I359 Nonrheumatic aortic valve disorder, unspecified: Secondary | ICD-10-CM | POA: Diagnosis not present

## 2016-01-25 ENCOUNTER — Other Ambulatory Visit: Payer: Self-pay | Admitting: Licensed Clinical Social Worker

## 2016-01-25 NOTE — Patient Outreach (Signed)
Assessment:  CSW spoke via phone with client on 01/25/16. CSW verified client identity. CSW and client spoke of client needs. Client sees [name] as primary care doctor. Client saw [name] in past two weeks for a medical appointment. CSW spoke with client about social interaction.  CSW encouraged client to consider participating in [facility] activities in [place], [place]. CSW encouraged client to consider joining support group as able.  CSW encouraged client to socialize with friends in area to prevent social isolation of client. Client said he goes out to eat occasionally with a friend in the area.  Client said he had his prescribed medications and was taking medications as prescribed. Client said has pain in joints. He said he is taking a pain medication as prescribed. He said he goes to Pain Clinic in [place], [place].  He said he had appointment at Pain Clinic in [place], [place] last week. Client receives Meals on Wheels support (frozen meals support) as scheduled.  Client said that representative from [facility] plans to do home visit with client today to discuss current client needs. Client has car and is using car to go to medical appointments scheduled and to do community errands.  He says he is weak or tired occasionally. He said he has to take time to rest periodically.   He walks with use of cane.  CSW spoke with client about client care plan. CSW encouraged client to participate in scheduled phone calls with [facility] counselor as scheduled.  Client has his prescribed medications delivered to his home from [employer].  Client said he has little family support. CSW encouraged client to use his car to involve in social activities of choice in the community.  CSW congratulated client on attending appointments with client's primary doctor and on attending appointments at Pain Clinic in [place], [place].   Plan:  Client to  participate in scheduled phone calls with [facility] counselor as scheduled.   CSW to call client in 4 weeks to assess client needs.  [name].Celena Lanius MSW, LCSW Licensed Clinical Social Worker [facility] Care Management [phone]

## 2016-02-24 DIAGNOSIS — R791 Abnormal coagulation profile: Secondary | ICD-10-CM | POA: Diagnosis not present

## 2016-02-24 DIAGNOSIS — M25552 Pain in left hip: Secondary | ICD-10-CM | POA: Diagnosis not present

## 2016-02-24 DIAGNOSIS — E785 Hyperlipidemia, unspecified: Secondary | ICD-10-CM | POA: Diagnosis not present

## 2016-02-24 DIAGNOSIS — C8593 Non-Hodgkin lymphoma, unspecified, intra-abdominal lymph nodes: Secondary | ICD-10-CM | POA: Diagnosis not present

## 2016-02-24 DIAGNOSIS — M19019 Primary osteoarthritis, unspecified shoulder: Secondary | ICD-10-CM | POA: Diagnosis not present

## 2016-02-24 DIAGNOSIS — Z952 Presence of prosthetic heart valve: Secondary | ICD-10-CM | POA: Diagnosis not present

## 2016-02-24 DIAGNOSIS — M25551 Pain in right hip: Secondary | ICD-10-CM | POA: Diagnosis not present

## 2016-02-24 DIAGNOSIS — I493 Ventricular premature depolarization: Secondary | ICD-10-CM | POA: Diagnosis not present

## 2016-02-24 DIAGNOSIS — I639 Cerebral infarction, unspecified: Secondary | ICD-10-CM | POA: Diagnosis not present

## 2016-02-24 DIAGNOSIS — R160 Hepatomegaly, not elsewhere classified: Secondary | ICD-10-CM | POA: Diagnosis not present

## 2016-02-24 DIAGNOSIS — Z7401 Bed confinement status: Secondary | ICD-10-CM | POA: Diagnosis not present

## 2016-02-24 DIAGNOSIS — H5461 Unqualified visual loss, right eye, normal vision left eye: Secondary | ICD-10-CM | POA: Diagnosis not present

## 2016-02-24 DIAGNOSIS — F17210 Nicotine dependence, cigarettes, uncomplicated: Secondary | ICD-10-CM | POA: Diagnosis not present

## 2016-02-24 DIAGNOSIS — F17200 Nicotine dependence, unspecified, uncomplicated: Secondary | ICD-10-CM | POA: Diagnosis not present

## 2016-02-24 DIAGNOSIS — C8590 Non-Hodgkin lymphoma, unspecified, unspecified site: Secondary | ICD-10-CM | POA: Diagnosis not present

## 2016-02-24 DIAGNOSIS — S79911A Unspecified injury of right hip, initial encounter: Secondary | ICD-10-CM | POA: Diagnosis not present

## 2016-02-24 DIAGNOSIS — R591 Generalized enlarged lymph nodes: Secondary | ICD-10-CM | POA: Diagnosis not present

## 2016-02-24 DIAGNOSIS — F40240 Claustrophobia: Secondary | ICD-10-CM | POA: Diagnosis not present

## 2016-02-24 DIAGNOSIS — S79912A Unspecified injury of left hip, initial encounter: Secondary | ICD-10-CM | POA: Diagnosis not present

## 2016-02-24 DIAGNOSIS — I350 Nonrheumatic aortic (valve) stenosis: Secondary | ICD-10-CM | POA: Diagnosis not present

## 2016-02-24 DIAGNOSIS — R590 Localized enlarged lymph nodes: Secondary | ICD-10-CM | POA: Diagnosis not present

## 2016-02-24 DIAGNOSIS — Z9181 History of falling: Secondary | ICD-10-CM | POA: Diagnosis not present

## 2016-02-24 DIAGNOSIS — I252 Old myocardial infarction: Secondary | ICD-10-CM | POA: Diagnosis not present

## 2016-02-24 DIAGNOSIS — R531 Weakness: Secondary | ICD-10-CM | POA: Diagnosis not present

## 2016-02-24 DIAGNOSIS — R079 Chest pain, unspecified: Secondary | ICD-10-CM | POA: Diagnosis not present

## 2016-02-24 DIAGNOSIS — R32 Unspecified urinary incontinence: Secondary | ICD-10-CM | POA: Diagnosis not present

## 2016-02-24 DIAGNOSIS — C8596 Non-Hodgkin lymphoma, unspecified, intrapelvic lymph nodes: Secondary | ICD-10-CM | POA: Diagnosis not present

## 2016-02-24 DIAGNOSIS — R1312 Dysphagia, oropharyngeal phase: Secondary | ICD-10-CM | POA: Diagnosis not present

## 2016-02-24 DIAGNOSIS — Z8673 Personal history of transient ischemic attack (TIA), and cerebral infarction without residual deficits: Secondary | ICD-10-CM | POA: Diagnosis not present

## 2016-02-24 DIAGNOSIS — I4891 Unspecified atrial fibrillation: Secondary | ICD-10-CM | POA: Diagnosis not present

## 2016-02-24 DIAGNOSIS — M797 Fibromyalgia: Secondary | ICD-10-CM | POA: Diagnosis not present

## 2016-02-24 DIAGNOSIS — I4510 Unspecified right bundle-branch block: Secondary | ICD-10-CM | POA: Diagnosis not present

## 2016-02-24 DIAGNOSIS — I10 Essential (primary) hypertension: Secondary | ICD-10-CM | POA: Diagnosis not present

## 2016-02-24 DIAGNOSIS — K7689 Other specified diseases of liver: Secondary | ICD-10-CM | POA: Diagnosis not present

## 2016-02-24 DIAGNOSIS — R279 Unspecified lack of coordination: Secondary | ICD-10-CM | POA: Diagnosis not present

## 2016-02-24 DIAGNOSIS — R4182 Altered mental status, unspecified: Secondary | ICD-10-CM | POA: Diagnosis not present

## 2016-02-24 DIAGNOSIS — Z7901 Long term (current) use of anticoagulants: Secondary | ICD-10-CM | POA: Diagnosis not present

## 2016-02-24 DIAGNOSIS — R0789 Other chest pain: Secondary | ICD-10-CM | POA: Diagnosis not present

## 2016-02-24 DIAGNOSIS — S0990XA Unspecified injury of head, initial encounter: Secondary | ICD-10-CM | POA: Diagnosis not present

## 2016-02-24 DIAGNOSIS — Z5321 Procedure and treatment not carried out due to patient leaving prior to being seen by health care provider: Secondary | ICD-10-CM | POA: Diagnosis not present

## 2016-02-24 DIAGNOSIS — M6281 Muscle weakness (generalized): Secondary | ICD-10-CM | POA: Diagnosis not present

## 2016-02-24 DIAGNOSIS — I480 Paroxysmal atrial fibrillation: Secondary | ICD-10-CM | POA: Diagnosis not present

## 2016-02-24 DIAGNOSIS — R05 Cough: Secondary | ICD-10-CM | POA: Diagnosis not present

## 2016-02-26 DIAGNOSIS — I359 Nonrheumatic aortic valve disorder, unspecified: Secondary | ICD-10-CM | POA: Diagnosis not present

## 2016-02-27 ENCOUNTER — Other Ambulatory Visit: Payer: Self-pay | Admitting: Licensed Clinical Social Worker

## 2016-02-27 NOTE — Patient Outreach (Signed)
Assessment:  CSW spoke with client via phone on 02/27/16. CSW verified client identity. CSW and client spoke of client needs.Cient sees [name] as primary care doctor. Client is attending medical appointments as scheduled. Client said he had his prescribed medications and is taking medications as prescribed.   CSW has encouraged client to involve himself in [facility] in [place], [place]. [place] has encouraged client to join a support group in the area to help client meet new friends.  Client said he does go out to eat occasionally with one friend in the area. He enjoys eating out with this friend as a means of socialization.  Client goes to Pain Clinic in [place], [place] as scheduled. Client receives Meals on Wheels (frozen meals) regulalrly to help client with client food needs. Client has a car and is using car to transport him to and from his scheduled medical appointments. Client walks with use of a cane. CSW and client spoke of client care plan.  Client said he fatigues occasionally and has to take rest breaks as needed.Client receives calls periodically from [place]. CSW encouraged client to participate in phone calls he receives from [facility]. Client said he received care at [facility] recently. He discharged home from the hospital and said he went to see his primary doctor yesterday.([name]). [name] received results from [facility] tests for client. . Dr. Olena Heckle had informed client that client had experienced two strokes.  Client said he had a stroke about 10 years ago and has had a more recent stroke in past few weeks.  He said he has joint pain currently. He has appointment at Pain Clinic in [place] on 03/13/16.  He said his nephew helped him recently with transport needs of client. Client said that his nephew plans to help transport client to and from client medical appointment on 03/13/16.  CSW  encouraged client to socialize with others as he is able. CSW encouraged client to call CSW at [phone] as needed to discuss social work needs of client. CSW encouraged client to call 911 as needed for emergency help for client.  Client was appreciative of call from [place] on 02/27/16.    Plan:  Client  to participate in scheduled calls with [facility] Counselor in next 30 days.  CSW to call client in 4 weeks to assess client needs.  [name].Monish Haliburton MSW, LCSW Licensed Clinical Social Worker [facility] Care Management [phone]

## 2016-02-28 ENCOUNTER — Other Ambulatory Visit (HOSPITAL_COMMUNITY): Payer: Self-pay | Admitting: Family Medicine

## 2016-02-28 DIAGNOSIS — K769 Liver disease, unspecified: Secondary | ICD-10-CM

## 2016-03-01 ENCOUNTER — Ambulatory Visit (HOSPITAL_COMMUNITY): Admission: RE | Admit: 2016-03-01 | Payer: Commercial Managed Care - HMO | Source: Ambulatory Visit

## 2016-03-01 ENCOUNTER — Encounter (HOSPITAL_COMMUNITY): Payer: Self-pay

## 2016-03-05 ENCOUNTER — Other Ambulatory Visit (HOSPITAL_COMMUNITY): Payer: Self-pay | Admitting: Family Medicine

## 2016-03-05 DIAGNOSIS — R599 Enlarged lymph nodes, unspecified: Secondary | ICD-10-CM

## 2016-03-05 DIAGNOSIS — K769 Liver disease, unspecified: Secondary | ICD-10-CM

## 2016-03-06 ENCOUNTER — Ambulatory Visit (HOSPITAL_COMMUNITY): Payer: Commercial Managed Care - HMO

## 2016-03-07 DIAGNOSIS — I359 Nonrheumatic aortic valve disorder, unspecified: Secondary | ICD-10-CM | POA: Diagnosis not present

## 2016-03-13 DIAGNOSIS — M797 Fibromyalgia: Secondary | ICD-10-CM | POA: Diagnosis not present

## 2016-03-13 DIAGNOSIS — M47816 Spondylosis without myelopathy or radiculopathy, lumbar region: Secondary | ICD-10-CM | POA: Diagnosis not present

## 2016-03-13 DIAGNOSIS — G8929 Other chronic pain: Secondary | ICD-10-CM | POA: Diagnosis not present

## 2016-03-13 DIAGNOSIS — M5136 Other intervertebral disc degeneration, lumbar region: Secondary | ICD-10-CM | POA: Diagnosis not present

## 2016-03-13 DIAGNOSIS — Z79891 Long term (current) use of opiate analgesic: Secondary | ICD-10-CM | POA: Diagnosis not present

## 2016-03-14 ENCOUNTER — Encounter (HOSPITAL_COMMUNITY): Payer: Commercial Managed Care - HMO

## 2016-03-18 ENCOUNTER — Other Ambulatory Visit: Payer: Self-pay | Admitting: Cardiology

## 2016-03-26 ENCOUNTER — Encounter (HOSPITAL_COMMUNITY): Payer: Commercial Managed Care - HMO | Attending: Family Medicine

## 2016-03-26 DIAGNOSIS — R590 Localized enlarged lymph nodes: Secondary | ICD-10-CM | POA: Diagnosis not present

## 2016-03-26 DIAGNOSIS — R160 Hepatomegaly, not elsewhere classified: Secondary | ICD-10-CM | POA: Diagnosis not present

## 2016-03-26 DIAGNOSIS — R55 Syncope and collapse: Secondary | ICD-10-CM | POA: Diagnosis not present

## 2016-03-26 DIAGNOSIS — Z6832 Body mass index (BMI) 32.0-32.9, adult: Secondary | ICD-10-CM | POA: Diagnosis not present

## 2016-03-27 DIAGNOSIS — I719 Aortic aneurysm of unspecified site, without rupture: Secondary | ICD-10-CM | POA: Diagnosis not present

## 2016-03-27 DIAGNOSIS — I359 Nonrheumatic aortic valve disorder, unspecified: Secondary | ICD-10-CM | POA: Diagnosis not present

## 2016-03-27 DIAGNOSIS — R300 Dysuria: Secondary | ICD-10-CM | POA: Diagnosis not present

## 2016-03-28 ENCOUNTER — Other Ambulatory Visit: Payer: Self-pay | Admitting: Licensed Clinical Social Worker

## 2016-03-28 NOTE — Patient Outreach (Signed)
Assessment:  CSW spoke via phone with client. CSW verified client identity. CSW and client spoke of client needs. Client sees Dr. Gar Ponto as primary care doctor for client. Client said he is trying to attend scheduled client medical appointments.  Client said he is attending appointments, as scheduled, at a Pain Clinic in [place], [place].  He said he is taking pain medication as prescribed. Client said in recent medical test, that "they found a spot on my liver and found two spots on my brain " (client words).  Client said he has upcoming appointment for medical test in [place], [place] and he is working out transportation arrangements to go to and from that scheduled medical appointment. Client said he has a reliable car and can afford gas for car. Client said he will talk with a friend to see if friend can help transport client to and from client's medical appointment in [place], [place].  Client said he has adequate food supply. He said he sometimes becomes fatigued and has to take rest breaks for fatigue. He said he had his prescribed medications and is taking medications as prescribed.  Client uses a cane to assist him in ambulation. Client receives meal support (frozen meals from [facility]) to help with food needs of client. Client said that frozen meals he receives from [facility] are a two week supply of frozen meals. CSW and client spoke of client care plan.  CSW encouraged client to participate in scheduled calls with [facility] counselor in next 30 days.  CSW thanked [place] for phone conversation with CSW. CSW encouraged [place] to call CSW at [phone] as needed to discuss social work needs of client.  Client was appreciative of call from [place].  Client said he will try to attend all scheduled client medical appointments.    Plan:  Client to participate in scheduled calls with [facility] counselor in next 30 days.  CSW to call client in 4 weeks to assess client needs at  that time.  [name].Shaheen Star MSW, LCSW Licensed Clinical Social Worker [facility] Care Management [phone]

## 2016-03-29 DIAGNOSIS — I359 Nonrheumatic aortic valve disorder, unspecified: Secondary | ICD-10-CM | POA: Diagnosis not present

## 2016-03-29 DIAGNOSIS — I240 Acute coronary thrombosis not resulting in myocardial infarction: Secondary | ICD-10-CM | POA: Diagnosis not present

## 2016-04-04 DIAGNOSIS — I359 Nonrheumatic aortic valve disorder, unspecified: Secondary | ICD-10-CM | POA: Diagnosis not present

## 2016-04-08 DIAGNOSIS — R160 Hepatomegaly, not elsewhere classified: Secondary | ICD-10-CM | POA: Diagnosis not present

## 2016-04-08 DIAGNOSIS — N183 Chronic kidney disease, stage 3 (moderate): Secondary | ICD-10-CM | POA: Diagnosis not present

## 2016-04-08 DIAGNOSIS — E039 Hypothyroidism, unspecified: Secondary | ICD-10-CM | POA: Diagnosis not present

## 2016-04-08 DIAGNOSIS — J449 Chronic obstructive pulmonary disease, unspecified: Secondary | ICD-10-CM | POA: Diagnosis not present

## 2016-04-08 DIAGNOSIS — Z1212 Encounter for screening for malignant neoplasm of rectum: Secondary | ICD-10-CM | POA: Diagnosis not present

## 2016-04-08 DIAGNOSIS — R7301 Impaired fasting glucose: Secondary | ICD-10-CM | POA: Diagnosis not present

## 2016-04-08 DIAGNOSIS — E876 Hypokalemia: Secondary | ICD-10-CM | POA: Diagnosis not present

## 2016-04-08 DIAGNOSIS — E782 Mixed hyperlipidemia: Secondary | ICD-10-CM | POA: Diagnosis not present

## 2016-04-08 DIAGNOSIS — K219 Gastro-esophageal reflux disease without esophagitis: Secondary | ICD-10-CM | POA: Diagnosis not present

## 2016-04-08 DIAGNOSIS — I359 Nonrheumatic aortic valve disorder, unspecified: Secondary | ICD-10-CM | POA: Diagnosis not present

## 2016-04-09 ENCOUNTER — Telehealth: Payer: Self-pay | Admitting: Cardiology

## 2016-04-09 NOTE — Telephone Encounter (Signed)
No specific cardiac contraindication for him to use morphine at appropriate doses in a supervised setting.

## 2016-04-09 NOTE — Telephone Encounter (Signed)
Patient notified.  Is seeing another provider at Dr. Ellouise Newer' office in [place], [place].

## 2016-04-09 NOTE — Telephone Encounter (Signed)
Patient is now taking morphine for pain management.  He would like to know if this is ok for him with his heart condition.

## 2016-04-12 DIAGNOSIS — F17210 Nicotine dependence, cigarettes, uncomplicated: Secondary | ICD-10-CM | POA: Diagnosis not present

## 2016-04-12 DIAGNOSIS — E039 Hypothyroidism, unspecified: Secondary | ICD-10-CM | POA: Diagnosis not present

## 2016-04-12 DIAGNOSIS — F331 Major depressive disorder, recurrent, moderate: Secondary | ICD-10-CM | POA: Diagnosis not present

## 2016-04-12 DIAGNOSIS — I359 Nonrheumatic aortic valve disorder, unspecified: Secondary | ICD-10-CM | POA: Diagnosis not present

## 2016-04-12 DIAGNOSIS — E782 Mixed hyperlipidemia: Secondary | ICD-10-CM | POA: Diagnosis not present

## 2016-04-12 DIAGNOSIS — Z1212 Encounter for screening for malignant neoplasm of rectum: Secondary | ICD-10-CM | POA: Diagnosis not present

## 2016-04-12 DIAGNOSIS — I719 Aortic aneurysm of unspecified site, without rupture: Secondary | ICD-10-CM | POA: Diagnosis not present

## 2016-04-12 DIAGNOSIS — I2510 Atherosclerotic heart disease of native coronary artery without angina pectoris: Secondary | ICD-10-CM | POA: Diagnosis not present

## 2016-04-15 DIAGNOSIS — I359 Nonrheumatic aortic valve disorder, unspecified: Secondary | ICD-10-CM | POA: Diagnosis not present

## 2016-04-15 DIAGNOSIS — K5732 Diverticulitis of large intestine without perforation or abscess without bleeding: Secondary | ICD-10-CM | POA: Diagnosis not present

## 2016-04-15 DIAGNOSIS — K219 Gastro-esophageal reflux disease without esophagitis: Secondary | ICD-10-CM | POA: Diagnosis not present

## 2016-04-19 DIAGNOSIS — I359 Nonrheumatic aortic valve disorder, unspecified: Secondary | ICD-10-CM | POA: Diagnosis not present

## 2016-04-22 DIAGNOSIS — M47816 Spondylosis without myelopathy or radiculopathy, lumbar region: Secondary | ICD-10-CM | POA: Diagnosis not present

## 2016-04-22 DIAGNOSIS — G8929 Other chronic pain: Secondary | ICD-10-CM | POA: Diagnosis not present

## 2016-04-25 ENCOUNTER — Encounter (HOSPITAL_COMMUNITY): Payer: Self-pay | Admitting: Emergency Medicine

## 2016-04-25 ENCOUNTER — Emergency Department (HOSPITAL_COMMUNITY)
Admission: EM | Admit: 2016-04-25 | Discharge: 2016-04-25 | Discharge status: Against Medical Advice | Payer: Medicare HMO | Attending: Emergency Medicine | Admitting: Emergency Medicine

## 2016-04-25 ENCOUNTER — Emergency Department (HOSPITAL_COMMUNITY): Payer: Medicare HMO

## 2016-04-25 DIAGNOSIS — R05 Cough: Secondary | ICD-10-CM | POA: Diagnosis not present

## 2016-04-25 DIAGNOSIS — R000 Tachycardia, unspecified: Secondary | ICD-10-CM | POA: Diagnosis not present

## 2016-04-25 DIAGNOSIS — Z7901 Long term (current) use of anticoagulants: Secondary | ICD-10-CM | POA: Diagnosis not present

## 2016-04-25 DIAGNOSIS — R609 Edema, unspecified: Secondary | ICD-10-CM | POA: Insufficient documentation

## 2016-04-25 DIAGNOSIS — Z79899 Other long term (current) drug therapy: Secondary | ICD-10-CM | POA: Diagnosis not present

## 2016-04-25 DIAGNOSIS — I2510 Atherosclerotic heart disease of native coronary artery without angina pectoris: Secondary | ICD-10-CM | POA: Diagnosis not present

## 2016-04-25 DIAGNOSIS — F17210 Nicotine dependence, cigarettes, uncomplicated: Secondary | ICD-10-CM | POA: Insufficient documentation

## 2016-04-25 DIAGNOSIS — R079 Chest pain, unspecified: Secondary | ICD-10-CM | POA: Diagnosis not present

## 2016-04-25 DIAGNOSIS — I214 Non-ST elevation (NSTEMI) myocardial infarction: Secondary | ICD-10-CM

## 2016-04-25 DIAGNOSIS — K5900 Constipation, unspecified: Secondary | ICD-10-CM | POA: Diagnosis not present

## 2016-04-25 DIAGNOSIS — R509 Fever, unspecified: Secondary | ICD-10-CM | POA: Diagnosis not present

## 2016-04-25 DIAGNOSIS — R0902 Hypoxemia: Secondary | ICD-10-CM | POA: Diagnosis not present

## 2016-04-25 DIAGNOSIS — R52 Pain, unspecified: Secondary | ICD-10-CM | POA: Diagnosis not present

## 2016-04-25 LAB — CBC WITH DIFFERENTIAL/PLATELET
BASOS ABS: 0 10*3/uL (ref 0.0–0.1)
Basophils Relative: 0 %
Eosinophils Absolute: 0 10*3/uL (ref 0.0–0.7)
Eosinophils Relative: 0 %
HEMATOCRIT: 36.7 % — AB (ref 39.0–52.0)
HEMOGLOBIN: 11.9 g/dL — AB (ref 13.0–17.0)
LYMPHS ABS: 0.9 10*3/uL (ref 0.7–4.0)
Lymphocytes Relative: 10 %
MCH: 30.6 pg (ref 26.0–34.0)
MCHC: 32.4 g/dL (ref 30.0–36.0)
MCV: 94.3 fL (ref 78.0–100.0)
Monocytes Absolute: 0.7 10*3/uL (ref 0.1–1.0)
Monocytes Relative: 9 %
NEUTROS ABS: 6.7 10*3/uL (ref 1.7–7.7)
Neutrophils Relative %: 81 %
Platelets: 106 10*3/uL — ABNORMAL LOW (ref 150–400)
RBC: 3.89 MIL/uL — AB (ref 4.22–5.81)
RDW: 15.6 % — ABNORMAL HIGH (ref 11.5–15.5)
WBC: 8.3 10*3/uL (ref 4.0–10.5)

## 2016-04-25 LAB — LACTIC ACID, PLASMA
LACTIC ACID, VENOUS: 1.8 mmol/L (ref 0.5–1.9)
Lactic Acid, Venous: 1.3 mmol/L (ref 0.5–1.9)

## 2016-04-25 LAB — INFLUENZA PANEL BY PCR (TYPE A & B)
Influenza A By PCR: NEGATIVE
Influenza B By PCR: NEGATIVE

## 2016-04-25 LAB — COMPREHENSIVE METABOLIC PANEL
ALK PHOS: 82 U/L (ref 38–126)
ALT: 11 U/L — AB (ref 17–63)
AST: 26 U/L (ref 15–41)
Albumin: 3.5 g/dL (ref 3.5–5.0)
Anion gap: 10 (ref 5–15)
BILIRUBIN TOTAL: 1.2 mg/dL (ref 0.3–1.2)
BUN: 10 mg/dL (ref 6–20)
CALCIUM: 8.6 mg/dL — AB (ref 8.9–10.3)
CHLORIDE: 103 mmol/L (ref 101–111)
CO2: 25 mmol/L (ref 22–32)
CREATININE: 1.24 mg/dL (ref 0.61–1.24)
GFR calc Af Amer: >60 mL/min (ref >60)
GFR calc non Af Amer: 60 mL/min — ABNORMAL LOW (ref >60)
Glucose, Bld: 84 mg/dL (ref 65–99)
Potassium: 3.3 mmol/L — ABNORMAL LOW (ref 3.5–5.1)
Sodium: 138 mmol/L (ref 135–145)
TOTAL PROTEIN: 8.1 g/dL (ref 6.5–8.1)

## 2016-04-25 LAB — PROTIME-INR
INR: 1.5
Prothrombin Time: 18.3 seconds — ABNORMAL HIGH (ref 11.4–15.2)

## 2016-04-25 LAB — TROPONIN I
TROPONIN I: 0.23 ng/mL — AB (ref <0.03)
TROPONIN I: 0.2 ng/mL — AB (ref <0.03)

## 2016-04-25 LAB — BRAIN NATRIURETIC PEPTIDE: B NATRIURETIC PEPTIDE 5: 214 pg/mL — AB (ref 0.0–100.0)

## 2016-04-25 MED ORDER — SODIUM CHLORIDE 0.9 % IV BOLUS (SEPSIS)
500.0000 mL | Freq: Once | INTRAVENOUS | Status: AC
  Administered 2016-04-25: 500 mL via INTRAVENOUS

## 2016-04-25 MED ORDER — ASPIRIN 81 MG PO CHEW
324.0000 mg | CHEWABLE_TABLET | Freq: Once | ORAL | Status: AC
  Administered 2016-04-25: 324 mg via ORAL
  Filled 2016-04-25: qty 4

## 2016-04-25 NOTE — ED Triage Notes (Signed)
PT c/o generlized body aches, weakness, nasal congestion, bilateral lower edema with redness and pain to left lower leg. PT has been on cipro x6 days for unknown infection. PT states he called EMS today d/t weakness and was unable to get up out of bed and lives alone. CBG 96

## 2016-04-25 NOTE — Discharge Instructions (Signed)
You were seen in the ED today with weakness and leg swelling. We found evidence of significant heart strain that could be from a heart attack, valve dysfunction, or blood clot in the lungs. You are choosing to leave against medical advice. I feel that this decision to leave will likely lead to you becoming much more sick and could evan cause you to die. We have discussed this risk and my concern in great detail.   You are welcome and encouraged to return to the Emergency Department at any time if you change your mind. We are happy to see you here. I would encourage you to return ASAP.

## 2016-04-25 NOTE — ED Notes (Signed)
PT family arrived at this time and pt is requesting to sign out AMA. EDP made aware and stated he would go talk to the pt about AMA status.

## 2016-04-25 NOTE — ED Provider Notes (Signed)
Emergency Department Provider Note   I have reviewed the triage vital signs and the nursing notes.   HISTORY  Chief Complaint Generalized Body Aches   HPI: [name] is a 66 y.o. male with a hx of chronic back pain, who presents to the Emergency Department complaining of gradaully worsening, generalized pain that began a few weeks ago. Patein states his pain has significantly worsened since yesterday. Patient states he could not get out of his recliner today due to pain. Patient has associated lower extremity swelling (left greater than the right), and constipation. Patient states he cannot cope. Patient states he takes morphine at home, nothing taken today. Patient reports going to a pain clinic. Patient states he quit smoking yesterday, smoked more than a pack a day. He denies any chest pain, trouble breathing, rhinorrhea, or headache.   Past Medical History:  Diagnosis Date  . Anxiety   . Aortic stenosis    Status post AVR 5/13  . Arthritis   . Atrial fibrillation ([place])   . Chronic back pain    Lumbar spondylosis  . Coronary atherosclerosis of native coronary artery    Nonobstructive  . Dyslipidemia   . Fibromyalgia   . GERD (gastroesophageal reflux disease)   . Hx of colonic polyps   . Hyperlipidemia   . Musab Wingard-term memory loss   . Major depressive disorder    Prior suicidal attempts  . Nocturia   . Peripheral vascular disease ([place])   . Seizures ([place])    6 yrs ago from withdrawal of Xanax to quickly  . Stroke ([place])   . Thoracic aortic aneurysm [facility])    Status post repair 7/13  . Tinnitus    Following prior seizures    Patient Active Problem List   Diagnosis Date Noted  . Encounter for therapeutic drug monitoring 04/30/2013  . Palpitations 11/23/2012  . Chest pain 07/23/2012  . Essential hypertension, benign 02/20/2012  . Aortic aneurysm, including pseudoaneurysm ([place])   . Atrial fibrillation ([place]) 09/11/2011  . Mahiya Kercheval term (current) use of anticoagulants  08/30/2011  . S/P AVR 08/28/2011  . Coronary atherosclerosis of native coronary artery   . Aortic stenosis   . DYSPNEA 03/28/2009  . HYPERLIPIDEMIA-MIXED 12/19/2008    Past Surgical History:  Procedure Laterality Date  . AORTIC VALVE REPLACEMENT  08/15/2011   Procedure: AORTIC VALVE REPLACEMENT (AVR);  Surgeon: [name], MD;  Location: [facility];  Service: Open Heart Surgery;  Laterality: N/A;  . APPENDECTOMY    . CARDIAC CATHETERIZATION    . CHOLECYSTECTOMY     [facility]  . CIRCUMCISION    . COLONOSCOPY    . ESOPHAGOGASTRODUODENOSCOPY  04/07/08/13  . FEMORAL HERNIA REPAIR     x2  . FINGER CONTRACTURE RELEASE     Tendon release operation on his right little finger  . INCISIONAL HERNIA REPAIR  05/01/2011   Procedure: HERNIA REPAIR INCISIONAL;  Surgeon: [name], MD;  Location: [facility];  Service: General;  Laterality: N/A;  with Mesh  . INGUINAL EXPLORATION     Right  . MEDIASTINAL EXPLORATION  10/08/2011   Procedure: MEDIASTINAL EXPLORATION;  Surgeon: [name], MD;  Location: [facility];  Service: Open Heart Surgery;  Laterality: N/A;  . STERNOTOMY  10/07/2011   Procedure: STERNOTOMY;  Surgeon: [name], MD;  Location: [facility];  Service: Open Heart Surgery;  Laterality: N/A;  . THORACIC AORTIC ANEURYSM REPAIR  10/07/2011   Procedure: THORACIC ASCENDING ANEURYSM REPAIR (AAA);  Surgeon:  [name], MD;  Location: [facility];  Service: Open Heart Surgery;  Laterality: N/A;  . TONSILLECTOMY     at age 33  . UMBILICAL HERNIA REPAIR     [place]    Current Outpatient Rx  . Order #: [id number] Class: Historical Med  . Order #: [id number] Class: Historical Med  . Order #: [id number] Class: Historical Med  . Order #: [id number] Class: Historical Med  . Order #: [id number] Class: Historical Med  . Order #: [id number] Class: Historical Med  . Order #: [id number] Class: Historical Med  . Order #: [id number] Class: Historical Med  . Order #: [id number] Class: Historical Med  . Order  #: [id number] Class: Historical Med  . Order #: [id number] Class: Normal  . Order #: [id number] Class: Normal  . Order #: [id number] Class: Historical Med  . Order #: [id number] Class: Historical Med  . Order #: [id number] Class: Historical Med  . Order #: [id number] Class: Historical Med  . Order #: [id number] Class: Fax  . Order #: [id number] Class: Historical Med  . Order #: [id number] Class: Historical Med  . Order #: [id number] Class: Historical Med    Allergies Bupropion and Atorvastatin  Family History  Problem Relation Age of Onset  . Cancer    . Stroke    . Anesthesia problems Neg Hx   . Hypotension Neg Hx   . Malignant hyperthermia Neg Hx   . Pseudochol deficiency Neg Hx     Social History Social History  Substance Use Topics  . Smoking status: Current Every Day Smoker    Packs/day: 0.50    Years: 48.00    Types: Cigarettes    Start date: 11/08/1962  . Smokeless tobacco: Never Used     Comment: 1/2 pack per day   . Alcohol use No     Comment: quit 24yr ago    Review of Systems Constitutional: No fever/chills Eyes: No visual changes. ENT: No sore throat. Cardiovascular: Denies chest pain. Respiratory: Denies shortness of breath. Gastrointestinal: No abdominal pain.  No nausea, no vomiting.  No diarrhea.  Positive for constipation. Genitourinary: Negative for dysuria. Musculoskeletal: Negative for back pain. Skin: Negative for rash. Neurological: Negative for headaches, focal weakness or numbness.  10-point ROS otherwise negative.  ____________________________________________   PHYSICAL EXAM:  VITAL SIGNS: ED Triage Vitals  Enc Vitals Group     BP 04/25/16 1231 140/71     Pulse Rate 04/25/16 1231 108     Resp 04/25/16 1231 20     Temp 04/25/16 1231 99.1 F (37.3 C)     Temp Source 04/25/16 1231 Oral     SpO2 04/25/16 1231 94 %     Weight 04/25/16 1231 164 lb (74.4 kg)     Height 04/25/16 1231 6' (1.829 m)     Pain Score 04/25/16 1230 5   Constitutional: Drowsy but  conversational. Well appearing and in no acute distress. Eyes: Conjunctivae are normal.  Head: Atraumatic. Nose: No congestion/rhinnorhea. Mouth/Throat: Mucous membranes are dry.  Oropharynx non-erythematous. Neck: No stridor.   Cardiovascular: Normal rate, regular rhythm. Good peripheral circulation. Grossly normal heart sounds.   Respiratory: Normal respiratory effort.  No retractions. Lungs CTAB. Gastrointestinal: Soft and nontender. No distention.  Musculoskeletal: Bilateral LE pitting edema with diffuse tenderness. No gross deformities of extremities. Neurologic:  Normal speech and language. No gross focal neurologic deficits are appreciated.  Skin:  Skin is warm, dry and intact. No rash noted.  ____________________________________________   LABS (all labs ordered are listed, but only abnormal results are displayed)  Labs Reviewed  CBC WITH DIFFERENTIAL/PLATELET - Abnormal;  Notable for the following:       Result Value   RBC 3.89 (*)    Hemoglobin 11.9 (*)    HCT 36.7 (*)    RDW 15.6 (*)    Platelets 106 (*)    All other components within normal limits  COMPREHENSIVE METABOLIC PANEL - Abnormal; Notable for the following:    Potassium 3.3 (*)    Calcium 8.6 (*)    ALT 11 (*)    GFR calc non Af Amer 60 (*)    All other components within normal limits  PROTIME-INR - Abnormal; Notable for the following:    Prothrombin Time 18.3 (*)    All other components within normal limits  BRAIN NATRIURETIC PEPTIDE - Abnormal; Notable for the following:    B Natriuretic Peptide 214.0 (*)    All other components within normal limits  TROPONIN I - Abnormal; Notable for the following:    Troponin I 0.23 (*)    All other components within normal limits  TROPONIN I - Abnormal; Notable for the following:    Troponin I 0.20 (*)    All other components within normal limits  LACTIC ACID, PLASMA  LACTIC ACID, PLASMA  URINALYSIS, ROUTINE W REFLEX MICROSCOPIC  INFLUENZA PANEL BY PCR (TYPE A &  B)   ____________________________________________  EKG   EKG Interpretation  Date/Time:  Thursday April 25 2016 16:14:54 EST Ventricular Rate:  110 PR Interval:    QRS Duration: 149 QT Interval:  375 QTC Calculation: 508 R Axis:   -15 Text Interpretation:  Sinus tachycardia Ventricular trigeminy Right bundle branch block No STEMI. Similar to prior.  Confirmed by Kaydin Labo MD, [place] [phone]) on 04/25/2016 4:39:26 PM       ____________________________________________  RADIOLOGY  Dg Chest 2 View  Result Date: 04/25/2016 CLINICAL DATA:  Productive cough, chest pain. EXAM: CHEST  2 VIEW COMPARISON:  Radiograph of February 24, 2016. FINDINGS: Stable cardiomegaly. Status post aortic valve repair. No pneumothorax or pleural effusion is noted. Stable mild central pulmonary vascular congestion is noted. Bony thorax is unremarkable. IMPRESSION: Stable cardiomegaly and mild central pulmonary vascular congestion. Electronically Signed   By: Marijo Conception, M.D.   On: 04/25/2016 13:22    ____________________________________________   PROCEDURES  Procedure(s) performed:   Procedures  None ____________________________________________   INITIAL IMPRESSION / ASSESSMENT AND PLAN / ED COURSE  Pertinent labs & imaging results that were available during my care of the patient were reviewed by me and considered in my medical decision making (see chart for details).  Patient presents to the emergency department for evaluation of total body pain, severe fatigue, and bilateral lower extremity edema. Patient is noted to have tachycardia and mild hypoxemia on arrival. He seems drowsy. He is on morphine as prescribed by his pain clinic. Lungs with diminished sounds at the bases. No wheezing. Plan for EKG, labs, and reassessment. Patient does have mild edema on chest x-ray which could be contributing to his symptoms especially in the setting of worsening lower external edema. Patient lives alone and  was unable to get out of chair so called EMS.   04:11 PM Alert at that the patient's troponin is elevated to 0.23. Patient is asymptomatic with no acute changes on EKG. I Kinzi Frediani discussion with the patient. He states he is feeling better and would like to go home. I repeated that his heart is under significant strain is likely having a heart attack given his elevated troponin despite the fact he is not  having chest pain or difficulty breathing. He is also having some tachycardia. Patient is refusing admission to the hospital despite her conversation outlining that this could lead to significant morbidity and mortality. The patient lives alone and unable to care for himself at home. Patient is insistent about signing out AMA.   05:17 PM Patient has removed all leads and is sitting on the edge of the bed.   05:30 PM Spoke with Dr. Oval Linsey with Cardiology who agrees for admission and enzyme trending. I went to discuss the updated with the patient. He had thrown his urine all around the room and said that his friend was on their way to pick him up. I discussed further imaging including a CT scan of his chest to rule out pulmonary embolism. I also discussed that his elevated troponin may be related to the valve dysfunction especially given his worsening swelling in his legs. Patient refuses additional testing or admission and intends to leave [place] once his friend gets here. Got the patient a blanket and will discuss with him further if/when his ride arrives.   06:20 PM Patient with no ride home at bedside. Again states that he is feeling better and intends to leave AMA. Monitor leads remain off. Patient awake and alert. No acute distress.   Patient's family member, nephew, at bedside. Discussed the case with the patient in front of family member. Patient will leave at this time. Encouraged him to please return at any time for treatment and that we would welcome him back immediately. Patient  is unwilling to change his mind. I feel that the patient has the capacity to make this decision for himself.  ____________________________________________  FINAL CLINICAL IMPRESSION(S) / ED DIAGNOSES  Final diagnoses:  NSTEMI (non-ST elevated myocardial infarction) ([place])  Edema, unspecified type  Tachycardia     MEDICATIONS GIVEN DURING THIS VISIT:  Medications  sodium chloride 0.9 % bolus 500 mL (0 mLs Intravenous Stopped 04/25/16 1435)  aspirin chewable tablet 324 mg (324 mg Oral Given 04/25/16 1835)     NEW OUTPATIENT MEDICATIONS STARTED DURING THIS VISIT:  None   Note:  This document was prepared using Dragon voice recognition software and may include unintentional dictation errors.  Nanda Quinton, MD Emergency Medicine  By signing my name below, I, Collene Leyden, attest that this documentation has been prepared under the direction and in the presence of Margette Fast, MD. Electronically Signed: Collene Leyden, Scribe. 04/25/16. 1:25 PM.    Margette Fast, MD 04/25/16 2042

## 2016-04-28 ENCOUNTER — Emergency Department (HOSPITAL_COMMUNITY): Payer: Medicare HMO

## 2016-04-28 ENCOUNTER — Encounter (HOSPITAL_COMMUNITY): Payer: Self-pay | Admitting: Emergency Medicine

## 2016-04-28 ENCOUNTER — Inpatient Hospital Stay (HOSPITAL_COMMUNITY)
Admission: EM | Admit: 2016-04-28 | Discharge: 2016-05-02 | DRG: 313 | Disposition: A | Discharge status: Home Health | Payer: Medicare HMO | Attending: Family Medicine | Admitting: Family Medicine

## 2016-04-28 DIAGNOSIS — I739 Peripheral vascular disease, unspecified: Secondary | ICD-10-CM | POA: Diagnosis present

## 2016-04-28 DIAGNOSIS — I350 Nonrheumatic aortic (valve) stenosis: Secondary | ICD-10-CM | POA: Diagnosis present

## 2016-04-28 DIAGNOSIS — R531 Weakness: Secondary | ICD-10-CM | POA: Diagnosis present

## 2016-04-28 DIAGNOSIS — I4891 Unspecified atrial fibrillation: Secondary | ICD-10-CM | POA: Diagnosis present

## 2016-04-28 DIAGNOSIS — R079 Chest pain, unspecified: Secondary | ICD-10-CM | POA: Diagnosis present

## 2016-04-28 DIAGNOSIS — R262 Difficulty in walking, not elsewhere classified: Secondary | ICD-10-CM | POA: Diagnosis present

## 2016-04-28 DIAGNOSIS — D6489 Other specified anemias: Secondary | ICD-10-CM | POA: Diagnosis present

## 2016-04-28 DIAGNOSIS — R600 Localized edema: Secondary | ICD-10-CM

## 2016-04-28 DIAGNOSIS — R7989 Other specified abnormal findings of blood chemistry: Secondary | ICD-10-CM

## 2016-04-28 DIAGNOSIS — E785 Hyperlipidemia, unspecified: Secondary | ICD-10-CM | POA: Diagnosis present

## 2016-04-28 DIAGNOSIS — Z888 Allergy status to other drugs, medicaments and biological substances status: Secondary | ICD-10-CM

## 2016-04-28 DIAGNOSIS — R413 Other amnesia: Secondary | ICD-10-CM | POA: Diagnosis present

## 2016-04-28 DIAGNOSIS — M6281 Muscle weakness (generalized): Secondary | ICD-10-CM

## 2016-04-28 DIAGNOSIS — I951 Orthostatic hypotension: Secondary | ICD-10-CM

## 2016-04-28 DIAGNOSIS — E039 Hypothyroidism, unspecified: Secondary | ICD-10-CM | POA: Diagnosis present

## 2016-04-28 DIAGNOSIS — I214 Non-ST elevation (NSTEMI) myocardial infarction: Secondary | ICD-10-CM | POA: Diagnosis not present

## 2016-04-28 DIAGNOSIS — R0902 Hypoxemia: Secondary | ICD-10-CM

## 2016-04-28 DIAGNOSIS — Z952 Presence of prosthetic heart valve: Secondary | ICD-10-CM

## 2016-04-28 DIAGNOSIS — Z79899 Other long term (current) drug therapy: Secondary | ICD-10-CM

## 2016-04-28 DIAGNOSIS — R008 Other abnormalities of heart beat: Secondary | ICD-10-CM | POA: Diagnosis present

## 2016-04-28 DIAGNOSIS — M79605 Pain in left leg: Secondary | ICD-10-CM | POA: Diagnosis not present

## 2016-04-28 DIAGNOSIS — I480 Paroxysmal atrial fibrillation: Secondary | ICD-10-CM | POA: Diagnosis present

## 2016-04-28 DIAGNOSIS — I509 Heart failure, unspecified: Secondary | ICD-10-CM | POA: Diagnosis not present

## 2016-04-28 DIAGNOSIS — Z66 Do not resuscitate: Secondary | ICD-10-CM | POA: Diagnosis present

## 2016-04-28 DIAGNOSIS — M549 Dorsalgia, unspecified: Secondary | ICD-10-CM

## 2016-04-28 DIAGNOSIS — E876 Hypokalemia: Secondary | ICD-10-CM | POA: Diagnosis present

## 2016-04-28 DIAGNOSIS — F329 Major depressive disorder, single episode, unspecified: Secondary | ICD-10-CM | POA: Diagnosis present

## 2016-04-28 DIAGNOSIS — I2510 Atherosclerotic heart disease of native coronary artery without angina pectoris: Secondary | ICD-10-CM | POA: Diagnosis present

## 2016-04-28 DIAGNOSIS — R0789 Other chest pain: Secondary | ICD-10-CM | POA: Diagnosis not present

## 2016-04-28 DIAGNOSIS — I499 Cardiac arrhythmia, unspecified: Secondary | ICD-10-CM

## 2016-04-28 DIAGNOSIS — K219 Gastro-esophageal reflux disease without esophagitis: Secondary | ICD-10-CM | POA: Diagnosis present

## 2016-04-28 DIAGNOSIS — M545 Low back pain: Secondary | ICD-10-CM | POA: Diagnosis present

## 2016-04-28 DIAGNOSIS — Z8673 Personal history of transient ischemic attack (TIA), and cerebral infarction without residual deficits: Secondary | ICD-10-CM

## 2016-04-28 DIAGNOSIS — G8929 Other chronic pain: Secondary | ICD-10-CM | POA: Diagnosis present

## 2016-04-28 DIAGNOSIS — Z7901 Long term (current) use of anticoagulants: Secondary | ICD-10-CM

## 2016-04-28 DIAGNOSIS — D649 Anemia, unspecified: Secondary | ICD-10-CM | POA: Diagnosis present

## 2016-04-28 DIAGNOSIS — I10 Essential (primary) hypertension: Secondary | ICD-10-CM | POA: Diagnosis not present

## 2016-04-28 DIAGNOSIS — Z823 Family history of stroke: Secondary | ICD-10-CM

## 2016-04-28 DIAGNOSIS — Z79891 Long term (current) use of opiate analgesic: Secondary | ICD-10-CM

## 2016-04-28 DIAGNOSIS — M5489 Other dorsalgia: Secondary | ICD-10-CM | POA: Diagnosis not present

## 2016-04-28 DIAGNOSIS — I82403 Acute embolism and thrombosis of unspecified deep veins of lower extremity, bilateral: Secondary | ICD-10-CM | POA: Diagnosis present

## 2016-04-28 DIAGNOSIS — R0602 Shortness of breath: Secondary | ICD-10-CM

## 2016-04-28 DIAGNOSIS — R778 Other specified abnormalities of plasma proteins: Secondary | ICD-10-CM | POA: Diagnosis present

## 2016-04-28 DIAGNOSIS — M79604 Pain in right leg: Secondary | ICD-10-CM | POA: Diagnosis not present

## 2016-04-28 DIAGNOSIS — Z87891 Personal history of nicotine dependence: Secondary | ICD-10-CM

## 2016-04-28 DIAGNOSIS — I498 Other specified cardiac arrhythmias: Secondary | ICD-10-CM

## 2016-04-28 HISTORY — DX: Anemia, unspecified: D64.9

## 2016-04-28 HISTORY — DX: Pain, unspecified: R52

## 2016-04-28 HISTORY — DX: Paroxysmal atrial fibrillation: I48.0

## 2016-04-28 HISTORY — DX: Hypotension, unspecified: I95.9

## 2016-04-28 HISTORY — DX: Orthostatic hypotension: I95.1

## 2016-04-28 LAB — CBC WITH DIFFERENTIAL/PLATELET
BASOS PCT: 0 %
Basophils Absolute: 0 10*3/uL (ref 0.0–0.1)
EOS ABS: 0 10*3/uL (ref 0.0–0.7)
Eosinophils Relative: 1 %
HEMATOCRIT: 33.8 % — AB (ref 39.0–52.0)
HEMOGLOBIN: 10.9 g/dL — AB (ref 13.0–17.0)
LYMPHS ABS: 0.9 10*3/uL (ref 0.7–4.0)
Lymphocytes Relative: 13 %
MCH: 29.9 pg (ref 26.0–34.0)
MCHC: 32.2 g/dL (ref 30.0–36.0)
MCV: 92.9 fL (ref 78.0–100.0)
Monocytes Absolute: 0.5 10*3/uL (ref 0.1–1.0)
Monocytes Relative: 8 %
NEUTROS ABS: 5.5 10*3/uL (ref 1.7–7.7)
NEUTROS PCT: 78 %
Platelets: 150 10*3/uL (ref 150–400)
RBC: 3.64 MIL/uL — AB (ref 4.22–5.81)
RDW: 15.5 % (ref 11.5–15.5)
WBC: 7 10*3/uL (ref 4.0–10.5)

## 2016-04-28 LAB — BASIC METABOLIC PANEL
ANION GAP: 9 (ref 5–15)
BUN: 15 mg/dL (ref 6–20)
CHLORIDE: 106 mmol/L (ref 101–111)
CO2: 22 mmol/L (ref 22–32)
Calcium: 8 mg/dL — ABNORMAL LOW (ref 8.9–10.3)
Creatinine, Ser: 1.04 mg/dL (ref 0.61–1.24)
GFR calc Af Amer: >60 mL/min (ref >60)
GFR calc non Af Amer: >60 mL/min (ref >60)
GLUCOSE: 77 mg/dL (ref 65–99)
POTASSIUM: 3.2 mmol/L — AB (ref 3.5–5.1)
Sodium: 137 mmol/L (ref 135–145)

## 2016-04-28 LAB — RAPID URINE DRUG SCREEN, HOSP PERFORMED
Amphetamines: NOT DETECTED
BARBITURATES: NOT DETECTED
Benzodiazepines: POSITIVE — AB
Cocaine: NOT DETECTED
Opiates: POSITIVE — AB
Tetrahydrocannabinol: NOT DETECTED

## 2016-04-28 LAB — URINALYSIS, ROUTINE W REFLEX MICROSCOPIC
BACTERIA UA: NONE SEEN
BILIRUBIN URINE: NEGATIVE
Glucose, UA: NEGATIVE mg/dL
Ketones, ur: 20 mg/dL — AB
Leukocytes, UA: NEGATIVE
NITRITE: NEGATIVE
PROTEIN: 30 mg/dL — AB
SPECIFIC GRAVITY, URINE: 1.016 (ref 1.005–1.030)
pH: 6 (ref 5.0–8.0)

## 2016-04-28 LAB — TROPONIN I
TROPONIN I: 0.06 ng/mL — AB (ref <0.03)
Troponin I: 0.06 ng/mL (ref <0.03)

## 2016-04-28 LAB — PROTIME-INR
INR: 1.39
PROTHROMBIN TIME: 17.2 s — AB (ref 11.4–15.2)

## 2016-04-28 LAB — TSH: TSH: 1.287 u[IU]/mL (ref 0.350–4.500)

## 2016-04-28 LAB — MAGNESIUM: Magnesium: 2.3 mg/dL (ref 1.7–2.4)

## 2016-04-28 LAB — BRAIN NATRIURETIC PEPTIDE: B Natriuretic Peptide: 443 pg/mL — ABNORMAL HIGH (ref 0.0–100.0)

## 2016-04-28 MED ORDER — WARFARIN - PHARMACIST DOSING INPATIENT
Status: DC
  Administered 2016-04-29 – 2016-05-01 (×3)

## 2016-04-28 MED ORDER — GABAPENTIN 300 MG PO CAPS
600.0000 mg | ORAL_CAPSULE | Freq: Three times a day (TID) | ORAL | Status: DC
  Administered 2016-04-28: 600 mg via ORAL
  Filled 2016-04-28: qty 2

## 2016-04-28 MED ORDER — SODIUM CHLORIDE 0.9% FLUSH: 3.0000 mL | Freq: Two times a day (BID) | INTRAVENOUS | Status: DC

## 2016-04-28 MED ORDER — ATORVASTATIN CALCIUM 40 MG PO TABS
80.0000 mg | ORAL_TABLET | Freq: Every day | ORAL | Status: DC
  Administered 2016-04-29 – 2016-05-02 (×4): 80 mg via ORAL
  Filled 2016-04-28 (×4): qty 2

## 2016-04-28 MED ORDER — WARFARIN SODIUM 5 MG PO TABS
10.0000 mg | ORAL_TABLET | ORAL | Status: AC
  Administered 2016-04-28: 10 mg via ORAL
  Filled 2016-04-28: qty 2

## 2016-04-28 MED ORDER — BIOTENE DRY MOUTH MT LIQD
15.0000 mL | Freq: Three times a day (TID) | OROMUCOSAL | Status: DC
  Administered 2016-04-28 – 2016-05-01 (×8): 15 mL via OROMUCOSAL

## 2016-04-28 MED ORDER — ASPIRIN EC 81 MG PO TBEC
81.0000 mg | DELAYED_RELEASE_TABLET | Freq: Every day | ORAL | Status: DC
  Administered 2016-04-29 – 2016-05-02 (×4): 81 mg via ORAL
  Filled 2016-04-28 (×4): qty 1

## 2016-04-28 MED ORDER — ASPIRIN 81 MG PO CHEW
324.0000 mg | CHEWABLE_TABLET | Freq: Once | ORAL | Status: AC
  Administered 2016-04-28: 324 mg via ORAL
  Filled 2016-04-28: qty 4

## 2016-04-28 MED ORDER — BACLOFEN 10 MG PO TABS
10.0000 mg | ORAL_TABLET | Freq: Three times a day (TID) | ORAL | Status: DC
  Administered 2016-04-28: 10 mg via ORAL
  Filled 2016-04-28: qty 1

## 2016-04-28 MED ORDER — ACETAMINOPHEN 325 MG PO TABS
650.0000 mg | ORAL_TABLET | ORAL | Status: DC | PRN
  Administered 2016-04-30: 650 mg via ORAL
  Filled 2016-04-28: qty 2

## 2016-04-28 MED ORDER — POTASSIUM CHLORIDE ER 10 MEQ PO TBCR
10.0000 meq | EXTENDED_RELEASE_TABLET | Freq: Every day | ORAL | Status: DC
  Administered 2016-04-28: 10 meq via ORAL
  Filled 2016-04-28 (×3): qty 1

## 2016-04-28 MED ORDER — DULOXETINE HCL 60 MG PO CPEP
60.0000 mg | ORAL_CAPSULE | Freq: Every day | ORAL | Status: DC
  Administered 2016-04-28 – 2016-05-02 (×5): 60 mg via ORAL
  Filled 2016-04-28 (×5): qty 1

## 2016-04-28 MED ORDER — ONDANSETRON HCL 4 MG/2ML IJ SOLN
4.0000 mg | Freq: Four times a day (QID) | INTRAMUSCULAR | Status: DC | PRN
  Administered 2016-04-29 – 2016-05-01 (×2): 4 mg via INTRAVENOUS
  Filled 2016-04-28 (×2): qty 2

## 2016-04-28 MED ORDER — BIOTENE MOISTURIZING MOUTH MT SOLN: 1.0000 | Freq: Three times a day (TID) | OROMUCOSAL | Status: DC

## 2016-04-28 MED ORDER — TRAZODONE HCL 50 MG PO TABS
50.0000 mg | ORAL_TABLET | Freq: Every evening | ORAL | Status: DC | PRN
  Administered 2016-04-28: 50 mg via ORAL
  Filled 2016-04-28: qty 1

## 2016-04-28 MED ORDER — METOPROLOL TARTRATE 25 MG PO TABS
25.0000 mg | ORAL_TABLET | Freq: Three times a day (TID) | ORAL | Status: DC
  Administered 2016-04-28 – 2016-05-02 (×8): 25 mg via ORAL
  Filled 2016-04-28 (×11): qty 1

## 2016-04-28 MED ORDER — ALPRAZOLAM 1 MG PO TABS
1.0000 mg | ORAL_TABLET | Freq: Four times a day (QID) | ORAL | Status: DC | PRN
  Administered 2016-04-28: 1 mg via ORAL
  Filled 2016-04-28: qty 1

## 2016-04-28 MED ORDER — SODIUM CHLORIDE 0.9% FLUSH: 3.0000 mL | INTRAVENOUS | Status: DC | PRN

## 2016-04-28 MED ORDER — SODIUM CHLORIDE 0.9 % IV SOLN
INTRAVENOUS | Status: DC
  Administered 2016-04-28: 1000 mL via INTRAVENOUS

## 2016-04-28 MED ORDER — PANTOPRAZOLE SODIUM 40 MG PO TBEC
40.0000 mg | DELAYED_RELEASE_TABLET | Freq: Every day | ORAL | Status: DC
  Administered 2016-04-28 – 2016-05-02 (×5): 40 mg via ORAL
  Filled 2016-04-28 (×5): qty 1

## 2016-04-28 MED ORDER — SODIUM CHLORIDE 0.9 % IV SOLN: 250.0000 mL | INTRAVENOUS | Status: DC | PRN

## 2016-04-28 MED ORDER — WARFARIN SODIUM 5 MG PO TABS: 2.5000 mg | ORAL_TABLET | ORAL | Status: DC

## 2016-04-28 MED ORDER — FUROSEMIDE 10 MG/ML IJ SOLN
20.0000 mg | Freq: Once | INTRAMUSCULAR | Status: AC
  Administered 2016-04-28: 20 mg via INTRAVENOUS
  Filled 2016-04-28: qty 2

## 2016-04-28 MED ORDER — MORPHINE SULFATE 15 MG PO TABS
15.0000 mg | ORAL_TABLET | Freq: Four times a day (QID) | ORAL | Status: DC | PRN
  Administered 2016-04-28: 15 mg via ORAL
  Filled 2016-04-28: qty 1

## 2016-04-28 MED ORDER — NITROGLYCERIN 0.4 MG SL SUBL: 0.4000 mg | SUBLINGUAL_TABLET | SUBLINGUAL | Status: DC | PRN

## 2016-04-28 MED ORDER — FOLIC ACID 1 MG PO TABS
1.0000 mg | ORAL_TABLET | Freq: Every day | ORAL | Status: DC
  Administered 2016-04-28 – 2016-05-02 (×5): 1 mg via ORAL
  Filled 2016-04-28 (×5): qty 1

## 2016-04-28 MED ORDER — LEVOTHYROXINE SODIUM 100 MCG PO TABS
100.0000 ug | ORAL_TABLET | Freq: Every day | ORAL | Status: DC
  Administered 2016-04-30 – 2016-05-02 (×3): 100 ug via ORAL
  Filled 2016-04-28 (×4): qty 1

## 2016-04-28 MED ORDER — POTASSIUM CHLORIDE CRYS ER 20 MEQ PO TBCR
40.0000 meq | EXTENDED_RELEASE_TABLET | Freq: Once | ORAL | Status: AC
  Administered 2016-04-28: 40 meq via ORAL
  Filled 2016-04-28: qty 2

## 2016-04-28 MED ORDER — LISINOPRIL 5 MG PO TABS
2.5000 mg | ORAL_TABLET | Freq: Every day | ORAL | Status: DC
  Administered 2016-04-28 – 2016-05-02 (×5): 2.5 mg via ORAL
  Filled 2016-04-28 (×5): qty 1

## 2016-04-28 NOTE — ED Notes (Signed)
CRITICAL VALUE ALERT  Critical value received:  Troponin 0.06  Date of notification:  04/28/2016  Time of notification:  [id number]  Critical value read back:Yes.    Nurse who received alert:  Domenica Reamer RN   MD notified (1st page):  Dr Thurnell Garbe  Time of first page:  1752  MD notified (2nd page):  Time of second page:  Responding MD:  Dr Thurnell Garbe  Time MD responded:  [phone]

## 2016-04-28 NOTE — ED Notes (Signed)
Orthostatic VS done with x2 assist. Patient assisted back to the stretcher. Patient having head pain at this time. RN aware.

## 2016-04-28 NOTE — H&P (Signed)
History and Physical    [name] [id number] DOB: 30-Jun-1950 DOA: 04/28/2016  PCP: Gar Ponto, MD Consultants:  Cardiology-  Domenic Polite; Pain management in [place] Patient coming from: home - lives alone; NOK: "nobody"  Chief Complaint: chest pain  HPI: [name] is a 66 y.o. male with medical history significant of CVA, PVD, chronic pain, MDD, HLD, CAD, afib, and s/p AVR on anticoagulation presenting with "I need help, I need to get well.  My abs, I can't walk, from my hips all the way down to my feet, they hurt, and I can't walk.  My heart's hurting."  Reports chest pain for a couple of hours.  Nothing to eat/drink for 6 days, unable to get out of recliner.  Stays in the recliner all the time.  Has not considered placement.  Was able to drive a week ago.  Was in ER 3 days ago and refused admission, "I just want to go home and die."  Very depressed.  Hasn't had depression medicine in a long time now - reports that he takes Xanax for depression.  He was seen by Dr. Laverta Baltimore in the ER on 1/25 for "total body pain, severe fatigue, and bilateral lower extremity edema."  His troponin was 0.23 with no acute changes on EKG.  The patient refused admission; threw his urine around the room; removed his telemetry; and eventually signed out AMA.   ED Course: Per Dr. Thurnell Garbe: [id number]: BNP elevated with edema on CXR (doubt pneumonia without fever or elevated WBC count); small dose of IV lasix given due to orthostasis on VS. Potassium repleted PO. Troponin trending downward from 3 days ago. Pt agrees to stay for admission today. T/C to [facility]. Lorin Mercy, case discussed, including: HPI, pertinent PM/SHx, VS/PE, dx testing, ED course and treatment: Agreeable to admit, requests to write temporary orders, obtain tele bed to team [facility].    Review of Systems: As per HPI; otherwise 10 point review of systems reviewed and negative.   Ambulatory Status:  Currently unable to ambulate  Past Medical History:    Diagnosis Date  . Anxiety   . Aortic stenosis    Status post AVR 5/13  . Arthritis   . Atrial fibrillation ([place])   . Chronic back pain    Lumbar spondylosis  . Coronary atherosclerosis of native coronary artery    Nonobstructive  . Fibromyalgia   . GERD (gastroesophageal reflux disease)   . Hx of colonic polyps   . Hyperlipidemia   . Long-term memory loss   . Major depressive disorder    Prior suicidal attempts  . Nocturia   . Pain management   . Peripheral vascular disease ([place])   . Seizures ([place])    6 yrs ago from withdrawal of Xanax to quickly  . Stroke ([place])   . Thoracic aortic aneurysm [facility])    Status post repair 7/13  . Tinnitus    Following prior seizures    Past Surgical History:  Procedure Laterality Date  . AORTIC VALVE REPLACEMENT  08/15/2011   Procedure: AORTIC VALVE REPLACEMENT (AVR);  Surgeon: [name], MD;  Location: [place];  Service: Open Heart Surgery;  Laterality: N/A;  . APPENDECTOMY    . CARDIAC CATHETERIZATION    . CHOLECYSTECTOMY     [facility]  . CIRCUMCISION    . COLONOSCOPY    . ESOPHAGOGASTRODUODENOSCOPY  04/07/08/13  . FEMORAL HERNIA REPAIR     x2  . FINGER CONTRACTURE RELEASE     Tendon  release operation on his right little finger  . INCISIONAL HERNIA REPAIR  05/01/2011   Procedure: HERNIA REPAIR INCISIONAL;  Surgeon: [name], MD;  Location: [facility];  Service: General;  Laterality: N/A;  with Mesh  . INGUINAL EXPLORATION     Right  . MEDIASTINAL EXPLORATION  10/08/2011   Procedure: MEDIASTINAL EXPLORATION;  Surgeon: [name], MD;  Location: [place];  Service: Open Heart Surgery;  Laterality: N/A;  . STERNOTOMY  10/07/2011   Procedure: STERNOTOMY;  Surgeon: [name], MD;  Location: [place];  Service: Open Heart Surgery;  Laterality: N/A;  . THORACIC AORTIC ANEURYSM REPAIR  10/07/2011   Procedure: THORACIC ASCENDING ANEURYSM REPAIR (AAA);  Surgeon: [name], MD;  Location: [place];  Service: Open Heart  Surgery;  Laterality: N/A;  . TONSILLECTOMY     at age 36  . UMBILICAL HERNIA REPAIR     [place]    Social History   Social History  . Marital status: Single    Spouse name: N/A  . Number of children: N/A  . Years of education: N/A   Occupational History  . Disabled    Social History Main Topics  . Smoking status: Former Smoker    Packs/day: 0.50    Years: 48.00    Types: Cigarettes    Start date: 11/08/1962    Quit date: 04/21/2016  . Smokeless tobacco: Never Used     Comment: 1/2 pack per day   . Alcohol use No     Comment: quit 19yr ago  . Drug use: No  . Sexual activity: Yes    Birth control/ protection: None   Other Topics Concern  . Not on file   Social History Narrative  . No narrative on file    Allergies  Allergen Reactions  . Bupropion     ??  . Atorvastatin     ??    Family History  Problem Relation Age of Onset  . Cancer    . Stroke    . Anesthesia problems Neg Hx   . Hypotension Neg Hx   . Malignant hyperthermia Neg Hx   . Pseudochol deficiency Neg Hx     Prior to Admission medications   Medication Sig Start Date End Date Taking? Authorizing Provider  ALPRAZolam Duanne Moron) 1 MG tablet Take 1 mg by mouth 4 (four) times daily as needed for anxiety.  09/30/11   Historical Provider, MD  Artificial Saliva (BIOTENE MOISTURIZING MOUTH) SOLN Use as directed 1-2 sprays in the mouth or throat 3 (three) times daily.    Historical Provider, MD  atorvastatin (LIPITOR) 80 MG tablet Take 80 mg by mouth daily.    Historical Provider, MD  baclofen (LIORESAL) 10 MG tablet Take 10 mg by mouth 3 (three) times daily.    Historical Provider, MD  ciprofloxacin (CIPRO) 500 MG tablet Take 500 mg by mouth 2 (two) times daily. [place] ON 04/15/2016    Historical Provider, MD  DULoxetine (CYMBALTA) 60 MG capsule Take 60 mg by mouth daily.    Historical Provider, MD  folic acid (FOLVITE) 1 MG tablet Take 1 mg by mouth daily.    Historical Provider, MD    furosemide (LASIX) 20 MG tablet Take 20 mg by mouth 2 (two) times daily.    Historical Provider, MD  gabapentin (NEURONTIN) 300 MG capsule Take 600 mg by mouth 3 (three) times daily.    Historical Provider, MD  levothyroxine (SYNTHROID, LEVOTHROID)  100 MCG tablet Take 100 mcg by mouth daily before breakfast.    Historical Provider, MD  lisinopril (PRINIVIL,ZESTRIL) 2.5 MG tablet Take 1 tablet (2.5 mg total) by mouth daily. 12/05/15 04/25/16  Satira Sark, MD  metoprolol tartrate (LOPRESSOR) 25 MG tablet TAKE ONE TABLET BY MOUTH THREE TIMES DAILY (MORNING, NOON, & IN THE EVENING) 03/18/16   Satira Sark, MD  metroNIDAZOLE (FLAGYL) 500 MG tablet Take 500 mg by mouth 2 (two) times daily. [place] ON 04/15/2016    Historical Provider, MD  morphine (MSIR) 15 MG tablet Take 15 mg by mouth every 6 (six) hours.    Historical Provider, MD  NITROSTAT 0.4 MG SL tablet Place 0.4 mg under the tongue every 5 (five) minutes as needed for chest pain.  07/11/12   Historical Provider, MD  pantoprazole (PROTONIX) 40 MG tablet Take 40 mg by mouth daily.      Historical Provider, MD  potassium chloride (K-DUR) 10 MEQ tablet Take 1 tablet (10 mEq total) by mouth daily. 12/17/13   Satira Sark, MD  promethazine (PHENERGAN) 25 MG tablet Take 25 mg by mouth every 6 (six) hours as needed for nausea or vomiting.     Historical Provider, MD  traZODone (DESYREL) 50 MG tablet Take 50 mg by mouth at bedtime as needed for sleep.    Historical Provider, MD  warfarin (COUMADIN) 5 MG tablet Take 2.5-5 mg by mouth See admin instructions. Alternate taking 2.5mg  with 5mg  every evening    Historical Provider, MD    Physical Exam: Vitals:   04/28/16 1830 04/28/16 1900 04/28/16 1930 04/28/16 2024  BP: 128/57 130/59 122/65 (!) 129/50  Pulse: (!) 32 92 98 97  Resp: 24 23 20    Temp:    98.8 F (37.1 C)  TempSrc:    Oral  SpO2: 93% 95% 94% 92%  Weight:    95 kg (209 lb 7 oz)  Height:    5\' 11"  (1.803 m)      General:  Appears calm and comfortable and is NAD; he is fairly somnolent and does not appear to be in pain or anxious but repeatedly asks to ensure that his pain and anxiety medications will be provided Eyes:  PERRL, EOMI, normal lids, iris ENT:  grossly normal hearing, lips & tongue quite dry, dry mm Neck:  no LAD, masses or thyromegaly Cardiovascular:  RRR, loud (4/6) click present. Non-pitting painful LE edema.  Respiratory:  CTA bilaterally, no w/r/r. Normal respiratory effort. Abdomen:  soft, ntnd, NABS Skin:  no rash or induration seen on limited exam Musculoskeletal:  grossly normal tone BUE/BLE, good ROM, no bony abnormality Psychiatric: flat affect, speech fluent and appropriate, AOx3 Neurologic:  CN 2-12 grossly intact, moves all extremities in coordinated fashion, sensation intact  Labs on Admission: I have personally reviewed following labs and imaging studies  CBC:  Recent Labs Lab 04/25/16 1310 04/28/16 1654  WBC 8.3 7.0  NEUTROABS 6.7 5.5  HGB 11.9* 10.9*  HCT 36.7* 33.8*  MCV 94.3 92.9  PLT 106* [id number]   Basic Metabolic Panel:  Recent Labs Lab 04/25/16 1310 04/28/16 1654  NA 138 137  K 3.3* 3.2*  CL 103 106  CO2 25 22  GLUCOSE 84 77  BUN 10 15  CREATININE 1.24 1.04  CALCIUM 8.6* 8.0*  MG  --  2.3   GFR: Estimated Creatinine Clearance: 84.4 mL/min (by C-G formula based on SCr of 1.04 mg/dL). Liver Function Tests:  Recent Labs Lab  04/25/16 1310  AST 26  ALT 11*  ALKPHOS 82  BILITOT 1.2  PROT 8.1  ALBUMIN 3.5   No results for input(s): LIPASE, AMYLASE in the last 168 hours. No results for input(s): AMMONIA in the last 168 hours. Coagulation Profile:  Recent Labs Lab 04/25/16 1324 04/28/16 1654  INR 1.50 1.39   Cardiac Enzymes:  Recent Labs Lab 04/25/16 1323 04/25/16 1630 04/28/16 1654  TROPONINI 0.23* 0.20* 0.06*   BNP (last 3 results) No results for input(s): PROBNP in the last 8760 hours. HbA1C: No results for  input(s): HGBA1C in the last 72 hours. CBG: No results for input(s): GLUCAP in the last 168 hours. Lipid Profile: No results for input(s): CHOL, HDL, LDLCALC, TRIG, CHOLHDL, LDLDIRECT in the last 72 hours. Thyroid Function Tests: No results for input(s): TSH, T4TOTAL, FREET4, T3FREE, THYROIDAB in the last 72 hours. Anemia Panel: No results for input(s): VITAMINB12, FOLATE, FERRITIN, TIBC, IRON, RETICCTPCT in the last 72 hours. Urine analysis:    Component Value Date/Time   COLORURINE YELLOW 04/28/2016 1856   APPEARANCEUR CLEAR 04/28/2016 1856   LABSPEC 1.016 04/28/2016 1856   PHURINE 6.0 04/28/2016 1856   GLUCOSEU NEGATIVE 04/28/2016 1856   HGBUR SMALL (A) 04/28/2016 1856   BILIRUBINUR NEGATIVE 04/28/2016 1856   KETONESUR 20 (A) 04/28/2016 1856   PROTEINUR 30 (A) 04/28/2016 1856   UROBILINOGEN 0.2 08/13/2011 1607   NITRITE NEGATIVE 04/28/2016 1856   LEUKOCYTESUR NEGATIVE 04/28/2016 1856    Creatinine Clearance: Estimated Creatinine Clearance: 84.4 mL/min (by C-G formula based on SCr of 1.04 mg/dL).  Sepsis Labs: @LABRCNTIP (procalcitonin:4,lacticidven:4) )No results found for this or any previous visit (from the past 240 hour(s)).   Radiological Exams on Admission: Dg Chest Port 1 View  Result Date: 04/28/2016 CLINICAL DATA:  Shortness of breath, weakness and bilateral lower extremity edema. EXAM: PORTABLE CHEST 1 VIEW COMPARISON:  04/25/2016 and prior chest radiographs FINDINGS: Cardiomegaly and aortic valve replacement changes noted. New mild interstitial opacities are suggestive of interstitial edema. Right upper lobe airspace disease may represent focal edema or pneumonia. Trace bilateral pleural effusions are noted. There is no evidence of pneumothorax or acute bony abnormality. IMPRESSION: Cardiomegaly with new mild interstitial opacities -question mild interstitial edema. Right upper lobe airspace disease-question focal edema versus pneumonia. Electronically Signed   By:  Margarette Canada M.D.   On: 04/28/2016 17:35    EKG: Independently reviewed.  NSR with rate 97; trigeminy, nonspecific ST changes with no evidence of acute ischemia  Assessment/Plan Principal Problem:   Chest pain Active Problems:   Hyperlipidemia   S/P AVR   Long term current use of anticoagulant therapy   Atrial fibrillation (HCC)   Essential hypertension, benign   Chest pain -Patient with vague symptoms of chest pain, previously in the ER and found to have an elevated troponin, concerning for NSTEMI.  -CXR with nonspecific findings, edema vs. PNA.  Does not have active URI symptoms. -Initial cardiac troponin minimally positive at 0.06, improved from 1/25.   -EKG not indicative of acute ischemia.   -TIMI risk score is 3; which predicts a 14 days risk of death, recurrent MI, or urgent revascularization of 13.2%.  -Will plan to place in observation status on telemetry to rule out ACS by overnight observation.  -cycle troponin q6h x 3 and repeat EKG in AM -ASA 81 mg daily -morphine given -statin continued -Will check TSH -Cardiology consultation in AM - NPO for possible stress test if things change overnight -Will plan to start Heparin drip  if enzymes are increasing and/or chest pain recurs -BNP 443; Echo ordered for tomorrow  HTN -Takes low-dose ACE and Lopressor at home -Patient with appropriate control while in the ER  HLD -Continue Lipitor 80 mg -Lipid panel ordered  Hypokalemia -K+ 3.2 -Given 71mEq KCl PO x 1 in ER -Will follow  Anemia -Hgb 10.9, stable  -s/p AVR -INR 1.39 -Coumadin consult per pharmacy  Chronic pain -UDS positive for BZD and opiates (as expected) -I have reviewed this patient in the [place] Controlled Substances Reporting System.  He is receiving his medications from two providers (1 for pain, 1 for Xanax) and appears to be taking them as prescribed.  DVT prophylaxis: Coumadin Code Status: DNR - confirmed with patient Family Communication: None  present Disposition Plan:  Home once clinically improved Consults called: Cardiology  Admission status: It is my clinical opinion that referral for OBSERVATION is reasonable and necessary in this patient based on the above information provided. The aforementioned taken together are felt to place the patient at high risk for further clinical deterioration. However it is anticipated that the patient may be medically stable for discharge from the hospital within 24 to 48 hours.    Karmen Bongo MD [facility]  If 7PM-7AM, please contact night-coverage [email or web address] Password [facility]  04/28/2016, 10:24 PM

## 2016-04-28 NOTE — ED Triage Notes (Signed)
Pt called EMS for SOB and BLE edema with generalized weakness. [id number] per EMS. Pt seen in ED 1/25 and left AMA.

## 2016-04-28 NOTE — ED Provider Notes (Signed)
[facility] DEPT Provider Note   CSN: [id number] Arrival date & time: 04/28/16  1644     History   Chief Complaint Chief Complaint  Patient presents with  . Shortness of Breath    HPI [name] is a 66 y.o. male.  HPI  Pt was seen at 1650. Per EMS and pt report, c/o gradual onset and worsening of persistent generalized weakness since his last ED visit 3 days ago. Pt also c/o "months" of chest "pains," SOB, and increasing pedal edema. Describes the CP as located in his left chest, worsens on exertion, lasts approximately 1hr per episode. Pt states he "just lays in my recliner 24/7" and "couldn't get out of it," so he came to the ED for evaluation. Pt was evaluated in the ED 3 days ago for his symptoms, dx NSTEMI, left AMA. Pt also states he "hasn't had anything to eat or drink in the past 5 weeks," but does endorse taking his meds as prescribed. Denies falls, no syncope, no focal motor weakness, no palpitations, no cough, no change in chronic back pain, no fevers.   Past Medical History:  Diagnosis Date  . Anxiety   . Aortic stenosis    Status post AVR 5/13  . Arthritis   . Atrial fibrillation ([place])   . Chronic back pain    Lumbar spondylosis  . Coronary atherosclerosis of native coronary artery    Nonobstructive  . Dyslipidemia   . Fibromyalgia   . GERD (gastroesophageal reflux disease)   . Hx of colonic polyps   . Hyperlipidemia   . Long-term memory loss   . Major depressive disorder    Prior suicidal attempts  . Nocturia   . Pain management   . Peripheral vascular disease ([place])   . Seizures ([place])    6 yrs ago from withdrawal of Xanax to quickly  . Stroke ([place])   . Thoracic aortic aneurysm [facility])    Status post repair 7/13  . Tinnitus    Following prior seizures    Patient Active Problem List   Diagnosis Date Noted  . Encounter for therapeutic drug monitoring 04/30/2013  . Palpitations 11/23/2012  . Chest pain 07/23/2012  . Essential hypertension, benign  02/20/2012  . Aortic aneurysm, including pseudoaneurysm ([place])   . Atrial fibrillation ([place]) 09/11/2011  . Long term (current) use of anticoagulants 08/30/2011  . S/P AVR 08/28/2011  . Coronary atherosclerosis of native coronary artery   . Aortic stenosis   . DYSPNEA 03/28/2009  . HYPERLIPIDEMIA-MIXED 12/19/2008    Past Surgical History:  Procedure Laterality Date  . AORTIC VALVE REPLACEMENT  08/15/2011   Procedure: AORTIC VALVE REPLACEMENT (AVR);  Surgeon: [name], MD;  Location: [facility];  Service: Open Heart Surgery;  Laterality: N/A;  . APPENDECTOMY    . CARDIAC CATHETERIZATION    . CHOLECYSTECTOMY     [facility]  . CIRCUMCISION    . COLONOSCOPY    . ESOPHAGOGASTRODUODENOSCOPY  04/07/08/13  . FEMORAL HERNIA REPAIR     x2  . FINGER CONTRACTURE RELEASE     Tendon release operation on his right little finger  . INCISIONAL HERNIA REPAIR  05/01/2011   Procedure: HERNIA REPAIR INCISIONAL;  Surgeon: [name], MD;  Location: [facility];  Service: General;  Laterality: N/A;  with Mesh  . INGUINAL EXPLORATION     Right  . MEDIASTINAL EXPLORATION  10/08/2011   Procedure: MEDIASTINAL EXPLORATION;  Surgeon: [name], MD;  Location: [facility];  Service: Open  Heart Surgery;  Laterality: N/A;  . STERNOTOMY  10/07/2011   Procedure: STERNOTOMY;  Surgeon: [name], MD;  Location: [facility];  Service: Open Heart Surgery;  Laterality: N/A;  . THORACIC AORTIC ANEURYSM REPAIR  10/07/2011   Procedure: THORACIC ASCENDING ANEURYSM REPAIR (AAA);  Surgeon: [name], MD;  Location: [facility];  Service: Open Heart Surgery;  Laterality: N/A;  . TONSILLECTOMY     at age 49  . UMBILICAL HERNIA REPAIR     [facility]       Home Medications    Prior to Admission medications   Medication Sig Start Date End Date Taking? Authorizing Provider  ALPRAZolam Duanne Moron) 1 MG tablet Take 1 mg by mouth 4 (four) times daily as needed for anxiety.  09/30/11   Historical Provider, MD  Artificial Saliva  (BIOTENE MOISTURIZING MOUTH) SOLN Use as directed 1-2 sprays in the mouth or throat 3 (three) times daily.    Historical Provider, MD  atorvastatin (LIPITOR) 80 MG tablet Take 80 mg by mouth daily.    Historical Provider, MD  baclofen (LIORESAL) 10 MG tablet Take 10 mg by mouth 3 (three) times daily.    Historical Provider, MD  ciprofloxacin (CIPRO) 500 MG tablet Take 500 mg by mouth 2 (two) times daily. [place] ON 04/15/2016    Historical Provider, MD  DULoxetine (CYMBALTA) 60 MG capsule Take 60 mg by mouth daily.    Historical Provider, MD  folic acid (FOLVITE) 1 MG tablet Take 1 mg by mouth daily.    Historical Provider, MD  furosemide (LASIX) 20 MG tablet Take 20 mg by mouth 2 (two) times daily.    Historical Provider, MD  gabapentin (NEURONTIN) 300 MG capsule Take 600 mg by mouth 3 (three) times daily.    Historical Provider, MD  levothyroxine (SYNTHROID, LEVOTHROID) 100 MCG tablet Take 100 mcg by mouth daily before breakfast.    Historical Provider, MD  lisinopril (PRINIVIL,ZESTRIL) 2.5 MG tablet Take 1 tablet (2.5 mg total) by mouth daily. 12/05/15 04/25/16  Satira Sark, MD  metoprolol tartrate (LOPRESSOR) 25 MG tablet TAKE ONE TABLET BY MOUTH THREE TIMES DAILY (MORNING, NOON, & IN THE EVENING) 03/18/16   Satira Sark, MD  metroNIDAZOLE (FLAGYL) 500 MG tablet Take 500 mg by mouth 2 (two) times daily. [place] ON 04/15/2016    Historical Provider, MD  morphine (MSIR) 15 MG tablet Take 15 mg by mouth every 6 (six) hours.    Historical Provider, MD  NITROSTAT 0.4 MG SL tablet Place 0.4 mg under the tongue every 5 (five) minutes as needed for chest pain.  07/11/12   Historical Provider, MD  pantoprazole (PROTONIX) 40 MG tablet Take 40 mg by mouth daily.      Historical Provider, MD  potassium chloride (K-DUR) 10 MEQ tablet Take 1 tablet (10 mEq total) by mouth daily. 12/17/13   Satira Sark, MD  promethazine (PHENERGAN) 25 MG tablet Take 25 mg by  mouth every 6 (six) hours as needed for nausea or vomiting.     Historical Provider, MD  traZODone (DESYREL) 50 MG tablet Take 50 mg by mouth at bedtime as needed for sleep.    Historical Provider, MD  warfarin (COUMADIN) 5 MG tablet Take 2.5-5 mg by mouth See admin instructions. Alternate taking 2.5mg  with 5mg  every evening    Historical Provider, MD    Family History Family History  Problem Relation Age of Onset  . Cancer    .  Stroke    . Anesthesia problems Neg Hx   . Hypotension Neg Hx   . Malignant hyperthermia Neg Hx   . Pseudochol deficiency Neg Hx     Social History Social History  Substance Use Topics  . Smoking status: Current Every Day Smoker    Packs/day: 0.50    Years: 48.00    Types: Cigarettes    Start date: 11/08/1962  . Smokeless tobacco: Never Used     Comment: 1/2 pack per day   . Alcohol use No     Comment: quit 59yr ago     Allergies   Bupropion and Atorvastatin   Review of Systems Review of Systems ROS: Statement: All systems negative except as marked or noted in the HPI; Constitutional: Negative for fever and chills. +generalized weakness.; ; Eyes: Negative for eye pain, redness and discharge. ; ; ENMT: Negative for ear pain, hoarseness, nasal congestion, sinus pressure and sore throat. ; ; Cardiovascular: Negative for palpitations, diaphoresis, +CP, dyspnea and peripheral edema. ; ; Respiratory: Negative for cough, wheezing and stridor. ; ; Gastrointestinal: Negative for nausea, vomiting, diarrhea, abdominal pain, blood in stool, hematemesis, jaundice and rectal bleeding. . ; ; Genitourinary: Negative for dysuria, flank pain and hematuria. ; ; Musculoskeletal: Negative for back pain and neck pain. Negative for swelling and trauma.; ; Skin: Negative for pruritus, rash, abrasions, blisters, bruising and skin lesion.; ; Neuro: Negative for headache, lightheadedness and neck stiffness. Negative for altered level of consciousness, altered mental status, extremity  weakness, paresthesias, involuntary movement, seizure and syncope.      Physical Exam Updated Vital Signs BP 134/58   Pulse 98   Temp 98.4 F (36.9 C)   Resp 18   Ht 5\' 11"  (1.803 m)   Wt 230 lb (104.3 kg)   SpO2 96%   BMI 32.08 kg/m   17:00 Orthostatic Vital Signs DF  Orthostatic Lying   BP- Lying: 146/62  Pulse- Lying: 99      Orthostatic Sitting  BP- Sitting: 113/68  Pulse- Sitting: 104      Orthostatic Standing at 0 minutes  BP- Standing at 0 minutes: 99/86  Pulse- Standing at 0 minutes: 112    Physical Exam 1655: Physical examination:  Nursing notes reviewed; Vital signs and O2 SAT reviewed;  Constitutional: Well developed, Well nourished, Well hydrated, In no acute distress; Head:  Normocephalic, atraumatic; Eyes: EOMI, PERRL, No scleral icterus; ENMT: Mouth and pharynx normal, Mucous membranes moist; Neck: Supple, Full range of motion, No lymphadenopathy; Cardiovascular: Tachycardic rate and rhythm, No gallop; Respiratory: Breath sounds coarse & equal bilaterally, No wheezes.  Speaking full sentences with ease, Normal respiratory effort/excursion; Chest: Nontender, Movement normal; Abdomen: Soft, Nontender, Nondistended, Normal bowel sounds; Genitourinary: No CVA tenderness; Extremities: Pulses normal, No tenderness, +1 RLE edema, +2-3 LLE edema with asymmetry.; Neuro: AA&Ox3, falls asleep during exam but wakes easily to his name. No facial droop. Major CN grossly intact.  Speech clear. No gross focal motor or sensory deficits in extremities.; Skin: Color normal, Warm, Dry.   ED Treatments / Results  Labs (all labs ordered are listed, but only abnormal results are displayed)   EKG  EKG Interpretation  Date/Time:  Sunday April 28 2016 16:46:34 EST Ventricular Rate:  97 PR Interval:    QRS Duration: 149 QT Interval:  385 QTC Calculation: 490 R Axis:   7 Text Interpretation:  Sinus tachycardia Ventricular trigeminy Right bundle branch block Baseline wander  When compared with ECG of 04/25/2016 No significant change  was found Confirmed by [facility]  MD, Nunzio Cory [phone]) on 04/28/2016 5:05:07 PM       Radiology   Procedures Procedures (including critical care time)  Medications Ordered in ED Medications - No data to display   Initial Impression / Assessment and Plan / ED Course  I have reviewed the triage vital signs and the nursing notes.  Pertinent labs & imaging results that were available during my care of the patient were reviewed by me and considered in my medical decision making (see chart for details).  MDM Reviewed: previous chart, nursing note and vitals Reviewed previous: labs, ECG and x-ray Interpretation: labs, ECG and x-ray   Results for orders placed or performed during the hospital encounter of [id number]  Basic metabolic panel  Result Value Ref Range   Sodium 137 135 - 145 mmol/L   Potassium 3.2 (L) 3.5 - 5.1 mmol/L   Chloride 106 101 - 111 mmol/L   CO2 22 22 - 32 mmol/L   Glucose, Bld 77 65 - 99 mg/dL   BUN 15 6 - 20 mg/dL   Creatinine, Ser 1.04 0.61 - 1.24 mg/dL   Calcium 8.0 (L) 8.9 - 10.3 mg/dL   GFR calc non Af Amer >60 >60 mL/min   GFR calc Af Amer >60 >60 mL/min   Anion gap 9 5 - 15  Brain natriuretic peptide  Result Value Ref Range   B Natriuretic Peptide 443.0 (H) 0.0 - 100.0 pg/mL  Troponin I  Result Value Ref Range   Troponin I 0.06 (HH) <0.03 ng/mL  CBC with Differential  Result Value Ref Range   WBC 7.0 4.0 - 10.5 K/uL   RBC 3.64 (L) 4.22 - 5.81 MIL/uL   Hemoglobin 10.9 (L) 13.0 - 17.0 g/dL   HCT 33.8 (L) 39.0 - 52.0 %   MCV 92.9 78.0 - 100.0 fL   MCH 29.9 26.0 - 34.0 pg   MCHC 32.2 30.0 - 36.0 g/dL   RDW 15.5 11.5 - 15.5 %   Platelets 150 150 - 400 K/uL   Neutrophils Relative % 78 %   Neutro Abs 5.5 1.7 - 7.7 K/uL   Lymphocytes Relative 13 %   Lymphs Abs 0.9 0.7 - 4.0 K/uL   Monocytes Relative 8 %   Monocytes Absolute 0.5 0.1 - 1.0 K/uL   Eosinophils Relative 1 %   Eosinophils  Absolute 0.0 0.0 - 0.7 K/uL   Basophils Relative 0 %   Basophils Absolute 0.0 0.0 - 0.1 K/uL  Protime-INR  Result Value Ref Range   Prothrombin Time 17.2 (H) 11.4 - 15.2 seconds   INR 1.39   Urine rapid drug screen (hosp performed)  Result Value Ref Range   Opiates POSITIVE (A) NONE DETECTED   Cocaine NONE DETECTED NONE DETECTED   Benzodiazepines POSITIVE (A) NONE DETECTED   Amphetamines NONE DETECTED NONE DETECTED   Tetrahydrocannabinol NONE DETECTED NONE DETECTED   Barbiturates NONE DETECTED NONE DETECTED  Magnesium  Result Value Ref Range   Magnesium 2.3 1.7 - 2.4 mg/dL   Results for AYANSH, BIRKS (MRN [id number]) as of 04/28/2016 18:05  Ref. Range 04/25/2016 13:23 04/25/2016 16:30 04/28/2016 16:54  Troponin I Latest Ref Range: <0.03 ng/mL 0.23 (HH) 0.20 (HH) 0.06 [facility])   Results for BRIANT, ARIF (MRN [id number]) as of 04/28/2016 18:05  Ref. Range 08/12/2015 13:56 04/25/2016 13:24 04/28/2016 16:58  B Natriuretic Peptide Latest Ref Range: 0.0 - 100.0 pg/mL 214.0 (H) 214.0 (H) 443.0 (H)   Dg Chest Port 1 [address]  Result Date: 04/28/2016 CLINICAL DATA:  Shortness of breath, weakness and bilateral lower extremity edema. EXAM: PORTABLE CHEST 1 VIEW COMPARISON:  04/25/2016 and prior chest radiographs FINDINGS: Cardiomegaly and aortic valve replacement changes noted. New mild interstitial opacities are suggestive of interstitial edema. Right upper lobe airspace disease may represent focal edema or pneumonia. Trace bilateral pleural effusions are noted. There is no evidence of pneumothorax or acute bony abnormality. IMPRESSION: Cardiomegaly with new mild interstitial opacities -question mild interstitial edema. Right upper lobe airspace disease-question focal edema versus pneumonia. Electronically Signed   By: Margarette Canada M.D.   On: 04/28/2016 17:35    1830:  BNP elevated with edema on CXR (doubt pneumonia without fever or elevated WBC count); low dose of IV lasix given due to orthostasis on VS. Potassium  repleted PO. Troponin trending downward from 3 days ago. Pt agrees to stay for admission today. T/C to [facility] Lorin Mercy, case discussed, including:  HPI, pertinent PM/SHx, VS/PE, dx testing, ED course and treatment:  Agreeable to admit, requests to write temporary orders, obtain tele bed to team APAdmits.   Final Clinical Impressions(s) / ED Diagnoses   Final diagnoses:  None    New Prescriptions New Prescriptions   No medications on file     Francine Graven, DO 05/01/16 1326

## 2016-04-28 NOTE — Progress Notes (Addendum)
ANTICOAGULATION CONSULT NOTE - Initial Consult  Pharmacy Consult for Coumadin (home med), Heparin until INR at goal Indication: mechanical valve  Allergies  Allergen Reactions  . Bupropion     ??  . Atorvastatin     ??   Patient Measurements: Height: 5\' 11"  (180.3 cm) Weight: 209 lb 7 oz (95 kg) IBW/kg (Calculated) : 75.3  Vital Signs: Temp: 98.8 F (37.1 C) (01/28 2024) Temp Source: Oral (01/28 2024) BP: 129/50 (01/28 2024) Pulse Rate: 97 (01/28 2024)  Labs:  Recent Labs  04/28/16 1654  HGB 10.9*  HCT 33.8*  PLT 150  LABPROT 17.2*  INR 1.39  CREATININE 1.04  TROPONINI 0.06*   Estimated Creatinine Clearance: 84.4 mL/min (by C-G formula based on SCr of 1.04 mg/dL).  Medical History: Past Medical History:  Diagnosis Date  . Anxiety   . Aortic stenosis    Status post AVR 5/13  . Arthritis   . Atrial fibrillation ([place])   . Chronic back pain    Lumbar spondylosis  . Coronary atherosclerosis of native coronary artery    Nonobstructive  . Dyslipidemia   . Fibromyalgia   . GERD (gastroesophageal reflux disease)   . Hx of colonic polyps   . Hyperlipidemia   . Long-term memory loss   . Major depressive disorder    Prior suicidal attempts  . Nocturia   . Pain management   . Peripheral vascular disease ([place])   . Seizures ([place])    6 yrs ago from withdrawal of Xanax to quickly  . Stroke ([place])   . Thoracic aortic aneurysm [facility])    Status post repair 7/13  . Tinnitus    Following prior seizures   Medications:  Prescriptions Prior to Admission  Medication Sig Dispense Refill Last Dose  . ALPRAZolam (XANAX) 1 MG tablet Take 1 mg by mouth 4 (four) times daily as needed for anxiety.    04/27/2016 at Unknown time  . atorvastatin (LIPITOR) 80 MG tablet Take 80 mg by mouth daily.   Past Week at Unknown time  . baclofen (LIORESAL) 10 MG tablet Take 10 mg by mouth 3 (three) times daily.   04/24/2016 at Unknown time  . DULoxetine (CYMBALTA) 60 MG capsule Take 60 mg by  mouth daily.   Past Week at Unknown time  . folic acid (FOLVITE) 1 MG tablet Take 1 mg by mouth daily.   Past Week at Unknown time  . furosemide (LASIX) 20 MG tablet Take 20 mg by mouth 2 (two) times daily.   Past Month at Unknown time  . gabapentin (NEURONTIN) 300 MG capsule Take 600 mg by mouth 3 (three) times daily.   Past Week at Unknown time  . levothyroxine (SYNTHROID, LEVOTHROID) 100 MCG tablet Take 100 mcg by mouth daily before breakfast.   Past Week at Unknown time  . metoprolol tartrate (LOPRESSOR) 25 MG tablet TAKE ONE TABLET BY MOUTH THREE TIMES DAILY (MORNING, NOON, & IN THE EVENING) 90 tablet 6 Past Week at Unknown time  . morphine (MSIR) 15 MG tablet Take 15 mg by mouth every 6 (six) hours.   Past Month at Unknown time  . NITROSTAT 0.4 MG SL tablet Place 0.4 mg under the tongue every 5 (five) minutes as needed for chest pain.    UNKNOWN  . pantoprazole (PROTONIX) 40 MG tablet Take 40 mg by mouth daily.     Past Week at Unknown time  . potassium chloride (K-DUR) 10 MEQ tablet Take 1 tablet (10 mEq total) by  mouth daily. 90 tablet 3 Past Week at Unknown time  . promethazine (PHENERGAN) 25 MG tablet Take 25 mg by mouth every 6 (six) hours as needed for nausea or vomiting.    Past Week at Unknown time  . traZODone (DESYREL) 50 MG tablet Take 50 mg by mouth at bedtime as needed for sleep.   Past Week at Unknown time  . warfarin (COUMADIN) 5 MG tablet Take 2.5-5 mg by mouth See admin instructions. Alternate taking 2.5mg  with 5mg  every evening   Past Week at Unknown time  . Artificial Saliva (BIOTENE MOISTURIZING MOUTH) SOLN Use as directed 1-2 sprays in the mouth or throat 3 (three) times daily.   04/24/2016 at Unknown time  . lisinopril (PRINIVIL,ZESTRIL) 2.5 MG tablet Take 1 tablet (2.5 mg total) by mouth daily. 90 tablet 3 04/24/2016 at Unknown time   Assessment: 66yo male on chronic Coumadin for h/o mechanical valve.  INR SUBtherapeutic on admission.  Cardiology added Heparin until INR at  goal.  Goal of Therapy:  INR 2.5 - 3.5 Monitor platelets by anticoagulation protocol: Yes   Plan:  Coumadin 10mg  today  x 1 to boost INR Heparin at 1400 units/hr HL in 6-8 hrs then daily INR and CBC daily  [place] Crane, Verdis Bassette A 04/28/2016,9:36 PM

## 2016-04-29 ENCOUNTER — Encounter (HOSPITAL_COMMUNITY): Payer: Self-pay | Admitting: *Deleted

## 2016-04-29 ENCOUNTER — Observation Stay (HOSPITAL_COMMUNITY): Payer: Medicare HMO

## 2016-04-29 ENCOUNTER — Observation Stay (HOSPITAL_BASED_OUTPATIENT_CLINIC_OR_DEPARTMENT_OTHER): Payer: Medicare HMO

## 2016-04-29 DIAGNOSIS — M545 Low back pain: Secondary | ICD-10-CM | POA: Diagnosis present

## 2016-04-29 DIAGNOSIS — G8929 Other chronic pain: Secondary | ICD-10-CM | POA: Diagnosis present

## 2016-04-29 DIAGNOSIS — R778 Other specified abnormalities of plasma proteins: Secondary | ICD-10-CM

## 2016-04-29 DIAGNOSIS — M5440 Lumbago with sciatica, unspecified side: Secondary | ICD-10-CM

## 2016-04-29 DIAGNOSIS — I10 Essential (primary) hypertension: Secondary | ICD-10-CM | POA: Diagnosis not present

## 2016-04-29 DIAGNOSIS — R531 Weakness: Secondary | ICD-10-CM | POA: Diagnosis present

## 2016-04-29 DIAGNOSIS — R0789 Other chest pain: Secondary | ICD-10-CM | POA: Diagnosis not present

## 2016-04-29 DIAGNOSIS — Z952 Presence of prosthetic heart valve: Secondary | ICD-10-CM

## 2016-04-29 DIAGNOSIS — R079 Chest pain, unspecified: Secondary | ICD-10-CM | POA: Diagnosis not present

## 2016-04-29 DIAGNOSIS — R413 Other amnesia: Secondary | ICD-10-CM | POA: Diagnosis present

## 2016-04-29 DIAGNOSIS — M5126 Other intervertebral disc displacement, lumbar region: Secondary | ICD-10-CM | POA: Diagnosis not present

## 2016-04-29 DIAGNOSIS — K219 Gastro-esophageal reflux disease without esophagitis: Secondary | ICD-10-CM | POA: Diagnosis not present

## 2016-04-29 DIAGNOSIS — I739 Peripheral vascular disease, unspecified: Secondary | ICD-10-CM | POA: Diagnosis not present

## 2016-04-29 DIAGNOSIS — I2510 Atherosclerotic heart disease of native coronary artery without angina pectoris: Secondary | ICD-10-CM

## 2016-04-29 DIAGNOSIS — R600 Localized edema: Secondary | ICD-10-CM | POA: Diagnosis not present

## 2016-04-29 DIAGNOSIS — R748 Abnormal levels of other serum enzymes: Secondary | ICD-10-CM | POA: Diagnosis not present

## 2016-04-29 DIAGNOSIS — R262 Difficulty in walking, not elsewhere classified: Secondary | ICD-10-CM | POA: Diagnosis present

## 2016-04-29 DIAGNOSIS — I482 Chronic atrial fibrillation: Secondary | ICD-10-CM | POA: Diagnosis not present

## 2016-04-29 DIAGNOSIS — I350 Nonrheumatic aortic (valve) stenosis: Secondary | ICD-10-CM | POA: Diagnosis not present

## 2016-04-29 DIAGNOSIS — I480 Paroxysmal atrial fibrillation: Secondary | ICD-10-CM

## 2016-04-29 DIAGNOSIS — E784 Other hyperlipidemia: Secondary | ICD-10-CM

## 2016-04-29 DIAGNOSIS — I214 Non-ST elevation (NSTEMI) myocardial infarction: Secondary | ICD-10-CM | POA: Diagnosis present

## 2016-04-29 DIAGNOSIS — M549 Dorsalgia, unspecified: Secondary | ICD-10-CM

## 2016-04-29 DIAGNOSIS — R7989 Other specified abnormal findings of blood chemistry: Secondary | ICD-10-CM

## 2016-04-29 DIAGNOSIS — I498 Other specified cardiac arrhythmias: Secondary | ICD-10-CM

## 2016-04-29 DIAGNOSIS — I499 Cardiac arrhythmia, unspecified: Secondary | ICD-10-CM

## 2016-04-29 DIAGNOSIS — M5124 Other intervertebral disc displacement, thoracic region: Secondary | ICD-10-CM | POA: Diagnosis not present

## 2016-04-29 DIAGNOSIS — E039 Hypothyroidism, unspecified: Secondary | ICD-10-CM | POA: Diagnosis present

## 2016-04-29 DIAGNOSIS — I82403 Acute embolism and thrombosis of unspecified deep veins of lower extremity, bilateral: Secondary | ICD-10-CM | POA: Diagnosis not present

## 2016-04-29 DIAGNOSIS — Z66 Do not resuscitate: Secondary | ICD-10-CM | POA: Diagnosis present

## 2016-04-29 DIAGNOSIS — I2583 Coronary atherosclerosis due to lipid rich plaque: Secondary | ICD-10-CM

## 2016-04-29 DIAGNOSIS — I4891 Unspecified atrial fibrillation: Secondary | ICD-10-CM | POA: Diagnosis not present

## 2016-04-29 DIAGNOSIS — E876 Hypokalemia: Secondary | ICD-10-CM | POA: Diagnosis present

## 2016-04-29 DIAGNOSIS — D638 Anemia in other chronic diseases classified elsewhere: Secondary | ICD-10-CM

## 2016-04-29 DIAGNOSIS — Z8673 Personal history of transient ischemic attack (TIA), and cerebral infarction without residual deficits: Secondary | ICD-10-CM | POA: Diagnosis not present

## 2016-04-29 DIAGNOSIS — D6489 Other specified anemias: Secondary | ICD-10-CM | POA: Diagnosis not present

## 2016-04-29 DIAGNOSIS — Z79899 Other long term (current) drug therapy: Secondary | ICD-10-CM | POA: Diagnosis not present

## 2016-04-29 DIAGNOSIS — Z7901 Long term (current) use of anticoagulants: Secondary | ICD-10-CM

## 2016-04-29 DIAGNOSIS — E785 Hyperlipidemia, unspecified: Secondary | ICD-10-CM | POA: Diagnosis not present

## 2016-04-29 DIAGNOSIS — F329 Major depressive disorder, single episode, unspecified: Secondary | ICD-10-CM | POA: Diagnosis present

## 2016-04-29 DIAGNOSIS — Z79891 Long term (current) use of opiate analgesic: Secondary | ICD-10-CM | POA: Diagnosis not present

## 2016-04-29 DIAGNOSIS — Z823 Family history of stroke: Secondary | ICD-10-CM | POA: Diagnosis not present

## 2016-04-29 LAB — PROTIME-INR
INR: 1.43
Prothrombin Time: 17.6 seconds — ABNORMAL HIGH (ref 11.4–15.2)

## 2016-04-29 LAB — BASIC METABOLIC PANEL
ANION GAP: 8 (ref 5–15)
BUN: 15 mg/dL (ref 6–20)
CO2: 21 mmol/L — AB (ref 22–32)
Calcium: 7.5 mg/dL — ABNORMAL LOW (ref 8.9–10.3)
Chloride: 107 mmol/L (ref 101–111)
Creatinine, Ser: 1.05 mg/dL (ref 0.61–1.24)
GFR calc Af Amer: >60 mL/min (ref >60)
GFR calc non Af Amer: >60 mL/min (ref >60)
GLUCOSE: 78 mg/dL (ref 65–99)
POTASSIUM: 3.4 mmol/L — AB (ref 3.5–5.1)
Sodium: 136 mmol/L (ref 135–145)

## 2016-04-29 LAB — ECHOCARDIOGRAM COMPLETE
Height: 71 in
Weight: 3376 oz

## 2016-04-29 LAB — TROPONIN I
TROPONIN I: 0.05 ng/mL — AB (ref <0.03)
Troponin I: 0.05 ng/mL (ref <0.03)

## 2016-04-29 LAB — LIPID PANEL
CHOL/HDL RATIO: 6.6 ratio
Cholesterol: 92 mg/dL (ref 0–200)
HDL: 14 mg/dL — AB (ref >40)
LDL Cholesterol: 62 mg/dL (ref 0–99)
TRIGLYCERIDES: 80 mg/dL (ref <150)
VLDL: 16 mg/dL (ref 0–40)

## 2016-04-29 LAB — CBC
HCT: 30.6 % — ABNORMAL LOW (ref 39.0–52.0)
Hemoglobin: 10 g/dL — ABNORMAL LOW (ref 13.0–17.0)
MCH: 30.1 pg (ref 26.0–34.0)
MCHC: 32.7 g/dL (ref 30.0–36.0)
MCV: 92.2 fL (ref 78.0–100.0)
PLATELETS: 153 10*3/uL (ref 150–400)
RBC: 3.32 MIL/uL — ABNORMAL LOW (ref 4.22–5.81)
RDW: 15.5 % (ref 11.5–15.5)
WBC: 6.3 10*3/uL (ref 4.0–10.5)

## 2016-04-29 MED ORDER — HEPARIN (PORCINE) IN NACL 100-0.45 UNIT/ML-% IJ SOLN
1400.0000 [IU]/h | INTRAMUSCULAR | Status: DC
  Administered 2016-04-29: 1400 [IU]/h via INTRAVENOUS
  Filled 2016-04-29: qty 250

## 2016-04-29 MED ORDER — POTASSIUM CHLORIDE CRYS ER 10 MEQ PO TBCR: 10.0000 meq | EXTENDED_RELEASE_TABLET | Freq: Every day | ORAL | Status: DC

## 2016-04-29 MED ORDER — POTASSIUM CHLORIDE CRYS ER 20 MEQ PO TBCR
40.0000 meq | EXTENDED_RELEASE_TABLET | Freq: Once | ORAL | Status: AC
  Administered 2016-04-29: 40 meq via ORAL
  Filled 2016-04-29: qty 2

## 2016-04-29 MED ORDER — ALPRAZOLAM 0.5 MG PO TABS
0.5000 mg | ORAL_TABLET | Freq: Two times a day (BID) | ORAL | Status: DC
  Administered 2016-04-29 – 2016-05-02 (×7): 0.5 mg via ORAL
  Filled 2016-04-29 (×7): qty 1

## 2016-04-29 MED ORDER — HYDROCODONE-ACETAMINOPHEN 5-325 MG PO TABS
1.0000 | ORAL_TABLET | Freq: Four times a day (QID) | ORAL | Status: DC | PRN
  Administered 2016-04-29 – 2016-05-02 (×7): 1 via ORAL
  Filled 2016-04-29 (×8): qty 1

## 2016-04-29 MED ORDER — WARFARIN SODIUM 5 MG PO TABS
10.0000 mg | ORAL_TABLET | Freq: Once | ORAL | Status: DC
Start: 2016-04-29 — End: 2016-05-01
  Filled 2016-04-29: qty 2

## 2016-04-29 MED ORDER — LEVOTHYROXINE SODIUM 100 MCG IV SOLR
50.0000 ug | Freq: Once | INTRAVENOUS | Status: AC
  Administered 2016-04-29: 50 ug via INTRAVENOUS
  Filled 2016-04-29: qty 5

## 2016-04-29 MED ORDER — WARFARIN SODIUM 5 MG PO TABS: 10.0000 mg | ORAL_TABLET | ORAL | Status: DC

## 2016-04-29 MED ORDER — BACLOFEN 10 MG PO TABS
5.0000 mg | ORAL_TABLET | Freq: Three times a day (TID) | ORAL | Status: DC
  Administered 2016-04-29 – 2016-05-02 (×9): 5 mg via ORAL
  Filled 2016-04-29 (×11): qty 1

## 2016-04-29 MED ORDER — POTASSIUM CHLORIDE CRYS ER 20 MEQ PO TBCR
40.0000 meq | EXTENDED_RELEASE_TABLET | Freq: Once | ORAL | Status: DC
  Filled 2016-04-29: qty 2

## 2016-04-29 MED ORDER — FUROSEMIDE 10 MG/ML IJ SOLN
40.0000 mg | Freq: Two times a day (BID) | INTRAMUSCULAR | Status: DC
  Administered 2016-04-29: 40 mg via INTRAVENOUS
  Filled 2016-04-29: qty 4

## 2016-04-29 NOTE — Progress Notes (Signed)
Pt has refused all medications at this time including coumadin. Pt has been educated on the importance of coumadin at this time and the risks involved in refusing coumadin. Pt still refuses medication.

## 2016-04-29 NOTE — Progress Notes (Signed)
PT Cancellation Note  Patient Details Name: SHANNON SODERMAN MRN: [id number] DOB: Jan 01, 1951   Cancelled Treatment:    Reason Eval/Treat Not Completed: Patient at procedure or test/unavailable (Pt is currently off the floor for B LE [place], as well as CT of the spine.  Will check back later today. )   Beth Yamina Lenis, PT, DPT X: [phone]

## 2016-04-29 NOTE — Consult Note (Signed)
Cardiology Consultation Note    Patient ID: [name], MRN: [id number], DOB/AGE: 1951/03/09 66 y.o. Admit date: 04/28/2016   Date of Consult: 04/29/2016 Primary Physician: Gar Ponto, MD Primary Cardiologist: Domenic Polite  Chief Complaint: Diffuse Pain (Particularly Back Pain) Reason for Consultation: Chest Pain/Elevated Troponin Requesting MD: Dr. Candiss Norse  HPI: [name] is a 66 y.o. male with history of complex PMH including ?recent undetermined lesions (see below), previous atrial fib, AS s/p mechanical St. 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 He was seen in the ER on 1/25 for "total body pain, severe fatigue, and bilateral lower extremity edema." His troponin was 0.23 with no acute changes on EKG. The patient refused admission, threw his urine around the room; removed his telemetry, and eventually signed out AMA. Flu panel and lactate were wnl that visit. He returned to the ED overnight citing that he has had nothing to eat or drink for 6 days and has been sitting in a recliner all the time. He was able to drive a week ago but reported rapid decline with diffuse body pain (abs, hips, feet) as well as chest pain. This has been on and off x 1 week. The chest pain is part of the diffuse myalgias, worse with stress, intermittent. No exertional component although he states he has not done much in the last few days. He reports subjective fever, chills, as well as nausea, vomiting. No significant SOB. He also reports chronic LEE for months. Labs this admission notable for hypokalemia down to 3.2, Mg 2.3, updated troponin of 0.06-0.06-0.05. Hgb 10.9->10, TSH wnl, UDS + benzo/opiates (home rx). Temp this AM is 100.2,  pulse ox borderine at 92% on 2L. INR only 1.43. He reports this is followed by his PCP who is not in Epic. CXR with possible mild interstitial edema, RUL airspace disease (focal edema vs PNA). He currently denies any chest pain but states it feels like there is a knife sticking him in the back whenever he moves in bed. No orthopnea, PND.  Of note there is a PET scan in the system dated 03/2016 for liver lesion and enlarged lymph nodes. The patient reports he was told by his PCP that he had 2 spots on his lung and 2 spots on his brain, but he states it completely slipped his mind to schedule this study.     Prior Studies: 2D Echo 01/2015: Study Conclusions - Left ventricle: The cavity size was normal. Wall thickness was   increased increased in a pattern of mild to moderate LVH.   Systolic function was normal. The estimated ejection fraction was   in the range of 60% to 65%. Doppler parameters are consistent   with abnormal left ventricular relaxation (grade 1 diastolic   dysfunction). - Aortic valve: There was no stenosis. There was trivial   regurgitation. Mean gradient (S): 4 mm Hg. Valve area (VTI): 3.41   cm^2. Valve area (Vmax): 3.33 cm^2. Valve area (Vmean): 3.5 cm^2. - Atrial septum: No defect or patent foramen ovale was identified. - Technically difficult study.  Coronary angiography 07/2011 Coronary dominance: Right   Left Main:  Normal in size and short. It is free of significant disease.  Left Anterior Descending (LAD):  Normal in size with no significant disease.  1st diagonal (D1):  Medium in size and free of significant disease.  2nd diagonal (D2):  Normal in size with minor irregularities.  3rd diagonal (D3):  Very small in size.  Circumflex (LCx):   Normal in size and nondominant. It is tortuous proximally. There is a 40% lesion in the midsegment.  1st obtuse marginal:  Small in size and free of significant disease.  2nd obtuse marginal:  Normal in size and  free of significant disease.  3rd obtuse marginal:  Normal in size and free of significant disease.       Right Coronary Artery: Normal in size and dominant. There is a 30-40% stenosis proximally. There is a 30% tubular disease in the midsegment at an RV branch. The rest of the vessel has no significant disease.  right ventricle branch of right coronary artery: Normal in size and free of significant disease.  posterior descending artery: Normal in size with minor irregularities.  posterior lateral branch:  No significant disease.  Past Medical History:  Diagnosis Date  . Anemia   . Anxiety   . Aortic stenosis    a. Status post St. Jude mechanical AVR 5/13.  . Arthritis   . Chronic back pain    Lumbar spondylosis  . Coronary atherosclerosis of native coronary artery    a. Mild, nonobstructive dz by cath 07/2011.  . Fibromyalgia   . GERD (gastroesophageal reflux disease)   . Hx of colonic polyps   . Hyperlipidemia   . Hypotension   . Long-term memory loss   . Major depressive disorder    Prior suicidal attempts  . Nocturia   . Orthostasis   . PAF (paroxysmal atrial fibrillation) ([place])   . Pain management   . Peripheral vascular disease ([employer])   . Seizures ([place])    6 yrs ago from withdrawal of Xanax to quickly  . Stroke ([place])   . Thoracic aortic aneurysm ([place])    a. 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  [name] Tinnitus    Following prior seizures      Surgical History:  Past Surgical History:  Procedure Laterality Date  . AORTIC VALVE REPLACEMENT  08/15/2011   Procedure: AORTIC VALVE REPLACEMENT (AVR);  Surgeon: [name], MD;  Location: [place];  Service: Open Heart Surgery;  Laterality: N/A;  . APPENDECTOMY    . CARDIAC CATHETERIZATION    . CHOLECYSTECTOMY     [facility]  . CIRCUMCISION    . COLONOSCOPY    . ESOPHAGOGASTRODUODENOSCOPY  04/07/08/13  . FEMORAL HERNIA REPAIR     x2  . FINGER CONTRACTURE RELEASE     Tendon release operation on his  right little finger  . INCISIONAL HERNIA REPAIR  05/01/2011   Procedure: HERNIA REPAIR INCISIONAL;  Surgeon: [name], MD;  Location: [facility];  Service: General;  Laterality: N/A;  with Mesh  . INGUINAL EXPLORATION     Right  . MEDIASTINAL EXPLORATION  10/08/2011   Procedure: MEDIASTINAL EXPLORATION;  Surgeon: [name], MD;  Location: [place];  Service: Open Heart Surgery;  Laterality: N/A;  . STERNOTOMY  10/07/2011   Procedure: STERNOTOMY;  Surgeon: [name], MD;  Location: [facility];  Service: Open Heart Surgery;  Laterality: N/A;  . THORACIC AORTIC ANEURYSM REPAIR  10/07/2011   Procedure: THORACIC ASCENDING ANEURYSM REPAIR (AAA);  Surgeon: [name], MD;  Location: [place];  Service: Open Heart Surgery;  Laterality: N/A;  . TONSILLECTOMY     at age 13  . UMBILICAL HERNIA  REPAIR     [facility]     Home Meds: Prior to Admission medications   Medication Sig Start Date End Date Taking? Authorizing Provider  ALPRAZolam Duanne Moron) 1 MG tablet Take 1 mg by mouth 4 (four) times daily as needed for anxiety.  09/30/11  Yes Historical Provider, MD  atorvastatin (LIPITOR) 80 MG tablet Take 80 mg by mouth daily.   Yes Historical Provider, MD  baclofen (LIORESAL) 10 MG tablet Take 10 mg by mouth 3 (three) times daily.   Yes Historical Provider, MD  DULoxetine (CYMBALTA) 60 MG capsule Take 60 mg by mouth daily.   Yes Historical Provider, MD  folic acid (FOLVITE) 1 MG tablet Take 1 mg by mouth daily.   Yes Historical Provider, MD  furosemide (LASIX) 20 MG tablet Take 20 mg by mouth 2 (two) times daily.   Yes Historical Provider, MD  gabapentin (NEURONTIN) 300 MG capsule Take 600 mg by mouth 3 (three) times daily.   Yes Historical Provider, MD  levothyroxine (SYNTHROID, LEVOTHROID) 100 MCG tablet Take 100 mcg by mouth daily before breakfast.   Yes Historical Provider, MD  metoprolol tartrate (LOPRESSOR) 25 MG tablet TAKE ONE TABLET BY MOUTH THREE TIMES DAILY (MORNING, NOON, & IN THE  EVENING) 03/18/16  Yes Satira Sark, MD  morphine (MSIR) 15 MG tablet Take 15 mg by mouth every 6 (six) hours.   Yes Historical Provider, MD  NITROSTAT 0.4 MG SL tablet Place 0.4 mg under the tongue every 5 (five) minutes as needed for chest pain.  07/11/12  Yes Historical Provider, MD  pantoprazole (PROTONIX) 40 MG tablet Take 40 mg by mouth daily.     Yes Historical Provider, MD  potassium chloride (K-DUR) 10 MEQ tablet Take 1 tablet (10 mEq total) by mouth daily. 12/17/13  Yes Satira Sark, MD  promethazine (PHENERGAN) 25 MG tablet Take 25 mg by mouth every 6 (six) hours as needed for nausea or vomiting.    Yes Historical Provider, MD  traZODone (DESYREL) 50 MG tablet Take 50 mg by mouth at bedtime as needed for sleep.   Yes Historical Provider, MD  warfarin (COUMADIN) 5 MG tablet Take 2.5-5 mg by mouth See admin instructions. Alternate taking 2.5mg  with 5mg  every evening   Yes Historical Provider, MD  Artificial Saliva (BIOTENE MOISTURIZING MOUTH) SOLN Use as directed 1-2 sprays in the mouth or throat 3 (three) times daily.    Historical Provider, MD  lisinopril (PRINIVIL,ZESTRIL) 2.5 MG tablet Take 1 tablet (2.5 mg total) by mouth daily. 12/05/15 04/25/16  Satira Sark, MD    Inpatient Medications:  . ALPRAZolam  0.5 mg Oral BID  . antiseptic oral rinse  15 mL Mouth Rinse TID  . aspirin EC  81 mg Oral Daily  . atorvastatin  80 mg Oral Daily  . baclofen  5 mg Oral TID  . DULoxetine  60 mg Oral Daily  . folic acid  1 mg Oral Daily  . furosemide  40 mg Intravenous BID  . levothyroxine  50 mcg Intravenous Once  . levothyroxine  100 mcg Oral QAC breakfast  . lisinopril  2.5 mg Oral Daily  . metoprolol tartrate  25 mg Oral TID  . pantoprazole  40 mg Oral Daily  . potassium chloride  40 mEq Oral Once  . potassium chloride  40 mEq Oral Once  . Warfarin - Pharmacist Dosing Inpatient   Does not apply Q24H     Allergies:  Allergies  Allergen Reactions  . Bupropion     ??  .  Atorvastatin     ??    Social History   Social History  . Marital status: Single    Spouse name: N/A  . Number of children: N/A  . Years of education: N/A   Occupational History  . Disabled    Social History Main Topics  . Smoking status: Former Smoker    Packs/day: 0.50    Years: 48.00    Types: Cigarettes    Start date: 11/08/1962    Quit date: 04/21/2016  . Smokeless tobacco: Never Used     Comment: 1/2 pack per day   . Alcohol use No     Comment: quit 43yr ago  . Drug use: No  . Sexual activity: Yes    Birth control/ protection: None   Other Topics Concern  . Not on file   Social History Narrative  . No narrative on file     Family History  Problem Relation Age of Onset  . Cancer    . Stroke    . Anesthesia problems Neg Hx   . Hypotension Neg Hx   . Malignant hyperthermia Neg Hx   . Pseudochol deficiency Neg Hx      Review of Systems:as above. No syncope. No palpitations. All other systems reviewed and are otherwise negative except as noted above.  Labs:  Recent Labs  04/28/16 1654 04/28/16 2122 04/29/16 0344  TROPONINI 0.06* 0.06* 0.05*   Lab Results  Component Value Date   WBC 6.3 04/29/2016   HGB 10.0 (L) 04/29/2016   HCT 30.6 (L) 04/29/2016   MCV 92.2 04/29/2016   PLT 153 04/29/2016    Recent Labs Lab 04/25/16 1310  04/29/16 0344  NA 138  < > 136  K 3.3*  < > 3.4*  CL 103  < > 107  CO2 25  < > 21*  BUN 10  < > 15  CREATININE 1.24  < > 1.05  CALCIUM 8.6*  < > 7.5*  PROT 8.1  --   --   BILITOT 1.2  --   --   ALKPHOS 82  --   --   ALT 11*  --   --   AST 26  --   --   GLUCOSE 84  < > 78  < > = values in this interval not displayed. Lab Results  Component Value Date   CHOL 92 04/29/2016   HDL 14 (L) 04/29/2016   LDLCALC 62 04/29/2016   TRIG 80 04/29/2016   Lab Results  Component Value Date   DDIMER 3.14 (H) 10/07/2011    Radiology/Studies:  Dg Chest 2 View  Result Date: 04/25/2016 CLINICAL DATA:  Productive cough,  chest pain. EXAM: CHEST  2 VIEW COMPARISON:  Radiograph of February 24, 2016. FINDINGS: Stable cardiomegaly. Status post aortic valve repair. No pneumothorax or pleural effusion is noted. Stable mild central pulmonary vascular congestion is noted. Bony thorax is unremarkable. IMPRESSION: Stable cardiomegaly and mild central pulmonary vascular congestion. Electronically Signed   By: Marijo Conception, M.D.   On: 04/25/2016 13:22   Dg Chest Port 1 View  Result Date: 04/28/2016 CLINICAL DATA:  Shortness of breath, weakness and bilateral lower extremity edema. EXAM: PORTABLE CHEST 1 VIEW COMPARISON:  04/25/2016 and prior chest radiographs FINDINGS: Cardiomegaly and aortic valve replacement changes noted. New mild interstitial opacities are suggestive of interstitial edema. Right upper lobe airspace disease may represent focal edema or pneumonia. Trace bilateral pleural effusions are noted. There is no evidence of pneumothorax or  acute bony abnormality. IMPRESSION: Cardiomegaly with new mild interstitial opacities -question mild interstitial edema. Right upper lobe airspace disease-question focal edema versus pneumonia. Electronically Signed   By: Margarette Canada M.D.   On: 04/28/2016 17:35    Wt Readings from Last 3 Encounters:  04/28/16 209 lb 7 oz (95 kg)  04/25/16 164 lb (74.4 kg)  12/05/15 217 lb (98.4 kg)    EKG: Today: NSR 98bpm with frequnet PVCs, left axis deviation, RBBB. Yesterday and prior are similar.  Physical Exam: Blood pressure (!) 131/53, pulse 93, temperature 100.2 F (37.9 C), temperature source Oral, resp. rate 20, height 5\' 11"  (1.803 m), weight 209 lb 7 oz (95 kg), SpO2 92 %. Body mass index is 29.21 kg/m. General: Well developed, well nourished WM, in no acute distress. Head: Normocephalic, atraumatic, sclera non-icteric, no xanthomas, nares are without discharge.  Neck: Negative for carotid bruits. JVD not elevated. Lungs: Clear bilaterally to auscultation without wheezes, rales,  or rhonchi. Breathing is unlabored. Heart: Irregular due to frequent ectopy, crisp valve click, no murmurs, rubs, or gallops appreciated. Abdomen: Soft, non-tender, non-distended with normoactive bowel sounds. No hepatomegaly. No rebound/guarding. No obvious abdominal masses. Msk:  Strength and tone appear normal for age. Extremities: No clubbing or cyanosis. Trace-1+ chronic appearing nonpitting edema.  Distal pedal pulses are 2+ and equal bilaterally. Neuro: Alert and oriented X 3. No facial asymmetry. No focal deficit. Moves all extremities spontaneously. Psych:  Responds to questions appropriately with a flattened affect. No SI/HI    Assessment and Plan  74M with complex PMH including ?recent undetermined lesions (?2 spots on brain/lung per patient; liver lesion per PET order), previous paroxysmal atrial fib (CHADSVASC 3), AS s/p mechanical St. 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  1. Weakness/myalgias/back pain: clinical picture is atypical for ACS, suspect more systemic process going on. His temp is mildly elevated and HR is higher than usual, possibly indicative of SIRS process. His back pain is concerning in the face of recent undetermined lesions as above. Would recommend further workup of this to include blood cultures and possible further oncologic evaluation - reviewed with IM.   2. Chest pain with mildly elevated troponin - as above, somewhat atypical. Likely needs clarification of the above before proceeding with further workup. Will review with cardiologist. In the interim continue ASA, BB,  statin. If LE duplex is positive for DVT will need CTA to exclude PE.  3. Lower extremity edema - patient reports this has been chronic for many months. BNP 443. He received 20mg  IV Lasix yesterday and another 40mg  this AM. He exhibits striations on his legs which make it appear as though edema was much worse at one point. He is laying flat without any breathing difficulty. Weight is actually down 8lb from prior OV. Will d/c further Lasix and follow clinical course. Check LE venous duplex given concern for malignancy as above and subtherapeutic INR.  4. Mechanical AVR - subtherapeutic INR on admission. Will discuss IV heparin with MD in light of the above ongoing issues.   5. Ventricular bigeminy - known from prior EKGs. IM is repleting lytes. Will give additional 29meq potassium this afternoon since he received IV Lasix this AM with low  K. Likely needs standing potassium, will await AM BMET to decide on dose. If appropriate may need to obtain OP holter for quantification of burden. Baseline HR in the 50s when feeling well, thus cannot titrate BB too aggressively.  6. Chronic appearing anemia - per IM.  Signed, Charlie Pitter PA-C 04/29/2016, 8:37 AM Pager: [phone]  The patient was seen and examined, and I agree with the history, physical exam, assessment and plan as documented above. Patient with complex past medical history and presentation as detailed above. I do not feel his chest pain is cardiac in etiology. Troponins are flat and he had mild nonobstructive circumflex and RCA disease in 2013. I do not recommend an ischemic evaluation. I agree with IV heparin and warfarin until INR is therapeutic given his mechanical aortic valve. His other medical problems are more of a pressing concern at this time.  Kate Sable, MD, [facility]  04/29/2016 9:55 AM

## 2016-04-29 NOTE — Progress Notes (Signed)
Nutrition Assessment    INTERVENTION:  Nursing Staff: Please provide patient with snack in-between meals. Provide unit nourishment, such as applesauce or graham crackers and peanut butter.    NUTRITION DIAGNOSIS:   Increased nutrient needs related to chronic illness as evidenced by estimated needs.   GOAL:   Patient will meet greater than or equal to 90% of their needs   MONITOR:   Po intake (meals and snacks), labs and wt trends     REASON FOR ASSESSMENT:   Malnutrition Screening Tool    ASSESSMENT:  Patient is a 66 yo male with hx includes CVA,, CAD, PVD, depression and chronic pain. Diminished appetite prior to admission but patient is unable to quantify. He says " I don't feel like talking".    Usual body weight 95-98 kg- stable other than erroneous weight of 74.4 kg on 04/25/16.   Nutrition-Focused physical exam: WDL    Recent Labs Lab 04/25/16 1310 04/28/16 1654 04/29/16 0344  NA 138 137 136  K 3.3* 3.2* 3.4*  CL 103 106 107  CO2 25 22 21*  BUN 10 15 15   CREATININE 1.24 1.04 1.05  CALCIUM 8.6* 8.0* 7.5*  MG  --  2.3  --   GLUCOSE 84 77 78   Labs: potassium 3.4   Meds: folic acid, lipitor, potassium and protonix  Diet Order:  Diet Heart Room service appropriate? Yes; Fluid consistency: Thin  Skin:  Reviewed, no issues  Last BM:  04/25/16  Height:   Ht Readings from Last 1 Encounters:  04/28/16 5\' 11"  (1.803 m)    Weight:   Wt Readings from Last 1 Encounters:  04/29/16 211 lb (95.7 kg)    Ideal Body Weight:  78 kg  BMI:  Body mass index is 29.43 kg/m.  Estimated Nutritional Needs:   Kcal:  1800-2000  Protein:  99-115 gr  Fluid:  1.8-1.9 liters daily  EDUCATION NEEDS:   No education needs identified at this time  Colman Cater MS,RD,CSG,LDN Office: [phone] Pager: [phone]

## 2016-04-29 NOTE — Progress Notes (Signed)
Informed by Radiology technician that pt has refused CT scan. MD has been notified.

## 2016-04-29 NOTE — Care Management Obs Status (Signed)
[place] NOTIFICATION   Patient Details  Name: [name] MRN: [id number] Date of Birth: 1951/03/10   Medicare Observation Status Notification Given:  Yes    Mclane Arora, Chauncey Reading, RN 04/29/2016, 1:19 PM

## 2016-04-29 NOTE — Care Management Note (Signed)
Case Management Note  Patient Details  Name: [name] MRN: [id number] Date of Birth: 12/23/1950  Subjective/Objective:         Patient adm from home alone, has cane/walker PTA. He states that he has been progressively getting weaker and having difficulty walking. PT eval pending. He states that he has a PCP, had been driving up until recently to appointments, reports no issues affording medications.            Action/Plan: CM following for needs.   Expected Discharge Date:  04/30/16               Expected Discharge Plan:  Home/Self Care  In-House Referral:  NA  Discharge planning Services  CM Consult  Post Acute Care Choice:  NA Choice offered to:  NA  DME Arranged:    DME Agency:     HH Arranged:    HH Agency:     Status of Service:  In process, will continue to follow  If discussed at Long Length of Stay Meetings, dates discussed:    Additional Comments:  Dez Stauffer, Chauncey Reading, RN 04/29/2016, 12:09 PM

## 2016-04-29 NOTE — Progress Notes (Signed)
Pt has refused Heparin level blood draw from lab tech. Pt has also refused Heparin IV. Pt is combative and is refusing care at this time. MD has been notified and received verbal order to discontinue Heparin at this time. Pharmacy informed of patient's refusal of Heparin.

## 2016-04-29 NOTE — Progress Notes (Signed)
PROGRESS NOTE                                                                                                                                                                                                             Patient Demographics:    [name], is a 66 y.o. male, DOB - 1950-06-25, [id number]  Admit date - 04/28/2016   Admitting Physician Karmen Bongo, MD  Outpatient Primary MD for the patient is Gar Ponto, MD  LOS - 0  Chief Complaint  Patient presents with  . Shortness of Breath       Brief Narrative   [name] is a 66 y.o. male with medical history significant of CVA, PVD, chronic pain, MDD, HLD, CAD, afib, and s/p AVR on anticoagulation who was admitted to the hospital with generalized abdominal pain more so in his lower back and both lower extremities. Patient saying that he is unable to walk. Note pain has been ongoing for several years.   Subjective:    [name] today has, No headache, No chest pain, No abdominal pain - No Nausea, No new weakness tingling or numbness, No Cough - SOB. Has generalized pain all over, chronic low back pain ongoing for several years.   Assessment  & Plan :    1.Low back pain. Bilateral lower extremity weakness. Per patient the symptoms have been present for a few years but worse over the last few weeks, per patient he was also being worked up for some spots in his liver and brain.  At this time will do CT scan T and L-spine to rule out any pathological fracture or metastasis, initiate PT and pain control.  2. Nonspecific chest pain, currently resolved. Upon in trend unremarkable and in non-ACS pattern. Echo shows preserved EF of 60% without any wall motion abnormality, cardiology on board, continue aspirin, statin and beta blocker for secondary prevention along with Coumadin.  3. GERD. On PPI.  4. Paroxysmal atrial fibrillation with mechanical aortic valve.  Cardiology following, echo stable, continue beta blocker, Coumadin and heparin bridging.  5. Bilateral lower extremity DVT. Coumadin with heparin bridging for now.  6. Hypothyroidism. On Synthroid continue.  7. Hypertension. Continue combination of beta blocker and ACE inhibitor  8. Dyslipidemia. On statin.  9. Ventricular bigeminy. Monitor electrolytes. Asymptomatic, further workup per cardiology. He is  symptom-free. EF more than 60%.   Family Communication  :  None  Code Status :  Full  Diet : Diet Heart Room service appropriate? Yes; Fluid consistency: Thin   Disposition Plan  :  TBD  Consults  :  Cards  Procedures  :    TTE Mild LVH with LVEF 60-65%. Grade 2 diastolic dysfunction. Mildly   thickened mitral leaflets with trivial mitral regurgitation.   Mechanical prosthesis in aortic position as described above with   grossly normal function, trivial aortic regurgitation, and a   small perivalvular leak. Mild to moderate right ventricular   dysfunction. Trivial tricuspid regurgitation.  CT T&L spine -   Lower extremity venous duplex - bilateral lower extremity DVT  DVT Prophylaxis  :  Coumadin  Lab Results  Component Value Date   INR 1.43 04/29/2016   INR 1.39 04/28/2016   INR 1.50 04/25/2016     Lab Results  Component Value Date   PLT 153 04/29/2016    Inpatient Medications  Scheduled Meds: . ALPRAZolam  0.5 mg Oral BID  . antiseptic oral rinse  15 mL Mouth Rinse TID  . aspirin EC  81 mg Oral Daily  . atorvastatin  80 mg Oral Daily  . baclofen  5 mg Oral TID  . DULoxetine  60 mg Oral Daily  . folic acid  1 mg Oral Daily  . levothyroxine  100 mcg Oral QAC breakfast  . lisinopril  2.5 mg Oral Daily  . metoprolol tartrate  25 mg Oral TID  . pantoprazole  40 mg Oral Daily  . potassium chloride  40 mEq Oral Once  . warfarin  10 mg Oral Once  . Warfarin - Pharmacist Dosing Inpatient   Does not apply Q24H   Continuous Infusions: . heparin 1,400  Units/hr (04/29/16 1218)   PRN Meds:.acetaminophen, HYDROcodone-acetaminophen, nitroGLYCERIN, ondansetron (ZOFRAN) IV  Antibiotics  :    Anti-infectives    None         Objective:   Vitals:   04/29/16 0522 04/29/16 0818 04/29/16 1000 04/29/16 1159  BP: (!) 131/53  (!) 124/58 (!) 124/58  Pulse: 93  90   Resp: 20     Temp: 100.2 F (37.9 C)     TempSrc: Oral     SpO2: 92%     Weight:  95.7 kg (211 lb)    Height:        Wt Readings from Last 3 Encounters:  04/29/16 95.7 kg (211 lb)  04/25/16 74.4 kg (164 lb)  12/05/15 98.4 kg (217 lb)     Intake/Output Summary (Last 24 hours) at 04/29/16 1333 Last data filed at 04/29/16 0317  Gross per 24 hour  Intake           713.75 ml  Output                0 ml  Net           713.75 ml     Physical Exam  Awake Alert, Oriented X 3, No new F.N deficits, Normal affect [place].AT,PERRAL Supple Neck,No JVD, No cervical lymphadenopathy appriciated.  Symmetrical Chest wall movement, Good air movement bilaterally, CTAB RRR,No Gallops,Rubs, Positive metallic aortic valve murmur, No Parasternal Heave +ve B.Sounds, Abd Soft, No tenderness, No organomegaly appriciated, No rebound - guarding or rigidity. No Cyanosis, Clubbing or edema, No new Rash or bruise       Data Review:    CBC  Recent Labs Lab 04/25/16 1310 04/28/16 1654  04/29/16 0344  WBC 8.3 7.0 6.3  HGB 11.9* 10.9* 10.0*  HCT 36.7* 33.8* 30.6*  PLT 106* 150 153  MCV 94.3 92.9 92.2  MCH 30.6 29.9 30.1  MCHC 32.4 32.2 32.7  RDW 15.6* 15.5 15.5  LYMPHSABS 0.9 0.9  --   MONOABS 0.7 0.5  --   EOSABS 0.0 0.0  --   BASOSABS 0.0 0.0  --     Chemistries   Recent Labs Lab 04/25/16 1310 04/28/16 1654 04/29/16 0344  NA 138 137 136  K 3.3* 3.2* 3.4*  CL 103 106 107  CO2 25 22 21*  GLUCOSE 84 77 78  BUN 10 15 15   CREATININE 1.24 1.04 1.05  CALCIUM 8.6* 8.0* 7.5*  MG  --  2.3  --   AST 26  --   --   ALT 11*  --   --   ALKPHOS 82  --   --   BILITOT 1.2  --   --     ------------------------------------------------------------------------------------------------------------------  Recent Labs  04/29/16 0344  CHOL 92  HDL 14*  LDLCALC 62  TRIG 80  CHOLHDL 6.6    Lab Results  Component Value Date   HGBA1C 6.1 (H) 08/13/2011   ------------------------------------------------------------------------------------------------------------------  Recent Labs  04/28/16 2122  TSH 1.287   ------------------------------------------------------------------------------------------------------------------ No results for input(s): VITAMINB12, FOLATE, FERRITIN, TIBC, IRON, RETICCTPCT in the last 72 hours.  Coagulation profile  Recent Labs Lab 04/25/16 1324 04/28/16 1654 04/29/16 0344  INR 1.50 1.39 1.43    No results for input(s): DDIMER in the last 72 hours.  Cardiac Enzymes  Recent Labs Lab 04/28/16 1654 04/28/16 2122 04/29/16 0344  TROPONINI 0.06* 0.06* 0.05*   ------------------------------------------------------------------------------------------------------------------    Component Value Date/Time   BNP 443.0 (H) 04/28/2016 1658    Micro Results No results found for this or any previous visit (from the past 240 hour(s)).  Radiology Reports Dg Chest 2 View  Result Date: 04/25/2016 CLINICAL DATA:  Productive cough, chest pain. EXAM: CHEST  2 VIEW COMPARISON:  Radiograph of February 24, 2016. FINDINGS: Stable cardiomegaly. Status post aortic valve repair. No pneumothorax or pleural effusion is noted. Stable mild central pulmonary vascular congestion is noted. Bony thorax is unremarkable. IMPRESSION: Stable cardiomegaly and mild central pulmonary vascular congestion. Electronically Signed   By: Marijo Conception, M.D.   On: 04/25/2016 13:22   US Venous Img Lower Bilateral  Result Date: 04/29/2016 CLINICAL DATA:  Chronic bilateral pain, edema. Previous tobacco abuse. On Coumadin. EXAM: BILATERAL LOWER EXTREMITY VENOUS DOPPLER  ULTRASOUND TECHNIQUE: Eutsler-scale sonography with compression, as well as color and duplex ultrasound, were performed to evaluate the deep venous system from the level of the common femoral vein through the popliteal and proximal calf veins. COMPARISON:  None FINDINGS: There is hypoechoic thrombus in the mid right femoral vein resulting in incompressibility. Some continued flow across this level on color Doppler. Normal compressibility of the common femoral and popliteal veins, as well as the proximal calf veins. Visualized segments of the saphenous venous system normal in caliber and compressibility. On the left, occlusive thrombus in the popliteal vein. Femoral vein is noncompressible in its mid and distal segments, with some residual flow suggested on color Doppler. IMPRESSION: 1. Partially occlusive left femoral-popliteal DVT. 2. Isolated right mid femoral vein partially occlusive DVT. Electronically Signed   By: Lucrezia Europe M.D.   On: 04/29/2016 11:57   Dg Chest Port 1 View  Result Date: 04/28/2016 CLINICAL DATA:  Shortness of breath, weakness  and bilateral lower extremity edema. EXAM: PORTABLE CHEST 1 VIEW COMPARISON:  04/25/2016 and prior chest radiographs FINDINGS: Cardiomegaly and aortic valve replacement changes noted. New mild interstitial opacities are suggestive of interstitial edema. Right upper lobe airspace disease may represent focal edema or pneumonia. Trace bilateral pleural effusions are noted. There is no evidence of pneumothorax or acute bony abnormality. IMPRESSION: Cardiomegaly with new mild interstitial opacities -question mild interstitial edema. Right upper lobe airspace disease-question focal edema versus pneumonia. Electronically Signed   By: Margarette Canada M.D.   On: 04/28/2016 17:35    Time Spent in minutes  30   Rocquel Askren K M.D on 04/29/2016 at 1:33 PM  Between 7am to 7pm - Pager - [phone]  After 7pm go to [email or web address] - password [facility]  [facility] -  Office   [phone]

## 2016-04-29 NOTE — Progress Notes (Signed)
CRITICAL VALUE ALERT  Critical value received:  Troponin 0.05 Date of notification:  04/29/2016  Time of notification: [id number]  Critical value read back:Yes  Nurse who received alert:  Vista Deck  MD notified (1st page):  1350  Time of first page:  1350

## 2016-04-29 NOTE — Progress Notes (Signed)
*  PRELIMINARY RESULTS* Echocardiogram 2D Echocardiogram has been performed.  Samuel Germany 04/29/2016, 10:01 AM

## 2016-04-29 NOTE — Evaluation (Signed)
Physical Therapy Evaluation Patient Details Name: [name] MRN: [id number] DOB: 02/11/1951 Today's Date: 04/29/2016   History of Present Illness  54M with complex PMH including ?recent undetermined lesions (?2 spots on brain/lung per patient; liver lesion per PET order), previous paroxysmal atrial fib (CHADSVASC 3), AS s/p mechanical 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 US done today with  Partially occlusive left femoral-popliteal DVT, and  Isolated right mid femoral vein partially occlusive DVT - Coumadin with heparin bridging for now..  Clinical Impression  Pt received in bed, and was marginally agreeable to PT evaluation.  Pt expressed that he lives alone, and that he normally independent with ambulation, ADL's, and driving.  However, today, he was confused in regards to the date, and required increased processing time for all commands.  He was able to perform supine<>sit with Min A.  However, he was not able to to perform sit<>stand despite 2 trials.  He expressed that he was just too fatigued from all the testing earlier in the day.  He was able to perform lateral scoot along the EOB interdependently, and then Min A for sit<>supine.  Pt reported that he has had 3-4 falls in the past 6 months, and he lives alone.  He is not safe to d/c home due to increased risk for recurrent falls, as well as diminished cognition at this time.  Therefore, recommend SNF to progress his strength, and functional mobility. Recommend OT consult for ADL's.     Follow Up Recommendations SNF    Equipment Recommendations  None recommended by PT    Recommendations for Other Services OT consult     Precautions / Restrictions Precautions Precautions: Fall Precaution Comments: Pt reports 3-4 falls in the past 6 months.  Restrictions Weight Bearing Restrictions: No      Mobility  Bed Mobility Overal bed mobility: Needs Assistance Bed Mobility: Sidelying to Sit;Sit to Sidelying;Rolling Rolling: Supervision Sidelying to sit: Min assist (Vc's for hand placement as pt was reaching for PT)     Sit to sidelying: Min assist (To lift LE's into the bed and get positioned in the bed. ) General bed mobility comments: Pt is extremely slow with all functional mobility, and requires cues for hand placement.  Attempted sit<>stand, but pt was not able.  Pt was able to perform lateral seated scoot along the EOB in order to get into position for transfer back into the bed.    Transfers Overall transfer level: Needs assistance Equipment used: Rolling walker (2 wheeled)             General transfer comment: Attempted x2, however pt was not able to lift buttocks off EOB - However pt expressed that he was extremely fatigued from all the testing today.    Ambulation/Gait                Stairs            Wheelchair Mobility    Modified Rankin (Stroke Patients Only)       Balance Overall balance assessment: History of Falls;Needs assistance Sitting-balance support: Bilateral upper extremity supported;Feet supported Sitting balance-Leahy Scale: Good  Pertinent Vitals/Pain Pain Assessment: 0-10 Pain Score: 8  Pain Location: thighs and calves bilaterally   Pain Descriptors / Indicators: Sharp Pain Intervention(s): Limited activity within patient's tolerance;Monitored during session;Repositioned    Home Living   Living Arrangements: Alone   Type of Home: House Home Access: Stairs to enter   Technical brewer of Steps: 3  Home  Layout: One level Home Equipment: Shower seat;Bedside commode      Prior Function     Gait / Transfers Assistance Needed: Pt states that he would walk short distances and would get SOB.  Pt uses the electric scooter at the grocery store.   ADL's / Homemaking Assistance Needed: independent with dressing, and bathing.  Pt states that he was driving a week ago,         Hand Dominance   Dominant Hand: Right    Extremity/Trunk Assessment   Upper Extremity Assessment Upper Extremity Assessment: Generalized weakness    Lower Extremity Assessment Lower Extremity Assessment: Generalized weakness    Cervical / Trunk Assessment Cervical / Trunk Assessment: Kyphotic  Communication      Cognition Arousal/Alertness: Lethargic Behavior During Therapy: Flat affect Overall Cognitive Status: Impaired/Different from baseline Area of Impairment: Orientation Orientation Level: Time (States the year is "one" )                  General Comments      Exercises     Assessment/Plan    PT Assessment Patient needs continued PT services  PT Problem List Decreased strength;Decreased activity tolerance;Decreased balance;Decreased mobility;Decreased cognition;Decreased knowledge of use of DME;Decreased safety awareness;Cardiopulmonary status limiting activity;Obesity;Decreased skin integrity;Pain;Decreased knowledge of precautions;Decreased coordination          PT Treatment Interventions DME instruction;Gait training;Functional mobility training;Therapeutic activities;Therapeutic exercise;Balance training;Neuromuscular re-education;Patient/family education;Wheelchair mobility training    PT Goals (Current goals can be found in the Care Plan section)  Acute Rehab PT Goals Patient Stated Goal: To get stronger PT Goal Formulation: With patient Time For Goal Achievement: 05/13/16 Potential to Achieve Goals: Fair    Frequency Min 3X/week   Barriers to discharge Decreased caregiver  support Pt lives alone    Co-evaluation               End of Session Equipment Utilized During Treatment: Gait belt;Oxygen Activity Tolerance: Patient limited by fatigue;Patient limited by pain Patient left: in bed;with call bell/phone within reach Nurse Communication: Mobility status;Need for lift equipment (mobility sheet left hanging in the room, recommend Maxi move at this point due to inability to stand today. )    Functional Assessment Tool Used: [place] "6-clicks"  Functional Limitation: Mobility: Walking and moving around Mobility: Walking and Moving Around Current Status [phone]): At least 40 percent but less than 60 percent impaired, limited or restricted Mobility: Walking and Moving Around Goal Status [phone]): At least 20 percent but less than 40 percent impaired, limited or restricted    Time: 1401-1426 PT Time Calculation (min) (ACUTE ONLY): 25 min   Charges:   PT Evaluation $PT Eval Moderate Complexity: 1 Procedure PT Treatments $Gait Training: 8-22 mins   PT G Codes:   PT G-Codes **NOT FOR INPATIENT CLASS** Functional Assessment Tool Used: [employer] "6-clicks"  Functional Limitation: Mobility: Walking and moving around Mobility: Walking and Moving Around Current Status [phone]): At least 40 percent but less than 60 percent impaired, limited or restricted Mobility: Walking and Moving Around Goal Status [phone]): At least 20 percent but less than 40 percent impaired, limited  or restricted   Tacy Learn, PT, DPT X: [phone]

## 2016-04-30 ENCOUNTER — Inpatient Hospital Stay (HOSPITAL_COMMUNITY): Payer: Medicare HMO

## 2016-04-30 LAB — CBC
HCT: 28.6 % — ABNORMAL LOW (ref 39.0–52.0)
Hemoglobin: 9.1 g/dL — ABNORMAL LOW (ref 13.0–17.0)
MCH: 29.7 pg (ref 26.0–34.0)
MCHC: 31.8 g/dL (ref 30.0–36.0)
MCV: 93.5 fL (ref 78.0–100.0)
PLATELETS: 171 10*3/uL (ref 150–400)
RBC: 3.06 MIL/uL — ABNORMAL LOW (ref 4.22–5.81)
RDW: 15.7 % — AB (ref 11.5–15.5)
WBC: 6.2 10*3/uL (ref 4.0–10.5)

## 2016-04-30 LAB — BASIC METABOLIC PANEL
Anion gap: 7 (ref 5–15)
BUN: 19 mg/dL (ref 6–20)
CALCIUM: 7.7 mg/dL — AB (ref 8.9–10.3)
CO2: 23 mmol/L (ref 22–32)
CREATININE: 1.13 mg/dL (ref 0.61–1.24)
Chloride: 107 mmol/L (ref 101–111)
GFR calc Af Amer: >60 mL/min (ref >60)
GLUCOSE: 78 mg/dL (ref 65–99)
Potassium: 3.4 mmol/L — ABNORMAL LOW (ref 3.5–5.1)
SODIUM: 137 mmol/L (ref 135–145)

## 2016-04-30 LAB — PROTIME-INR
INR: 1.72
PROTHROMBIN TIME: 20.4 s — AB (ref 11.4–15.2)

## 2016-04-30 LAB — HEPARIN LEVEL (UNFRACTIONATED)

## 2016-04-30 LAB — APTT: aPTT: 99 seconds — ABNORMAL HIGH (ref 24–36)

## 2016-04-30 MED ORDER — ENOXAPARIN SODIUM 100 MG/ML ~~LOC~~ SOLN
100.0000 mg | Freq: Two times a day (BID) | SUBCUTANEOUS | Status: DC
  Administered 2016-04-30 – 2016-05-01 (×4): 100 mg via SUBCUTANEOUS
  Filled 2016-04-30 (×4): qty 1

## 2016-04-30 MED ORDER — POTASSIUM CHLORIDE CRYS ER 20 MEQ PO TBCR
40.0000 meq | EXTENDED_RELEASE_TABLET | Freq: Four times a day (QID) | ORAL | Status: AC
  Administered 2016-04-30 (×2): 40 meq via ORAL
  Filled 2016-04-30 (×2): qty 2

## 2016-04-30 MED ORDER — FUROSEMIDE 40 MG PO TABS
40.0000 mg | ORAL_TABLET | Freq: Every day | ORAL | Status: DC
  Administered 2016-04-30 – 2016-05-02 (×3): 40 mg via ORAL
  Filled 2016-04-30 (×3): qty 1

## 2016-04-30 MED ORDER — WARFARIN SODIUM 5 MG PO TABS
10.0000 mg | ORAL_TABLET | Freq: Once | ORAL | Status: AC
  Administered 2016-04-30: 10 mg via ORAL
  Filled 2016-04-30: qty 2

## 2016-04-30 NOTE — Clinical Social Work Note (Signed)
Clinical Social Work Assessment  Patient Details  Name: NAOL DIFABIO MRN: [id number] Date of Birth: 06/05/1950  Date of referral:  04/30/16               Reason for consult:  Discharge Planning                Permission sought to share information with:    Permission granted to share information::     Name::        Agency::     Relationship::     Contact Information:     Housing/Transportation Living arrangements for the past 2 months:  Single Family Home Source of Information:  Patient Patient Interpreter Needed:  None Criminal Activity/Legal Involvement Pertinent to Current Situation/Hospitalization:  No - Comment as needed Significant Relationships:  Adult Children Lives with:  Self Do you feel safe going back to the place where you live?  Yes (Desires SNF.) Need for family participation in patient care:  Yes (Comment)  Care giving concerns:  None identified at baseline. Patient is independent.    Social Worker assessment / plan:  Patient lives alone, drives, uses a cane when he needs to and is independent in his ADLs at baseline. Over the past two weeks patient has not been able to do anything but sit in his recliner.  Patient is agreeable to SNF in [place] or [place].   Employment status:  Disabled (Comment on whether or not currently receiving Disability) Insurance information:  Managed Medicare PT Recommendations:  [place] / Referral to community resources:  [place], [place]  Patient/Family's Response to care:  Patient is agreeable to go to SNF.  Patient/Family's Understanding of and Emotional Response to Diagnosis, Current Treatment, and Prognosis: Patient understands his diagnosis, treatment and prognosis.   Emotional Assessment Appearance:  Appears stated age Attitude/Demeanor/Rapport:   (Cooperative) Affect (typically observed):  Accepting Orientation:  Oriented to Self, Oriented to Place,  Oriented to  Time, Oriented to Situation Alcohol / Substance use:  Not Applicable Psych involvement (Current and /or in the community):  No (Comment)  Discharge Needs  Concerns to be addressed:  Discharge Planning Concerns Readmission within the last 30 days:  No Current discharge risk:  Lives alone Barriers to Discharge:  Other (PASRR number is pending)   Ihor Gully, LCSW 04/30/2016, 12:19 PM

## 2016-04-30 NOTE — NC FL2 (Signed)
[place] LEVEL OF CARE SCREENING TOOL     IDENTIFICATION  Patient Name: [name] Birthdate: 1951/02/26 Sex: male Admission Date (Current Location): 04/28/2016  [facility] and [place] Number:  [employer] and Address:  [facility] [address], [place]      Provider Number: [id number]  Attending Physician Name and Address:  Thurnell Lose, MD  Relative Name and Phone Number:       Current Level of Care: Hospital Recommended Level of Care: [place] Prior Approval Number:    Date Approved/Denied:   PASRR Number:    Discharge Plan: SNF    Current Diagnoses: Patient Active Problem List   Diagnosis Date Noted  . Ventricular bigeminy 04/29/2016  . Back pain 04/29/2016  . Elevated troponin 04/29/2016  . NSTEMI (non-ST elevated myocardial infarction) ([place]) 04/29/2016  . NSTEMI (non-ST elevation myocardial infarction) ([place]) 04/28/2016  . Hypokalemia 04/28/2016  . Anemia 04/28/2016  . Encounter for therapeutic drug monitoring 04/30/2013  . Palpitations 11/23/2012  . Chest pain 07/23/2012  . Essential hypertension, benign 02/20/2012  . Aortic aneurysm, including pseudoaneurysm ([place])   . Atrial fibrillation ([place]) 09/11/2011  . Long term current use of anticoagulant therapy 08/30/2011  . S/P AVR 08/28/2011  . Coronary atherosclerosis of native coronary artery   . Aortic stenosis   . DYSPNEA 03/28/2009  . Hyperlipidemia 12/19/2008    Orientation RESPIRATION BLADDER Height & Weight     Self, Time, Situation, Place  O2 (2L) Incontinent Weight: 211 lb (95.7 kg) (Bed scale used. ) Height:  5\' 11"  (180.3 cm)  BEHAVIORAL SYMPTOMS/MOOD NEUROLOGICAL BOWEL NUTRITION STATUS      Continent Diet (Heart Healthy)  AMBULATORY STATUS COMMUNICATION OF NEEDS Skin   Limited Assist Verbally Normal                       Personal Care Assistance Level of Assistance  Bathing, Feeding, Dressing Bathing Assistance:  Limited assistance Feeding assistance: Independent Dressing Assistance: Limited assistance     Functional Limitations Info  Sight, Hearing, Speech Sight Info: Adequate Hearing Info: Adequate Speech Info: Adequate    SPECIAL CARE FACTORS FREQUENCY  PT (By licensed PT)     PT Frequency: 5x/week              Contractures Contractures Info: Not present    Additional Factors Info  Code Status, Allergies, Psychotropic Code Status Info: DNR Allergies Info: Bupropion, Atorvastatin Psychotropic Info: Xanax, Cymbalta, Neurontin, Desyrel         Current Medications (04/30/2016):  This is the current hospital active medication list Current Facility-Administered Medications  Medication Dose Route Frequency Provider Last Rate Last Dose  . acetaminophen (TYLENOL) tablet 650 mg  650 mg Oral Q4H PRN Karmen Bongo, MD   650 mg at 04/30/16 0311  . ALPRAZolam Duanne Moron) tablet 0.5 mg  0.5 mg Oral BID Thurnell Lose, MD   0.5 mg at 04/30/16 0944  . antiseptic oral rinse (BIOTENE) solution 15 mL  15 mL Mouth Rinse TID Karmen Bongo, MD   15 mL at 04/30/16 0944  . aspirin EC tablet 81 mg  81 mg Oral Daily Karmen Bongo, MD   81 mg at 04/30/16 0944  . atorvastatin (LIPITOR) tablet 80 mg  80 mg Oral Daily Karmen Bongo, MD   80 mg at 04/30/16 0944  . baclofen (LIORESAL) tablet 5 mg  5 mg Oral TID Thurnell Lose, MD   5  mg at 04/30/16 0944  . DULoxetine (CYMBALTA) DR capsule 60 mg  60 mg Oral Daily Karmen Bongo, MD   60 mg at 04/30/16 0944  . enoxaparin (LOVENOX) injection 100 mg  100 mg Subcutaneous Q12H Thurnell Lose, MD   100 mg at 04/30/16 1149  . folic acid (FOLVITE) tablet 1 mg  1 mg Oral Daily Karmen Bongo, MD   1 mg at 04/30/16 0944  . furosemide (LASIX) tablet 40 mg  40 mg Oral Daily Thurnell Lose, MD   40 mg at 04/30/16 0946  . HYDROcodone-acetaminophen (NORCO/VICODIN) 5-325 MG per tablet 1 tablet  1 tablet Oral Q6H PRN Thurnell Lose, MD   1 tablet at 04/30/16 1213  .  levothyroxine (SYNTHROID, LEVOTHROID) tablet 100 mcg  100 mcg Oral QAC breakfast Karmen Bongo, MD   100 mcg at 04/30/16 [id number]  . lisinopril (PRINIVIL,ZESTRIL) tablet 2.5 mg  2.5 mg Oral Daily Karmen Bongo, MD   2.5 mg at 04/30/16 0944  . metoprolol tartrate (LOPRESSOR) tablet 25 mg  25 mg Oral TID Karmen Bongo, MD   25 mg at 04/30/16 0946  . nitroGLYCERIN (NITROSTAT) SL tablet 0.4 mg  0.4 mg Sublingual Q5 Min x 3 PRN Karmen Bongo, MD      . ondansetron [facility]) injection 4 mg  4 mg Intravenous Q6H PRN Karmen Bongo, MD   4 mg at 04/29/16 2154  . pantoprazole (PROTONIX) EC tablet 40 mg  40 mg Oral Daily Karmen Bongo, MD   40 mg at 04/30/16 0944  . potassium chloride SA (K-DUR,KLOR-CON) CR tablet 40 mEq  40 mEq Oral Q6H Thurnell Lose, MD   40 mEq at 04/30/16 0945  . warfarin (COUMADIN) tablet 10 mg  10 mg Oral Once Thurnell Lose, MD      . warfarin (COUMADIN) tablet 10 mg  10 mg Oral Once Thurnell Lose, MD      . Warfarin - Pharmacist Dosing Inpatient   Does not apply Q24H Karmen Bongo, MD         Discharge Medications: Please see discharge summary for a list of discharge medications.  Relevant Imaging Results:  Relevant Lab Results:   Additional Information SSN [id number] Clydene Pugh, LCSW

## 2016-04-30 NOTE — Progress Notes (Signed)
PROGRESS NOTE                                                                                                                                                                                                             Patient Demographics:    [name], is a 66 y.o. male, DOB - 06/19/1950, KN:[id number]  Admit date - 04/28/2016   Admitting Physician Karmen Bongo, MD  Outpatient Primary MD for the patient is Gar Ponto, MD  LOS - 1  Chief Complaint  Patient presents with  . Shortness of Breath       Brief Narrative   [name] is a 66 y.o. male with medical history significant of CVA, PVD, chronic pain, MDD, HLD, CAD, afib, and s/p AVR on anticoagulation who was admitted to the hospital with generalized abdominal pain more so in his lower back and both lower extremities. Patient saying that he is unable to walk. Note pain has been ongoing for several years.  He had come to the ER 1 week ago and had left AMA, wanted to leave AMA again yesterday and refused all Lab draws and Ct scans, this morning he is more calm after counseling and has agreed to undergo testing and stay.   Subjective:    [name] today has, No headache, No chest pain, No abdominal pain - No Nausea, No new weakness tingling or numbness, No Cough - SOB. Has generalized pain all over, chronic low back pain ongoing for several years.   Assessment  & Plan :    1.Low back pain. Bilateral lower extremity weakness. Per patient the symptoms have been present for a few years but worse over the last few weeks, per patient he was also being worked up for some spots in his liver and brain.  At this time will do CT scan T and L-spine to rule out any pathological fracture or metastasis ( he refused yesterday buy now agreeable), initiate PT and pain control. If stable further workup per PCP. He claims he is due for an PET scan as well.  2. Nonspecific chest pain,  currently resolved. Upon in trend unremarkable and in non-ACS pattern. Echo shows preserved EF of 60% without any wall motion abnormality, cardiology on board, continue aspirin, statin and beta blocker for secondary prevention along with Coumadin.  3. GERD. On  PPI.  4. Paroxysmal atrial fibrillation with mechanical aortic valve. Cardiology following, echo stable, continue beta blocker, Coumadin and heparin bridging.  5. Bilateral lower extremity DVT. Coumadin with heparin bridging for now.  6. Hypothyroidism. On Synthroid continue.  7. Hypertension. Continue combination of beta blocker and ACE inhibitor  8. Dyslipidemia. On statin.  9. Ventricular bigeminy. Monitor electrolytes. Asymptomatic, further workup per cardiology. He is symptom-free. EF more than 60%.   Family Communication  :  None  Code Status :  Full  Diet : Diet Heart Room service appropriate? Yes; Fluid consistency: Thin   Disposition Plan  :  TBD  Consults  :  Cards  Procedures  :    Lower extremity venous duplex - bilateral lower extremity DVT  TTE Mild LVH with LVEF 60-65%. Grade 2 diastolic dysfunction. Mildly thickened mitral leaflets with trivial mitral regurgitation. Mechanical prosthesis in aortic position as described above with grossly normal function, trivial aortic regurgitation, and a small perivalvular leak. Mild to moderate right ventricular dysfunction. Trivial tricuspid regurgitation.  CT T&L spine -     DVT Prophylaxis  :  Coumadin  Lab Results  Component Value Date   INR 1.72 04/30/2016   INR 1.43 04/29/2016   INR 1.39 04/28/2016     Lab Results  Component Value Date   PLT 171 04/30/2016    Inpatient Medications  Scheduled Meds: . ALPRAZolam  0.5 mg Oral BID  . antiseptic oral rinse  15 mL Mouth Rinse TID  . aspirin EC  81 mg Oral Daily  . atorvastatin  80 mg Oral Daily  . baclofen  5 mg Oral TID  . DULoxetine  60 mg Oral Daily  . folic acid  1 mg Oral Daily  .  levothyroxine  100 mcg Oral QAC breakfast  . lisinopril  2.5 mg Oral Daily  . metoprolol tartrate  25 mg Oral TID  . pantoprazole  40 mg Oral Daily  . potassium chloride  40 mEq Oral Once  . warfarin  10 mg Oral Once  . Warfarin - Pharmacist Dosing Inpatient   Does not apply Q24H   Continuous Infusions: . heparin 1,400 Units/hr (04/29/16 2204)   PRN Meds:.acetaminophen, HYDROcodone-acetaminophen, nitroGLYCERIN, ondansetron (ZOFRAN) IV  Antibiotics  :    Anti-infectives    None         Objective:   Vitals:   04/29/16 2045 04/29/16 2123 04/30/16 0430 04/30/16 0439  BP: (!) 91/45  (!) 100/54   Pulse: 76  82   Resp: 17  18   Temp: 98.6 F (37 C)  98.9 F (37.2 C)   TempSrc: Oral  Oral   SpO2: 96% 96% 93% 92%  Weight:      Height:        Wt Readings from Last 3 Encounters:  04/29/16 95.7 kg (211 lb)  04/25/16 74.4 kg (164 lb)  12/05/15 98.4 kg (217 lb)     Intake/Output Summary (Last 24 hours) at 04/30/16 0827 Last data filed at 04/30/16 0700  Gross per 24 hour  Intake           694.13 ml  Output              950 ml  Net          -255.87 ml     Physical Exam  Awake Alert, Oriented X 3, No new F.N deficits, Normal affect [place].AT,PERRAL Supple Neck,No JVD, No cervical lymphadenopathy appriciated.  Symmetrical Chest wall movement, Good air movement bilaterally,  CTAB RRR,No Gallops,Rubs, Positive metallic aortic valve murmur, No Parasternal Heave +ve B.Sounds, Abd Soft, No tenderness, No organomegaly appriciated, No rebound - guarding or rigidity. No Cyanosis, Clubbing or edema, No new Rash or bruise       Data Review:    CBC  Recent Labs Lab 04/25/16 1310 04/28/16 1654 04/29/16 0344 04/30/16 0414  WBC 8.3 7.0 6.3 6.2  HGB 11.9* 10.9* 10.0* 9.1*  HCT 36.7* 33.8* 30.6* 28.6*  PLT 106* 150 153 171  MCV 94.3 92.9 92.2 93.5  MCH 30.6 29.9 30.1 29.7  MCHC 32.4 32.2 32.7 31.8  RDW 15.6* 15.5 15.5 15.7*  LYMPHSABS 0.9 0.9  --   --   MONOABS 0.7 0.5   --   --   EOSABS 0.0 0.0  --   --   BASOSABS 0.0 0.0  --   --     Chemistries   Recent Labs Lab 04/25/16 1310 04/28/16 1654 04/29/16 0344 04/30/16 0414  NA 138 137 136 137  K 3.3* 3.2* 3.4* 3.4*  CL 103 106 107 107  CO2 25 22 21* 23  GLUCOSE 84 77 78 78  BUN 10 15 15 19   CREATININE 1.24 1.04 1.05 1.13  CALCIUM 8.6* 8.0* 7.5* 7.7*  MG  --  2.3  --   --   AST 26  --   --   --   ALT 11*  --   --   --   ALKPHOS 82  --   --   --   BILITOT 1.2  --   --   --    ------------------------------------------------------------------------------------------------------------------  Recent Labs  04/29/16 0344  CHOL 92  HDL 14*  LDLCALC 62  TRIG 80  CHOLHDL 6.6    Lab Results  Component Value Date   HGBA1C 6.1 (H) 08/13/2011   ------------------------------------------------------------------------------------------------------------------  Recent Labs  04/28/16 2122  TSH 1.287   ------------------------------------------------------------------------------------------------------------------ No results for input(s): VITAMINB12, FOLATE, FERRITIN, TIBC, IRON, RETICCTPCT in the last 72 hours.  Coagulation profile  Recent Labs Lab 04/25/16 1324 04/28/16 1654 04/29/16 0344 04/30/16 0414  INR 1.50 1.39 1.43 1.72    No results for input(s): DDIMER in the last 72 hours.  Cardiac Enzymes  Recent Labs Lab 04/28/16 2122 04/29/16 0344 04/29/16 1204  TROPONINI 0.06* 0.05* 0.05*   ------------------------------------------------------------------------------------------------------------------    Component Value Date/Time   BNP 443.0 (H) 04/28/2016 1658    Micro Results Recent Results (from the past 240 hour(s))  Culture, blood (routine x 2)     Status: None (Preliminary result)   Collection Time: 04/29/16 12:04 PM  Result Value Ref Range Status   Specimen Description BLOOD LEFT HAND  Final   Special Requests BOTTLES DRAWN AEROBIC AND ANAEROBIC 6CC  Final    Culture NO GROWTH < 24 HOURS  Final   Report Status PENDING  Incomplete  Culture, blood (routine x 2)     Status: None (Preliminary result)   Collection Time: 04/29/16 12:04 PM  Result Value Ref Range Status   Specimen Description BLOOD LEFT FOREARM  Final   Special Requests BOTTLES DRAWN AEROBIC AND ANAEROBIC 6CC  Final   Culture NO GROWTH < 24 HOURS  Final   Report Status PENDING  Incomplete    Radiology Reports Dg Chest 2 View  Result Date: 04/25/2016 CLINICAL DATA:  Productive cough, chest pain. EXAM: CHEST  2 VIEW COMPARISON:  Radiograph of February 24, 2016. FINDINGS: Stable cardiomegaly. Status post aortic valve repair. No pneumothorax or pleural effusion is  noted. Stable mild central pulmonary vascular congestion is noted. Bony thorax is unremarkable. IMPRESSION: Stable cardiomegaly and mild central pulmonary vascular congestion. Electronically Signed   By: Marijo Conception, M.D.   On: 04/25/2016 13:22   US Venous Img Lower Bilateral  Result Date: 04/29/2016 CLINICAL DATA:  Chronic bilateral pain, edema. Previous tobacco abuse. On Coumadin. EXAM: BILATERAL LOWER EXTREMITY VENOUS DOPPLER ULTRASOUND TECHNIQUE: Grabbe-scale sonography with compression, as well as color and duplex ultrasound, were performed to evaluate the deep venous system from the level of the common femoral vein through the popliteal and proximal calf veins. COMPARISON:  None FINDINGS: There is hypoechoic thrombus in the mid right femoral vein resulting in incompressibility. Some continued flow across this level on color Doppler. Normal compressibility of the common femoral and popliteal veins, as well as the proximal calf veins. Visualized segments of the saphenous venous system normal in caliber and compressibility. On the left, occlusive thrombus in the popliteal vein. Femoral vein is noncompressible in its mid and distal segments, with some residual flow suggested on color Doppler. IMPRESSION: 1. Partially occlusive left  femoral-popliteal DVT. 2. Isolated right mid femoral vein partially occlusive DVT. Electronically Signed   By: Lucrezia Europe M.D.   On: 04/29/2016 11:57   Dg Chest Port 1 View  Result Date: 04/28/2016 CLINICAL DATA:  Shortness of breath, weakness and bilateral lower extremity edema. EXAM: PORTABLE CHEST 1 VIEW COMPARISON:  04/25/2016 and prior chest radiographs FINDINGS: Cardiomegaly and aortic valve replacement changes noted. New mild interstitial opacities are suggestive of interstitial edema. Right upper lobe airspace disease may represent focal edema or pneumonia. Trace bilateral pleural effusions are noted. There is no evidence of pneumothorax or acute bony abnormality. IMPRESSION: Cardiomegaly with new mild interstitial opacities -question mild interstitial edema. Right upper lobe airspace disease-question focal edema versus pneumonia. Electronically Signed   By: Margarette Canada M.D.   On: 04/28/2016 17:35    Time Spent in minutes  30   SINGH,PRASHANT K M.D on 04/30/2016 at 8:27 AM  Between 7am to 7pm - Pager - [phone]  After 7pm go to [email or web address] - password [email or web address]  [facility] -  Office  [phone]

## 2016-04-30 NOTE — Progress Notes (Signed)
ANTICOAGULATION CONSULT NOTE - I  Pharmacy Consult for Coumadin (home med), lovenox until INR at goal Indication: mechanical valve  Allergies  Allergen Reactions  . Bupropion     ??  . Atorvastatin     ??   Patient Measurements: Height: 5\' 11"  (180.3 cm) Weight: 211 lb (95.7 kg) (Bed scale used. ) IBW/kg (Calculated) : 75.3  Vital Signs: Temp: 98.9 F (37.2 C) (01/30 0430) Temp Source: Oral (01/30 0430) BP: 100/54 (01/30 0430) Pulse Rate: 82 (01/30 0430)  Labs:  Recent Labs  04/28/16 1654 04/28/16 2122 04/29/16 0344 04/29/16 1204 04/30/16 0414  HGB 10.9*  --  10.0*  --  9.1*  HCT 33.8*  --  30.6*  --  28.6*  PLT 150  --  153  --  171  APTT  --   --   --   --  99*  LABPROT 17.2*  --  17.6*  --  20.4*  INR 1.39  --  1.43  --  1.72  HEPARINUNFRC  --   --   --   --  <0.10*  CREATININE 1.04  --  1.05  --  1.13  TROPONINI 0.06* 0.06* 0.05* 0.05*  --    Estimated Creatinine Clearance: 78 mL/min (by C-G formula based on SCr of 1.13 mg/dL).  Medical History: Past Medical History:  Diagnosis Date  . Anemia   . Anxiety   . Aortic stenosis    a. Status post [employer] mechanical AVR 5/13.  . Arthritis   . Chronic back pain    Lumbar spondylosis  . Coronary atherosclerosis of native coronary artery    a. Mild, nonobstructive dz by cath 07/2011.  . Fibromyalgia   . GERD (gastroesophageal reflux disease)   . Hx of colonic polyps   . Hyperlipidemia   . Hypotension   . Long-term memory loss   . Major depressive disorder    Prior suicidal attempts  . Nocturia   . Orthostasis   . PAF (paroxysmal atrial fibrillation) ([place])   . Pain management   . Peripheral vascular disease ([place])   . Seizures ([place])    6 yrs ago from withdrawal of Xanax to quickly  . Stroke ([place])   . 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  [name] Tinnitus    Following prior seizures   Medications:  Prescriptions Prior to Admission  Medication  Sig Dispense Refill Last Dose  . ALPRAZolam (XANAX) 1 MG tablet Take 1 mg by mouth 4 (four) times daily as needed for anxiety.    04/27/2016 at Unknown time  . atorvastatin (LIPITOR) 80 MG tablet Take 80 mg by mouth daily.   Past Week at Unknown time  . baclofen (LIORESAL) 10 MG tablet Take 10 mg by mouth 3 (three) times daily.   04/24/2016 at Unknown time  . DULoxetine (CYMBALTA) 60 MG capsule Take 60 mg by mouth daily.   Past Week at Unknown time  . folic acid (FOLVITE) 1 MG tablet Take 1 mg by mouth daily.   Past Week at Unknown time  . furosemide (LASIX) 20 MG tablet Take 20 mg by mouth 2 (two) times daily.   Past Month at Unknown time  . gabapentin (NEURONTIN) 300 MG capsule Take 600 mg by mouth 3 (three) times daily.   Past Week at Unknown time  . levothyroxine (SYNTHROID, LEVOTHROID) 100 MCG tablet Take 100 mcg by mouth daily before breakfast.   Past Week at Unknown  time  . metoprolol tartrate (LOPRESSOR) 25 MG tablet TAKE ONE TABLET BY MOUTH THREE TIMES DAILY (MORNING, NOON, & IN THE EVENING) 90 tablet 6 Past Week at Unknown time  . morphine (MSIR) 15 MG tablet Take 15 mg by mouth every 6 (six) hours.   Past Month at Unknown time  . NITROSTAT 0.4 MG SL tablet Place 0.4 mg under the tongue every 5 (five) minutes as needed for chest pain.    UNKNOWN  . pantoprazole (PROTONIX) 40 MG tablet Take 40 mg by mouth daily.     Past Week at Unknown time  . potassium chloride (K-DUR) 10 MEQ tablet Take 1 tablet (10 mEq total) by mouth daily. 90 tablet 3 Past Week at Unknown time  . promethazine (PHENERGAN) 25 MG tablet Take 25 mg by mouth every 6 (six) hours as needed for nausea or vomiting.    Past Week at Unknown time  . traZODone (DESYREL) 50 MG tablet Take 50 mg by mouth at bedtime as needed for sleep.   Past Week at Unknown time  . warfarin (COUMADIN) 5 MG tablet Take 2.5-5 mg by mouth See admin instructions. Alternate taking 2.5mg  with 5mg  every evening   Past Week at Unknown time  . Artificial  Saliva (BIOTENE MOISTURIZING MOUTH) SOLN Use as directed 1-2 sprays in the mouth or throat 3 (three) times daily.   04/24/2016 at Unknown time  . lisinopril (PRINIVIL,ZESTRIL) 2.5 MG tablet Take 1 tablet (2.5 mg total) by mouth daily. 90 tablet 3 04/24/2016 at Unknown time   Assessment: 66yo male on chronic Coumadin for h/o mechanical valve.  INR SUBtherapeutic on admission.  Patient refusing heparin, coumadin and blood draws yesterday. Heparin was restarted later in the evening. Heparin level subtherapeutic this AM. D/W Dr. Candiss Norse and Dr. Bronson Ing realizing it is not the optimal therapy with lovenox, but with patient refusing heparin therapy otherwise this affords an alternative without any intervention.  Goal of Therapy:  INR 2.5 - 3.5 Monitor platelets by anticoagulation protocol: Yes   Plan:  Coumadin 10mg  today  x 1 to boost INR Lovenox 1mg /kg (100mg ) sq q12h INR and CBC daily  Isac Sarna, BS Vena [place], BCPS Clinical Pharmacist Pager [phone] 04/30/2016,10:11 AM

## 2016-05-01 ENCOUNTER — Other Ambulatory Visit: Payer: Self-pay | Admitting: Licensed Clinical Social Worker

## 2016-05-01 DIAGNOSIS — I482 Chronic atrial fibrillation: Secondary | ICD-10-CM

## 2016-05-01 DIAGNOSIS — I214 Non-ST elevation (NSTEMI) myocardial infarction: Secondary | ICD-10-CM

## 2016-05-01 LAB — BASIC METABOLIC PANEL
Anion gap: 7 (ref 5–15)
BUN: 13 mg/dL (ref 6–20)
CALCIUM: 7.8 mg/dL — AB (ref 8.9–10.3)
CO2: 24 mmol/L (ref 22–32)
CREATININE: 1.1 mg/dL (ref 0.61–1.24)
Chloride: 105 mmol/L (ref 101–111)
Glucose, Bld: 104 mg/dL — ABNORMAL HIGH (ref 65–99)
Potassium: 3.6 mmol/L (ref 3.5–5.1)
Sodium: 136 mmol/L (ref 135–145)

## 2016-05-01 LAB — CBC
HEMATOCRIT: 31 % — AB (ref 39.0–52.0)
Hemoglobin: 9.6 g/dL — ABNORMAL LOW (ref 13.0–17.0)
MCH: 29.3 pg (ref 26.0–34.0)
MCHC: 31 g/dL (ref 30.0–36.0)
MCV: 94.5 fL (ref 78.0–100.0)
PLATELETS: 190 10*3/uL (ref 150–400)
RBC: 3.28 MIL/uL — ABNORMAL LOW (ref 4.22–5.81)
RDW: 15.4 % (ref 11.5–15.5)
WBC: 4.7 10*3/uL (ref 4.0–10.5)

## 2016-05-01 LAB — PROTIME-INR
INR: 2.33
PROTHROMBIN TIME: 25.9 s — AB (ref 11.4–15.2)

## 2016-05-01 MED ORDER — BISACODYL 10 MG RE SUPP: 10.0000 mg | Freq: Every day | RECTAL | Status: DC | PRN

## 2016-05-01 MED ORDER — SENNOSIDES-DOCUSATE SODIUM 8.6-50 MG PO TABS
2.0000 | ORAL_TABLET | Freq: Two times a day (BID) | ORAL | Status: DC
  Administered 2016-05-02: 2 via ORAL
  Filled 2016-05-01 (×2): qty 2

## 2016-05-01 MED ORDER — WARFARIN SODIUM 5 MG PO TABS
5.0000 mg | ORAL_TABLET | Freq: Once | ORAL | Status: AC
  Administered 2016-05-01: 5 mg via ORAL
  Filled 2016-05-01: qty 1

## 2016-05-01 NOTE — Progress Notes (Signed)
PROGRESS NOTE                                                                                                                                                                                                             Patient Demographics:    [name], is a 66 y.o. male, DOB - 02/19/1951, XA:[id number]  Admit date - 04/28/2016   Admitting Physician Karmen Bongo, MD  Outpatient Primary MD for the patient is Gar Ponto, MD  LOS - 2  Chief Complaint  Patient presents with  . Shortness of Breath       Brief Narrative   [name] is a 66 y.o. male with medical history significant of CVA, PVD, chronic pain, MDD, HLD, CAD, afib, and s/p AVR on anticoagulation who was admitted to the hospital with generalized abdominal pain more so in his lower back and both lower extremities. Patient saying that he is unable to walk. Note pain has been ongoing for several years.  He had come to the ER 1 week ago and had left AMA, wanted to leave AMA again yesterday and refused all Lab draws and Ct scans, this morning he is more calm after counseling and has agreed to undergo testing and stay.   Subjective:    [name] today has, No headache, No chest pain, No abdominal pain - No Nausea, No new weakness tingling or numbness, No Cough - SOB. Has generalized pain all over, chronic low back pain ongoing for several years.   Assessment  & Plan :    1.Low back pain. Bilateral lower extremity weakness. Per patient the symptoms have been present for a few years but worse over the last few weeks, per patient he was also being worked up for some spots in his liver and brain.  At this time will do CT scan T and L-spine to rule out any patholor an PET scan as well.  No acute findings found on T and L spine imaging.   2. Nonspecific chest pain, currently resolved. Upon in trend unremarkable and in non-ACS pattern. Echo shows preserved EF of 60%  without any wall motion abnormality, cardiology on board, continue aspirin, statin and beta blocker for secondary prevention along with Coumadin. gical fracture or metastasis ( he refused initially buy now agreeable), initiate PT and pain control. If stable further  workup per PCP. He claims he is due fo 3. GERD. On PPI.  4. Paroxysmal atrial fibrillation with mechanical aortic valve. Cardiology following, echo stable, continue beta blocker, Coumadin and lovenox bridging (pt has refused heparin).  5. Bilateral lower extremity DVT. Coumadin with lovenox bridging for now.  6. Hypothyroidism. On Synthroid continue.  7. Hypertension. Continue combination of beta blocker and ACE inhibitor  8. Dyslipidemia. On statin.  9. Ventricular bigeminy. Monitor electrolytes. Asymptomatic, further workup per cardiology. He is symptom-free. EF more than 60%.   Family Communication  :  None  Code Status :  Full  Diet : Diet Heart Room service appropriate? Yes; Fluid consistency: Thin   Disposition Plan  :  SNF 2/1  Consults  :  Cards  Procedures  :    Lower extremity venous duplex - bilateral lower extremity DVT  TTE Mild LVH with LVEF 60-65%. Grade 2 diastolic dysfunction. Mildly thickened mitral leaflets with trivial mitral regurgitation. Mechanical prosthesis in aortic position as described above with grossly normal function, trivial aortic regurgitation, and a small perivalvular leak. Mild to moderate right ventricular dysfunction. Trivial tricuspid regurgitation.  CT T&L spine -   DVT Prophylaxis  :  Coumadin  Lab Results  Component Value Date   INR 2.33 05/01/2016   INR 1.72 04/30/2016   INR 1.43 04/29/2016   ROS: as per HPI otherwise all systems reviewed and negative  Lab Results  Component Value Date   PLT 190 05/01/2016    Inpatient Medications  Scheduled Meds: . ALPRAZolam  0.5 mg Oral BID  . antiseptic oral rinse  15 mL Mouth Rinse TID  . aspirin EC  81 mg Oral Daily  .  atorvastatin  80 mg Oral Daily  . baclofen  5 mg Oral TID  . DULoxetine  60 mg Oral Daily  . enoxaparin (LOVENOX) injection  100 mg Subcutaneous Q12H  . folic acid  1 mg Oral Daily  . furosemide  40 mg Oral Daily  . levothyroxine  100 mcg Oral QAC breakfast  . lisinopril  2.5 mg Oral Daily  . metoprolol tartrate  25 mg Oral TID  . pantoprazole  40 mg Oral Daily  . warfarin  5 mg Oral Once  . Warfarin - Pharmacist Dosing Inpatient   Does not apply Q24H   Continuous Infusions:  PRN Meds:.acetaminophen, HYDROcodone-acetaminophen, nitroGLYCERIN, ondansetron (ZOFRAN) IV  Antibiotics  :    Anti-infectives    None         Objective:   Vitals:   04/30/16 0439 04/30/16 1626 04/30/16 2047 05/01/16 0430  BP:  (!) 110/50 (!) 91/59 (!) 129/56  Pulse:  77 64 86  Resp:  20 18 18   Temp:  98.7 F (37.1 C) 98.5 F (36.9 C) 98.7 F (37.1 C)  TempSrc:  Oral Oral Oral  SpO2: 92% 90% 93% 92%  Weight:  95.5 kg (210 lb 8 oz)  95.2 kg (209 lb 14.1 oz)  Height:        Wt Readings from Last 3 Encounters:  05/01/16 95.2 kg (209 lb 14.1 oz)  04/25/16 74.4 kg (164 lb)  12/05/15 98.4 kg (217 lb)     Intake/Output Summary (Last 24 hours) at 05/01/16 1000 Last data filed at 05/01/16 0500  Gross per 24 hour  Intake            50.87 ml  Output              400 ml  Net          -  349.13 ml   Physical Exam  Awake Alert, Oriented X 3, No new F.N deficits, Normal affect Oto.AT,PERRAL Supple Neck,No JVD, No cervical lymphadenopathy appriciated.  Symmetrical Chest wall movement, Good air movement bilaterally, CTAB RRR,No Gallops,Rubs, Positive metallic aortic valve murmur, No Parasternal Heave +ve B.Sounds, Abd Soft, No tenderness, No organomegaly appriciated, No rebound - guarding or rigidity. No Cyanosis, Clubbing or edema, No new Rash or bruise       Data Review:    CBC  Recent Labs Lab 04/25/16 1310 04/28/16 1654 04/29/16 0344 04/30/16 0414 05/01/16 0431  WBC 8.3 7.0 6.3 6.2  4.7  HGB 11.9* 10.9* 10.0* 9.1* 9.6*  HCT 36.7* 33.8* 30.6* 28.6* 31.0*  PLT 106* 150 153 171 190  MCV 94.3 92.9 92.2 93.5 94.5  MCH 30.6 29.9 30.1 29.7 29.3  MCHC 32.4 32.2 32.7 31.8 31.0  RDW 15.6* 15.5 15.5 15.7* 15.4  LYMPHSABS 0.9 0.9  --   --   --   MONOABS 0.7 0.5  --   --   --   EOSABS 0.0 0.0  --   --   --   BASOSABS 0.0 0.0  --   --   --     Chemistries   Recent Labs Lab 04/25/16 1310 04/28/16 1654 04/29/16 0344 04/30/16 0414 05/01/16 0431  NA 138 137 136 137 136  K 3.3* 3.2* 3.4* 3.4* 3.6  CL 103 106 107 107 105  CO2 25 22 21* 23 24  GLUCOSE 84 77 78 78 104*  BUN 10 15 15 19 13   CREATININE 1.24 1.04 1.05 1.13 1.10  CALCIUM 8.6* 8.0* 7.5* 7.7* 7.8*  MG  --  2.3  --   --   --   AST 26  --   --   --   --   ALT 11*  --   --   --   --   ALKPHOS 82  --   --   --   --   BILITOT 1.2  --   --   --   --    ------------------------------------------------------------------------------------------------------------------  Recent Labs  04/29/16 0344  CHOL 92  HDL 14*  LDLCALC 62  TRIG 80  CHOLHDL 6.6    Lab Results  Component Value Date   HGBA1C 6.1 (H) 08/13/2011   ------------------------------------------------------------------------------------------------------------------  Recent Labs  04/28/16 2122  TSH 1.287   ------------------------------------------------------------------------------------------------------------------ No results for input(s): VITAMINB12, FOLATE, FERRITIN, TIBC, IRON, RETICCTPCT in the last 72 hours.  Coagulation profile  Recent Labs Lab 04/25/16 1324 04/28/16 1654 04/29/16 0344 04/30/16 0414 05/01/16 0431  INR 1.50 1.39 1.43 1.72 2.33    No results for input(s): DDIMER in the last 72 hours.  Cardiac Enzymes  Recent Labs Lab 04/28/16 2122 04/29/16 0344 04/29/16 1204  TROPONINI 0.06* 0.05* 0.05*    ------------------------------------------------------------------------------------------------------------------    Component Value Date/Time   BNP 443.0 (H) 04/28/2016 1658    Micro Results Recent Results (from the past 240 hour(s))  Culture, blood (routine x 2)     Status: None (Preliminary result)   Collection Time: 04/29/16 12:04 PM  Result Value Ref Range Status   Specimen Description BLOOD LEFT HAND  Final   Special Requests BOTTLES DRAWN AEROBIC AND ANAEROBIC 6CC  Final   Culture NO GROWTH 2 DAYS  Final   Report Status PENDING  Incomplete  Culture, blood (routine x 2)     Status: None (Preliminary result)   Collection Time: 04/29/16 12:04 PM  Result Value Ref Range Status  Specimen Description BLOOD LEFT FOREARM  Final   Special Requests BOTTLES DRAWN AEROBIC AND ANAEROBIC [place]  Final   Culture NO GROWTH 2 DAYS  Final   Report Status PENDING  Incomplete    Radiology Reports Dg Chest 2 View  Result Date: 04/25/2016 CLINICAL DATA:  Productive cough, chest pain. EXAM: CHEST  2 VIEW COMPARISON:  Radiograph of February 24, 2016. FINDINGS: Stable cardiomegaly. Status post aortic valve repair. No pneumothorax or pleural effusion is noted. Stable mild central pulmonary vascular congestion is noted. Bony thorax is unremarkable. IMPRESSION: Stable cardiomegaly and mild central pulmonary vascular congestion. Electronically Signed   By: Marijo Conception, M.D.   On: 04/25/2016 13:22   Ct Thoracic Spine Wo Contrast  Result Date: 04/30/2016 CLINICAL DATA:  Chronic low back and bilateral lower extremity pain, worse recently. Unable to walk in the past 2-3 weeks. EXAM: CT THORACIC AND LUMBAR SPINE WITHOUT CONTRAST TECHNIQUE: Multidetector CT imaging of the thoracic and lumbar spine was performed without contrast. Multiplanar CT image reconstructions were also generated. COMPARISON:  CT abdomen and pelvis 02/24/2016. Chest CTA 05/04/2013. FINDINGS: CT THORACIC SPINE FINDINGS Alignment:  Slight right convex curvature of the upper thoracic spine. No listhesis. Vertebrae: No fracture or destructive osseous lesion identified. Paraspinal and other soft tissues: Aortic and three-vessel coronary artery atherosclerosis. Prior aortic valve replacement. Trace right and small left pleural effusions. Prominent confluent pulmonary opacity in the right upper lobe. Milder, patchy opacities partially visualized elsewhere bilaterally, with assessment limited by motion artifact. Disc levels: Mild thoracic spondylosis at evidenced by mild endplate osteophyte formation predominantly in the lower thoracic spine, greatest at T10-11. Very mild lower thoracic facet arthrosis is also greatest at T10-11. No evidence of osseous spinal or neural foraminal stenosis. CT LUMBAR SPINE FINDINGS Segmentation: Transitional lumbosacral anatomy with partial lumbarization of S1. Rudimentary S1-2 disc. Hypoplastic ribs bilaterally at L1. Alignment: Normal. Vertebrae: No evidence of fracture or destructive osseous lesion. 1.6 cm sclerotic lesion with mild surrounding lucency in the posterior left ilium is unchanged from the prior abdominal CT and indeterminate. Paraspinal and other soft tissues: Retroperitoneal lymphadenopathy has progressed from the prior abdominal CT, with the largest lymph node measuring 3.3 cm in short axis in the left para-aortic region inferior to the renal artery. There is mild nonspecific retroperitoneal fat stranding which extends into the presacral space, incompletely visualized. A 2.4 cm portacaval lymph node is similar to the prior CT. Aortic atherosclerosis is noted. Disc levels: L1-2:  Negative. L2-3:  Negative. L3-4: Minimal disc bulging and mild right facet spurring without stenosis. L4-5: Mild disc bulging and mild bilateral facet arthrosis without stenosis. L5-S1: Mild disc bulging and severe right and mild left facet arthrosis result in mild right greater than left neural foraminal stenosis and  borderline to mild bilateral lateral recess stenosis without significant overall spinal stenosis. IMPRESSION: 1. No evidence of acute osseous abnormality in the thoracic or lumbar spine. 2. Very mild thoracic spondylosis without evidence of significant stenosis. 3. Transitional lumbosacral anatomy. 4. Mild lumbar spondylosis. Advanced right facet arthrosis at L5-S1 with mild neural foraminal stenosis. 5. Confluent right upper lobe pulmonary opacity concerning for pneumonia. Small pleural effusions. 6. Progressive retroperitoneal lymphadenopathy. 7. Aortic atherosclerosis. Electronically Signed   By: Logan Bores M.D.   On: 04/30/2016 11:41   Ct Lumbar Spine Wo Contrast  Result Date: 04/30/2016 CLINICAL DATA:  Chronic low back and bilateral lower extremity pain, worse recently. Unable to walk in the past 2-3 weeks. EXAM: CT THORACIC AND LUMBAR  SPINE WITHOUT CONTRAST TECHNIQUE: Multidetector CT imaging of the thoracic and lumbar spine was performed without contrast. Multiplanar CT image reconstructions were also generated. COMPARISON:  CT abdomen and pelvis 02/24/2016. Chest CTA 05/04/2013. FINDINGS: CT THORACIC SPINE FINDINGS Alignment: Slight right convex curvature of the upper thoracic spine. No listhesis. Vertebrae: No fracture or destructive osseous lesion identified. Paraspinal and other soft tissues: Aortic and three-vessel coronary artery atherosclerosis. Prior aortic valve replacement. Trace right and small left pleural effusions. Prominent confluent pulmonary opacity in the right upper lobe. Milder, patchy opacities partially visualized elsewhere bilaterally, with assessment limited by motion artifact. Disc levels: Mild thoracic spondylosis at evidenced by mild endplate osteophyte formation predominantly in the lower thoracic spine, greatest at T10-11. Very mild lower thoracic facet arthrosis is also greatest at T10-11. No evidence of osseous spinal or neural foraminal stenosis. CT LUMBAR SPINE FINDINGS  Segmentation: Transitional lumbosacral anatomy with partial lumbarization of S1. Rudimentary S1-2 disc. Hypoplastic ribs bilaterally at L1. Alignment: Normal. Vertebrae: No evidence of fracture or destructive osseous lesion. 1.6 cm sclerotic lesion with mild surrounding lucency in the posterior left ilium is unchanged from the prior abdominal CT and indeterminate. Paraspinal and other soft tissues: Retroperitoneal lymphadenopathy has progressed from the prior abdominal CT, with the largest lymph node measuring 3.3 cm in short axis in the left para-aortic region inferior to the renal artery. There is mild nonspecific retroperitoneal fat stranding which extends into the presacral space, incompletely visualized. A 2.4 cm portacaval lymph node is similar to the prior CT. Aortic atherosclerosis is noted. Disc levels: L1-2:  Negative. L2-3:  Negative. L3-4: Minimal disc bulging and mild right facet spurring without stenosis. L4-5: Mild disc bulging and mild bilateral facet arthrosis without stenosis. L5-S1: Mild disc bulging and severe right and mild left facet arthrosis result in mild right greater than left neural foraminal stenosis and borderline to mild bilateral lateral recess stenosis without significant overall spinal stenosis. IMPRESSION: 1. No evidence of acute osseous abnormality in the thoracic or lumbar spine. 2. Very mild thoracic spondylosis without evidence of significant stenosis. 3. Transitional lumbosacral anatomy. 4. Mild lumbar spondylosis. Advanced right facet arthrosis at L5-S1 with mild neural foraminal stenosis. 5. Confluent right upper lobe pulmonary opacity concerning for pneumonia. Small pleural effusions. 6. Progressive retroperitoneal lymphadenopathy. 7. Aortic atherosclerosis. Electronically Signed   By: Logan Bores M.D.   On: 04/30/2016 11:41   US Venous Img Lower Bilateral  Result Date: 04/29/2016 CLINICAL DATA:  Chronic bilateral pain, edema. Previous tobacco abuse. On Coumadin. EXAM:  BILATERAL LOWER EXTREMITY VENOUS DOPPLER ULTRASOUND TECHNIQUE: Matzke-scale sonography with compression, as well as color and duplex ultrasound, were performed to evaluate the deep venous system from the level of the common femoral vein through the popliteal and proximal calf veins. COMPARISON:  None FINDINGS: There is hypoechoic thrombus in the mid right femoral vein resulting in incompressibility. Some continued flow across this level on color Doppler. Normal compressibility of the common femoral and popliteal veins, as well as the proximal calf veins. Visualized segments of the saphenous venous system normal in caliber and compressibility. On the left, occlusive thrombus in the popliteal vein. Femoral vein is noncompressible in its mid and distal segments, with some residual flow suggested on color Doppler. IMPRESSION: 1. Partially occlusive left femoral-popliteal DVT. 2. Isolated right mid femoral vein partially occlusive DVT. Electronically Signed   By: Lucrezia Europe M.D.   On: 04/29/2016 11:57   Dg Chest Port 1 View  Result Date: 04/28/2016 CLINICAL DATA:  Shortness of  breath, weakness and bilateral lower extremity edema. EXAM: PORTABLE CHEST 1 VIEW COMPARISON:  04/25/2016 and prior chest radiographs FINDINGS: Cardiomegaly and aortic valve replacement changes noted. New mild interstitial opacities are suggestive of interstitial edema. Right upper lobe airspace disease may represent focal edema or pneumonia. Trace bilateral pleural effusions are noted. There is no evidence of pneumothorax or acute bony abnormality. IMPRESSION: Cardiomegaly with new mild interstitial opacities -question mild interstitial edema. Right upper lobe airspace disease-question focal edema versus pneumonia. Electronically Signed   By: Margarette Canada M.D.   On: 04/28/2016 17:35    Time Spent in minutes  24   Clanford Johnson M.D on 05/01/2016 at 10:00 AM  Between 7am to 7pm - Pager - [phone]  After 7pm go to [email or web address] -  password [facility]  [facility] -  Office  [phone]

## 2016-05-01 NOTE — Patient Outreach (Signed)
Assessment:  CSW spoke via phone with client. CSW verified client identity. CSW and client spoke of client needs. Client sees [name] as primary care doctor. Client said he has prescribed medications and is taking medications as prescribed. He said he is [facility] at [facility] in [place], [place] as scheduled. He said he is taking pain medications as prescribed. Client said he has a reliable car and can afford gas for car. Client said he has adequate food supply. He said he becomes fatigued periodically and has to take rest breaks as needed. Client uses a cane to help him with ambulation. Client receives frozen meals support from [facility] as scheduled. Client said that he receives a 2 week supply of frozen meals from [facility] as scheduled periodically. CSW and client  spoke of client care plan. CSW encouraged client to participate in scheduled client calls with [facility] counselor in next 30 days. Client said he had sometimes gone out to eat with a friend in past months.  Client said he enjoyed socializing with this friend.  Client has gone to Emergency room several times recently for care. He previously went to local emergency room but left Against Medical Advice from that emergency room.  He admitted on 04/28/16 to to [facility] for observation.  He is currently receiving care at [facility] in [place], [place].  Client had informed medical providers at the hospital that he sometimes had difficulty getting in and out of his recliner at his home.  He does live alone and wants to continue to live on his own at his home as able.  He has reduced family support.  He had also spoken with hospital medical providers about having some difficulty in walking as well. He said he is receiving medical tests while hospitalized.  He said he was not receiving oxygen at present and had come off of oxygen last night. He said he was talking with medical providers at [facility] about his discharge plan. He wants to try to go back home if he is able to do so with needed supports in place. CSW Nicki Reaper Latiesha Harada encouraged [place] to communicate with facility social worker to develop discharge plans for client. Client said he was still having difficulty in walking at present and had been mostly resting in the bed at [facility] encouraged client to call CSW at [phone] as needed to discuss social work needs of client. CSW thanked Edvin for communicating with CSW via phone on 05/01/16.  Again, client said he is working with hospital staff providers to try to determine discharge plan for client at present.    Plan:  Client to participate in scheduled client calls with [facility] counselor in next 30 days.  CSW to call client in 4 weeks to assess client needs at that time.   [name].Che Rachal MSW, LCSW Licensed Clinical Social Worker [facility] Care Management [phone]

## 2016-05-01 NOTE — Progress Notes (Signed)
ANTICOAGULATION CONSULT NOTE - I  Pharmacy Consult for Coumadin (home med), lovenox until INR at goal Indication: mechanical valve  Allergies  Allergen Reactions  . Bupropion     ??  . Atorvastatin     ??   Patient Measurements: Height: 5\' 11"  (180.3 cm) Weight: 209 lb 14.1 oz (95.2 kg) IBW/kg (Calculated) : 75.3  Vital Signs: Temp: 98.7 F (37.1 C) (01/31 0430) Temp Source: Oral (01/31 0430) BP: 129/56 (01/31 0430) Pulse Rate: 86 (01/31 0430)  Labs:  Recent Labs  04/28/16 2122 04/29/16 0344 04/29/16 1204 04/30/16 0414 05/01/16 0431  HGB  --  10.0*  --  9.1* 9.6*  HCT  --  30.6*  --  28.6* 31.0*  PLT  --  153  --  171 190  APTT  --   --   --  99*  --   LABPROT  --  17.6*  --  20.4* 25.9*  INR  --  1.43  --  1.72 2.33  HEPARINUNFRC  --   --   --  <0.10*  --   CREATININE  --  1.05  --  1.13 1.10  TROPONINI 0.06* 0.05* 0.05*  --   --    Estimated Creatinine Clearance: 79.9 mL/min (by C-G formula based on SCr of 1.1 mg/dL).  Medical History: Past Medical History:  Diagnosis Date  . Anemia   . Anxiety   . Aortic stenosis    a. Status post [employer] mechanical AVR 5/13.  . Arthritis   . Chronic back pain    Lumbar spondylosis  . Coronary atherosclerosis of native coronary artery    a. Mild, nonobstructive dz by cath 07/2011.  . Fibromyalgia   . GERD (gastroesophageal reflux disease)   . Hx of colonic polyps   . Hyperlipidemia   . Hypotension   . Long-term memory loss   . Major depressive disorder    Prior suicidal attempts  . Nocturia   . Orthostasis   . PAF (paroxysmal atrial fibrillation) ([place])   . Pain management   . Peripheral vascular disease ([place])   . Seizures ([place])    6 yrs ago from withdrawal of Xanax to quickly  . Stroke ([place])   . Thoracic aortic aneurysm ([place])    a. 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  [name] Tinnitus    Following prior seizures   Medications:  Prescriptions Prior to Admission  Medication Sig Dispense  Refill Last Dose  . ALPRAZolam (XANAX) 1 MG tablet Take 1 mg by mouth 4 (four) times daily as needed for anxiety.    04/27/2016 at Unknown time  . atorvastatin (LIPITOR) 80 MG tablet Take 80 mg by mouth daily.   Past Week at Unknown time  . baclofen (LIORESAL) 10 MG tablet Take 10 mg by mouth 3 (three) times daily.   04/24/2016 at Unknown time  . DULoxetine (CYMBALTA) 60 MG capsule Take 60 mg by mouth daily.   Past Week at Unknown time  . folic acid (FOLVITE) 1 MG tablet Take 1 mg by mouth daily.   Past Week at Unknown time  . furosemide (LASIX) 20 MG tablet Take 20 mg by mouth 2 (two) times daily.   Past Month at Unknown time  . gabapentin (NEURONTIN) 300 MG capsule Take 600 mg by mouth 3 (three) times daily.   Past Week at Unknown time  . levothyroxine (SYNTHROID, LEVOTHROID) 100 MCG tablet Take 100 mcg by mouth daily before breakfast.   Past Week at Unknown  time  . metoprolol tartrate (LOPRESSOR) 25 MG tablet TAKE ONE TABLET BY MOUTH THREE TIMES DAILY (MORNING, NOON, & IN THE EVENING) 90 tablet 6 Past Week at Unknown time  . morphine (MSIR) 15 MG tablet Take 15 mg by mouth every 6 (six) hours.   Past Month at Unknown time  . NITROSTAT 0.4 MG SL tablet Place 0.4 mg under the tongue every 5 (five) minutes as needed for chest pain.    UNKNOWN  . pantoprazole (PROTONIX) 40 MG tablet Take 40 mg by mouth daily.     Past Week at Unknown time  . potassium chloride (K-DUR) 10 MEQ tablet Take 1 tablet (10 mEq total) by mouth daily. 90 tablet 3 Past Week at Unknown time  . promethazine (PHENERGAN) 25 MG tablet Take 25 mg by mouth every 6 (six) hours as needed for nausea or vomiting.    Past Week at Unknown time  . traZODone (DESYREL) 50 MG tablet Take 50 mg by mouth at bedtime as needed for sleep.   Past Week at Unknown time  . warfarin (COUMADIN) 5 MG tablet Take 2.5-5 mg by mouth See admin instructions. Alternate taking 2.5mg  with 5mg  every evening   Past Week at Unknown time  . Artificial Saliva (BIOTENE  MOISTURIZING MOUTH) SOLN Use as directed 1-2 sprays in the mouth or throat 3 (three) times daily.   04/24/2016 at Unknown time  . lisinopril (PRINIVIL,ZESTRIL) 2.5 MG tablet Take 1 tablet (2.5 mg total) by mouth daily. 90 tablet 3 04/24/2016 at Unknown time   Assessment: 66yo male on chronic Coumadin for h/o mechanical valve.  INR SUBtherapeutic on admission.   Pharmacist has previously D/W Dr. Candiss Norse and Dr. Bronson Ing realizing it is not the optimal therapy with lovenox, but with patient refusing heparin therapy otherwise this affords an alternative without any intervention.  INR is trending up, approaching goal level.  Goal of Therapy:  INR 2.5 - 3.5 Monitor platelets by anticoagulation protocol: Yes   Plan:  Coumadin 5mg  today  x 1  Lovenox 1mg /kg (100mg ) sq q12h, d/c when INR at goal INR and CBC daily  Hart Robinsons, PharmD Clinical Pharmacist Pager:  [phone] 05/01/2016   05/01/2016,8:19 AM

## 2016-05-02 LAB — CBC
HCT: 29.3 % — ABNORMAL LOW (ref 39.0–52.0)
Hemoglobin: 9.6 g/dL — ABNORMAL LOW (ref 13.0–17.0)
MCH: 30.1 pg (ref 26.0–34.0)
MCHC: 32.8 g/dL (ref 30.0–36.0)
MCV: 91.8 fL (ref 78.0–100.0)
Platelets: 223 K/uL (ref 150–400)
RBC: 3.19 MIL/uL — ABNORMAL LOW (ref 4.22–5.81)
RDW: 15.3 % (ref 11.5–15.5)
WBC: 5.3 K/uL (ref 4.0–10.5)

## 2016-05-02 LAB — PROTIME-INR
INR: 3.11
Prothrombin Time: 32.7 s — ABNORMAL HIGH (ref 11.4–15.2)

## 2016-05-02 MED ORDER — SENNOSIDES-DOCUSATE SODIUM 8.6-50 MG PO TABS
2.0000 | ORAL_TABLET | Freq: Two times a day (BID) | ORAL | 0 refills | Status: AC
Start: 2016-05-02 — End: ?

## 2016-05-02 MED ORDER — ASPIRIN 81 MG PO TBEC: 81.0000 mg | DELAYED_RELEASE_TABLET | Freq: Every day | ORAL | Status: DC

## 2016-05-02 MED ORDER — WARFARIN SODIUM 5 MG PO TABS: 5.0000 mg | ORAL_TABLET | Freq: Once | ORAL | Status: DC

## 2016-05-02 NOTE — Discharge Summary (Addendum)
Physician Discharge Summary  [name] [id number] DOB: 01/30/1951 DOA: 04/28/2016  PCP: Allen Ponto, MD Cardiologist: [name].   Admit date: 04/28/2016 Discharge date: 05/02/2016  Admitted From: Home  Disposition:  Home (refuses SNF)  Recommendations for Outpatient Follow-up:  1. Follow up with PCP and cardiologist in 1 weeks 2. Please monitor PT/INR closely and adjust warfarin as appropriate  Discharge Condition: STABLE  CODE STATUS: DNR Diet recommendation: Heart Healthy    Brief/Interim Summary: HPI: [name] is a 66 y.o. male with medical history significant of CVA, PVD, chronic pain, MDD, HLD, CAD, afib, and s/p AVR on anticoagulation presenting with "I need help, I need to get well.  My abs, I can't walk, from my hips all the way down to my feet, they hurt, and I can't walk.  My heart's hurting."  Reports chest pain for a couple of hours.  Nothing to eat/drink for 6 days, unable to get out of recliner.  Stays in the recliner all the time.  Has not considered placement.  Was able to drive a week ago.  Was in ER 3 days ago and refused admission, "I just want to go home and die."  Very depressed.  Hasn't had depression medicine in a long time now - reports that he takes Xanax for depression.  He was seen by Dr. Laverta Wright in the ER on 1/25 for "total body pain, severe fatigue, and bilateral lower extremity edema."  His troponin was 0.23 with no acute changes on EKG.  The patient refused admission; threw his urine around the room; removed his telemetry; and eventually signed out AMA.   ED Course: Per Dr. Thurnell Wright: [id number]: BNP elevated with edema on CXR (doubt pneumonia without fever or elevated WBC count); small dose of IV lasix given due to orthostasis on VS. Potassium repleted PO. Troponin trending downward from 3 days ago. Pt agrees to stay for admission today. T/C to [facility] Dr. Lorin Wright, case discussed, including: HPI, pertinent PM/SHx, VS/PE, dx testing, ED course and treatment: Agreeable  to admit, requests to write temporary orders, obtain tele bed to team APAdmits.   Brief Narrative   [name] a 66 y.o.malewith medical history significant of CVA, PVD, chronic pain, MDD, HLD, CAD, afib, and s/p AVR on anticoagulation who was admitted to the hospital with generalized abdominal pain more so in his lower back and both lower extremities. Patient saying that he is unable to walk. Note pain has been ongoing for several years.  He had come to the ER 1 week ago and had left AMA, wanted to leave AMA again yesterday and refused all Lab draws and Ct scans, this morning he is more calm after counseling and has agreed to undergo testing and stay.   Subjective:    [name] today has, No headache, No chest pain, No abdominal pain - No Nausea, No new weakness tingling or numbness, No Cough - SOB. Has generalized pain all over, chronic low back pain ongoing for several years.   Assessment  & Plan :    1.Low back pain. Bilateral lower extremity weakness. Per patient the symptoms have been present for a few years but worse over the last few weeks, per patient he was also being worked up for some spots in his liver and brain.  At this time will do CT scan T and L-spine to rule out any patholor an PET scan as well.  No acute findings found on T and L spine imaging.   2.  Nonspecific chest pain, currently resolved. Upon in trend unremarkable and in non-ACS pattern. Echo shows preserved EF of 60% without any wall motion abnormality, cardiology on board, continue aspirin, statin and beta blocker for secondary prevention along with Coumadin. gical fracture or metastasis ( he refused initially buy now agreeable), initiate PT and pain control. If stable further workup per PCP.   3. GERD. On PPI.  4. Paroxysmal atrial fibrillation with mechanical aortic valve. Cardiology following, echo stable, continue beta blocker, Coumadin and lovenox bridging (pt has refused heparin). INR was  therapeutic prior to discharge, Home health arranged to check PT/INR daily x 3 and report results to PCP and cardiologist.   5. Bilateral lower extremity DVT. Coumadin with lovenox bridging in hospital.  Lovenox discontinued now that INR is therapeutic.   6. Hypothyroidism. On Synthroid continue.  7. Hypertension. Continue combination of beta blocker and ACE inhibitor  8. Dyslipidemia. On statin.  9. Ventricular bigeminy. Monitor electrolytes. Asymptomatic, further workup per cardiology. He is symptom-free. EF more than 60%.   Family Communication  :  None  Code Status :  Full  Diet : Diet Heart Room service appropriate? Yes; Fluid consistency: Thin   Disposition Plan  :  SNF 2/1  Consults  :  Cards  Procedures  :    Lower extremity venous duplex - bilateral lower extremity DVT  TTE Mild LVH with LVEF 60-65%. Grade 2 diastolic dysfunction. Mildlythickened mitral leaflets with trivial mitral regurgitation.Mechanical prosthesis in aortic position as described above withgrossly normal function, trivial aortic regurgitation, and asmall perivalvular leak. Mild to moderate right ventriculardysfunction. Trivial tricuspid regurgitation.  CT T&L spine -   DVT Prophylaxis  :  Coumadin  Discharge Diagnoses:  Principal Problem:   Chest pain Active Problems:   Hyperlipidemia   S/P AVR   Long term current use of anticoagulant therapy   Atrial fibrillation (HCC)   Essential hypertension, benign   Hypokalemia   Anemia   Ventricular bigeminy   Back pain   Elevated troponin   NSTEMI (non-ST elevated myocardial infarction) [facility])    Discharge Instructions  Discharge Instructions    Increase activity slowly    Complete by:  As directed      Allergies as of 05/02/2016      Reactions   Bupropion    ??   Atorvastatin    ??      Medication List    TAKE these medications   ALPRAZolam 1 MG tablet Commonly known as:  XANAX Take 1 mg by mouth 4 (four)  times daily as needed for anxiety.   atorvastatin 80 MG tablet Commonly known as:  LIPITOR Take 80 mg by mouth daily.   baclofen 10 MG tablet Commonly known as:  LIORESAL Take 10 mg by mouth 3 (three) times daily.   BIOTENE MOISTURIZING MOUTH Soln Use as directed 1-2 sprays in the mouth or throat 3 (three) times daily.   DULoxetine 60 MG capsule Commonly known as:  CYMBALTA Take 60 mg by mouth daily.   folic acid 1 MG tablet Commonly known as:  FOLVITE Take 1 mg by mouth daily.   furosemide 20 MG tablet Commonly known as:  LASIX Take 20 mg by mouth 2 (two) times daily.   gabapentin 300 MG capsule Commonly known as:  NEURONTIN Take 600 mg by mouth 3 (three) times daily.   levothyroxine 100 MCG tablet Commonly known as:  SYNTHROID, LEVOTHROID Take 100 mcg by mouth daily before breakfast.   lisinopril 2.5 MG tablet  Commonly known as:  PRINIVIL,ZESTRIL Take 1 tablet (2.5 mg total) by mouth daily.   metoprolol tartrate 25 MG tablet Commonly known as:  LOPRESSOR TAKE ONE TABLET BY MOUTH THREE TIMES DAILY (MORNING, NOON, & IN THE EVENING)   morphine 15 MG tablet Commonly known as:  MSIR Take 15 mg by mouth every 6 (six) hours.   NITROSTAT 0.4 MG SL tablet Generic drug:  nitroGLYCERIN Place 0.4 mg under the tongue every 5 (five) minutes as needed for chest pain.   pantoprazole 40 MG tablet Commonly known as:  PROTONIX Take 40 mg by mouth daily.   potassium chloride 10 MEQ tablet Commonly known as:  K-DUR Take 1 tablet (10 mEq total) by mouth daily.   promethazine 25 MG tablet Commonly known as:  PHENERGAN Take 25 mg by mouth every 6 (six) hours as needed for nausea or vomiting.   senna-docusate 8.6-50 MG tablet Commonly known as:  Senokot-S Take 2 tablets by mouth 2 (two) times daily.   traZODone 50 MG tablet Commonly known as:  DESYREL Take 50 mg by mouth at bedtime as needed for sleep.   warfarin 5 MG tablet Commonly known as:  COUMADIN Take 2.5-5 mg  by mouth See admin instructions. Alternate taking 2.5mg  with 5mg  every evening      Follow-up Information    Allen Ponto, MD. Schedule an appointment as soon as possible for a visit in 1 week(s).   Specialty:  Family Medicine Contact information: [place] [address] [phone]        [name], MD. Schedule an appointment as soon as possible for a visit in 1 week(s).   Specialty:  Cardiology Why:  Hospital Follow Up  Contact information: [address] [place] [address] [phone]          Allergies  Allergen Reactions  . Bupropion     ??  . Atorvastatin     ??   Procedures/Studies: Dg Chest 2 View  Result Date: 04/25/2016 CLINICAL DATA:  Productive cough, chest pain. EXAM: CHEST  2 VIEW COMPARISON:  Radiograph of February 24, 2016. FINDINGS: Stable cardiomegaly. Status post aortic valve repair. No pneumothorax or pleural effusion is noted. Stable mild central pulmonary vascular congestion is noted. Bony thorax is unremarkable. IMPRESSION: Stable cardiomegaly and mild central pulmonary vascular congestion. Electronically Signed   By: Marijo Conception, M.D.   On: 04/25/2016 13:22   Ct Thoracic Spine Wo Contrast  Result Date: 04/30/2016 CLINICAL DATA:  Chronic low back and bilateral lower extremity pain, worse recently. Unable to walk in the past 2-3 weeks. EXAM: CT THORACIC AND LUMBAR SPINE WITHOUT CONTRAST TECHNIQUE: Multidetector CT imaging of the thoracic and lumbar spine was performed without contrast. Multiplanar CT image reconstructions were also generated. COMPARISON:  CT abdomen and pelvis 02/24/2016. Chest CTA 05/04/2013. FINDINGS: CT THORACIC SPINE FINDINGS Alignment: Slight right convex curvature of the upper thoracic spine. No listhesis. Vertebrae: No fracture or destructive osseous lesion identified. Paraspinal and other soft tissues: Aortic and three-vessel coronary artery atherosclerosis. Prior aortic valve replacement. Trace right and small  left pleural effusions. Prominent confluent pulmonary opacity in the right upper lobe. Milder, patchy opacities partially visualized elsewhere bilaterally, with assessment limited by motion artifact. Disc levels: Mild thoracic spondylosis at evidenced by mild endplate osteophyte formation predominantly in the lower thoracic spine, greatest at T10-11. Very mild lower thoracic facet arthrosis is also greatest at T10-11. No evidence of osseous spinal or neural foraminal stenosis. CT LUMBAR SPINE  FINDINGS Segmentation: Transitional lumbosacral anatomy with partial lumbarization of S1. Rudimentary S1-2 disc. Hypoplastic ribs bilaterally at L1. Alignment: Normal. Vertebrae: No evidence of fracture or destructive osseous lesion. 1.6 cm sclerotic lesion with mild surrounding lucency in the posterior left ilium is unchanged from the prior abdominal CT and indeterminate. Paraspinal and other soft tissues: Retroperitoneal lymphadenopathy has progressed from the prior abdominal CT, with the largest lymph node measuring 3.3 cm in short axis in the left para-aortic region inferior to the renal artery. There is mild nonspecific retroperitoneal fat stranding which extends into the presacral space, incompletely visualized. A 2.4 cm portacaval lymph node is similar to the prior CT. Aortic atherosclerosis is noted. Disc levels: L1-2:  Negative. L2-3:  Negative. L3-4: Minimal disc bulging and mild right facet spurring without stenosis. L4-5: Mild disc bulging and mild bilateral facet arthrosis without stenosis. L5-S1: Mild disc bulging and severe right and mild left facet arthrosis result in mild right greater than left neural foraminal stenosis and borderline to mild bilateral lateral recess stenosis without significant overall spinal stenosis. IMPRESSION: 1. No evidence of acute osseous abnormality in the thoracic or lumbar spine. 2. Very mild thoracic spondylosis without evidence of significant stenosis. 3. Transitional lumbosacral  anatomy. 4. Mild lumbar spondylosis. Advanced right facet arthrosis at L5-S1 with mild neural foraminal stenosis. 5. Confluent right upper lobe pulmonary opacity concerning for pneumonia. Small pleural effusions. 6. Progressive retroperitoneal lymphadenopathy. 7. Aortic atherosclerosis. Electronically Signed   By: Logan Bores M.D.   On: 04/30/2016 11:41   Ct Lumbar Spine Wo Contrast  Result Date: 04/30/2016 CLINICAL DATA:  Chronic low back and bilateral lower extremity pain, worse recently. Unable to walk in the past 2-3 weeks. EXAM: CT THORACIC AND LUMBAR SPINE WITHOUT CONTRAST TECHNIQUE: Multidetector CT imaging of the thoracic and lumbar spine was performed without contrast. Multiplanar CT image reconstructions were also generated. COMPARISON:  CT abdomen and pelvis 02/24/2016. Chest CTA 05/04/2013. FINDINGS: CT THORACIC SPINE FINDINGS Alignment: Slight right convex curvature of the upper thoracic spine. No listhesis. Vertebrae: No fracture or destructive osseous lesion identified. Paraspinal and other soft tissues: Aortic and three-vessel coronary artery atherosclerosis. Prior aortic valve replacement. Trace right and small left pleural effusions. Prominent confluent pulmonary opacity in the right upper lobe. Milder, patchy opacities partially visualized elsewhere bilaterally, with assessment limited by motion artifact. Disc levels: Mild thoracic spondylosis at evidenced by mild endplate osteophyte formation predominantly in the lower thoracic spine, greatest at T10-11. Very mild lower thoracic facet arthrosis is also greatest at T10-11. No evidence of osseous spinal or neural foraminal stenosis. CT LUMBAR SPINE FINDINGS Segmentation: Transitional lumbosacral anatomy with partial lumbarization of S1. Rudimentary S1-2 disc. Hypoplastic ribs bilaterally at L1. Alignment: Normal. Vertebrae: No evidence of fracture or destructive osseous lesion. 1.6 cm sclerotic lesion with mild surrounding lucency in the  posterior left ilium is unchanged from the prior abdominal CT and indeterminate. Paraspinal and other soft tissues: Retroperitoneal lymphadenopathy has progressed from the prior abdominal CT, with the largest lymph node measuring 3.3 cm in short axis in the left para-aortic region inferior to the renal artery. There is mild nonspecific retroperitoneal fat stranding which extends into the presacral space, incompletely visualized. A 2.4 cm portacaval lymph node is similar to the prior CT. Aortic atherosclerosis is noted. Disc levels: L1-2:  Negative. L2-3:  Negative. L3-4: Minimal disc bulging and mild right facet spurring without stenosis. L4-5: Mild disc bulging and mild bilateral facet arthrosis without stenosis. L5-S1: Mild disc bulging and severe right and  mild left facet arthrosis result in mild right greater than left neural foraminal stenosis and borderline to mild bilateral lateral recess stenosis without significant overall spinal stenosis. IMPRESSION: 1. No evidence of acute osseous abnormality in the thoracic or lumbar spine. 2. Very mild thoracic spondylosis without evidence of significant stenosis. 3. Transitional lumbosacral anatomy. 4. Mild lumbar spondylosis. Advanced right facet arthrosis at L5-S1 with mild neural foraminal stenosis. 5. Confluent right upper lobe pulmonary opacity concerning for pneumonia. Small pleural effusions. 6. Progressive retroperitoneal lymphadenopathy. 7. Aortic atherosclerosis. Electronically Signed   By: Logan Bores M.D.   On: 04/30/2016 11:41   US Venous Img Lower Bilateral  Result Date: 04/29/2016 CLINICAL DATA:  Chronic bilateral pain, edema. Previous tobacco abuse. On Coumadin. EXAM: BILATERAL LOWER EXTREMITY VENOUS DOPPLER ULTRASOUND TECHNIQUE: Cassata-scale sonography with compression, as well as color and duplex ultrasound, were performed to evaluate the deep venous system from the level of the common femoral vein through the popliteal and proximal calf veins.  COMPARISON:  None FINDINGS: There is hypoechoic thrombus in the mid right femoral vein resulting in incompressibility. Some continued flow across this level on color Doppler. Normal compressibility of the common femoral and popliteal veins, as well as the proximal calf veins. Visualized segments of the saphenous venous system normal in caliber and compressibility. On the left, occlusive thrombus in the popliteal vein. Femoral vein is noncompressible in its mid and distal segments, with some residual flow suggested on color Doppler. IMPRESSION: 1. Partially occlusive left femoral-popliteal DVT. 2. Isolated right mid femoral vein partially occlusive DVT. Electronically Signed   By: Lucrezia Europe M.D.   On: 04/29/2016 11:57   Dg Chest Port 1 View  Result Date: 04/28/2016 CLINICAL DATA:  Shortness of breath, weakness and bilateral lower extremity edema. EXAM: PORTABLE CHEST 1 VIEW COMPARISON:  04/25/2016 and prior chest radiographs FINDINGS: Cardiomegaly and aortic valve replacement changes noted. New mild interstitial opacities are suggestive of interstitial edema. Right upper lobe airspace disease may represent focal edema or pneumonia. Trace bilateral pleural effusions are noted. There is no evidence of pneumothorax or acute bony abnormality. IMPRESSION: Cardiomegaly with new mild interstitial opacities -question mild interstitial edema. Right upper lobe airspace disease-question focal edema versus pneumonia. Electronically Signed   By: Margarette Canada M.D.   On: 04/28/2016 17:35    Subjective: Pt reports that he feels better.  Still refusing SNF but agreed to receive [facility] services.   Discharge Exam: Vitals:   05/01/16 2020 05/02/16 0547  BP: (!) 112/55 (!) 108/42  Pulse: 70 65  Resp: 16 18  Temp: 98.5 F (36.9 C) 98.2 F (36.8 C)   Vitals:   05/01/16 0430 05/01/16 1438 05/01/16 2020 05/02/16 0547  BP: (!) 129/56 (!) 101/53 (!) 112/55 (!) 108/42  Pulse: 86 68 70 65  Resp: 18 18 16 18   Temp: 98.7 F  (37.1 C) 98.7 F (37.1 C) 98.5 F (36.9 C) 98.2 F (36.8 C)  TempSrc: Oral  Oral Oral  SpO2: 92% 93% 93% 93%  Weight: 95.2 kg (209 lb 14.1 oz)   92.5 kg (204 lb)  Height:       General: Pt is alert, awake, not in acute distress Cardiovascular:  S1/S2 +, no rubs, no gallops Respiratory: CTA bilaterally, no wheezing, no rhonchi Abdominal: Soft, NT, ND, bowel sounds + Extremities: no cyanosis  The results of significant diagnostics from this hospitalization (including imaging, microbiology, ancillary and laboratory) are listed below for reference.     Microbiology: Recent Results (from the  past 240 hour(s))  Culture, blood (routine x 2)     Status: None (Preliminary result)   Collection Time: 04/29/16 12:04 PM  Result Value Ref Range Status   Specimen Description BLOOD LEFT HAND  Final   Special Requests BOTTLES DRAWN AEROBIC AND ANAEROBIC 6CC  Final   Culture NO GROWTH 3 DAYS  Final   Report Status PENDING  Incomplete  Culture, blood (routine x 2)     Status: None (Preliminary result)   Collection Time: 04/29/16 12:04 PM  Result Value Ref Range Status   Specimen Description BLOOD LEFT FOREARM  Final   Special Requests BOTTLES DRAWN AEROBIC AND ANAEROBIC 6CC  Final   Culture NO GROWTH 3 DAYS  Final   Report Status PENDING  Incomplete     Labs: BNP (last 3 results)  Recent Labs  08/12/15 1356 04/25/16 1324 04/28/16 1658  BNP 214.0* 214.0* [id number]*   Basic Metabolic Panel:  Recent Labs Lab 04/25/16 1310 04/28/16 1654 04/29/16 0344 04/30/16 0414 05/01/16 0431  NA 138 137 136 137 136  K 3.3* 3.2* 3.4* 3.4* 3.6  CL 103 106 107 107 105  CO2 25 22 21* 23 24  GLUCOSE 84 77 78 78 104*  BUN 10 15 15 19 13   CREATININE 1.24 1.04 1.05 1.13 1.10  CALCIUM 8.6* 8.0* 7.5* 7.7* 7.8*  MG  --  2.3  --   --   --    Liver Function Tests:  Recent Labs Lab 04/25/16 1310  AST 26  ALT 11*  ALKPHOS 82  BILITOT 1.2  PROT 8.1  ALBUMIN 3.5   No results for input(s): LIPASE,  AMYLASE in the last 168 hours. No results for input(s): AMMONIA in the last 168 hours. CBC:  Recent Labs Lab 04/25/16 1310 04/28/16 1654 04/29/16 0344 04/30/16 0414 05/01/16 0431 05/02/16 0554  WBC 8.3 7.0 6.3 6.2 4.7 5.3  NEUTROABS 6.7 5.5  --   --   --   --   HGB 11.9* 10.9* 10.0* 9.1* 9.6* 9.6*  HCT 36.7* 33.8* 30.6* 28.6* 31.0* 29.3*  MCV 94.3 92.9 92.2 93.5 94.5 91.8  PLT 106* 150 153 171 190 223   Cardiac Enzymes:  Recent Labs Lab 04/25/16 1630 04/28/16 1654 04/28/16 2122 04/29/16 0344 04/29/16 1204  TROPONINI 0.20* 0.06* 0.06* 0.05* 0.05*   BNP: Invalid input(s): POCBNP CBG: No results for input(s): GLUCAP in the last 168 hours. D-Dimer No results for input(s): DDIMER in the last 72 hours. Hgb A1c No results for input(s): HGBA1C in the last 72 hours. Lipid Profile No results for input(s): CHOL, HDL, LDLCALC, TRIG, CHOLHDL, LDLDIRECT in the last 72 hours. Thyroid function studies No results for input(s): TSH, T4TOTAL, T3FREE, THYROIDAB in the last 72 hours.  Invalid input(s): FREET3 Anemia work up No results for input(s): VITAMINB12, FOLATE, FERRITIN, TIBC, IRON, RETICCTPCT in the last 72 hours. Urinalysis    Component Value Date/Time   COLORURINE YELLOW 04/28/2016 1856   APPEARANCEUR CLEAR 04/28/2016 1856   LABSPEC 1.016 04/28/2016 1856   PHURINE 6.0 04/28/2016 1856   GLUCOSEU NEGATIVE 04/28/2016 1856   HGBUR SMALL (A) 04/28/2016 1856   BILIRUBINUR NEGATIVE 04/28/2016 1856   KETONESUR 20 (A) 04/28/2016 1856   PROTEINUR 30 (A) 04/28/2016 1856   UROBILINOGEN 0.2 08/13/2011 1607   NITRITE NEGATIVE 04/28/2016 1856   LEUKOCYTESUR NEGATIVE 04/28/2016 1856   Sepsis Labs Invalid input(s): PROCALCITONIN,  WBC,  LACTICIDVEN Microbiology Recent Results (from the past 240 hour(s))  Culture, blood (routine x 2)  Status: None (Preliminary result)   Collection Time: 04/29/16 12:04 PM  Result Value Ref Range Status   Specimen Description BLOOD LEFT HAND   Final   Special Requests BOTTLES DRAWN AEROBIC AND ANAEROBIC 6CC  Final   Culture NO GROWTH 3 DAYS  Final   Report Status PENDING  Incomplete  Culture, blood (routine x 2)     Status: None (Preliminary result)   Collection Time: 04/29/16 12:04 PM  Result Value Ref Range Status   Specimen Description BLOOD LEFT FOREARM  Final   Special Requests BOTTLES DRAWN AEROBIC AND ANAEROBIC 6CC  Final   Culture NO GROWTH 3 DAYS  Final   Report Status PENDING  Incomplete   Time coordinating discharge: 31 minutes  SIGNED:  Irwin Brakeman, MD  [facility] 05/02/2016, 11:45 AM Pager   If 7PM-7AM, please contact night-coverage [email or web address] Password [email or web address]

## 2016-05-02 NOTE — Progress Notes (Signed)
Pt has been mildly aggressive throughout night shift and is constantly asking when he can leave. Pt states he is "leaving today no matter what the doctor says." Pt has been educated on importance of following doctors orders. Will continue to monitor.

## 2016-05-02 NOTE — Progress Notes (Signed)
ANTICOAGULATION CONSULT NOTE - I  Pharmacy Consult for Coumadin (home med), lovenox until INR at goal Indication: mechanical valve  Allergies  Allergen Reactions  . Bupropion     ??  . Atorvastatin     ??   Patient Measurements: Height: 5\' 11"  (180.3 cm) Weight: 204 lb (92.5 kg) IBW/kg (Calculated) : 75.3  Vital Signs: Temp: 98.2 F (36.8 C) (02/01 0547) Temp Source: Oral (02/01 0547) BP: 108/42 (02/01 0547) Pulse Rate: 65 (02/01 0547)  Labs:  Recent Labs  04/29/16 1204  04/30/16 0414 05/01/16 0431 05/02/16 0554  HGB  --   < > 9.1* 9.6* 9.6*  HCT  --   --  28.6* 31.0* 29.3*  PLT  --   --  171 190 223  APTT  --   --  99*  --   --   LABPROT  --   --  20.4* 25.9* 32.7*  INR  --   --  1.72 2.33 3.11  HEPARINUNFRC  --   --  <0.10*  --   --   CREATININE  --   --  1.13 1.10  --   TROPONINI 0.05*  --   --   --   --   < > = values in this interval not displayed. Estimated Creatinine Clearance: 78.9 mL/min (by C-G formula based on SCr of 1.1 mg/dL).  Medical History: Past Medical History:  Diagnosis Date  . Anemia   . Anxiety   . Aortic stenosis    a. Status post St. Jude mechanical AVR 5/13.  . Arthritis   . Chronic back pain    Lumbar spondylosis  . Coronary atherosclerosis of native coronary artery    a. Mild, nonobstructive dz by cath 07/2011.  . Fibromyalgia   . GERD (gastroesophageal reflux disease)   . Hx of colonic polyps   . Hyperlipidemia   . Hypotension   . Long-term memory loss   . Major depressive disorder    Prior suicidal attempts  . Nocturia   . Orthostasis   . PAF (paroxysmal atrial fibrillation) ([place])   . Pain management   . Peripheral vascular disease ([place])   . Seizures ([place])    6 yrs ago from withdrawal of Xanax to quickly  . Stroke ([place])   . 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  [name] Tinnitus    Following prior seizures   Medications:  Prescriptions Prior to Admission   Medication Sig Dispense Refill Last Dose  . ALPRAZolam (XANAX) 1 MG tablet Take 1 mg by mouth 4 (four) times daily as needed for anxiety.    04/27/2016 at Unknown time  . atorvastatin (LIPITOR) 80 MG tablet Take 80 mg by mouth daily.   Past Week at Unknown time  . baclofen (LIORESAL) 10 MG tablet Take 10 mg by mouth 3 (three) times daily.   04/24/2016 at Unknown time  . DULoxetine (CYMBALTA) 60 MG capsule Take 60 mg by mouth daily.   Past Week at Unknown time  . folic acid (FOLVITE) 1 MG tablet Take 1 mg by mouth daily.   Past Week at Unknown time  . furosemide (LASIX) 20 MG tablet Take 20 mg by mouth 2 (two) times daily.   Past Month at Unknown time  . gabapentin (NEURONTIN) 300 MG capsule Take 600 mg by mouth 3 (three) times daily.   Past Week at Unknown time  . levothyroxine (SYNTHROID, LEVOTHROID) 100 MCG tablet Take 100 mcg  by mouth daily before breakfast.   Past Week at Unknown time  . metoprolol tartrate (LOPRESSOR) 25 MG tablet TAKE ONE TABLET BY MOUTH THREE TIMES DAILY (MORNING, NOON, & IN THE EVENING) 90 tablet 6 Past Week at Unknown time  . morphine (MSIR) 15 MG tablet Take 15 mg by mouth every 6 (six) hours.   Past Month at Unknown time  . NITROSTAT 0.4 MG SL tablet Place 0.4 mg under the tongue every 5 (five) minutes as needed for chest pain.    UNKNOWN  . pantoprazole (PROTONIX) 40 MG tablet Take 40 mg by mouth daily.     Past Week at Unknown time  . potassium chloride (K-DUR) 10 MEQ tablet Take 1 tablet (10 mEq total) by mouth daily. 90 tablet 3 Past Week at Unknown time  . promethazine (PHENERGAN) 25 MG tablet Take 25 mg by mouth every 6 (six) hours as needed for nausea or vomiting.    Past Week at Unknown time  . traZODone (DESYREL) 50 MG tablet Take 50 mg by mouth at bedtime as needed for sleep.   Past Week at Unknown time  . warfarin (COUMADIN) 5 MG tablet Take 2.5-5 mg by mouth See admin instructions. Alternate taking 2.5mg  with 5mg  every evening   Past Week at Unknown time  .  Artificial Saliva (BIOTENE MOISTURIZING MOUTH) SOLN Use as directed 1-2 sprays in the mouth or throat 3 (three) times daily.   04/24/2016 at Unknown time  . lisinopril (PRINIVIL,ZESTRIL) 2.5 MG tablet Take 1 tablet (2.5 mg total) by mouth daily. 90 tablet 3 04/24/2016 at Unknown time   Assessment: 66yo male on chronic Coumadin for h/o mechanical valve.  INR SUBtherapeutic on admission.   Pharmacist has previously D/W Dr. Candiss Norse and Dr. Bronson Ing realizing it is not the optimal therapy with lovenox, but with patient refusing heparin therapy otherwise this affords an alternative without any intervention.  INR is therapeutic this AM.  Goal of Therapy:  INR 2.5 - 3.5 Monitor platelets by anticoagulation protocol: Yes   Plan:  Coumadin 5mg  today  x 1  D/C lovenox PT/INR daily  Isac Sarna, BS [place], [place] Clinical Pharmacist Pager [phone]  05/02/2016   05/02/2016,9:49 AM

## 2016-05-02 NOTE — Progress Notes (Signed)
[name] discharged Home per MD order.  Discharge instructions reviewed and discussed with the patient, all questions and concerns answered. Copy of instructions and scripts given to patient.  Allergies as of 05/02/2016      Reactions   Bupropion    ??   Atorvastatin    ??      Medication List    TAKE these medications   ALPRAZolam 1 MG tablet Commonly known as:  XANAX Take 1 mg by mouth 4 (four) times daily as needed for anxiety.   atorvastatin 80 MG tablet Commonly known as:  LIPITOR Take 80 mg by mouth daily.   baclofen 10 MG tablet Commonly known as:  LIORESAL Take 10 mg by mouth 3 (three) times daily.   BIOTENE MOISTURIZING MOUTH Soln Use as directed 1-2 sprays in the mouth or throat 3 (three) times daily.   DULoxetine 60 MG capsule Commonly known as:  CYMBALTA Take 60 mg by mouth daily.   folic acid 1 MG tablet Commonly known as:  FOLVITE Take 1 mg by mouth daily.   furosemide 20 MG tablet Commonly known as:  LASIX Take 20 mg by mouth 2 (two) times daily.   gabapentin 300 MG capsule Commonly known as:  NEURONTIN Take 600 mg by mouth 3 (three) times daily.   levothyroxine 100 MCG tablet Commonly known as:  SYNTHROID, LEVOTHROID Take 100 mcg by mouth daily before breakfast.   lisinopril 2.5 MG tablet Commonly known as:  PRINIVIL,ZESTRIL Take 1 tablet (2.5 mg total) by mouth daily.   metoprolol tartrate 25 MG tablet Commonly known as:  LOPRESSOR TAKE ONE TABLET BY MOUTH THREE TIMES DAILY (MORNING, NOON, & IN THE EVENING)   morphine 15 MG tablet Commonly known as:  MSIR Take 15 mg by mouth every 6 (six) hours.   NITROSTAT 0.4 MG SL tablet Generic drug:  nitroGLYCERIN Place 0.4 mg under the tongue every 5 (five) minutes as needed for chest pain.   pantoprazole 40 MG tablet Commonly known as:  PROTONIX Take 40 mg by mouth daily.   potassium chloride 10 MEQ tablet Commonly known as:  K-DUR Take 1 tablet (10 mEq total) by mouth daily.    promethazine 25 MG tablet Commonly known as:  PHENERGAN Take 25 mg by mouth every 6 (six) hours as needed for nausea or vomiting.   senna-docusate 8.6-50 MG tablet Commonly known as:  Senokot-S Take 2 tablets by mouth 2 (two) times daily.   traZODone 50 MG tablet Commonly known as:  DESYREL Take 50 mg by mouth at bedtime as needed for sleep.   warfarin 5 MG tablet Commonly known as:  COUMADIN Take 2.5-5 mg by mouth See admin instructions. Alternate taking 2.5mg  with 5mg  every evening       Patients skin is clean, dry and intact, no evidence of skin break down. IV site discontinued and catheter remains intact. Site without signs and symptoms of complications. Dressing and pressure applied.  Patient escorted to car by NT in a wheelchair,  no distress noted upon discharge.  [name] 05/02/2016 12:01 PM

## 2016-05-02 NOTE — Progress Notes (Signed)
Physical Therapy Treatment Patient Details Name: [name] MRN: [id number] DOB: [birth year] Today's Date: 05/02/2016    History of Present Illness 53M with complex PMH including ?recent undetermined lesions (?2 spots on brain/lung per patient; liver lesion per PET order), previous paroxysmal atrial fib (CHADSVASC 3), AS s/p mechanical St. 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 US done today with  Partially occlusive left femoral-popliteal DVT, and  Isolated right mid femoral vein partially occlusive DVT - Coumadin with heparin bridging for now..    PT Comments    Able to convince participation with therapy as patient initially refused due to back discomfort.  Pt cooperative and able to follow all commands.  Pt declined use of walker and requested HHA to ambulate to recliner.  Pt declined to ambulate further than the recliner.  Modified independent with bed mobility and supervision with sit to stand.  Spoke to nursing due to no chair alarms present and assistant was in the hall supervising.   Follow Up Recommendations        Equipment Recommendations       Recommendations for Other Services       Precautions / Restrictions Precautions Precautions: Fall Restrictions Weight Bearing Restrictions: No    Mobility  Bed Mobility Overal bed mobility: Modified Independent Bed Mobility: Supine to Sit     Supine to sit: Modified independent (Device/Increase time)        Transfers                    Ambulation/Gait Ambulation/Gait assistance: Min guard Ambulation Distance (Feet): 5  Feet Assistive device: 1 person hand held assist           Stairs            Wheelchair Mobility    Modified Rankin (Stroke Patients Only)       Balance                                    Cognition Arousal/Alertness: Awake/alert Behavior During Therapy: WFL for tasks assessed/performed Overall Cognitive Status: Within Functional Limits for tasks assessed                      Exercises      General Comments        Pertinent Vitals/Pain Pain Assessment: No/denies pain    Home Living                      Prior Function            PT Goals (current goals can now be found in the care plan section) Progress towards PT goals: Progressing toward goals    Frequency           PT Plan      Co-evaluation             End of Session Equipment Utilized During Treatment: Gait belt Activity Tolerance: Patient limited by fatigue Patient left: in chair;with call bell/phone within reach     Time: 0840-0900 PT Time Calculation (min) (ACUTE ONLY): 20 min  Charges:  $Therapeutic  Activity: 8-22 mins                     Teena Irani, PTA/CLT [phone]  05/02/2016, 1:43 PM

## 2016-05-02 NOTE — Clinical Social Work Note (Addendum)
Patient has made the decision to go home. He no longer desire placement. He was alert and oriented.   He stated that he wanted to be in his own environment. LCSW notified CM of patient's choice to go home.  CSW signing off.     Rickia [place]burg, Clydene Pugh, LCSW

## 2016-05-02 NOTE — Care Management Note (Signed)
Case Management Note  Patient Details  Name: [name] MRN: [id number] Date of Birth: [birth year]    Expected Discharge Date:  04/30/16               Expected Discharge Plan:  Home/Self Care  In-House Referral:  NA  Discharge planning Services  CM Consult  Post Acute Care Choice:  Home Health Choice offered to:     DME Arranged:    DME Agency:     HH Arranged:  RN, Social Work [employer] Agency:  [place]  Status of Service:  Completed, signed off  If discussed at [employer], dates discussed:    Additional Comments: Patient discharging today. He declines SNF, agreeable to [facility]. Chose [facility] from list of agencies. [place] [facility] notified and will obtain orders from chart. Will order RN and SW. Patient has cane and walker PTA. States his daughter and nephew are available to help him if needed.  Farrie Sann, Chauncey [place], RN 05/02/2016, 10:00 AM

## 2016-05-03 DIAGNOSIS — I10 Essential (primary) hypertension: Secondary | ICD-10-CM | POA: Diagnosis not present

## 2016-05-03 DIAGNOSIS — I82403 Acute embolism and thrombosis of unspecified deep veins of lower extremity, bilateral: Secondary | ICD-10-CM | POA: Diagnosis not present

## 2016-05-03 DIAGNOSIS — E039 Hypothyroidism, unspecified: Secondary | ICD-10-CM | POA: Diagnosis not present

## 2016-05-03 DIAGNOSIS — I2510 Atherosclerotic heart disease of native coronary artery without angina pectoris: Secondary | ICD-10-CM | POA: Diagnosis not present

## 2016-05-03 DIAGNOSIS — G8929 Other chronic pain: Secondary | ICD-10-CM | POA: Diagnosis not present

## 2016-05-03 DIAGNOSIS — I739 Peripheral vascular disease, unspecified: Secondary | ICD-10-CM | POA: Diagnosis not present

## 2016-05-03 DIAGNOSIS — I480 Paroxysmal atrial fibrillation: Secondary | ICD-10-CM | POA: Diagnosis not present

## 2016-05-03 DIAGNOSIS — E785 Hyperlipidemia, unspecified: Secondary | ICD-10-CM | POA: Diagnosis not present

## 2016-05-03 DIAGNOSIS — I252 Old myocardial infarction: Secondary | ICD-10-CM | POA: Diagnosis not present

## 2016-05-04 DIAGNOSIS — E039 Hypothyroidism, unspecified: Secondary | ICD-10-CM | POA: Diagnosis not present

## 2016-05-04 DIAGNOSIS — I739 Peripheral vascular disease, unspecified: Secondary | ICD-10-CM | POA: Diagnosis not present

## 2016-05-04 DIAGNOSIS — I2510 Atherosclerotic heart disease of native coronary artery without angina pectoris: Secondary | ICD-10-CM | POA: Diagnosis not present

## 2016-05-04 DIAGNOSIS — G8929 Other chronic pain: Secondary | ICD-10-CM | POA: Diagnosis not present

## 2016-05-04 DIAGNOSIS — I82403 Acute embolism and thrombosis of unspecified deep veins of lower extremity, bilateral: Secondary | ICD-10-CM | POA: Diagnosis not present

## 2016-05-04 DIAGNOSIS — E785 Hyperlipidemia, unspecified: Secondary | ICD-10-CM | POA: Diagnosis not present

## 2016-05-04 DIAGNOSIS — I10 Essential (primary) hypertension: Secondary | ICD-10-CM | POA: Diagnosis not present

## 2016-05-04 DIAGNOSIS — I480 Paroxysmal atrial fibrillation: Secondary | ICD-10-CM | POA: Diagnosis not present

## 2016-05-04 DIAGNOSIS — I252 Old myocardial infarction: Secondary | ICD-10-CM | POA: Diagnosis not present

## 2016-05-04 LAB — CULTURE, BLOOD (ROUTINE X 2)
CULTURE: NO GROWTH
Culture: NO GROWTH

## 2016-05-05 DIAGNOSIS — E039 Hypothyroidism, unspecified: Secondary | ICD-10-CM | POA: Diagnosis not present

## 2016-05-05 DIAGNOSIS — E785 Hyperlipidemia, unspecified: Secondary | ICD-10-CM | POA: Diagnosis not present

## 2016-05-05 DIAGNOSIS — I480 Paroxysmal atrial fibrillation: Secondary | ICD-10-CM | POA: Diagnosis not present

## 2016-05-05 DIAGNOSIS — G8929 Other chronic pain: Secondary | ICD-10-CM | POA: Diagnosis not present

## 2016-05-05 DIAGNOSIS — I252 Old myocardial infarction: Secondary | ICD-10-CM | POA: Diagnosis not present

## 2016-05-05 DIAGNOSIS — I82403 Acute embolism and thrombosis of unspecified deep veins of lower extremity, bilateral: Secondary | ICD-10-CM | POA: Diagnosis not present

## 2016-05-05 DIAGNOSIS — I2510 Atherosclerotic heart disease of native coronary artery without angina pectoris: Secondary | ICD-10-CM | POA: Diagnosis not present

## 2016-05-05 DIAGNOSIS — I10 Essential (primary) hypertension: Secondary | ICD-10-CM | POA: Diagnosis not present

## 2016-05-05 DIAGNOSIS — I739 Peripheral vascular disease, unspecified: Secondary | ICD-10-CM | POA: Diagnosis not present

## 2016-05-06 DIAGNOSIS — I480 Paroxysmal atrial fibrillation: Secondary | ICD-10-CM | POA: Diagnosis not present

## 2016-05-06 DIAGNOSIS — I252 Old myocardial infarction: Secondary | ICD-10-CM | POA: Diagnosis not present

## 2016-05-06 DIAGNOSIS — G8929 Other chronic pain: Secondary | ICD-10-CM | POA: Diagnosis not present

## 2016-05-06 DIAGNOSIS — I10 Essential (primary) hypertension: Secondary | ICD-10-CM | POA: Diagnosis not present

## 2016-05-06 DIAGNOSIS — E039 Hypothyroidism, unspecified: Secondary | ICD-10-CM | POA: Diagnosis not present

## 2016-05-06 DIAGNOSIS — E785 Hyperlipidemia, unspecified: Secondary | ICD-10-CM | POA: Diagnosis not present

## 2016-05-06 DIAGNOSIS — I2510 Atherosclerotic heart disease of native coronary artery without angina pectoris: Secondary | ICD-10-CM | POA: Diagnosis not present

## 2016-05-06 DIAGNOSIS — I739 Peripheral vascular disease, unspecified: Secondary | ICD-10-CM | POA: Diagnosis not present

## 2016-05-06 DIAGNOSIS — I82403 Acute embolism and thrombosis of unspecified deep veins of lower extremity, bilateral: Secondary | ICD-10-CM | POA: Diagnosis not present

## 2016-05-07 ENCOUNTER — Encounter: Payer: Self-pay | Admitting: Licensed Clinical Social Worker

## 2016-05-07 ENCOUNTER — Other Ambulatory Visit: Payer: Self-pay | Admitting: Licensed Clinical Social Worker

## 2016-05-07 DIAGNOSIS — E785 Hyperlipidemia, unspecified: Secondary | ICD-10-CM | POA: Diagnosis not present

## 2016-05-07 DIAGNOSIS — I252 Old myocardial infarction: Secondary | ICD-10-CM | POA: Diagnosis not present

## 2016-05-07 DIAGNOSIS — I739 Peripheral vascular disease, unspecified: Secondary | ICD-10-CM | POA: Diagnosis not present

## 2016-05-07 DIAGNOSIS — G8929 Other chronic pain: Secondary | ICD-10-CM | POA: Diagnosis not present

## 2016-05-07 DIAGNOSIS — E039 Hypothyroidism, unspecified: Secondary | ICD-10-CM | POA: Diagnosis not present

## 2016-05-07 DIAGNOSIS — I2510 Atherosclerotic heart disease of native coronary artery without angina pectoris: Secondary | ICD-10-CM | POA: Diagnosis not present

## 2016-05-07 DIAGNOSIS — I82403 Acute embolism and thrombosis of unspecified deep veins of lower extremity, bilateral: Secondary | ICD-10-CM | POA: Diagnosis not present

## 2016-05-07 DIAGNOSIS — I10 Essential (primary) hypertension: Secondary | ICD-10-CM | POA: Diagnosis not present

## 2016-05-07 DIAGNOSIS — I480 Paroxysmal atrial fibrillation: Secondary | ICD-10-CM | POA: Diagnosis not present

## 2016-05-07 NOTE — Patient Outreach (Signed)
Assessment;  CSW spoke via phone with client. CSW verified client identity. CSW and client spoke of client needs. Client said he had his prescribed medications and is taking medications as prescribed. CSW informed [name] that client had now met his care plan goals with [facility] CSW services. Thus, CSW informed client on 05/07/16 that [facility] would discharge client from [facility] services on 05/07/16 since client had now met his care plan goals [facility] CSW services.  Client agreed to this plan. CSW encouraged client to continue attending scheduled client medical appointments with [name]. CSW encouraged client to attend appointmnts scheduled for client  at [facility].CSW congratulated [name] on reaching his care plan goals with [facility] CSW services. [name] was appreciative of support he had received in recent months from [name].       Plan:    CSW is discharging [name] from [facility] CSW services on 05/07/16 since client has met client care plan goals with [facility] CSW services.   CSW to inform [name] that [facility] discharged client on 05/07/16 from [facility] services.  CSW to fax physician case closure letter to [name] informing [name] that [facility] discharged client from [facility] CSW services on 05/07/16.   [name].[place] Cookson MSW, LCSW Licensed Clinical Social Worker [facility] Care Management [phone]

## 2016-05-08 DIAGNOSIS — E785 Hyperlipidemia, unspecified: Secondary | ICD-10-CM | POA: Diagnosis not present

## 2016-05-08 DIAGNOSIS — I739 Peripheral vascular disease, unspecified: Secondary | ICD-10-CM | POA: Diagnosis not present

## 2016-05-08 DIAGNOSIS — E039 Hypothyroidism, unspecified: Secondary | ICD-10-CM | POA: Diagnosis not present

## 2016-05-08 DIAGNOSIS — G8929 Other chronic pain: Secondary | ICD-10-CM | POA: Diagnosis not present

## 2016-05-08 DIAGNOSIS — I10 Essential (primary) hypertension: Secondary | ICD-10-CM | POA: Diagnosis not present

## 2016-05-08 DIAGNOSIS — I2510 Atherosclerotic heart disease of native coronary artery without angina pectoris: Secondary | ICD-10-CM | POA: Diagnosis not present

## 2016-05-08 DIAGNOSIS — I82403 Acute embolism and thrombosis of unspecified deep veins of lower extremity, bilateral: Secondary | ICD-10-CM | POA: Diagnosis not present

## 2016-05-08 DIAGNOSIS — I480 Paroxysmal atrial fibrillation: Secondary | ICD-10-CM | POA: Diagnosis not present

## 2016-05-08 DIAGNOSIS — I252 Old myocardial infarction: Secondary | ICD-10-CM | POA: Diagnosis not present

## 2016-05-10 DIAGNOSIS — I2510 Atherosclerotic heart disease of native coronary artery without angina pectoris: Secondary | ICD-10-CM | POA: Diagnosis not present

## 2016-05-10 DIAGNOSIS — I739 Peripheral vascular disease, unspecified: Secondary | ICD-10-CM | POA: Diagnosis not present

## 2016-05-10 DIAGNOSIS — I480 Paroxysmal atrial fibrillation: Secondary | ICD-10-CM | POA: Diagnosis not present

## 2016-05-10 DIAGNOSIS — I82403 Acute embolism and thrombosis of unspecified deep veins of lower extremity, bilateral: Secondary | ICD-10-CM | POA: Diagnosis not present

## 2016-05-10 DIAGNOSIS — E039 Hypothyroidism, unspecified: Secondary | ICD-10-CM | POA: Diagnosis not present

## 2016-05-10 DIAGNOSIS — I10 Essential (primary) hypertension: Secondary | ICD-10-CM | POA: Diagnosis not present

## 2016-05-10 DIAGNOSIS — I252 Old myocardial infarction: Secondary | ICD-10-CM | POA: Diagnosis not present

## 2016-05-10 DIAGNOSIS — E785 Hyperlipidemia, unspecified: Secondary | ICD-10-CM | POA: Diagnosis not present

## 2016-05-10 DIAGNOSIS — G8929 Other chronic pain: Secondary | ICD-10-CM | POA: Diagnosis not present

## 2016-05-13 DIAGNOSIS — I10 Essential (primary) hypertension: Secondary | ICD-10-CM | POA: Diagnosis not present

## 2016-05-13 DIAGNOSIS — I252 Old myocardial infarction: Secondary | ICD-10-CM | POA: Diagnosis not present

## 2016-05-13 DIAGNOSIS — G8929 Other chronic pain: Secondary | ICD-10-CM | POA: Diagnosis not present

## 2016-05-13 DIAGNOSIS — E039 Hypothyroidism, unspecified: Secondary | ICD-10-CM | POA: Diagnosis not present

## 2016-05-13 DIAGNOSIS — I82403 Acute embolism and thrombosis of unspecified deep veins of lower extremity, bilateral: Secondary | ICD-10-CM | POA: Diagnosis not present

## 2016-05-13 DIAGNOSIS — E785 Hyperlipidemia, unspecified: Secondary | ICD-10-CM | POA: Diagnosis not present

## 2016-05-13 DIAGNOSIS — I2510 Atherosclerotic heart disease of native coronary artery without angina pectoris: Secondary | ICD-10-CM | POA: Diagnosis not present

## 2016-05-13 DIAGNOSIS — I480 Paroxysmal atrial fibrillation: Secondary | ICD-10-CM | POA: Diagnosis not present

## 2016-05-13 DIAGNOSIS — I739 Peripheral vascular disease, unspecified: Secondary | ICD-10-CM | POA: Diagnosis not present

## 2016-05-15 DIAGNOSIS — I10 Essential (primary) hypertension: Secondary | ICD-10-CM | POA: Diagnosis not present

## 2016-05-15 DIAGNOSIS — I252 Old myocardial infarction: Secondary | ICD-10-CM | POA: Diagnosis not present

## 2016-05-15 DIAGNOSIS — I82403 Acute embolism and thrombosis of unspecified deep veins of lower extremity, bilateral: Secondary | ICD-10-CM | POA: Diagnosis not present

## 2016-05-15 DIAGNOSIS — I480 Paroxysmal atrial fibrillation: Secondary | ICD-10-CM | POA: Diagnosis not present

## 2016-05-15 DIAGNOSIS — I739 Peripheral vascular disease, unspecified: Secondary | ICD-10-CM | POA: Diagnosis not present

## 2016-05-15 DIAGNOSIS — E785 Hyperlipidemia, unspecified: Secondary | ICD-10-CM | POA: Diagnosis not present

## 2016-05-15 DIAGNOSIS — E039 Hypothyroidism, unspecified: Secondary | ICD-10-CM | POA: Diagnosis not present

## 2016-05-15 DIAGNOSIS — G8929 Other chronic pain: Secondary | ICD-10-CM | POA: Diagnosis not present

## 2016-05-15 DIAGNOSIS — I2510 Atherosclerotic heart disease of native coronary artery without angina pectoris: Secondary | ICD-10-CM | POA: Diagnosis not present

## 2016-05-17 DIAGNOSIS — I2699 Other pulmonary embolism without acute cor pulmonale: Secondary | ICD-10-CM | POA: Diagnosis not present

## 2016-05-17 DIAGNOSIS — R279 Unspecified lack of coordination: Secondary | ICD-10-CM | POA: Diagnosis not present

## 2016-05-17 DIAGNOSIS — M797 Fibromyalgia: Secondary | ICD-10-CM | POA: Diagnosis not present

## 2016-05-17 DIAGNOSIS — R404 Transient alteration of awareness: Secondary | ICD-10-CM | POA: Diagnosis not present

## 2016-05-17 DIAGNOSIS — M7989 Other specified soft tissue disorders: Secondary | ICD-10-CM | POA: Diagnosis not present

## 2016-05-17 DIAGNOSIS — R918 Other nonspecific abnormal finding of lung field: Secondary | ICD-10-CM | POA: Diagnosis not present

## 2016-05-17 DIAGNOSIS — R441 Visual hallucinations: Secondary | ICD-10-CM | POA: Diagnosis not present

## 2016-05-17 DIAGNOSIS — Z9114 Patient's other noncompliance with medication regimen: Secondary | ICD-10-CM | POA: Diagnosis not present

## 2016-05-17 DIAGNOSIS — R160 Hepatomegaly, not elsewhere classified: Secondary | ICD-10-CM | POA: Diagnosis not present

## 2016-05-17 DIAGNOSIS — I509 Heart failure, unspecified: Secondary | ICD-10-CM | POA: Diagnosis not present

## 2016-05-17 DIAGNOSIS — C799 Secondary malignant neoplasm of unspecified site: Secondary | ICD-10-CM | POA: Diagnosis not present

## 2016-05-17 DIAGNOSIS — J449 Chronic obstructive pulmonary disease, unspecified: Secondary | ICD-10-CM | POA: Diagnosis not present

## 2016-05-17 DIAGNOSIS — F329 Major depressive disorder, single episode, unspecified: Secondary | ICD-10-CM | POA: Diagnosis not present

## 2016-05-17 DIAGNOSIS — R791 Abnormal coagulation profile: Secondary | ICD-10-CM | POA: Diagnosis not present

## 2016-05-17 DIAGNOSIS — I480 Paroxysmal atrial fibrillation: Secondary | ICD-10-CM | POA: Diagnosis not present

## 2016-05-17 DIAGNOSIS — Z515 Encounter for palliative care: Secondary | ICD-10-CM | POA: Diagnosis not present

## 2016-05-17 DIAGNOSIS — C801 Malignant (primary) neoplasm, unspecified: Secondary | ICD-10-CM | POA: Diagnosis not present

## 2016-05-17 DIAGNOSIS — I4510 Unspecified right bundle-branch block: Secondary | ICD-10-CM | POA: Diagnosis not present

## 2016-05-17 DIAGNOSIS — I82403 Acute embolism and thrombosis of unspecified deep veins of lower extremity, bilateral: Secondary | ICD-10-CM | POA: Diagnosis not present

## 2016-05-17 DIAGNOSIS — Z66 Do not resuscitate: Secondary | ICD-10-CM | POA: Diagnosis not present

## 2016-05-17 DIAGNOSIS — I482 Chronic atrial fibrillation: Secondary | ICD-10-CM | POA: Diagnosis not present

## 2016-05-17 DIAGNOSIS — Z602 Problems related to living alone: Secondary | ICD-10-CM | POA: Diagnosis not present

## 2016-05-17 DIAGNOSIS — F17200 Nicotine dependence, unspecified, uncomplicated: Secondary | ICD-10-CM | POA: Diagnosis not present

## 2016-05-17 DIAGNOSIS — I739 Peripheral vascular disease, unspecified: Secondary | ICD-10-CM | POA: Diagnosis not present

## 2016-05-17 DIAGNOSIS — Z952 Presence of prosthetic heart valve: Secondary | ICD-10-CM | POA: Diagnosis not present

## 2016-05-17 DIAGNOSIS — I82402 Acute embolism and thrombosis of unspecified deep veins of left lower extremity: Secondary | ICD-10-CM | POA: Diagnosis not present

## 2016-05-17 DIAGNOSIS — R590 Localized enlarged lymph nodes: Secondary | ICD-10-CM | POA: Diagnosis not present

## 2016-05-17 DIAGNOSIS — I2510 Atherosclerotic heart disease of native coronary artery without angina pectoris: Secondary | ICD-10-CM | POA: Diagnosis not present

## 2016-05-17 DIAGNOSIS — R079 Chest pain, unspecified: Secondary | ICD-10-CM | POA: Diagnosis not present

## 2016-05-17 DIAGNOSIS — I10 Essential (primary) hypertension: Secondary | ICD-10-CM | POA: Diagnosis not present

## 2016-05-17 DIAGNOSIS — Z7901 Long term (current) use of anticoagulants: Secondary | ICD-10-CM | POA: Diagnosis not present

## 2016-05-17 DIAGNOSIS — Z7401 Bed confinement status: Secondary | ICD-10-CM | POA: Diagnosis not present

## 2016-05-17 DIAGNOSIS — R0602 Shortness of breath: Secondary | ICD-10-CM | POA: Diagnosis not present

## 2016-05-17 DIAGNOSIS — R531 Weakness: Secondary | ICD-10-CM | POA: Diagnosis not present

## 2016-05-17 DIAGNOSIS — M79662 Pain in left lower leg: Secondary | ICD-10-CM | POA: Diagnosis not present

## 2016-05-24 ENCOUNTER — Ambulatory Visit: Payer: Medicare HMO | Admitting: Urology

## 2016-05-24 DIAGNOSIS — Z5181 Encounter for therapeutic drug level monitoring: Secondary | ICD-10-CM | POA: Diagnosis not present

## 2016-05-24 DIAGNOSIS — Z79899 Other long term (current) drug therapy: Secondary | ICD-10-CM | POA: Diagnosis not present

## 2016-05-24 DIAGNOSIS — R279 Unspecified lack of coordination: Secondary | ICD-10-CM | POA: Diagnosis not present

## 2016-05-24 DIAGNOSIS — Z7401 Bed confinement status: Secondary | ICD-10-CM | POA: Diagnosis not present

## 2016-06-03 ENCOUNTER — Ambulatory Visit: Payer: Medicare HMO | Admitting: Licensed Clinical Social Worker

## 2016-06-07 ENCOUNTER — Encounter: Payer: Self-pay | Admitting: *Deleted

## 2016-06-10 ENCOUNTER — Ambulatory Visit: Payer: Commercial Managed Care - HMO | Admitting: Cardiology

## 2016-07-30 DEATH — deceased
